# Patient Record
Sex: Female | Born: 1965
Health system: Southern US, Community
[De-identification: ages and names within clinical notes are randomized; demographics above are authoritative.]

## PROBLEM LIST (undated history)

## (undated) DIAGNOSIS — E785 Hyperlipidemia, unspecified: Secondary | ICD-10-CM

## (undated) DIAGNOSIS — M75100 Unspecified rotator cuff tear or rupture of unspecified shoulder, not specified as traumatic: Secondary | ICD-10-CM

## (undated) DIAGNOSIS — M4310 Spondylolisthesis, site unspecified: Secondary | ICD-10-CM

## (undated) DIAGNOSIS — J45909 Unspecified asthma, uncomplicated: Secondary | ICD-10-CM

## (undated) DIAGNOSIS — G43909 Migraine, unspecified, not intractable, without status migrainosus: Secondary | ICD-10-CM

## (undated) HISTORY — PX: OTHER SURGICAL HISTORY: SHX169

## (undated) HISTORY — DX: Unspecified asthma, uncomplicated: J45.909

## (undated) HISTORY — DX: Hyperlipidemia, unspecified: E78.5

## (undated) HISTORY — DX: Unspecified rotator cuff tear or rupture of unspecified shoulder, not specified as traumatic: M75.100

## (undated) HISTORY — DX: Spondylolisthesis, site unspecified: M43.10

## (undated) HISTORY — PX: CARPAL TUNNEL RELEASE: SHX101

---

## 2000-01-17 ENCOUNTER — Other Ambulatory Visit: Admission: RE | Admit: 2000-01-17 | Discharge: 2000-01-17 | Payer: Self-pay | Admitting: Internal Medicine

## 2000-07-05 ENCOUNTER — Encounter: Admission: RE | Admit: 2000-07-05 | Discharge: 2000-07-05 | Payer: Self-pay | Admitting: Occupational Medicine

## 2000-07-05 ENCOUNTER — Encounter: Payer: Self-pay | Admitting: Occupational Medicine

## 2000-11-08 ENCOUNTER — Encounter: Admission: RE | Admit: 2000-11-08 | Discharge: 2000-11-21 | Payer: Self-pay | Admitting: Family Medicine

## 2002-03-12 ENCOUNTER — Encounter: Payer: Self-pay | Admitting: Family Medicine

## 2002-03-12 ENCOUNTER — Ambulatory Visit (HOSPITAL_COMMUNITY): Admission: RE | Admit: 2002-03-12 | Discharge: 2002-03-12 | Payer: Self-pay | Admitting: Family Medicine

## 2002-11-07 ENCOUNTER — Encounter: Payer: Self-pay | Admitting: Internal Medicine

## 2002-11-07 ENCOUNTER — Ambulatory Visit (HOSPITAL_COMMUNITY): Admission: RE | Admit: 2002-11-07 | Discharge: 2002-11-07 | Payer: Self-pay | Admitting: Internal Medicine

## 2003-01-01 ENCOUNTER — Encounter: Payer: Self-pay | Admitting: Family Medicine

## 2003-01-01 ENCOUNTER — Encounter: Admission: RE | Admit: 2003-01-01 | Discharge: 2003-01-01 | Payer: Self-pay | Admitting: Family Medicine

## 2003-03-16 ENCOUNTER — Other Ambulatory Visit: Admission: RE | Admit: 2003-03-16 | Discharge: 2003-03-16 | Payer: Self-pay | Admitting: Family Medicine

## 2004-03-23 ENCOUNTER — Other Ambulatory Visit: Admission: RE | Admit: 2004-03-23 | Discharge: 2004-03-23 | Payer: Self-pay | Admitting: Family Medicine

## 2005-02-10 ENCOUNTER — Emergency Department (HOSPITAL_COMMUNITY): Admission: EM | Admit: 2005-02-10 | Discharge: 2005-02-10 | Payer: Self-pay | Admitting: Emergency Medicine

## 2005-03-31 ENCOUNTER — Other Ambulatory Visit: Admission: RE | Admit: 2005-03-31 | Discharge: 2005-03-31 | Payer: Self-pay | Admitting: Family Medicine

## 2005-08-15 ENCOUNTER — Encounter: Admission: RE | Admit: 2005-08-15 | Discharge: 2005-08-15 | Payer: Self-pay | Admitting: Family Medicine

## 2007-07-19 ENCOUNTER — Encounter: Admission: RE | Admit: 2007-07-19 | Discharge: 2007-07-19 | Payer: Self-pay | Admitting: Family Medicine

## 2009-11-05 ENCOUNTER — Encounter: Admission: RE | Admit: 2009-11-05 | Discharge: 2009-11-05 | Payer: Self-pay | Admitting: Family Medicine

## 2010-10-14 NOTE — Consult Note (Signed)
Tara [name], BOSCH               ACCOUNT NO.:  [id number]   MEDICAL RECORD NO.:  [id number]          PATIENT TYPE:  EMS   LOCATION:  ED                           FACILITY:  [facility]   PHYSICIAN:  Sandria Bales. Ezzard Standing, M.D.  DATE OF BIRTH:  1965/12/19   DATE OF CONSULTATION:  02/10/2005  DATE OF DISCHARGE:                                   CONSULTATION   HISTORY OF PRESENT ILLNESS:  Tara [name] is a 45 year old female who is a  patient of Dr. Leodis Sias but last Thursday approximately September 7  developed abdominal pain which localized in Tara [name] right lower quadrant. She  went to [facility], had lab done and ultrasound which were reported to Tara [name]  negative. On September 8, she had a CT scan in which they saw an appendolith  but no obvious inflammation of the appendix. She did have some pelvic fluid.   She has had kind of grumbly pain since then. It has never gone entirely  away. It has not gotten a lot worse. She denied any fever. She has been able  to eat. She actually ate a sandwich for lunch today. She came back to see  Dr. Modesto Charon today who is worried about Tara [name] persistent abdominal pain. He did do  a gynecological exam which is reported as negative by the patient. She has  no history of peptic ulcer disease, liver disease, pancreatic disease, colon  disease. She has had no prior abdominal surgery.   ALLERGIES:  She has no allergies.   MEDICATIONS:  She is on no medications.   REVIEW OF SYSTEMS:  NEUROLOGICAL:  No history of seizure or loss of  consciousness.  PULMONARY:  No history of pneumonia or tuberculosis.  CARDIAC:  No history of heart disease or chest pain.  GASTROINTESTINAL:  See history of present illness.  UROLOGIC:  No history of kidney stones or kidney infections.  GYNECOLOGIC:  Tara [name] last menstrual period was August 20, so she is about three  weeks out from Tara [name] period. Of note, Tara [name] pain began mid-cycle!  She has two  children, a 27 year old and a 61 year old.   She works as a  Engineer, civil (consulting) at [employer]. Tara [name] husband is an  Acupuncturist.   PHYSICAL EXAMINATION:  VITAL SIGNS:  Tara [name] temperature is 99.4, blood pressure  100/62, pulse 78, respirations 18.  HEENT:  Unremarkable.  NECK:  Supple without mass or thyromegaly.  LUNGS:  Clear to auscultation.  HEART:  Has a regular rate and rhythm without murmur or rub.  ABDOMEN:  Soft. She had some mild soreness in the right lower quadrant. She  really has no guarding or rebound. She has palpable mass, no hernia. Again,  does have active bowel sounds.  GENITOURINARY:  I did not repeat a gynecological exam on Tara [name].  EXTREMITIES:  She had grossly normal four extremities.  NEUROLOGICAL:  Grossly intact.   LABORATORY DATA:  I have labs that were done by Dr. Modesto Charon which reveals a  white blood count of 5,500, hemoglobin of 13, hematocrit 38.   DIAGNOSIS:  #1.  Recurrent persistent right lower  quadrant pain. I reviewed  Tara [name] CT scan with Dr. Signa Kell. She does have an appendolith and does  have some pelvic fluid, so it is unclear to me whether the appndix is the  source for Tara [name] pain or whether there is a gynecological source for Tara [name] pain.   I discussed with the patient the options for letting Tara [name] be discharged home  with followup either with Dr. Modesto Charon or a gynecologist or to proceed with a CT  scan. She wants  to proceed with a CT scan.   Depending on the findings of the CT scan, we will dictate our next plan. If  there is clearly appendicitis, she will need an appendectomy. If there is  clearly a gynecological source for Tara [name] pain, she will need a followup with  either Dr. Modesto Charon or a gynecologist. If it is unclear, then we will have to  discuss whether it is worth proceeding with an appendectomy at this time or  not.   She understands these options.   #2.  Hypercholesterolemia. Right now, she is treating this solely with diet.  #3.  Appendolith by CT scan done September 8 at [facility].    [Tara [name] abdominal/pelvis CT scan shows no appendiceal inflammation. (read by  Dr. Gennette Pac) She still has fluid in Tara [name] pelvis and a collapsed ight ovarian  cyst!  I think Tara [name] pain is from a ruptured corpus luteum cyst.  This fits  the timing of Tara [name] pain and the nature of Tara [name] symptoms.  If the pain  continues or recurs she will see Dr. Modesto Charon or a gynecologist of Tara [name] choice.  She has my phone numbr for further general surgical quesitons.]      Sandria Bales. Ezzard Standing, M.D.  Electronically Signed     DHN/MEDQ  D:  02/10/2005  T:  02/10/2005  Job:  [id number]   cc:   Thelma Barge P. Modesto Charon, M.D.  [address]  [place]  [place] [address]  Fax: [phone]

## 2010-10-14 NOTE — Consult Note (Signed)
Tara [name], Tara [name]               ACCOUNT NO.:  [id number]   MEDICAL RECORD NO.:  [id number]          PATIENT TYPE:  EMS   LOCATION:  ED                           FACILITY:  [facility]   PHYSICIAN:  Sandria Bales. Ezzard Standing, M.D.  DATE OF BIRTH:  01-22-1966   DATE OF CONSULTATION:  02/10/2005  DATE OF DISCHARGE:                                   CONSULTATION   ADDENDUM:  On [name], I ordered a CT scan of her abdomen and pelvis.  This  shows what appears to be a collapsed cyst of her right ovary and fluid in  her pelvis.  Her appendix is absolutely normal.  She has no other area of  intra-abdominal concern which could cause pain.   I think that [name] had a ruptured corpus luteum cyst, which was probably  mid cycle, which would  be consistent with the timing of when her pain began  and  this pain should resolve.  If this is continued or repetitive, she  ought to see Dr. Modesto Charon or see a gynecologist.  If there is any concern about  doing a laparoscope, I do not mind being available at the time of  laparoscopy for further evaluation.   She understands the findings.   FOLLOW UP:  I gave her a prescription pad with my name on it and phone #.  She is to call if there are any further problems, I really think it is all a  gynecologic problem.  I think she understands that.      Sandria Bales. Ezzard Standing, M.D.  Electronically Signed     DHN/MEDQ  D:  02/10/2005  T:  02/10/2005  Job:  [id number]   cc:   Thelma Barge P. Modesto Charon, M.D.  [address]  [place]  [place] [address]  Fax: [phone]

## 2011-05-08 ENCOUNTER — Ambulatory Visit: Payer: 59 | Attending: Family Medicine | Admitting: Rehabilitation

## 2011-05-08 DIAGNOSIS — IMO0001 Reserved for inherently not codable concepts without codable children: Secondary | ICD-10-CM | POA: Insufficient documentation

## 2011-05-08 DIAGNOSIS — M25519 Pain in unspecified shoulder: Secondary | ICD-10-CM | POA: Insufficient documentation

## 2011-05-08 DIAGNOSIS — M25619 Stiffness of unspecified shoulder, not elsewhere classified: Secondary | ICD-10-CM | POA: Insufficient documentation

## 2011-05-10 ENCOUNTER — Ambulatory Visit: Payer: 59 | Admitting: Physical Therapy

## 2011-05-16 ENCOUNTER — Ambulatory Visit: Payer: 59 | Admitting: Physical Therapy

## 2011-05-19 ENCOUNTER — Ambulatory Visit: Payer: 59 | Admitting: Physical Therapy

## 2011-05-24 ENCOUNTER — Ambulatory Visit: Payer: 59 | Admitting: Physical Therapy

## 2011-05-29 ENCOUNTER — Ambulatory Visit: Payer: 59 | Admitting: Physical Therapy

## 2011-05-31 ENCOUNTER — Ambulatory Visit: Payer: 59 | Attending: Family Medicine | Admitting: Physical Therapy

## 2011-05-31 DIAGNOSIS — M25619 Stiffness of unspecified shoulder, not elsewhere classified: Secondary | ICD-10-CM | POA: Insufficient documentation

## 2011-05-31 DIAGNOSIS — M25519 Pain in unspecified shoulder: Secondary | ICD-10-CM | POA: Insufficient documentation

## 2011-05-31 DIAGNOSIS — IMO0001 Reserved for inherently not codable concepts without codable children: Secondary | ICD-10-CM | POA: Insufficient documentation

## 2011-06-05 ENCOUNTER — Ambulatory Visit: Payer: 59 | Admitting: Physical Therapy

## 2011-06-08 ENCOUNTER — Ambulatory Visit: Payer: 59 | Admitting: Physical Therapy

## 2011-06-13 ENCOUNTER — Ambulatory Visit: Payer: 59 | Admitting: Physical Therapy

## 2011-06-15 ENCOUNTER — Ambulatory Visit: Payer: 59 | Admitting: Physical Therapy

## 2011-06-19 ENCOUNTER — Ambulatory Visit: Payer: 59 | Admitting: Physical Therapy

## 2011-08-14 ENCOUNTER — Other Ambulatory Visit (HOSPITAL_BASED_OUTPATIENT_CLINIC_OR_DEPARTMENT_OTHER): Payer: Self-pay | Admitting: Family Medicine

## 2011-08-14 DIAGNOSIS — R52 Pain, unspecified: Secondary | ICD-10-CM

## 2011-08-15 ENCOUNTER — Other Ambulatory Visit (HOSPITAL_BASED_OUTPATIENT_CLINIC_OR_DEPARTMENT_OTHER): Payer: 59

## 2011-08-19 ENCOUNTER — Ambulatory Visit (HOSPITAL_BASED_OUTPATIENT_CLINIC_OR_DEPARTMENT_OTHER)
Admission: RE | Admit: 2011-08-19 | Discharge: 2011-08-19 | Disposition: A | Discharge status: Home / Self Care | Payer: 59 | Source: Ambulatory Visit | Attending: Family Medicine | Admitting: Family Medicine

## 2011-08-19 DIAGNOSIS — M75100 Unspecified rotator cuff tear or rupture of unspecified shoulder, not specified as traumatic: Secondary | ICD-10-CM | POA: Insufficient documentation

## 2011-08-19 DIAGNOSIS — IMO0002 Reserved for concepts with insufficient information to code with codable children: Secondary | ICD-10-CM | POA: Insufficient documentation

## 2011-08-19 DIAGNOSIS — R52 Pain, unspecified: Secondary | ICD-10-CM

## 2011-08-19 DIAGNOSIS — M25519 Pain in unspecified shoulder: Secondary | ICD-10-CM | POA: Insufficient documentation

## 2011-08-19 DIAGNOSIS — M25619 Stiffness of unspecified shoulder, not elsewhere classified: Secondary | ICD-10-CM | POA: Insufficient documentation

## 2011-08-19 DIAGNOSIS — M249 Joint derangement, unspecified: Secondary | ICD-10-CM | POA: Insufficient documentation

## 2011-08-19 DIAGNOSIS — M67919 Unspecified disorder of synovium and tendon, unspecified shoulder: Secondary | ICD-10-CM

## 2011-12-20 ENCOUNTER — Encounter: Payer: 59 | Attending: "Endocrinology | Admitting: *Deleted

## 2011-12-20 DIAGNOSIS — Z713 Dietary counseling and surveillance: Secondary | ICD-10-CM

## 2011-12-26 NOTE — Progress Notes (Signed)
Patient attended the [facility]: Hypertension/High Cholesterol nutrition class on 12/20/11.  Topics covered include:   1. Complications of Hyperlipidemia and/or Hypertension. 2. Ways to reduce risk of heart disease.  3. Identifying fat and sodium content on food labels. 4. Ways to decrease sodium intake. 5. Optimal amount of daily saturated fat intake. 6. Optimal amount of daily sodium intake.  7. Foods to limit/avoid on a heart healthy diet.  Patient to follow-up with [facility] prn.

## 2012-09-06 ENCOUNTER — Ambulatory Visit: Payer: Self-pay

## 2012-09-06 ENCOUNTER — Other Ambulatory Visit: Payer: Self-pay | Admitting: Occupational Medicine

## 2012-09-06 DIAGNOSIS — R7612 Nonspecific reaction to cell mediated immunity measurement of gamma interferon antigen response without active tuberculosis: Secondary | ICD-10-CM

## 2012-11-25 ENCOUNTER — Emergency Department (HOSPITAL_BASED_OUTPATIENT_CLINIC_OR_DEPARTMENT_OTHER): Payer: PRIVATE HEALTH INSURANCE

## 2012-11-25 ENCOUNTER — Emergency Department (HOSPITAL_BASED_OUTPATIENT_CLINIC_OR_DEPARTMENT_OTHER)
Admission: EM | Admit: 2012-11-25 | Discharge: 2012-11-25 | Disposition: A | Discharge status: Home / Self Care | Payer: PRIVATE HEALTH INSURANCE | Attending: Emergency Medicine | Admitting: Emergency Medicine

## 2012-11-25 ENCOUNTER — Encounter (HOSPITAL_BASED_OUTPATIENT_CLINIC_OR_DEPARTMENT_OTHER): Payer: Self-pay

## 2012-11-25 DIAGNOSIS — Y990 Civilian activity done for income or pay: Secondary | ICD-10-CM | POA: Insufficient documentation

## 2012-11-25 DIAGNOSIS — M4310 Spondylolisthesis, site unspecified: Secondary | ICD-10-CM | POA: Insufficient documentation

## 2012-11-25 DIAGNOSIS — Y9289 Other specified places as the place of occurrence of the external cause: Secondary | ICD-10-CM | POA: Insufficient documentation

## 2012-11-25 DIAGNOSIS — Z8679 Personal history of other diseases of the circulatory system: Secondary | ICD-10-CM | POA: Insufficient documentation

## 2012-11-25 DIAGNOSIS — Y93F9 Activity, other caregiving: Secondary | ICD-10-CM | POA: Insufficient documentation

## 2012-11-25 DIAGNOSIS — M549 Dorsalgia, unspecified: Secondary | ICD-10-CM

## 2012-11-25 DIAGNOSIS — IMO0002 Reserved for concepts with insufficient information to code with codable children: Secondary | ICD-10-CM | POA: Insufficient documentation

## 2012-11-25 DIAGNOSIS — X500XXA Overexertion from strenuous movement or load, initial encounter: Secondary | ICD-10-CM | POA: Insufficient documentation

## 2012-11-25 DIAGNOSIS — M4317 Spondylolisthesis, lumbosacral region: Secondary | ICD-10-CM

## 2012-11-25 DIAGNOSIS — Z79899 Other long term (current) drug therapy: Secondary | ICD-10-CM | POA: Insufficient documentation

## 2012-11-25 HISTORY — DX: Migraine, unspecified, not intractable, without status migrainosus: G43.909

## 2012-11-25 MED ORDER — IBUPROFEN 800 MG PO TABS
800.0000 mg | ORAL_TABLET | Freq: Once | ORAL | Status: AC
  Administered 2012-11-25: 800 mg via ORAL
  Filled 2012-11-25: qty 1

## 2012-11-25 MED ORDER — CYCLOBENZAPRINE HCL 10 MG PO TABS: 10.0000 mg | ORAL_TABLET | Freq: Two times a day (BID) | ORAL | Status: DC | PRN

## 2012-11-25 MED ORDER — HYDROCODONE-ACETAMINOPHEN 5-325 MG PO TABS: 2.0000 | ORAL_TABLET | ORAL | Status: DC | PRN

## 2012-11-25 MED ORDER — PREDNISONE 20 MG PO TABS: 40.0000 mg | ORAL_TABLET | Freq: Every day | ORAL | Status: DC

## 2012-11-25 NOTE — ED Notes (Signed)
Injured back at work 6/27-cont'd to work that day-was off x 2 days-relief with ibuprofen-back to work today-c/o "popping" in lower back today-pt with steady gait to tx area

## 2012-11-25 NOTE — ED Provider Notes (Signed)
History    CSN: [id number] Arrival date & time 11/25/12  1059  First MD Initiated Contact with Patient 11/25/12 1106     Chief Complaint  Patient presents with  . 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 Pt states she felt a pop on the right side of her low back and has had associated low back muscle tightness with burning radiation to right mid thigh. Patient rates her pain 6/10. Ice, ibuprofen, and stretching have mildly alleviated pain. Flexion and extension of back aggravate pain. Denies any previous history of back pain, bladder or bowel incontinence, inability to ambulate, IVDA, history of cancer, fevers or chills.   Past Medical History  Diagnosis Date  . Migraine    History reviewed. No pertinent past surgical history. No family history on file. History  Substance Use Topics  . Smoking status: Never Smoker   . Smokeless tobacco: Not on file  . Alcohol Use: No   OB History   Grav Para Term Preterm Abortions TAB SAB Ect Mult Living                 Review of Systems  Constitutional: Negative for fever and chills.  Musculoskeletal: Positive for back pain.    Allergies  Review of patient's allergies indicates no known allergies.  Home Medications   Current Outpatient Rx  Name  Route  Sig  Dispense  Refill  . Ascorbic Acid (VITAMIN C PO)   Oral   Take by mouth.         . Rizatriptan Benzoate (MAXALT PO)   Oral   Take by mouth.         . cyclobenzaprine (FLEXERIL) 10 MG tablet   Oral   Take 1 tablet (10 mg total) by mouth 2 (two) times daily as needed for muscle spasms.   20 tablet   0   . HYDROcodone-acetaminophen (NORCO/VICODIN) 5-325 MG per tablet   Oral   Take 2 tablets by mouth every 4 (four) hours as needed for pain.   10  tablet   0   . predniSONE (DELTASONE) 20 MG tablet   Oral   Take 2 tablets (40 mg total) by mouth daily.   10 tablet   0    BP 114/73  Pulse 75  Temp(Src) 98.2 F (36.8 C) (Oral)  Resp 20  Ht 5\' 4"  (1.626 m)  Wt 152 lb (68.947 kg)  BMI 26.08 kg/m2  SpO2 96%  LMP 11/17/2012 Physical Exam  Constitutional: She is oriented to person, place, and time. She appears well-developed and well-nourished. No distress.  HENT:  Head: Normocephalic and atraumatic.  Eyes: Conjunctivae are normal.  Neck: Neck supple.  Musculoskeletal:       Lumbar back: She exhibits tenderness. She exhibits normal range of motion, no bony tenderness, no swelling, no edema, no deformity, no laceration, no spasm and normal pulse.       Back:  Able to ambulate. LE strength 5/5 bilaterally. No sensory deficit. Distal pulses intact.   Neurological: She is alert and oriented to person, place, and time.  Skin: Skin is warm and dry. She is not diaphoretic.  Psychiatric: She has a normal mood and affect.    ED Course  Procedures (including critical care time) Labs Reviewed - No data to display  Dg Lumbar Spine Complete  11/25/2012   *RADIOLOGY REPORT*  Clinical Data: 47 year old female with mid to lower back pain. Recent injury.  LUMBAR SPINE - COMPLETE 4+ VIEW  Comparison: CT abdomen and pelvis 02/10/2005.  Findings: Normal lumbar segmentation. Bone mineralization is within normal limits.  No pars fracture.  Sacral ala and SI joints within normal limits.  There is trace anterolisthesis of L4 on L5 associated with moderate L4-L5 and L5-S1 facet hypertrophy.  Disc spaces relatively preserved.  Visible lower thoracic levels intact.  IMPRESSION:    1. No acute fracture or listhesis identified in the lumbar spine.  2. Trace spondylolisthesis at L4-L5 with associated lower lumbar facet degeneration.   Original Report Authenticated By: Erskine Speed, M.D.   1. Back pain   2. Spondylolisthesis at L5-S1 level     MDM   Patient with back pain.  No neurological deficits and normal neuro exam.  Patient can walk but states is painful.  No loss of bowel or bladder control.  No concern for cauda equina.  No fever, night sweats, weight loss, h/o cancer, IVDU.  Orthopedic f/u, RICE protocol and pain medicine indicated and discussed with patient. Patient is agreeable to plan. Patient is stable at time of discharge     Jeannetta Ellis, PA-C 11/25/12 1520

## 2012-11-25 NOTE — ED Notes (Signed)
Returned from xray

## 2012-11-26 NOTE — ED Provider Notes (Signed)
Medical screening examination/treatment/procedure(s) were performed by non-physician practitioner and as supervising physician I was immediately available for consultation/collaboration.  Leilanie Rauda, MD 11/26/12 2122 

## 2012-12-24 ENCOUNTER — Ambulatory Visit: Payer: PRIVATE HEALTH INSURANCE | Attending: Family Medicine | Admitting: Physical Therapy

## 2012-12-24 DIAGNOSIS — M545 Low back pain, unspecified: Secondary | ICD-10-CM | POA: Insufficient documentation

## 2012-12-24 DIAGNOSIS — IMO0001 Reserved for inherently not codable concepts without codable children: Secondary | ICD-10-CM | POA: Insufficient documentation

## 2012-12-26 ENCOUNTER — Ambulatory Visit: Payer: PRIVATE HEALTH INSURANCE | Attending: Family Medicine

## 2012-12-26 DIAGNOSIS — M545 Low back pain, unspecified: Secondary | ICD-10-CM | POA: Diagnosis not present

## 2012-12-26 DIAGNOSIS — IMO0001 Reserved for inherently not codable concepts without codable children: Secondary | ICD-10-CM | POA: Diagnosis not present

## 2012-12-30 ENCOUNTER — Ambulatory Visit: Payer: PRIVATE HEALTH INSURANCE | Attending: Family Medicine | Admitting: Physical Therapy

## 2012-12-30 DIAGNOSIS — M545 Low back pain, unspecified: Secondary | ICD-10-CM | POA: Insufficient documentation

## 2012-12-30 DIAGNOSIS — IMO0001 Reserved for inherently not codable concepts without codable children: Secondary | ICD-10-CM | POA: Insufficient documentation

## 2013-01-07 ENCOUNTER — Ambulatory Visit: Payer: PRIVATE HEALTH INSURANCE | Attending: Family Medicine | Admitting: Physical Therapy

## 2013-01-07 DIAGNOSIS — M545 Low back pain, unspecified: Secondary | ICD-10-CM | POA: Insufficient documentation

## 2013-01-07 DIAGNOSIS — IMO0001 Reserved for inherently not codable concepts without codable children: Secondary | ICD-10-CM | POA: Insufficient documentation

## 2013-01-13 ENCOUNTER — Ambulatory Visit: Payer: PRIVATE HEALTH INSURANCE | Attending: Family Medicine | Admitting: Physical Therapy

## 2013-01-13 DIAGNOSIS — M545 Low back pain, unspecified: Secondary | ICD-10-CM | POA: Insufficient documentation

## 2013-01-13 DIAGNOSIS — M4310 Spondylolisthesis, site unspecified: Secondary | ICD-10-CM | POA: Insufficient documentation

## 2013-01-13 DIAGNOSIS — IMO0001 Reserved for inherently not codable concepts without codable children: Secondary | ICD-10-CM | POA: Insufficient documentation

## 2013-01-16 ENCOUNTER — Ambulatory Visit: Payer: PRIVATE HEALTH INSURANCE | Attending: Family Medicine | Admitting: Physical Therapy

## 2013-01-16 DIAGNOSIS — M545 Low back pain, unspecified: Secondary | ICD-10-CM | POA: Insufficient documentation

## 2013-01-16 DIAGNOSIS — IMO0001 Reserved for inherently not codable concepts without codable children: Secondary | ICD-10-CM | POA: Insufficient documentation

## 2013-01-16 DIAGNOSIS — Q762 Congenital spondylolisthesis: Secondary | ICD-10-CM | POA: Insufficient documentation

## 2013-01-21 ENCOUNTER — Ambulatory Visit: Payer: PRIVATE HEALTH INSURANCE | Attending: Family Medicine | Admitting: Physical Therapy

## 2013-01-21 DIAGNOSIS — M545 Low back pain, unspecified: Secondary | ICD-10-CM | POA: Insufficient documentation

## 2013-01-21 DIAGNOSIS — IMO0001 Reserved for inherently not codable concepts without codable children: Secondary | ICD-10-CM | POA: Insufficient documentation

## 2013-01-24 ENCOUNTER — Ambulatory Visit: Payer: PRIVATE HEALTH INSURANCE | Attending: Family Medicine | Admitting: Physical Therapy

## 2013-01-24 DIAGNOSIS — IMO0001 Reserved for inherently not codable concepts without codable children: Secondary | ICD-10-CM | POA: Insufficient documentation

## 2013-01-24 DIAGNOSIS — M545 Low back pain, unspecified: Secondary | ICD-10-CM | POA: Insufficient documentation

## 2013-01-29 ENCOUNTER — Ambulatory Visit: Payer: PRIVATE HEALTH INSURANCE | Attending: Family Medicine | Admitting: Physical Therapy

## 2013-01-29 DIAGNOSIS — M545 Low back pain, unspecified: Secondary | ICD-10-CM | POA: Insufficient documentation

## 2013-01-29 DIAGNOSIS — IMO0001 Reserved for inherently not codable concepts without codable children: Secondary | ICD-10-CM | POA: Insufficient documentation

## 2013-02-06 ENCOUNTER — Ambulatory Visit: Payer: PRIVATE HEALTH INSURANCE | Admitting: Physical Therapy

## 2013-02-10 ENCOUNTER — Ambulatory Visit: Payer: PRIVATE HEALTH INSURANCE | Attending: Family Medicine | Admitting: Physical Therapy

## 2013-02-10 DIAGNOSIS — M545 Low back pain, unspecified: Secondary | ICD-10-CM | POA: Insufficient documentation

## 2013-02-10 DIAGNOSIS — IMO0001 Reserved for inherently not codable concepts without codable children: Secondary | ICD-10-CM | POA: Insufficient documentation

## 2013-02-13 ENCOUNTER — Ambulatory Visit: Payer: PRIVATE HEALTH INSURANCE | Attending: Family Medicine | Admitting: Physical Therapy

## 2013-02-13 DIAGNOSIS — M545 Low back pain, unspecified: Secondary | ICD-10-CM | POA: Insufficient documentation

## 2013-02-18 ENCOUNTER — Ambulatory Visit: Payer: PRIVATE HEALTH INSURANCE | Attending: Family Medicine | Admitting: Physical Therapy

## 2013-02-18 DIAGNOSIS — IMO0001 Reserved for inherently not codable concepts without codable children: Secondary | ICD-10-CM | POA: Insufficient documentation

## 2013-02-18 DIAGNOSIS — M545 Low back pain, unspecified: Secondary | ICD-10-CM | POA: Insufficient documentation

## 2013-02-21 ENCOUNTER — Ambulatory Visit: Payer: PRIVATE HEALTH INSURANCE | Attending: Family Medicine | Admitting: Physical Therapy

## 2013-02-21 DIAGNOSIS — IMO0001 Reserved for inherently not codable concepts without codable children: Secondary | ICD-10-CM | POA: Insufficient documentation

## 2013-02-21 DIAGNOSIS — M545 Low back pain, unspecified: Secondary | ICD-10-CM | POA: Insufficient documentation

## 2013-02-26 ENCOUNTER — Ambulatory Visit: Payer: PRIVATE HEALTH INSURANCE | Attending: Family Medicine | Admitting: Physical Therapy

## 2013-02-26 DIAGNOSIS — M545 Low back pain, unspecified: Secondary | ICD-10-CM | POA: Insufficient documentation

## 2013-02-26 DIAGNOSIS — IMO0001 Reserved for inherently not codable concepts without codable children: Secondary | ICD-10-CM | POA: Insufficient documentation

## 2013-03-05 ENCOUNTER — Ambulatory Visit: Payer: PRIVATE HEALTH INSURANCE | Attending: Family Medicine | Admitting: Physical Therapy

## 2013-03-05 ENCOUNTER — Encounter: Payer: Self-pay | Admitting: Internal Medicine

## 2013-03-05 ENCOUNTER — Ambulatory Visit (INDEPENDENT_AMBULATORY_CARE_PROVIDER_SITE_OTHER): Payer: 59 | Admitting: Internal Medicine

## 2013-03-05 ENCOUNTER — Encounter: Payer: Self-pay | Admitting: *Deleted

## 2013-03-05 VITALS — BP 111/77 | HR 81 | Temp 97.5°F | Resp 18 | Ht 64.0 in | Wt 153.0 lb

## 2013-03-05 DIAGNOSIS — M75100 Unspecified rotator cuff tear or rupture of unspecified shoulder, not specified as traumatic: Secondary | ICD-10-CM

## 2013-03-05 DIAGNOSIS — M545 Low back pain, unspecified: Secondary | ICD-10-CM | POA: Insufficient documentation

## 2013-03-05 DIAGNOSIS — E785 Hyperlipidemia, unspecified: Secondary | ICD-10-CM | POA: Insufficient documentation

## 2013-03-05 DIAGNOSIS — J45909 Unspecified asthma, uncomplicated: Secondary | ICD-10-CM

## 2013-03-05 DIAGNOSIS — IMO0001 Reserved for inherently not codable concepts without codable children: Secondary | ICD-10-CM | POA: Insufficient documentation

## 2013-03-05 DIAGNOSIS — Z8669 Personal history of other diseases of the nervous system and sense organs: Secondary | ICD-10-CM

## 2013-03-05 DIAGNOSIS — G43109 Migraine with aura, not intractable, without status migrainosus: Secondary | ICD-10-CM | POA: Insufficient documentation

## 2013-03-05 DIAGNOSIS — M67919 Unspecified disorder of synovium and tendon, unspecified shoulder: Secondary | ICD-10-CM

## 2013-03-05 NOTE — Progress Notes (Signed)
  Subjective:    Patient ID: Tara [name], female    DOB: 1965-06-10, 47 y.o.   MRN: [id number]  HPI New pt here for first visit for primary care.    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  Migraine long history of migraines worsened by OC's but now only 3-4 episodes per year.  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  I not she had a normal head CT in 2004 and she has been evaluarted years ago by [facility] in the past.  Headaches usually controlled with Maxalt for abortive treatment  Allergies  Allergen Reactions  . Tape    Past Medical History  Diagnosis Date  . Migraine    History reviewed. No pertinent past surgical history. History   Social History  . Marital Status: Married    Spouse Name: N/A    Number of Children: N/A  . Years of Education: N/A   Occupational History  . Not on file.   Social History Main Topics  . Smoking status: Never Smoker   . Smokeless tobacco: Not on file  . Alcohol Use: No  . Drug Use: No  . Sexual Activity: Not on file   Other Topics Concern  . Not on file   Social History Narrative  . No narrative on file   No family history on file. There are no active problems to display for this patient.  Current Outpatient Prescriptions on File Prior to Visit  Medication Sig Dispense Refill  . Ascorbic Acid (VITAMIN C PO) Take by mouth.      . cyclobenzaprine (FLEXERIL) 10 MG tablet Take 1 tablet (10 mg total) by mouth 2 (two) times daily as needed for muscle spasms.  20 tablet  0  . Rizatriptan Benzoate (MAXALT PO) Take by mouth.       No current facility-administered medications on file prior to visit.      Review of Systems    see HPI Objective:   Physical Exam Physical Exam  Nursing note and vitals reviewed.  Constitutional: She is oriented to person, place, and time. She appears well-developed and well-nourished.   HENT:  Head: Normocephalic and atraumatic.  Cardiovascular: Normal rate and regular rhythm. Exam reveals no gallop and no friction rub.  No murmur heard.  Pulmonary/Chest: Breath sounds normal. She has no wheezes. She has no rales.  Neurological: She is alert and oriented to person, place, and time.  Skin: Skin is warm and dry.  Psychiatric: She has a normal mood and affect. Her behavior is normal.        Assessment & Plan:  Migraines:   She is to notify me if any worsening or more frequent episodes.  She has been evaluated in the past by [facility]   Hyperlipiedemia  Will need past results.  She has a CPE scheduled in January  History of asthmatic bronchitis  Had influenza

## 2013-03-05 NOTE — Patient Instructions (Signed)
Activate my chart  Schedule CPE

## 2013-03-06 ENCOUNTER — Other Ambulatory Visit: Payer: Self-pay | Admitting: *Deleted

## 2013-03-07 ENCOUNTER — Ambulatory Visit: Payer: PRIVATE HEALTH INSURANCE | Attending: Family Medicine | Admitting: Physical Therapy

## 2013-03-07 DIAGNOSIS — M545 Low back pain, unspecified: Secondary | ICD-10-CM | POA: Insufficient documentation

## 2013-03-07 DIAGNOSIS — IMO0001 Reserved for inherently not codable concepts without codable children: Secondary | ICD-10-CM | POA: Insufficient documentation

## 2013-03-10 ENCOUNTER — Ambulatory Visit: Payer: Worker's Compensation | Admitting: Physical Therapy

## 2013-03-13 ENCOUNTER — Ambulatory Visit: Payer: PRIVATE HEALTH INSURANCE | Attending: Family Medicine | Admitting: Physical Therapy

## 2013-03-13 DIAGNOSIS — IMO0001 Reserved for inherently not codable concepts without codable children: Secondary | ICD-10-CM | POA: Insufficient documentation

## 2013-03-13 DIAGNOSIS — M545 Low back pain, unspecified: Secondary | ICD-10-CM | POA: Insufficient documentation

## 2013-03-17 ENCOUNTER — Ambulatory Visit: Payer: PRIVATE HEALTH INSURANCE | Attending: Family Medicine | Admitting: Physical Therapy

## 2013-03-17 DIAGNOSIS — M545 Low back pain, unspecified: Secondary | ICD-10-CM | POA: Insufficient documentation

## 2013-03-17 DIAGNOSIS — IMO0001 Reserved for inherently not codable concepts without codable children: Secondary | ICD-10-CM | POA: Insufficient documentation

## 2013-03-19 ENCOUNTER — Ambulatory Visit: Payer: PRIVATE HEALTH INSURANCE | Attending: Family Medicine | Admitting: Physical Therapy

## 2013-03-19 DIAGNOSIS — M545 Low back pain, unspecified: Secondary | ICD-10-CM | POA: Insufficient documentation

## 2013-03-19 DIAGNOSIS — IMO0001 Reserved for inherently not codable concepts without codable children: Secondary | ICD-10-CM | POA: Insufficient documentation

## 2013-03-25 ENCOUNTER — Ambulatory Visit: Payer: PRIVATE HEALTH INSURANCE | Attending: Family Medicine | Admitting: Physical Therapy

## 2013-03-25 DIAGNOSIS — M545 Low back pain, unspecified: Secondary | ICD-10-CM | POA: Insufficient documentation

## 2013-03-25 DIAGNOSIS — IMO0001 Reserved for inherently not codable concepts without codable children: Secondary | ICD-10-CM | POA: Insufficient documentation

## 2013-03-26 ENCOUNTER — Ambulatory Visit: Payer: PRIVATE HEALTH INSURANCE | Attending: Family Medicine | Admitting: Physical Therapy

## 2013-03-26 DIAGNOSIS — IMO0001 Reserved for inherently not codable concepts without codable children: Secondary | ICD-10-CM | POA: Insufficient documentation

## 2013-03-26 DIAGNOSIS — M545 Low back pain, unspecified: Secondary | ICD-10-CM | POA: Insufficient documentation

## 2013-03-31 ENCOUNTER — Ambulatory Visit: Payer: PRIVATE HEALTH INSURANCE | Attending: Family Medicine | Admitting: Physical Therapy

## 2013-03-31 ENCOUNTER — Other Ambulatory Visit (HOSPITAL_COMMUNITY): Payer: Self-pay | Admitting: Orthopedic Surgery

## 2013-03-31 DIAGNOSIS — M545 Low back pain, unspecified: Secondary | ICD-10-CM | POA: Insufficient documentation

## 2013-03-31 DIAGNOSIS — IMO0001 Reserved for inherently not codable concepts without codable children: Secondary | ICD-10-CM | POA: Insufficient documentation

## 2013-03-31 DIAGNOSIS — M549 Dorsalgia, unspecified: Secondary | ICD-10-CM

## 2013-04-01 ENCOUNTER — Ambulatory Visit (HOSPITAL_COMMUNITY)
Admission: RE | Admit: 2013-04-01 | Discharge: 2013-04-01 | Disposition: A | Discharge status: Home / Self Care | Payer: PRIVATE HEALTH INSURANCE | Source: Ambulatory Visit | Attending: Orthopedic Surgery | Admitting: Orthopedic Surgery

## 2013-04-01 ENCOUNTER — Other Ambulatory Visit (HOSPITAL_COMMUNITY): Payer: Self-pay | Admitting: Surgery

## 2013-04-01 DIAGNOSIS — M6740 Ganglion, unspecified site: Secondary | ICD-10-CM | POA: Insufficient documentation

## 2013-04-01 DIAGNOSIS — M549 Dorsalgia, unspecified: Secondary | ICD-10-CM

## 2013-04-01 DIAGNOSIS — M5126 Other intervertebral disc displacement, lumbar region: Secondary | ICD-10-CM | POA: Insufficient documentation

## 2013-04-01 DIAGNOSIS — M545 Low back pain, unspecified: Secondary | ICD-10-CM | POA: Insufficient documentation

## 2013-04-01 DIAGNOSIS — X500XXA Overexertion from strenuous movement or load, initial encounter: Secondary | ICD-10-CM | POA: Insufficient documentation

## 2013-04-01 DIAGNOSIS — M47817 Spondylosis without myelopathy or radiculopathy, lumbosacral region: Secondary | ICD-10-CM | POA: Insufficient documentation

## 2013-04-01 DIAGNOSIS — M79609 Pain in unspecified limb: Secondary | ICD-10-CM | POA: Insufficient documentation

## 2013-04-03 ENCOUNTER — Inpatient Hospital Stay (HOSPITAL_COMMUNITY): Admission: RE | Admit: 2013-04-03 | Payer: Self-pay | Source: Ambulatory Visit

## 2013-04-03 ENCOUNTER — Ambulatory Visit: Payer: Worker's Compensation | Admitting: Physical Therapy

## 2013-04-07 ENCOUNTER — Telehealth: Payer: Self-pay | Admitting: *Deleted

## 2013-04-07 ENCOUNTER — Ambulatory Visit: Payer: PRIVATE HEALTH INSURANCE | Attending: Family Medicine | Admitting: Physical Therapy

## 2013-04-07 DIAGNOSIS — M545 Low back pain, unspecified: Secondary | ICD-10-CM | POA: Insufficient documentation

## 2013-04-07 DIAGNOSIS — IMO0001 Reserved for inherently not codable concepts without codable children: Secondary | ICD-10-CM | POA: Insufficient documentation

## 2013-04-07 MED ORDER — FISH OIL 1000 MG PO CPDR: 2.0000 | DELAYED_RELEASE_CAPSULE | Freq: Every day | ORAL | Status: DC

## 2013-04-07 NOTE — Telephone Encounter (Signed)
Bobbie  I need to know what does she is taking and is she taking once a day or BID

## 2013-04-07 NOTE — Telephone Encounter (Signed)
Any OTC and once a day no specific brand

## 2013-04-07 NOTE — Telephone Encounter (Signed)
Pt would like a rx for fish oil so that she can use her [employer]

## 2013-04-09 ENCOUNTER — Other Ambulatory Visit: Payer: Self-pay | Admitting: *Deleted

## 2013-04-09 ENCOUNTER — Telehealth: Payer: Self-pay | Admitting: *Deleted

## 2013-04-09 NOTE — Telephone Encounter (Signed)
Tara [name] called and left a message saying that the dosage of fish oil that was called in was not the dose she needed.  She needs the smaller dose.  Please call her; she wants to know if you can change the Rx.

## 2013-04-10 ENCOUNTER — Ambulatory Visit: Payer: PRIVATE HEALTH INSURANCE | Attending: Family Medicine | Admitting: Physical Therapy

## 2013-04-10 DIAGNOSIS — IMO0001 Reserved for inherently not codable concepts without codable children: Secondary | ICD-10-CM | POA: Insufficient documentation

## 2013-04-10 DIAGNOSIS — M25519 Pain in unspecified shoulder: Secondary | ICD-10-CM | POA: Insufficient documentation

## 2013-04-14 ENCOUNTER — Other Ambulatory Visit: Payer: Self-pay | Admitting: *Deleted

## 2013-04-14 NOTE — Telephone Encounter (Signed)
Pt requesting a lower dose of fish oil and smaller cap pharmacy states that 1000mg  is standard dosage and that they can special order a lower dose. LVM message for pt to return call

## 2013-04-14 NOTE — Telephone Encounter (Signed)
Per pharmacy and pt pt is requesting Natures Made Fish oil pearls 500 mg BID. Called in this request per Dr Constance Goltz VO

## 2013-04-16 ENCOUNTER — Ambulatory Visit: Payer: PRIVATE HEALTH INSURANCE | Attending: Family Medicine | Admitting: Physical Therapy

## 2013-04-16 ENCOUNTER — Telehealth: Payer: Self-pay | Admitting: *Deleted

## 2013-04-16 DIAGNOSIS — M545 Low back pain, unspecified: Secondary | ICD-10-CM | POA: Insufficient documentation

## 2013-04-16 DIAGNOSIS — IMO0001 Reserved for inherently not codable concepts without codable children: Secondary | ICD-10-CM | POA: Insufficient documentation

## 2013-04-17 NOTE — Telephone Encounter (Signed)
Pt fish oil called in to [facility]

## 2013-04-18 ENCOUNTER — Other Ambulatory Visit: Payer: Self-pay | Admitting: *Deleted

## 2013-04-18 DIAGNOSIS — Z139 Encounter for screening, unspecified: Secondary | ICD-10-CM

## 2013-04-18 LAB — CBC WITH DIFFERENTIAL/PLATELET
Basophils Absolute: 0 10*3/uL (ref 0.0–0.1)
Basophils Relative: 1 % (ref 0–1)
Eosinophils Absolute: 0.5 10*3/uL (ref 0.0–0.7)
Eosinophils Relative: 10 % — ABNORMAL HIGH (ref 0–5)
HCT: 37.7 % (ref 36.0–46.0)
Hemoglobin: 12.5 g/dL (ref 12.0–15.0)
Lymphocytes Relative: 35 % (ref 12–46)
Lymphs Abs: 1.7 10*3/uL (ref 0.7–4.0)
MCH: 30.3 pg (ref 26.0–34.0)
MCHC: 33.2 g/dL (ref 30.0–36.0)
MCV: 91.3 fL (ref 78.0–100.0)
Monocytes Absolute: 0.3 10*3/uL (ref 0.1–1.0)
Monocytes Relative: 6 % (ref 3–12)
Neutro Abs: 2.4 10*3/uL (ref 1.7–7.7)
Neutrophils Relative %: 48 % (ref 43–77)
Platelets: 422 10*3/uL — ABNORMAL HIGH (ref 150–400)
RBC: 4.13 MIL/uL (ref 3.87–5.11)
RDW: 13.1 % (ref 11.5–15.5)
WBC: 4.9 10*3/uL (ref 4.0–10.5)

## 2013-04-18 LAB — COMPREHENSIVE METABOLIC PANEL
ALT: 24 U/L (ref 0–35)
AST: 19 U/L (ref 0–37)
Albumin: 4.3 g/dL (ref 3.5–5.2)
Alkaline Phosphatase: 43 U/L (ref 39–117)
BUN: 13 mg/dL (ref 6–23)
CO2: 26 mEq/L (ref 19–32)
Calcium: 10 mg/dL (ref 8.4–10.5)
Chloride: 102 mEq/L (ref 96–112)
Creat: 0.78 mg/dL (ref 0.50–1.10)
Glucose, Bld: 88 mg/dL (ref 70–99)
Potassium: 4.5 mEq/L (ref 3.5–5.3)
Sodium: 138 mEq/L (ref 135–145)
Total Bilirubin: 0.6 mg/dL (ref 0.3–1.2)
Total Protein: 7.1 g/dL (ref 6.0–8.3)

## 2013-04-18 LAB — LIPID PANEL
Cholesterol: 211 mg/dL — ABNORMAL HIGH (ref 0–200)
HDL: 48 mg/dL (ref >39)
LDL Cholesterol: 146 mg/dL — ABNORMAL HIGH (ref 0–99)
Total CHOL/HDL Ratio: 4.4 Ratio
Triglycerides: 85 mg/dL (ref <150)
VLDL: 17 mg/dL (ref 0–40)

## 2013-04-18 LAB — TSH: TSH: 1.719 u[IU]/mL (ref 0.350–4.500)

## 2013-04-19 LAB — VITAMIN D 25 HYDROXY (VIT D DEFICIENCY, FRACTURES): Vit D, 25-Hydroxy: 26 ng/mL — ABNORMAL LOW (ref 30–89)

## 2013-04-21 ENCOUNTER — Ambulatory Visit: Payer: PRIVATE HEALTH INSURANCE | Admitting: Physical Therapy

## 2013-04-22 ENCOUNTER — Ambulatory Visit: Payer: PRIVATE HEALTH INSURANCE | Attending: Family Medicine | Admitting: Physical Therapy

## 2013-04-22 DIAGNOSIS — IMO0001 Reserved for inherently not codable concepts without codable children: Secondary | ICD-10-CM | POA: Insufficient documentation

## 2013-04-22 DIAGNOSIS — M25519 Pain in unspecified shoulder: Secondary | ICD-10-CM | POA: Insufficient documentation

## 2013-04-23 ENCOUNTER — Ambulatory Visit: Payer: PRIVATE HEALTH INSURANCE | Admitting: Physical Therapy

## 2013-04-28 ENCOUNTER — Encounter: Payer: Self-pay | Admitting: Physical Therapy

## 2013-04-30 ENCOUNTER — Ambulatory Visit: Payer: PRIVATE HEALTH INSURANCE | Attending: Family Medicine | Admitting: Physical Therapy

## 2013-04-30 DIAGNOSIS — IMO0001 Reserved for inherently not codable concepts without codable children: Secondary | ICD-10-CM | POA: Insufficient documentation

## 2013-04-30 DIAGNOSIS — M25519 Pain in unspecified shoulder: Secondary | ICD-10-CM | POA: Insufficient documentation

## 2013-05-01 ENCOUNTER — Ambulatory Visit: Payer: PRIVATE HEALTH INSURANCE | Attending: Family Medicine | Admitting: Physical Therapy

## 2013-05-01 DIAGNOSIS — M25519 Pain in unspecified shoulder: Secondary | ICD-10-CM | POA: Diagnosis not present

## 2013-05-01 DIAGNOSIS — IMO0001 Reserved for inherently not codable concepts without codable children: Secondary | ICD-10-CM | POA: Insufficient documentation

## 2013-05-02 ENCOUNTER — Telehealth: Payer: Self-pay | Admitting: *Deleted

## 2013-05-02 NOTE — Telephone Encounter (Signed)
Pt would like for [place] to fill a form for massage therapy pt will bring form to office

## 2013-05-05 ENCOUNTER — Ambulatory Visit: Payer: PRIVATE HEALTH INSURANCE | Attending: Family Medicine | Admitting: Physical Therapy

## 2013-05-05 DIAGNOSIS — IMO0001 Reserved for inherently not codable concepts without codable children: Secondary | ICD-10-CM | POA: Insufficient documentation

## 2013-05-05 DIAGNOSIS — M545 Low back pain, unspecified: Secondary | ICD-10-CM | POA: Insufficient documentation

## 2013-05-07 ENCOUNTER — Ambulatory Visit: Payer: PRIVATE HEALTH INSURANCE | Attending: Family Medicine | Admitting: Physical Therapy

## 2013-05-07 DIAGNOSIS — IMO0001 Reserved for inherently not codable concepts without codable children: Secondary | ICD-10-CM | POA: Insufficient documentation

## 2013-05-07 DIAGNOSIS — M545 Low back pain, unspecified: Secondary | ICD-10-CM | POA: Insufficient documentation

## 2013-05-08 ENCOUNTER — Telehealth: Payer: Self-pay | Admitting: *Deleted

## 2013-05-08 NOTE — Telephone Encounter (Signed)
Letter of medical necessity mailed to pt home address

## 2013-06-19 ENCOUNTER — Ambulatory Visit (INDEPENDENT_AMBULATORY_CARE_PROVIDER_SITE_OTHER): Payer: 59 | Admitting: Internal Medicine

## 2013-06-19 ENCOUNTER — Encounter: Payer: Self-pay | Admitting: Internal Medicine

## 2013-06-19 VITALS — BP 116/73 | HR 71 | Resp 16 | Ht 64.0 in | Wt 157.0 lb

## 2013-06-19 DIAGNOSIS — N912 Amenorrhea, unspecified: Secondary | ICD-10-CM

## 2013-06-19 DIAGNOSIS — E785 Hyperlipidemia, unspecified: Secondary | ICD-10-CM

## 2013-06-19 DIAGNOSIS — Q619 Cystic kidney disease, unspecified: Secondary | ICD-10-CM

## 2013-06-19 DIAGNOSIS — Z139 Encounter for screening, unspecified: Secondary | ICD-10-CM

## 2013-06-19 DIAGNOSIS — Z0000 Encounter for general adult medical examination without abnormal findings: Secondary | ICD-10-CM

## 2013-06-19 DIAGNOSIS — N281 Cyst of kidney, acquired: Secondary | ICD-10-CM | POA: Insufficient documentation

## 2013-06-19 DIAGNOSIS — G43909 Migraine, unspecified, not intractable, without status migrainosus: Secondary | ICD-10-CM

## 2013-06-19 LAB — POCT URINALYSIS DIPSTICK
Bilirubin, UA: NEGATIVE
Blood, UA: NEGATIVE
Glucose, UA: NEGATIVE
Ketones, UA: NEGATIVE
Leukocytes, UA: NEGATIVE
Nitrite, UA: NEGATIVE
Protein, UA: NEGATIVE
Spec Grav, UA: 1.02
Urobilinogen, UA: 0.2
pH, UA: 5

## 2013-06-19 LAB — HEPATIC FUNCTION PANEL
ALT: 27 U/L (ref 0–35)
AST: 22 U/L (ref 0–37)
Albumin: 4.5 g/dL (ref 3.5–5.2)
Alkaline Phosphatase: 47 U/L (ref 39–117)
Bilirubin, Direct: 0.1 mg/dL (ref 0.0–0.3)
Indirect Bilirubin: 0.3 mg/dL (ref 0.0–0.9)
Total Bilirubin: 0.4 mg/dL (ref 0.3–1.2)
Total Protein: 7.6 g/dL (ref 6.0–8.3)

## 2013-06-19 LAB — POCT URINE PREGNANCY: Preg Test, Ur: NEGATIVE

## 2013-06-19 NOTE — Progress Notes (Signed)
Subjective:    Patient ID: Tara [name], female    DOB: [birth year], 48 y.o.   MRN: [id number]  HPI  Tara [name] is here for CPE        She reports she is having missed menses.  No hot flushes. She would like a pregnancy test.   She also reports a work related injury and is [facility]  See MRI 11/14.  Small L4-L5 disc protrusion    See lipids.  Pt reports last lipid was 177  .  She is taking red yeast rice. For the last few months  She also has 8 mm renal  Cyst in Left kidney  She does not wish a pap today She reports all her pap smear tests have been normal she had one last year at [facility].    Allergies  Allergen Reactions  . Tape    Past Medical History  Diagnosis Date  . Migraine   . Rotator cuff tear    History reviewed. No pertinent past surgical history. History   Social History  . Marital Status: Married    Spouse Name: N/A    Number of Children: N/A  . Years of Education: N/A   Occupational History  . Not on file.   Social History Main Topics  . Smoking status: Never Smoker   . Smokeless tobacco: Never Used  . Alcohol Use: No  . Drug Use: No  . Sexual Activity: Yes    Birth Control/ Protection: None   Other Topics Concern  . Not on file   Social History Narrative  . No narrative on file   Family History  Problem Relation Age of Onset  . Heart disease Father [id number]    MI  . Hyperlipidemia Brother   . Hypertension Maternal Grandmother    Patient Active Problem List   Diagnosis Date Noted  . History of migraine headaches 03/05/2013  . 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 Hyperlipidemia 03/05/2013  . 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 Rotator cuff syndrome 03/05/2013  . Low back pain 03/05/2013   Current Outpatient Prescriptions on File Prior to Visit  Medication Sig Dispense Refill  . Ascorbic Acid (VITAMIN C PO) Take by mouth.      . cyclobenzaprine (FLEXERIL) 10 MG tablet Take 1 tablet (10 mg total)  by mouth 2 (two) times daily as needed for muscle spasms.  20 tablet  0  . Rizatriptan Benzoate (MAXALT PO) Take by mouth.      . fish oil-omega-3 fatty acids 1000 MG capsule Take 1 g by mouth daily.       No current facility-administered medications on file prior to visit.      Review of Systems  All other systems reviewed and are negative.       Objective:   Physical Exam  Physical Exam  Nursing note and vitals reviewed.  Constitutional: She is oriented to person, place, and time. She appears well-developed and well-nourished.  HENT:  Head: Normocephalic and atraumatic.  Right Ear: Tympanic membrane and ear canal normal. No drainage. Tympanic membrane is not injected and not erythematous.  Left Ear: Tympanic membrane and ear canal normal. No drainage. Tympanic membrane is not injected and not erythematous.  Nose: Nose normal. Right sinus exhibits no maxillary sinus tenderness and no frontal sinus tenderness. Left sinus exhibits no maxillary sinus tenderness and no frontal sinus tenderness.  Mouth/Throat: Oropharynx is clear and moist. No oral lesions. No oropharyngeal exudate.  Eyes:  Conjunctivae and EOM are normal. Pupils are equal, round, and reactive to light.  Neck: Normal range of motion. Neck supple. No JVD present. Carotid bruit is not present. No mass and no thyromegaly present.  Cardiovascular: Normal rate, regular rhythm, S1 normal, S2 normal and intact distal pulses. Exam reveals no gallop and no friction rub.  No murmur heard.  Pulses:  Carotid pulses are 2+ on the right side, and 2+ on the left side.  Dorsalis pedis pulses are 2+ on the right side, and 2+ on the left side.  No carotid bruit. No LE edema  Pulmonary/Chest: Breath sounds normal. She has no wheezes. She has no rales. She exhibits no tenderness.   Breast no discrete masses no nipple discharge no axillary adenopathy bilaterally Abdominal: Soft. Bowel sounds are normal. She exhibits no distension and no  mass. There is no hepatosplenomegaly. There is no tenderness. There is no CVA tenderness.  Musculoskeletal: Normal range of motion.  No active synovitis to joints.  Lymphadenopathy:  She has no cervical adenopathy.  She has no axillary adenopathy.  Right: No inguinal and no supraclavicular adenopathy present.  Left: No inguinal and no supraclavicular adenopathy present.  Neurological: She is alert and oriented to person, place, and time. She has normal strength and normal reflexes. She displays no tremor. No cranial nerve deficit or sensory deficit. Coordination and gait normal.  Skin: Skin is warm and dry. No rash noted. No cyanosis. Nails show no clubbing.  Psychiatric: She has a normal mood and affect. Her speech is normal and behavior is normal. Cognition and memory are normal.         Assessment & Plan:  Health Maintenance:  Her last mm was in 2011.  Will set up at the breast center.  Advised 3D  Advised pt to send her last pap report to me as she declined pap today.  States pap was done 05/2012  Hyperlipidemia  Improved  She in on red yeast rice.  Counseled pt that red yeast rice is similar in chemical compositition to pravachol.  Will check hepatic profile today  Menstrual irregularity  Likely peri-menopausal. preg test negative  MIgraine headache continue Maxalt prn  Left renal cyst  See me as needed

## 2013-06-19 NOTE — Addendum Note (Signed)
Addended by: Raechel ChuteSCHOENHOFF, Lorenza Winkleman D on: 06/19/2013 04:18 PM   Modules accepted: Orders

## 2013-06-23 ENCOUNTER — Encounter: Payer: Self-pay | Admitting: *Deleted

## 2013-06-25 ENCOUNTER — Other Ambulatory Visit: Payer: Self-pay | Admitting: Internal Medicine

## 2013-06-25 DIAGNOSIS — Z1231 Encounter for screening mammogram for malignant neoplasm of breast: Secondary | ICD-10-CM

## 2013-06-30 ENCOUNTER — Encounter: Payer: Self-pay | Admitting: *Deleted

## 2013-07-08 ENCOUNTER — Ambulatory Visit: Payer: 59

## 2013-07-11 ENCOUNTER — Other Ambulatory Visit: Payer: Self-pay | Admitting: Internal Medicine

## 2013-07-11 ENCOUNTER — Ambulatory Visit: Admission: RE | Admit: 2013-07-11 | Payer: 59 | Source: Ambulatory Visit

## 2013-07-11 ENCOUNTER — Ambulatory Visit
Admission: RE | Admit: 2013-07-11 | Discharge: 2013-07-11 | Disposition: A | Discharge status: Home / Self Care | Payer: 59 | Source: Ambulatory Visit | Attending: Internal Medicine | Admitting: Internal Medicine

## 2013-07-11 DIAGNOSIS — Z1231 Encounter for screening mammogram for malignant neoplasm of breast: Secondary | ICD-10-CM

## 2013-07-15 ENCOUNTER — Ambulatory Visit: Payer: PRIVATE HEALTH INSURANCE

## 2013-07-18 ENCOUNTER — Ambulatory Visit: Payer: PRIVATE HEALTH INSURANCE

## 2013-08-05 ENCOUNTER — Ambulatory Visit: Payer: PRIVATE HEALTH INSURANCE | Attending: Orthopedic Surgery

## 2013-08-05 DIAGNOSIS — M545 Low back pain, unspecified: Secondary | ICD-10-CM | POA: Diagnosis not present

## 2013-08-05 DIAGNOSIS — IMO0001 Reserved for inherently not codable concepts without codable children: Secondary | ICD-10-CM | POA: Diagnosis present

## 2013-10-14 ENCOUNTER — Other Ambulatory Visit: Payer: Self-pay | Admitting: *Deleted

## 2013-10-14 NOTE — Telephone Encounter (Signed)
Refill request

## 2013-10-15 MED ORDER — FISH OIL 500 MG PO CAPS: ORAL_CAPSULE | ORAL | Status: AC

## 2014-01-12 ENCOUNTER — Telehealth: Payer: Self-pay | Admitting: *Deleted

## 2014-01-12 NOTE — Telephone Encounter (Signed)
Going on cruise and would like the patch for nausea/ motion sickness called into pharmacy.

## 2014-01-15 ENCOUNTER — Telehealth: Payer: Self-pay | Admitting: *Deleted

## 2014-01-15 ENCOUNTER — Other Ambulatory Visit: Payer: Self-pay | Admitting: Internal Medicine

## 2014-01-15 MED ORDER — SCOPOLAMINE 1 MG/3DAYS TD PT72: 1.0000 | MEDICATED_PATCH | TRANSDERMAL | Status: DC

## 2014-01-15 NOTE — Telephone Encounter (Signed)
Pt had called on 8/17( see encounter) requesting a rx for motion sickness for her & her daughter.Pt states that there must have been a mix up at Pharm cause daughter received her's but there was not 1 for her .Could you please resend?

## 2014-03-30 ENCOUNTER — Encounter: Payer: Self-pay | Admitting: Internal Medicine

## 2014-06-21 NOTE — Progress Notes (Signed)
Subjective:    Patient ID: Tara [name], female    DOB: 02/04/1966, 49 y.o.   MRN: [id number]  HPI 05/2013 note Health Maintenance: Her last mm was in 2011. Will set up at the [facility] center. Advised 3D Advised pt to send her last pap report to me as she declined pap today. States pap was done 05/2012  Hyperlipidemia Improved She in on red yeast rice. Counseled pt that red yeast rice is similar in chemical compositition to pravachol. Will check hepatic profile today  Menstrual irregularity Likely peri-menopausal. preg test negative  MIgraine headache continue Maxalt prn  Left renal cyst  See me as needed    TODAY:  Tara [name] is here for CPE HM:  Needs pap today  Mm done 06/2013,  She is non-smoker  Last menses 10/2013   Migrianes less frequent since stopping OC's   Hyperlipidemia  She has been using red yeast rice  She is on worker's comp for lumbar pain  She has seen Dr. Shon BatonBrooks orthopedic for this   Allergies  Allergen Reactions  . Tape    Past Medical History  Diagnosis Date  . Migraine   . Rotator cuff tear    No past surgical history on file. History   Social History  . Marital Status: Married    Spouse Name: N/A    Number of Children: N/A  . Years of Education: N/A   Occupational History  . Not on file.   Social History Main Topics  . Smoking status: Never Smoker   . Smokeless tobacco: Never Used  . Alcohol Use: No  . Drug Use: No  . Sexual Activity: Yes    Birth Control/ Protection: None   Other Topics Concern  . Not on file   Social History Narrative   Family History  Problem Relation Age of Onset  . Heart disease Father 2634    MI  . Hyperlipidemia Brother   . Hypertension Maternal Grandmother    Patient Active Problem List   Diagnosis Date Noted  . Renal cyst, left 06/19/2013  . History of migraine headaches 03/05/2013  . 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 Hyperlipidemia 03/05/2013  . 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  Rotator cuff syndrome 03/05/2013  . Low back pain 03/05/2013   Current Outpatient Prescriptions on File Prior to Visit  Medication Sig Dispense Refill  . Ascorbic Acid (VITAMIN C PO) Take by mouth.    . cyclobenzaprine (FLEXERIL) 10 MG tablet Take 1 tablet (10 mg total) by mouth 2 (two) times daily as needed for muscle spasms. 20 tablet 0  . Omega-3 Fatty Acids (FISH OIL) 500 MG CAPS Natures made Fish oil pearls 500mg  BID 180 capsule 1  . Red Yeast Rice 600 MG CAPS Take 600 mg by mouth daily.    . Rizatriptan Benzoate (MAXALT PO) Take by mouth.    Marland Kitchen. scopolamine (TRANSDERM-SCOP) 1 MG/3DAYS Place 1 patch (1.5 mg total) onto the skin every 3 (three) days. 3 patch 0   No current facility-administered medications on file prior to visit.      Review of Systems  Respiratory: Negative for choking, chest tightness and shortness of breath.   Cardiovascular: Negative for chest pain, palpitations and leg swelling.  All other systems reviewed and are negative.      Objective:   Physical Exam  Physical Exam  Vital signs and nursing note reviewed  Constitutional: She is oriented to person, place, and time. She appears well-developed and well-nourished. She is cooperative.  HENT:  Head: Normocephalic and atraumatic.  Right Ear: Tympanic membrane normal.  Left Ear: Tympanic membrane normal.  Nose: Nose normal.  Mouth/Throat: Oropharynx is clear and moist and mucous membranes are normal. No oropharyngeal exudate or posterior oropharyngeal erythema.  Eyes: Conjunctivae and EOM are normal. Pupils are equal, round, and reactive to light.  Neck: Neck supple. No JVD present. Carotid bruit is not present. No mass and no thyromegaly present.  Cardiovascular: Regular rhythm, normal heart sounds, intact distal pulses and normal pulses.  Exam reveals no gallop and no friction rub.   No murmur heard. Pulses:      Dorsalis pedis pulses are 2+ on the right side, and 2+ on the left side.  Pulmonary/Chest:  Breath sounds normal. She has no wheezes. She has no rhonchi. She has no rales. Right breast exhibits no mass, no nipple discharge and no skin change. Left breast exhibits no mass, no nipple discharge and no skin change.  Abdominal: Soft. Bowel sounds are normal. She exhibits no distension and no mass. There is no hepatosplenomegaly. There is no tenderness. There is no CVA tenderness.  Genitourinary: Rectum normal, vagina normal and uterus normal. Rectal exam shows no mass. Guaiac negative stool. No labial fusion. There is no lesion on the right labia. There is no lesion on the left labia. Cervix exhibits no motion tenderness. Right adnexum displays no mass, no tenderness and no fullness. Left adnexum displays no mass, no tenderness and no fullness. No erythema around the vagina.  Musculoskeletal:       No active synovitis to any joint.    Lymphadenopathy:       Right cervical: No superficial cervical adenopathy present.      Left cervical: No superficial cervical adenopathy present.       Right axillary: No pectoral and no lateral adenopathy present.       Left axillary: No pectoral and no lateral adenopathy present.      Right: No inguinal adenopathy present.       Left: No inguinal adenopathy present.  Neurological: She is alert and oriented to person, place, and time. She has normal strength and normal reflexes. No cranial nerve deficit or sensory deficit. She displays a negative Romberg sign. Coordination and gait normal.  Skin: Skin is warm and dry. No abrasion, no bruising, no ecchymosis and no rash noted. No cyanosis. Nails show no clubbing.  Psychiatric: She has a normal mood and affect. Her speech is normal and behavior is normal.          Assessment & Plan:  HM:  UTD with vaccines she is a non-smoker  Hyperlipidemia  Check today   Migraine  Less in frequency  L/s spine discomfort   Managed orthopedics and worker's comp  Peri-menopause:  Very rare flush.  Last Menses 10/2013    To lab  See me as needed       Assessment & Plan:

## 2014-06-22 ENCOUNTER — Encounter: Payer: Self-pay | Admitting: Internal Medicine

## 2014-06-22 ENCOUNTER — Ambulatory Visit (INDEPENDENT_AMBULATORY_CARE_PROVIDER_SITE_OTHER): Payer: 59 | Admitting: Internal Medicine

## 2014-06-22 ENCOUNTER — Encounter: Payer: Self-pay | Admitting: *Deleted

## 2014-06-22 VITALS — BP 125/75 | HR 80 | Resp 16 | Ht 64.0 in | Wt 160.0 lb

## 2014-06-22 DIAGNOSIS — Z1151 Encounter for screening for human papillomavirus (HPV): Secondary | ICD-10-CM

## 2014-06-22 DIAGNOSIS — Z124 Encounter for screening for malignant neoplasm of cervix: Secondary | ICD-10-CM

## 2014-06-22 DIAGNOSIS — G43109 Migraine with aura, not intractable, without status migrainosus: Secondary | ICD-10-CM

## 2014-06-22 DIAGNOSIS — Z0000 Encounter for general adult medical examination without abnormal findings: Secondary | ICD-10-CM

## 2014-06-22 DIAGNOSIS — G43909 Migraine, unspecified, not intractable, without status migrainosus: Secondary | ICD-10-CM

## 2014-06-22 DIAGNOSIS — E785 Hyperlipidemia, unspecified: Secondary | ICD-10-CM

## 2014-06-22 DIAGNOSIS — M545 Low back pain: Secondary | ICD-10-CM

## 2014-06-22 LAB — LIPID PANEL
Cholesterol: 229 mg/dL — ABNORMAL HIGH (ref 0–200)
HDL: 49 mg/dL (ref >39)
LDL Cholesterol: 152 mg/dL — ABNORMAL HIGH (ref 0–99)
Total CHOL/HDL Ratio: 4.7 Ratio
Triglycerides: 141 mg/dL (ref <150)
VLDL: 28 mg/dL (ref 0–40)

## 2014-06-22 LAB — POCT URINALYSIS DIPSTICK
Bilirubin, UA: NEGATIVE
Blood, UA: NEGATIVE
Glucose, UA: NEGATIVE
Ketones, UA: NEGATIVE
Leukocytes, UA: NEGATIVE
Nitrite, UA: NEGATIVE
Protein, UA: NEGATIVE
Spec Grav, UA: 1.02
Urobilinogen, UA: NEGATIVE
pH, UA: 6.5

## 2014-06-22 LAB — CBC WITH DIFFERENTIAL/PLATELET
Basophils Absolute: 0.1 10*3/uL (ref 0.0–0.1)
Basophils Relative: 1 % (ref 0–1)
Eosinophils Absolute: 0.5 10*3/uL (ref 0.0–0.7)
Eosinophils Relative: 8 % — ABNORMAL HIGH (ref 0–5)
HCT: 38.7 % (ref 36.0–46.0)
Hemoglobin: 13.2 g/dL (ref 12.0–15.0)
Lymphocytes Relative: 33 % (ref 12–46)
Lymphs Abs: 2 10*3/uL (ref 0.7–4.0)
MCH: 31.3 pg (ref 26.0–34.0)
MCHC: 34.1 g/dL (ref 30.0–36.0)
MCV: 91.7 fL (ref 78.0–100.0)
MPV: 9.2 fL (ref 8.6–12.4)
Monocytes Absolute: 0.3 10*3/uL (ref 0.1–1.0)
Monocytes Relative: 5 % (ref 3–12)
Neutro Abs: 3.3 10*3/uL (ref 1.7–7.7)
Neutrophils Relative %: 53 % (ref 43–77)
Platelets: 374 10*3/uL (ref 150–400)
RBC: 4.22 MIL/uL (ref 3.87–5.11)
RDW: 12.8 % (ref 11.5–15.5)
WBC: 6.2 10*3/uL (ref 4.0–10.5)

## 2014-06-22 LAB — COMPLETE METABOLIC PANEL WITH GFR
ALT: 42 U/L — ABNORMAL HIGH (ref 0–35)
AST: 32 U/L (ref 0–37)
Albumin: 4.3 g/dL (ref 3.5–5.2)
Alkaline Phosphatase: 46 U/L (ref 39–117)
BUN: 11 mg/dL (ref 6–23)
CO2: 26 mEq/L (ref 19–32)
Calcium: 10 mg/dL (ref 8.4–10.5)
Chloride: 102 mEq/L (ref 96–112)
Creat: 0.7 mg/dL (ref 0.50–1.10)
GFR, Est African American: >89 mL/min
GFR, Est Non African American: >89 mL/min
Glucose, Bld: 87 mg/dL (ref 70–99)
Potassium: 4.1 mEq/L (ref 3.5–5.3)
Sodium: 138 mEq/L (ref 135–145)
Total Bilirubin: 0.6 mg/dL (ref 0.2–1.2)
Total Protein: 7.2 g/dL (ref 6.0–8.3)

## 2014-06-22 LAB — TSH: TSH: 1.508 u[IU]/mL (ref 0.350–4.500)

## 2014-06-23 ENCOUNTER — Telehealth: Payer: Self-pay | Admitting: *Deleted

## 2014-06-23 LAB — VITAMIN D 25 HYDROXY (VIT D DEFICIENCY, FRACTURES): Vit D, 25-Hydroxy: 30 ng/mL (ref 30–100)

## 2014-06-23 LAB — CYTOLOGY - PAP

## 2014-06-23 NOTE — Telephone Encounter (Signed)
I spoke with [name] and set her up for a F/U with us- eh

## 2014-06-23 NOTE — Telephone Encounter (Signed)
-----   Message from Kendrick Rancheborah D Schoenhoff, MD sent at 06/23/2014  2:28 PM EST ----- Call pt and let her know that her bad cholesterol is higher and one of her liver tests is slighlty high  Give her Ov to see me in next 4 weeks and I will recheck  Route back with OV date   Ok to mail labs to her  thanks

## 2014-07-27 ENCOUNTER — Encounter: Payer: Self-pay | Admitting: Internal Medicine

## 2014-07-27 ENCOUNTER — Ambulatory Visit (INDEPENDENT_AMBULATORY_CARE_PROVIDER_SITE_OTHER): Payer: 59 | Admitting: Internal Medicine

## 2014-07-27 VITALS — BP 114/74 | HR 67 | Resp 16 | Ht 64.0 in | Wt 157.0 lb

## 2014-07-27 DIAGNOSIS — R748 Abnormal levels of other serum enzymes: Secondary | ICD-10-CM

## 2014-07-27 DIAGNOSIS — E785 Hyperlipidemia, unspecified: Secondary | ICD-10-CM

## 2014-07-27 NOTE — Progress Notes (Signed)
Subjective:    Patient ID: Tara [name], female    DOB: September 09, 1965, 49 y.o.   MRN: [id number]  HPI 06/22/2014 note Assessment & Plan:  HM: UTD with vaccines she is a non-smoker  Hyperlipidemia Check today   Migraine Less in frequency  L/s spine discomfort Managed orthopedics and worker's comp  Peri-menopause: Very rare flush. Last Menses 10/2013   To lab See me as needed         Today  Tara [name] is here to follow up on lab abnormalities.  See lipids and lfts  Alt slightly elevated.  After last visit pt started taking red yeast rice.  I had counseled her that red yeast rice has properties similar to statin.  When she saw labs she stopped yeast rice and has been off it for about 4 weeks.  She is asymptomatic  Lipids   Framingham profile  3.9%  FH  Father died age 54 of MI  She does not smoke no htn,  No diabetes  Allergies  Allergen Reactions  . Tape    Past Medical History  Diagnosis Date  . Migraine   . Rotator cuff tear    No past surgical history on file. History   Social History  . Marital Status: Married    Spouse Name: N/A  . Number of Children: N/A  . Years of Education: N/A   Occupational History  . Not on file.   Social History Main Topics  . Smoking status: Never Smoker   . Smokeless tobacco: Never Used  . Alcohol Use: No  . Drug Use: No  . Sexual Activity: Yes    Birth Control/ Protection: None   Other Topics Concern  . Not on file   Social History Narrative   Family History  Problem Relation Age of Onset  . Heart disease Father 2    MI  . Hyperlipidemia Brother   . Hypertension Maternal Grandmother    Patient Active Problem List   Diagnosis Date Noted  . Renal cyst, left 06/19/2013  . History of migraine headaches 03/05/2013  . 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 Hyperlipidemia 03/05/2013  . 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 Rotator cuff syndrome 03/05/2013  . Low back pain 03/05/2013   Current Outpatient  Prescriptions on File Prior to Visit  Medication Sig Dispense Refill  . Ascorbic Acid (VITAMIN C PO) Take by mouth.    . cholecalciferol (VITAMIN D) 1000 UNITS tablet Take 1,000 Units by mouth daily.    . cyclobenzaprine (FLEXERIL) 10 MG tablet Take 1 tablet (10 mg total) by mouth 2 (two) times daily as needed for muscle spasms. 20 tablet 0  . Omega-3 Fatty Acids (FISH OIL) 500 MG CAPS Natures made Fish oil pearls  BID 180 capsule 1  . Red Yeast Rice 600 MG CAPS Take 600 mg by mouth daily.    . Rizatriptan Benzoate (MAXALT PO) Take by mouth.     No current facility-administered medications on file prior to visit.       Review of Systems See HPI    Objective:   Physical Exam  Physical Exam  Nursing note and vitals reviewed.  Constitutional: She is oriented to person, place, and time. She appears well-developed and well-nourished.  HENT:  Head: Normocephalic and atraumatic.  Cardiovascular: Normal rate and regular rhythm. Exam reveals no gallop and no friction rub.  No murmur heard.  Pulmonary/Chest: Breath sounds normal. She has no wheezes. She has no rales.  Neurological: She is alert and  oriented to person, place, and time.  Skin: Skin is warm and dry.  Psychiatric: She has a normal mood and affect. Her behavior is normal.        Assessment & Plan:  Hyperlipidemia  Pt does not want any RX meds.  Will try DASH Diet  She is off the red yeast rice  Elevated alt  She has been off red yeast rice for approx 4 weeks  Will recheck today   See me in May

## 2014-07-28 ENCOUNTER — Encounter: Payer: Self-pay | Admitting: Internal Medicine

## 2014-07-28 LAB — HEPATIC FUNCTION PANEL
ALT: 21 U/L (ref 0–35)
AST: 20 U/L (ref 0–37)
Albumin: 4.6 g/dL (ref 3.5–5.2)
Alkaline Phosphatase: 50 U/L (ref 39–117)
Bilirubin, Direct: 0.1 mg/dL (ref 0.0–0.3)
Indirect Bilirubin: 0.5 mg/dL (ref 0.2–1.2)
Total Bilirubin: 0.6 mg/dL (ref 0.2–1.2)
Total Protein: 7.7 g/dL (ref 6.0–8.3)

## 2014-07-28 NOTE — Progress Notes (Signed)
Mailed a copy to patient-eh 

## 2014-09-07 ENCOUNTER — Other Ambulatory Visit: Payer: Self-pay

## 2014-09-07 DIAGNOSIS — Z1231 Encounter for screening mammogram for malignant neoplasm of breast: Secondary | ICD-10-CM

## 2014-09-11 ENCOUNTER — Ambulatory Visit
Admission: RE | Admit: 2014-09-11 | Discharge: 2014-09-11 | Disposition: A | Discharge status: Home / Self Care | Payer: 59 | Source: Ambulatory Visit

## 2014-09-11 DIAGNOSIS — Z1231 Encounter for screening mammogram for malignant neoplasm of breast: Secondary | ICD-10-CM

## 2015-05-31 DIAGNOSIS — R079 Chest pain, unspecified: Secondary | ICD-10-CM | POA: Diagnosis not present

## 2015-05-31 DIAGNOSIS — R05 Cough: Secondary | ICD-10-CM | POA: Diagnosis not present

## 2015-06-24 DIAGNOSIS — E784 Other hyperlipidemia: Secondary | ICD-10-CM | POA: Diagnosis not present

## 2015-06-24 DIAGNOSIS — I517 Cardiomegaly: Secondary | ICD-10-CM | POA: Diagnosis not present

## 2015-06-24 DIAGNOSIS — Z0000 Encounter for general adult medical examination without abnormal findings: Secondary | ICD-10-CM | POA: Diagnosis not present

## 2015-06-29 ENCOUNTER — Other Ambulatory Visit (HOSPITAL_COMMUNITY): Payer: Self-pay | Admitting: Internal Medicine

## 2015-06-29 DIAGNOSIS — I517 Cardiomegaly: Secondary | ICD-10-CM

## 2015-07-06 ENCOUNTER — Ambulatory Visit (HOSPITAL_COMMUNITY): Payer: 59 | Attending: Cardiovascular Disease

## 2015-07-06 ENCOUNTER — Other Ambulatory Visit: Payer: Self-pay

## 2015-07-06 DIAGNOSIS — I071 Rheumatic tricuspid insufficiency: Secondary | ICD-10-CM | POA: Diagnosis not present

## 2015-07-06 DIAGNOSIS — I340 Nonrheumatic mitral (valve) insufficiency: Secondary | ICD-10-CM | POA: Insufficient documentation

## 2015-07-06 DIAGNOSIS — I517 Cardiomegaly: Secondary | ICD-10-CM | POA: Insufficient documentation

## 2015-07-27 DIAGNOSIS — J309 Allergic rhinitis, unspecified: Secondary | ICD-10-CM | POA: Diagnosis not present

## 2015-07-27 DIAGNOSIS — E7800 Pure hypercholesterolemia, unspecified: Secondary | ICD-10-CM | POA: Diagnosis not present

## 2015-07-27 DIAGNOSIS — I519 Heart disease, unspecified: Secondary | ICD-10-CM | POA: Diagnosis not present

## 2015-07-27 DIAGNOSIS — R634 Abnormal weight loss: Secondary | ICD-10-CM | POA: Diagnosis not present

## 2015-09-02 ENCOUNTER — Encounter: Payer: Self-pay | Admitting: Family Medicine

## 2015-09-07 ENCOUNTER — Encounter: Payer: Self-pay | Admitting: Family Medicine

## 2015-09-14 NOTE — Progress Notes (Signed)
   Referring-Dr Constance GoltzSchoenhoff  HPI: 50 year old female for evaluation of diastolic dysfunction. Echocardiogram February 2017 interpreted as normal LV systolic function, grade 2 diastolic dysfunction, mild mitral regurgitation and tricuspid regurgitation. Patient recently had a chest x-ray and was felt to have cardiac enlargement. The above echocardiogram was ordered to further evaluate. She does not have dyspnea on exertion, orthopnea, PND, pedal edema, syncope or chest pain. She occasionally feels palpitations with activities. Because of the above we were asked to evaluate.  Current Outpatient Prescriptions  Medication Sig Dispense Refill  . Ascorbic Acid (VITAMIN C PO) Take 500 mg by mouth daily.     . cholecalciferol (VITAMIN D) 1000 UNITS tablet Take 1,000 Units by mouth daily.    . cyclobenzaprine (FLEXERIL) 10 MG tablet Take 1 tablet (10 mg total) by mouth 2 (two) times daily as needed for muscle spasms. 20 tablet 0  . Omega-3 Fatty Acids (FISH OIL) 500 MG CAPS Natures made Fish oil pearls 500mg  BID 180 capsule 1  . Rizatriptan Benzoate (MAXALT PO) Take by mouth as needed (headache).      No current facility-administered medications for this visit.    Allergies  Allergen Reactions  . Tape Rash     Past Medical History  Diagnosis Date  . Migraine   . Rotator cuff tear   . Hyperlipidemia   . Asthma     Past Surgical History  Procedure Laterality Date  . No prior surgery      Social History   Social History  . Marital Status: Married    Spouse Name: N/A  . Number of Children: 2  . Years of Education: N/A   Occupational History  . Not on file.   Social History Main Topics  . Smoking status: Never Smoker   . Smokeless tobacco: Never Used  . Alcohol Use: 0.0 oz/week    0 Standard drinks or equivalent per week     Comment: Rare  . Drug Use: No  . Sexual Activity: Yes    Birth Control/ Protection: None   Other Topics Concern  . Not on file   Social History  Narrative    Family History  Problem Relation Age of Onset  . Heart disease Father 3134    MI  . Hyperlipidemia Brother   . Hypertension Maternal Grandmother     ROS: no fevers or chills, productive cough, hemoptysis, dysphasia, odynophagia, melena, hematochezia, dysuria, hematuria, rash, seizure activity, orthopnea, PND, pedal edema, claudication. Remaining systems are negative.  Physical Exam:   Blood pressure 115/78, pulse 60, height 5\' 4"  (1.626 m), weight 149 lb (67.586 kg).  General:  Well developed/well nourished in NAD Skin warm/dry Patient not depressed No peripheral clubbing Back-normal HEENT-normal/normal eyelids Neck supple/normal carotid upstroke bilaterally; no bruits; no JVD; no thyromegaly chest - CTA/ normal expansion CV - RRR/normal S1 and S2; no murmurs, rubs or gallops;  PMI nondisplaced Abdomen -NT/ND, no HSM, no mass, + bowel sounds, no bruit 2+ femoral pulses, no bruits Ext-no edema, chords, 2+ DP Neuro-grossly nonfocal  ECG Sinus rhythm at a rate of 59.No ST changes.

## 2015-09-15 ENCOUNTER — Encounter: Payer: Self-pay | Admitting: Cardiology

## 2015-09-15 ENCOUNTER — Ambulatory Visit (INDEPENDENT_AMBULATORY_CARE_PROVIDER_SITE_OTHER): Payer: 59 | Admitting: Cardiology

## 2015-09-15 VITALS — BP 115/78 | HR 60 | Ht 64.0 in | Wt 149.0 lb

## 2015-09-15 DIAGNOSIS — E785 Hyperlipidemia, unspecified: Secondary | ICD-10-CM | POA: Diagnosis not present

## 2015-09-15 DIAGNOSIS — I5189 Other ill-defined heart diseases: Secondary | ICD-10-CM

## 2015-09-15 DIAGNOSIS — I519 Heart disease, unspecified: Secondary | ICD-10-CM

## 2015-09-15 DIAGNOSIS — I517 Cardiomegaly: Secondary | ICD-10-CM | POA: Diagnosis not present

## 2015-09-15 DIAGNOSIS — R002 Palpitations: Secondary | ICD-10-CM

## 2015-09-15 NOTE — Patient Instructions (Signed)
Your physician recommends that you schedule a follow-up appointment in: AS NEEDED  

## 2015-09-15 NOTE — Assessment & Plan Note (Signed)
I have reviewed the patient's echocardiogram personally. She does not have left ventricular hypertrophy, left atrial enlargement or signs of elevated pulmonary pressure. I believe her diastolic dysfunction is grade 1. She is not having symptoms of congestive heart failure including no dyspnea on exertion, orthopnea, PND or pedal edema. I do not believe we need to pursue further cardiac evaluation at this point.

## 2015-09-15 NOTE — Assessment & Plan Note (Signed)
Management per primary care. 

## 2015-09-15 NOTE — Assessment & Plan Note (Signed)
Patient has some palpitations with vigorous activities. No chest pain or dizziness. He discussed an exercise treadmill to further evaluate but she would prefer to avoid this for now. We will consider if she has further symptoms in the future.

## 2015-11-01 DIAGNOSIS — L0390 Cellulitis, unspecified: Secondary | ICD-10-CM | POA: Diagnosis not present

## 2015-11-03 ENCOUNTER — Other Ambulatory Visit: Payer: Self-pay | Admitting: Internal Medicine

## 2015-11-03 DIAGNOSIS — Z1231 Encounter for screening mammogram for malignant neoplasm of breast: Secondary | ICD-10-CM

## 2015-11-23 ENCOUNTER — Ambulatory Visit
Admission: RE | Admit: 2015-11-23 | Discharge: 2015-11-23 | Disposition: A | Discharge status: Home / Self Care | Payer: 59 | Source: Ambulatory Visit | Attending: Internal Medicine | Admitting: Internal Medicine

## 2015-11-23 DIAGNOSIS — Z1231 Encounter for screening mammogram for malignant neoplasm of breast: Secondary | ICD-10-CM

## 2016-05-06 DIAGNOSIS — H524 Presbyopia: Secondary | ICD-10-CM | POA: Diagnosis not present

## 2016-07-03 DIAGNOSIS — R002 Palpitations: Secondary | ICD-10-CM | POA: Diagnosis not present

## 2016-07-03 DIAGNOSIS — E7800 Pure hypercholesterolemia, unspecified: Secondary | ICD-10-CM | POA: Diagnosis not present

## 2016-07-03 DIAGNOSIS — Z0000 Encounter for general adult medical examination without abnormal findings: Secondary | ICD-10-CM | POA: Diagnosis not present

## 2016-07-03 DIAGNOSIS — I517 Cardiomegaly: Secondary | ICD-10-CM | POA: Diagnosis not present

## 2016-08-08 DIAGNOSIS — I519 Heart disease, unspecified: Secondary | ICD-10-CM | POA: Diagnosis not present

## 2016-08-08 DIAGNOSIS — E7800 Pure hypercholesterolemia, unspecified: Secondary | ICD-10-CM | POA: Diagnosis not present

## 2016-10-10 DIAGNOSIS — J111 Influenza due to unidentified influenza virus with other respiratory manifestations: Secondary | ICD-10-CM | POA: Diagnosis not present

## 2016-10-10 DIAGNOSIS — R509 Fever, unspecified: Secondary | ICD-10-CM | POA: Diagnosis not present

## 2016-11-09 DIAGNOSIS — G43109 Migraine with aura, not intractable, without status migrainosus: Secondary | ICD-10-CM | POA: Diagnosis not present

## 2016-11-13 DIAGNOSIS — R42 Dizziness and giddiness: Secondary | ICD-10-CM | POA: Diagnosis not present

## 2016-11-13 DIAGNOSIS — G43109 Migraine with aura, not intractable, without status migrainosus: Secondary | ICD-10-CM | POA: Diagnosis not present

## 2016-11-20 ENCOUNTER — Encounter: Payer: Self-pay | Admitting: Neurology

## 2016-12-26 ENCOUNTER — Telehealth: Payer: Self-pay | Admitting: Neurology

## 2016-12-26 NOTE — Telephone Encounter (Signed)
Tara [name], can you call this patient and get her on my schedule for this week please? thanks

## 2016-12-27 NOTE — Telephone Encounter (Signed)
Called pt. Left mssg and offered new patient appt w/ Dr. Lucia GaskinsAhern. She may keep previously scheduled appt w/ Dr. Karel JarvisAquino or may call back to schedule sooner appt @ [facility] if desired.

## 2016-12-27 NOTE — Telephone Encounter (Signed)
Yes please call. But mention that to her, that now she has 2 neurology appointments and maybe she wants to see Dr. Karel JarvisAquino. Her choice happy to see her this week. She can come Friday at noon

## 2016-12-28 NOTE — Telephone Encounter (Signed)
Thanks, if you don't mind give her one more call thanks

## 2016-12-28 NOTE — Telephone Encounter (Signed)
Called pt, this time at home #, and left another mssg offering new pt appt w/ Dr. Lucia GaskinsAhern. She may call back to schedule.

## 2016-12-29 NOTE — Telephone Encounter (Signed)
Pt called back and spoke to our new pt coordinator. [facility] Work-in appt scheduled for Mon.

## 2016-12-31 ENCOUNTER — Encounter: Payer: Self-pay | Admitting: Neurology

## 2017-01-01 ENCOUNTER — Ambulatory Visit (INDEPENDENT_AMBULATORY_CARE_PROVIDER_SITE_OTHER): Payer: 59 | Admitting: Neurology

## 2017-01-01 ENCOUNTER — Encounter: Payer: Self-pay | Admitting: Neurology

## 2017-01-01 VITALS — BP 96/67 | HR 65 | Ht 64.5 in | Wt 151.8 lb

## 2017-01-01 DIAGNOSIS — H547 Unspecified visual loss: Secondary | ICD-10-CM | POA: Diagnosis not present

## 2017-01-01 DIAGNOSIS — G43109 Migraine with aura, not intractable, without status migrainosus: Secondary | ICD-10-CM | POA: Diagnosis not present

## 2017-01-01 DIAGNOSIS — R51 Headache with orthostatic component, not elsewhere classified: Secondary | ICD-10-CM

## 2017-01-01 DIAGNOSIS — R4701 Aphasia: Secondary | ICD-10-CM

## 2017-01-01 DIAGNOSIS — R29818 Other symptoms and signs involving the nervous system: Secondary | ICD-10-CM | POA: Diagnosis not present

## 2017-01-01 DIAGNOSIS — R519 Headache, unspecified: Secondary | ICD-10-CM

## 2017-01-01 MED ORDER — ONDANSETRON 4 MG PO TBDP: 4.0000 mg | ORAL_TABLET | Freq: Three times a day (TID) | ORAL | 6 refills | Status: DC | PRN

## 2017-01-01 NOTE — Progress Notes (Signed)
[facility]    Provider:  Dr Lucia Gaskins Referring Provider: Kendrick Ranch, * Primary Care Physician:  Kendrick Ranch, MD  CC:  Migraines  HPI:  Tara [name] is a 51 y.o. female here as a referral from Dr. Constance Goltz for migraines. Migraines were monthly until menopause and it changed. She does not always have auras, but occasionally it looks like heat wave and has visual disturbances. Migraines are unilateral but mostly on the left with vertigo. Her last headache was concerning, she woke up with the migraine in the morning. She couldn't figure words and couldn't focus, it was so blurry she couldn't see the letters on her computer, when she was on the phone trying to get an appointment she couldn't understand what they were telling her, the headache was painful. She takes Maxalt when she feels the migraine or if she has an aura aura. Relapax helps if she catches her migraine. Symptoms lasted 2 hours. She thought she was having a stroke. She went to [place] [facility] and patient was worried she was having a stroke. Took a Relpax and slept and she took a second Relpax and it helped. Her usual migraines start more on the left side, lots of nausea, vertigo, light and souns bother her, she wants to be in a dark room. Relpax helps but they can last over 24 hours if untreated. In the last 2-3 months the frequency is worsening with vision changes and worse positionally. Phenergan and toradol helps. No other focal neurologic deficits, associated symptoms, inciting events or modifiable factors.  Tried: Imitrex, Relpax, Migrainal, maxalt.   Reviewed notes, labs and imaging from outside physicians, which showed:  CMP unremarkable, vitamin E normal, TSH normal  Reviewed CT of the head report from 2004: CLINICAL DATA:    HEADACHE AND DIZZINESS. CT OF THE HEAD WITHOUT CONTRAST NO PREVIOUS FOR COMPARISON. ROUTINE NON-CONTRAST HEAD CT WAS PERFORMED. THERE IS NO EVIDENCE OF  INTRACRANIAL HEMORRHAGE, BRAIN EDEMA, OR MASS EFFECT.  THE VENTRICLES ARE NORMAL.  NO EXTRA-AXIAL ABNORMALITIES ARE IDENTIFIED.  BONE WINDOWS SHOW NO SIGNIFICANT ABNORMALITIES. IMPRESSION NEGATIVE NON-CONTRAST HEAD CT  Reviewed notes. In 2017 patient has a palpitation with vigorous exercises been no changes in her dizziness. She declined a exercise treadmill test. Patient had an echocardiogram completed without left ventricular hypertrophy, left atrial enlargement or signs of elevated pulmonary pressure, she has diastolic dysfunction grade 1, no symptoms or signs of congestive heart failure. Patient reported lumbosacral spine discomfort and was sent to orthopedics and Workmen's Comp. ALT in the past with slightly elevated. Hyperlipidemia in the past treated with statins. She was taking Maxalt for migraine headaches in the past. Migraines are less frequent since stopping oral birth control pills. Lumbar MRI showed a small L4-L5 disc protrusion.  Review of Systems: Patient complains of symptoms per HPI as well as the following symptoms: Chest pain, no shortness of breath, no fevers, no rash, no meningismus. Pertinent negatives and positives per HPI. All others negative.   Social History   Social History  . Marital status: Married    Spouse name: N/A  . Number of children: 2  . Years of education: BSN   Occupational History  . [employer]    Social History Main Topics  . Smoking status: Never Smoker  . Smokeless tobacco: Never Used  . Alcohol use No     Comment: Rare  . Drug use: No  . Sexual activity: Yes    Birth control/ protection: None  Other Topics Concern  . Not on file   Social History Narrative   Lives at home w/ her husband and son   Right-handed   Caffeine: seldom, soda on the weekend    Family History  Problem Relation Age of Onset  . Heart disease Father 35       MI  . Hypertension Maternal Grandmother   . Hyperlipidemia Brother     Past Medical History:    Diagnosis Date  . Asthma   . Hyperlipidemia   . Migraine   . Rotator cuff tear   . Spondylolisthesis    L4-5    Past Surgical History:  Procedure Laterality Date  . No prior surgery      Current Outpatient Prescriptions  Medication Sig Dispense Refill  . cholecalciferol (VITAMIN D) 1000 UNITS tablet Take 1,000 Units by mouth daily.    . cyclobenzaprine (FLEXERIL) 10 MG tablet Take 1 tablet (10 mg total) by mouth 2 (two) times daily as needed for muscle spasms. 20 tablet 0  . Omega-3 Fatty Acids (FISH OIL) 500 MG CAPS Natures made Fish oil pearls 500mg  BID 180 capsule 1  . Probiotic Product (PROBIOTIC PO) Take by mouth daily.    . Rizatriptan Benzoate (MAXALT PO) Take by mouth as needed (headache).     . ondansetron (ZOFRAN ODT) 4 MG disintegrating tablet Take 1 tablet (4 mg total) by mouth every 8 (eight) hours as needed for nausea or vomiting. 30 tablet 6   No current facility-administered medications for this visit.     Allergies as of 01/01/2017 - Review Complete 01/01/2017  Allergen Reaction Noted  . Tape Rash 03/05/2013    Vitals: BP 96/67   Pulse 65   Ht 5' 4.5" (1.638 m)   Wt 151 lb 12.8 oz (68.9 kg)   LMP 11/24/2013   BMI 25.65 kg/m  Last Weight:  Wt Readings from Last 1 Encounters:  01/01/17 151 lb 12.8 oz (68.9 kg)   Last Height:   Ht Readings from Last 1 Encounters:  01/01/17 5' 4.5" (1.638 m)   Physical exam: Exam: Gen: NAD, conversant, well nourised, well groomed                     CV: RRR, no MRG. No Carotid Bruits. No peripheral edema, warm, nontender Eyes: Conjunctivae clear without exudates or hemorrhage  Neuro: Detailed Neurologic Exam  Speech:    Speech is normal; fluent and spontaneous with normal comprehension.  Cognition:    The patient is oriented to person, place, and time;     recent and remote memory intact;     language fluent;     normal attention, concentration,     fund of knowledge Cranial Nerves:    The pupils are  equal, round, and reactive to light. The fundi are normal and spontaneous venous pulsations are present. Visual fields are full to finger confrontation. Extraocular movements are intact. Trigeminal sensation is intact and the muscles of mastication are normal. The face is symmetric. The palate elevates in the midline. Hearing intact. Voice is normal. Shoulder shrug is normal. The tongue has normal motion without fasciculations.   Coordination:    Normal finger to nose and heel to shin. Normal rapid alternating movements.   Gait:    Heel-toe and tandem gait are normal.   Motor Observation:    No asymmetry, no atrophy, and no involuntary movements noted. Tone:    Normal muscle tone.    Posture:  Posture is normal. normal erect    Strength:    Strength is V/V in the upper and lower limbs.      Sensation: intact to LT     Reflex Exam:  DTR's:    Deep tendon reflexes in the upper and lower extremities are normal bilaterally.   Toes:    The toes are downgoing bilaterally.   Clonus:    Clonus is absent.       Assessment/Plan:  This is a 51 year old patient with episodic migraines. Patient had a severe headache with aphasia, confusion, vision loss. Headache frequency worsening with a positional quality. Needed MRI of the brain with and without contrast to evaluate for strokes, other lesions or space-occupying masses given the symptoms above.  For acute onset of migraine educated patient on acute management, take medications as soon as possible, may combine medications for better efficacy including Relpax along with Cambia and zofran  Orders Placed This Encounter  Procedures  . MR BRAIN W WO CONTRAST   Discussed: To prevent or relieve headaches, try the following: Cool Compress. Lie down and place a cool compress on your head.  Avoid headache triggers. If certain foods or odors seem to have triggered your migraines in the past, avoid them. A headache diary might help you identify  triggers.  Include physical activity in your daily routine. Try a daily walk or other moderate aerobic exercise.  Manage stress. Find healthy ways to cope with the stressors, such as delegating tasks on your to-do list.  Practice relaxation techniques. Try deep breathing, yoga, massage and visualization.  Eat regularly. Eating regularly scheduled meals and maintaining a healthy diet might help prevent headaches. Also, drink plenty of fluids.  Follow a regular sleep schedule. Sleep deprivation might contribute to headaches Consider biofeedback. With this mind-body technique, you learn to control certain bodily functions - such as muscle tension, heart rate and blood pressure - to prevent headaches or reduce headache pain.    Proceed to emergency room if you experience new or worsening symptoms or symptoms do not resolve, if you have new neurologic symptoms or if headache is severe, or for any concerning symptom.   Provided education and documentation from American headache Society toolbox including articles on: chronic migraine medication overuse headache, chronic migraines, prevention of migraines, behavioral and other nonpharmacologic treatments for headache.   Naomie DeanAntonia Shakayla Hickox, MD  [facility] [address] [place], [place] [id number]  Phone [phone] Fax [phone]

## 2017-01-01 NOTE — Patient Instructions (Signed)
At onset of migraine or for severe migraine take: Relpax + Zofran + Cambia. At work can try Cambia alone with zofran.     Diclofenac powder for oral solution What is this medicine? DICLOFENAC (dye KLOE fen ak) is a non-steroidal anti-inflammatory drug (NSAID). It is used to treat migraine pain. This medicine may be used for other purposes; ask your health care provider or pharmacist if you have questions. COMMON BRAND NAME(S): Cambia What should I tell my health care provider before I take this medicine? They need to know if you have any of these conditions: -asthma, especially aspirin sensitive asthma -coronary artery bypass graft (CABG) surgery within the past 2 weeks -drink more than 3 alcohol-containing drinks a day -heart disease or circulation problems like heart failure or leg edema (fluid retention) -high blood pressure -kidney disease -liver disease -phenylketonuria -stomach problems -an unusual or allergic reaction to diclofenac, aspirin, other NSAIDs, other medicines, foods, dyes, or preservatives -pregnant or trying to get pregnant -breast-feeding How should I use this medicine? Mix this medicine with 1 to 2 ounces of water. Drink the medicine and water together. Follow the directions on the prescription label. Do not take your medicine more often than directed. Long-term, continuous use may increase the risk of heart attack or stroke. A special MedGuide will be given to you by the pharmacist with each prescription and refill. Be sure to read this information carefully each time. Talk to your pediatrician regarding the use of this medicine in children. Special care may be needed. Elderly patients over 19 years old may have a stronger reaction and need a smaller dose. Overdosage: If you think you have taken too much of this medicine contact a poison control center or emergency room at once. NOTE: This medicine is only for you. Do not share this medicine with others. What if  I miss a dose? This does not apply. What may interact with this medicine? Do not take this medicine with any of the following medications: -cidofovir -ketorolac -methotrexate This medicine may also interact with the following medications: -alcohol -aspirin and aspirin-like medicines -cyclosporine -diuretics -lithium -medicines for blood pressure -medicines for osteoporosis -medicines that affect platelets -medicines that treat or prevent blood clots like warfarin -NSAIDs, medicines for pain and inflammation, like ibuprofen or naproxen -pemetrexed -steroid medicines like prednisone or cortisone This list may not describe all possible interactions. Give your health care provider a list of all the medicines, herbs, non-prescription drugs, or dietary supplements you use. Also tell them if you smoke, drink alcohol, or use illegal drugs. Some items may interact with your medicine. What should I watch for while using this medicine? Tell your doctor or health care professional if your pain does not get better. Talk to your doctor before taking another medicine for pain. Do not treat yourself. This medicine does not prevent heart attack or stroke. In fact, this medicine may increase the chance of a heart attack or stroke. The chance may increase with longer use of this medicine and in people who have heart disease. If you take aspirin to prevent heart attack or stroke, talk with your doctor or health care professional. Do not take medicines such as ibuprofen and naproxen with this medicine. Side effects such as stomach upset, nausea, or ulcers may be more likely to occur. Many medicines available without a prescription should not be taken with this medicine. This medicine can cause ulcers and bleeding in the stomach and intestines at any time during treatment. Do not smoke  cigarettes or drink alcohol. These increase irritation to your stomach and can make it more susceptible to damage from this  medicine. Ulcers and bleeding can happen without warning symptoms and can cause death. You may get drowsy or dizzy. Do not drive, use machinery, or do anything that needs mental alertness until you know how this medicine affects you. Do not stand or sit up quickly, especially if you are an older patient. This reduces the risk of dizzy or fainting spells. This medicine can cause you to bleed more easily. Try to avoid damage to your teeth and gums when you brush or floss your teeth. If you take migraine medicines for 10 or more days a month, your migraines may get worse. Keep a diary of headache days and medicine use. Contact your healthcare professional if your migraine attacks occur more frequently. What side effects may I notice from receiving this medicine? Side effects that you should report to your doctor or health care professional as soon as possible: -allergic reactions like skin rash, itching or hives, swelling of the face, lips, or tongue -black or bloody stools, blood in the urine or vomit -blurred vision -chest pain -difficulty breathing or wheezing -nausea or vomiting -fever -redness, blistering, peeling or loosening of the skin, including inside the mouth -slurred speech or weakness on one side of the body -trouble passing urine or change in the amount of urine -unexplained weight gain or swelling -unusually weak or tired -yellowing of eyes or skin Side effects that usually do not require medical attention (report to your doctor or health care professional if they continue or are bothersome): -constipation -diarrhea -dizziness -headache -heartburn This list may not describe all possible side effects. Call your doctor for medical advice about side effects. You may report side effects to FDA at 1-800-FDA-1088. Where should I keep my medicine? Keep out of the reach of children. Store at room temperature between 15 and 30 degrees C (59 and 86 degrees F). Throw away any unused  medicine after the expiration date. NOTE: This sheet is a summary. It may not cover all possible information. If you have questions about this medicine, talk to your doctor, pharmacist, or health care provider.  2018 Elsevier/Gold Standard (2015-06-17 09:56:49)    Ondansetron oral dissolving tablet What is this medicine? ONDANSETRON (on DAN se tron) is used to treat nausea and vomiting caused by chemotherapy. It is also used to prevent or treat nausea and vomiting after surgery. This medicine may be used for other purposes; ask your health care provider or pharmacist if you have questions. COMMON BRAND NAME(S): Zofran ODT What should I tell my health care provider before I take this medicine? They need to know if you have any of these conditions: -heart disease -history of irregular heartbeat -liver disease -low levels of magnesium or potassium in the blood -an unusual or allergic reaction to ondansetron, granisetron, other medicines, foods, dyes, or preservatives -pregnant or trying to get pregnant -breast-feeding How should I use this medicine? These tablets are made to dissolve in the mouth. Do not try to push the tablet through the foil backing. With dry hands, peel away the foil backing and gently remove the tablet. Place the tablet in the mouth and allow it to dissolve, then swallow. While you may take these tablets with water, it is not necessary to do so. Talk to your pediatrician regarding the use of this medicine in children. Special care may be needed. Overdosage: If you think you have taken too  much of this medicine contact a poison control center or emergency room at once. NOTE: This medicine is only for you. Do not share this medicine with others. What if I miss a dose? If you miss a dose, take it as soon as you can. If it is almost time for your next dose, take only that dose. Do not take double or extra doses. What may interact with this medicine? Do not take this  medicine with any of the following medications: -apomorphine -certain medicines for fungal infections like fluconazole, itraconazole, ketoconazole, posaconazole, voriconazole -cisapride -dofetilide -dronedarone -pimozide -thioridazine -ziprasidone This medicine may also interact with the following medications: -carbamazepine -certain medicines for depression, anxiety, or psychotic disturbances -fentanyl -linezolid -MAOIs like Carbex, Eldepryl, Marplan, Nardil, and Parnate -methylene blue (injected into a vein) -other medicines that prolong the QT interval (cause an abnormal heart rhythm) -phenytoin -rifampicin -tramadol This list may not describe all possible interactions. Give your health care provider a list of all the medicines, herbs, non-prescription drugs, or dietary supplements you use. Also tell them if you smoke, drink alcohol, or use illegal drugs. Some items may interact with your medicine. What should I watch for while using this medicine? Check with your doctor or health care professional as soon as you can if you have any sign of an allergic reaction. What side effects may I notice from receiving this medicine? Side effects that you should report to your doctor or health care professional as soon as possible: -allergic reactions like skin rash, itching or hives, swelling of the face, lips, or tongue -breathing problems -confusion -dizziness -fast or irregular heartbeat -feeling faint or lightheaded, falls -fever and chills -loss of balance or coordination -seizures -sweating -swelling of the hands and feet -tightness in the chest -tremors -unusually weak or tired Side effects that usually do not require medical attention (report to your doctor or health care professional if they continue or are bothersome): -constipation or diarrhea -headache This list may not describe all possible side effects. Call your doctor for medical advice about side effects. You may  report side effects to FDA at 1-800-FDA-1088. Where should I keep my medicine? Keep out of the reach of children. Store between 2 and 30 degrees C (36 and 86 degrees F). Throw away any unused medicine after the expiration date. NOTE: This sheet is a summary. It may not cover all possible information. If you have questions about this medicine, talk to your doctor, pharmacist, or health care provider.  2018 Elsevier/Gold Standard (2013-02-19 16:21:52)

## 2017-01-09 ENCOUNTER — Ambulatory Visit: Payer: Self-pay | Admitting: Neurology

## 2017-02-13 DIAGNOSIS — R42 Dizziness and giddiness: Secondary | ICD-10-CM | POA: Diagnosis not present

## 2017-02-13 DIAGNOSIS — E7800 Pure hypercholesterolemia, unspecified: Secondary | ICD-10-CM | POA: Diagnosis not present

## 2017-02-13 DIAGNOSIS — G43109 Migraine with aura, not intractable, without status migrainosus: Secondary | ICD-10-CM | POA: Diagnosis not present

## 2017-03-20 MED FILL — ROSUVASTATIN CALCIUM 10 MG: 10 | 28 days supply | Qty: 12 | Fill #0

## 2017-04-02 ENCOUNTER — Ambulatory Visit: Payer: Self-pay

## 2017-04-02 ENCOUNTER — Other Ambulatory Visit: Payer: Self-pay | Admitting: Occupational Medicine

## 2017-04-02 DIAGNOSIS — M25531 Pain in right wrist: Secondary | ICD-10-CM

## 2017-04-02 DIAGNOSIS — M25561 Pain in right knee: Secondary | ICD-10-CM

## 2017-04-02 DIAGNOSIS — M25571 Pain in right ankle and joints of right foot: Secondary | ICD-10-CM

## 2017-04-09 DIAGNOSIS — E7800 Pure hypercholesterolemia, unspecified: Secondary | ICD-10-CM | POA: Diagnosis not present

## 2017-04-09 DIAGNOSIS — G43109 Migraine with aura, not intractable, without status migrainosus: Secondary | ICD-10-CM | POA: Diagnosis not present

## 2017-04-12 MED FILL — ROSUVASTATIN CALCIUM 10 MG: 10 | 28 days supply | Qty: 12 | Fill #0

## 2017-05-07 MED FILL — ROSUVASTATIN CALCIUM 10 MG: 10 | 84 days supply | Qty: 36 | Fill #1

## 2017-07-03 ENCOUNTER — Other Ambulatory Visit (HOSPITAL_COMMUNITY)
Admission: RE | Admit: 2017-07-03 | Discharge: 2017-07-03 | Disposition: A | Discharge status: Home / Self Care | Payer: 59 | Source: Ambulatory Visit | Attending: Internal Medicine | Admitting: Internal Medicine

## 2017-07-03 ENCOUNTER — Other Ambulatory Visit: Payer: Self-pay | Admitting: Internal Medicine

## 2017-07-03 DIAGNOSIS — E7800 Pure hypercholesterolemia, unspecified: Secondary | ICD-10-CM | POA: Diagnosis not present

## 2017-07-03 DIAGNOSIS — Z124 Encounter for screening for malignant neoplasm of cervix: Secondary | ICD-10-CM | POA: Diagnosis not present

## 2017-07-03 DIAGNOSIS — G43909 Migraine, unspecified, not intractable, without status migrainosus: Secondary | ICD-10-CM | POA: Diagnosis not present

## 2017-07-03 DIAGNOSIS — Z01419 Encounter for gynecological examination (general) (routine) without abnormal findings: Secondary | ICD-10-CM | POA: Diagnosis not present

## 2017-07-03 DIAGNOSIS — Z0000 Encounter for general adult medical examination without abnormal findings: Secondary | ICD-10-CM | POA: Diagnosis not present

## 2017-07-06 LAB — CYTOLOGY - PAP
Diagnosis: NEGATIVE
HPV: NOT DETECTED

## 2017-07-11 ENCOUNTER — Other Ambulatory Visit: Payer: Self-pay | Admitting: Internal Medicine

## 2017-07-11 DIAGNOSIS — Z1231 Encounter for screening mammogram for malignant neoplasm of breast: Secondary | ICD-10-CM

## 2017-07-23 ENCOUNTER — Telehealth: Payer: Self-pay | Admitting: Internal Medicine

## 2017-07-23 NOTE — Telephone Encounter (Signed)
Received incoming records from [facility] for upcoming appointment on 09/19/17 @ 11:45am with Dr. Rennis GoldenHilty. Records located in Medical Records. 07/23/17 ab

## 2017-07-30 ENCOUNTER — Ambulatory Visit: Payer: 59 | Admitting: Internal Medicine

## 2017-08-07 ENCOUNTER — Ambulatory Visit
Admission: RE | Admit: 2017-08-07 | Discharge: 2017-08-07 | Disposition: A | Discharge status: Home / Self Care | Payer: 59 | Source: Ambulatory Visit | Attending: Internal Medicine | Admitting: Internal Medicine

## 2017-08-07 DIAGNOSIS — Z1231 Encounter for screening mammogram for malignant neoplasm of breast: Secondary | ICD-10-CM

## 2017-08-08 MED FILL — ROSUVASTATIN CALCIUM 10 MG: 10 | 84 days supply | Qty: 36 | Fill #2

## 2017-08-23 DIAGNOSIS — M5416 Radiculopathy, lumbar region: Secondary | ICD-10-CM | POA: Diagnosis not present

## 2017-08-23 DIAGNOSIS — M545 Low back pain: Secondary | ICD-10-CM | POA: Diagnosis not present

## 2017-09-02 DIAGNOSIS — H524 Presbyopia: Secondary | ICD-10-CM | POA: Diagnosis not present

## 2017-09-19 ENCOUNTER — Ambulatory Visit: Payer: 59 | Admitting: Internal Medicine

## 2017-10-19 MED FILL — ROSUVASTATIN CALCIUM 10 MG: 10 | 84 days supply | Qty: 36 | Fill #3

## 2018-01-25 MED FILL — ROSUVASTATIN CALCIUM 10 MG: 10 | 84 days supply | Qty: 36 | Fill #4

## 2018-03-05 DIAGNOSIS — Z1211 Encounter for screening for malignant neoplasm of colon: Secondary | ICD-10-CM | POA: Diagnosis not present

## 2018-04-09 MED FILL — ROSUVASTATIN CALCIUM 10 MG: 10 | 9 days supply | Qty: 4 | Fill #5

## 2018-04-17 MED FILL — ROSUVASTATIN CALCIUM 10 MG: 10 | 28 days supply | Qty: 12 | Fill #0

## 2018-04-17 MED FILL — GAVILYTE-G SOLUTION: 236 | 1 days supply | Qty: 4000 | Fill #0

## 2018-04-19 DIAGNOSIS — Z1211 Encounter for screening for malignant neoplasm of colon: Secondary | ICD-10-CM | POA: Diagnosis not present

## 2018-05-15 MED FILL — SCOPOLAMINE 1 MG/3DAYS PT72: 1 | 7 days supply | Qty: 2 | Fill #0

## 2018-05-21 MED FILL — ROSUVASTATIN CALCIUM 10 MG: 10 | 28 days supply | Qty: 12 | Fill #1

## 2018-06-14 MED FILL — ROSUVASTATIN CALCIUM 10 MG: 10 | 84 days supply | Qty: 36 | Fill #2

## 2018-07-03 DIAGNOSIS — E7800 Pure hypercholesterolemia, unspecified: Secondary | ICD-10-CM | POA: Diagnosis not present

## 2018-07-03 DIAGNOSIS — G43109 Migraine with aura, not intractable, without status migrainosus: Secondary | ICD-10-CM | POA: Diagnosis not present

## 2018-07-03 DIAGNOSIS — Z0000 Encounter for general adult medical examination without abnormal findings: Secondary | ICD-10-CM | POA: Diagnosis not present

## 2018-09-05 MED FILL — ROSUVASTATIN CALCIUM 10 MG: 10 | 84 days supply | Qty: 36 | Fill #0

## 2018-12-06 MED FILL — ROSUVASTATIN CALCIUM 10 MG: 10 | 84 days supply | Qty: 36 | Fill #1

## 2019-01-14 DIAGNOSIS — L304 Erythema intertrigo: Secondary | ICD-10-CM | POA: Diagnosis not present

## 2019-01-14 DIAGNOSIS — E7800 Pure hypercholesterolemia, unspecified: Secondary | ICD-10-CM | POA: Diagnosis not present

## 2019-01-14 DIAGNOSIS — E559 Vitamin D deficiency, unspecified: Secondary | ICD-10-CM | POA: Diagnosis not present

## 2019-01-14 DIAGNOSIS — G43109 Migraine with aura, not intractable, without status migrainosus: Secondary | ICD-10-CM | POA: Diagnosis not present

## 2019-01-14 DIAGNOSIS — R000 Tachycardia, unspecified: Secondary | ICD-10-CM | POA: Diagnosis not present

## 2019-02-24 DIAGNOSIS — R21 Rash and other nonspecific skin eruption: Secondary | ICD-10-CM | POA: Diagnosis not present

## 2019-02-24 DIAGNOSIS — R509 Fever, unspecified: Secondary | ICD-10-CM | POA: Diagnosis not present

## 2019-02-24 MED FILL — predniSONE 10 MG (21) TBPK: 10 | 6 days supply | Qty: 21 | Fill #0

## 2019-02-25 ENCOUNTER — Other Ambulatory Visit: Payer: Self-pay

## 2019-02-25 DIAGNOSIS — Z20822 Contact with and (suspected) exposure to covid-19: Secondary | ICD-10-CM

## 2019-02-26 LAB — NOVEL CORONAVIRUS, NAA: SARS-CoV-2, NAA: NOT DETECTED

## 2019-03-07 DIAGNOSIS — B372 Candidiasis of skin and nail: Secondary | ICD-10-CM | POA: Diagnosis not present

## 2019-03-08 MED FILL — FLUCONAZOLE 150 MG TABS: 150 | 2 days supply | Qty: 2 | Fill #0

## 2019-03-08 MED FILL — FLUARIX QUADRIVALENT 0.5 ML: 0.5 | 1 days supply | Qty: 1 | Fill #0

## 2019-03-10 MED FILL — ROSUVASTATIN CALCIUM 10 MG: 10 | 84 days supply | Qty: 36 | Fill #0

## 2019-05-04 DIAGNOSIS — H524 Presbyopia: Secondary | ICD-10-CM | POA: Diagnosis not present

## 2019-06-03 MED FILL — ROSUVASTATIN CALCIUM 10 MG: 10 | 84 days supply | Qty: 36 | Fill #1

## 2019-07-29 ENCOUNTER — Encounter (HOSPITAL_COMMUNITY): Payer: Self-pay | Admitting: Emergency Medicine

## 2019-07-29 ENCOUNTER — Observation Stay (HOSPITAL_COMMUNITY): Payer: 59

## 2019-07-29 ENCOUNTER — Inpatient Hospital Stay (HOSPITAL_COMMUNITY)
Admission: EM | Admit: 2019-07-29 | Discharge: 2019-07-30 | DRG: 041 | Disposition: A | Discharge status: Home / Self Care | Payer: 59 | Attending: Internal Medicine | Admitting: Internal Medicine

## 2019-07-29 ENCOUNTER — Emergency Department (HOSPITAL_COMMUNITY): Payer: 59

## 2019-07-29 ENCOUNTER — Observation Stay (HOSPITAL_COMMUNITY)
Admit: 2019-07-29 | Discharge: 2019-07-29 | Disposition: A | Discharge status: Home / Self Care | Payer: 59 | Attending: Nurse Practitioner | Admitting: Nurse Practitioner

## 2019-07-29 ENCOUNTER — Other Ambulatory Visit: Payer: Self-pay

## 2019-07-29 DIAGNOSIS — R1310 Dysphagia, unspecified: Secondary | ICD-10-CM

## 2019-07-29 DIAGNOSIS — Q211 Atrial septal defect: Secondary | ICD-10-CM | POA: Diagnosis not present

## 2019-07-29 DIAGNOSIS — I639 Cerebral infarction, unspecified: Secondary | ICD-10-CM

## 2019-07-29 DIAGNOSIS — I6340 Cerebral infarction due to embolism of unspecified cerebral artery: Secondary | ICD-10-CM | POA: Diagnosis present

## 2019-07-29 DIAGNOSIS — I739 Peripheral vascular disease, unspecified: Secondary | ICD-10-CM | POA: Diagnosis present

## 2019-07-29 DIAGNOSIS — R29702 NIHSS score 2: Secondary | ICD-10-CM | POA: Diagnosis present

## 2019-07-29 DIAGNOSIS — Z20822 Contact with and (suspected) exposure to covid-19: Secondary | ICD-10-CM | POA: Diagnosis present

## 2019-07-29 DIAGNOSIS — Z8669 Personal history of other diseases of the nervous system and sense organs: Secondary | ICD-10-CM | POA: Diagnosis not present

## 2019-07-29 DIAGNOSIS — R2990 Unspecified symptoms and signs involving the nervous system: Secondary | ICD-10-CM | POA: Diagnosis not present

## 2019-07-29 DIAGNOSIS — R739 Hyperglycemia, unspecified: Secondary | ICD-10-CM | POA: Diagnosis present

## 2019-07-29 DIAGNOSIS — J45909 Unspecified asthma, uncomplicated: Secondary | ICD-10-CM | POA: Diagnosis not present

## 2019-07-29 DIAGNOSIS — Z823 Family history of stroke: Secondary | ICD-10-CM

## 2019-07-29 DIAGNOSIS — I6389 Other cerebral infarction: Secondary | ICD-10-CM

## 2019-07-29 DIAGNOSIS — G8194 Hemiplegia, unspecified affecting left nondominant side: Secondary | ICD-10-CM | POA: Diagnosis present

## 2019-07-29 DIAGNOSIS — R29818 Other symptoms and signs involving the nervous system: Secondary | ICD-10-CM | POA: Diagnosis not present

## 2019-07-29 DIAGNOSIS — R29898 Other symptoms and signs involving the musculoskeletal system: Secondary | ICD-10-CM | POA: Diagnosis present

## 2019-07-29 DIAGNOSIS — Z6828 Body mass index (BMI) 28.0-28.9, adult: Secondary | ICD-10-CM

## 2019-07-29 DIAGNOSIS — E7800 Pure hypercholesterolemia, unspecified: Secondary | ICD-10-CM | POA: Diagnosis present

## 2019-07-29 DIAGNOSIS — E663 Overweight: Secondary | ICD-10-CM | POA: Diagnosis present

## 2019-07-29 DIAGNOSIS — E785 Hyperlipidemia, unspecified: Secondary | ICD-10-CM | POA: Diagnosis not present

## 2019-07-29 DIAGNOSIS — Z8249 Family history of ischemic heart disease and other diseases of the circulatory system: Secondary | ICD-10-CM | POA: Diagnosis not present

## 2019-07-29 DIAGNOSIS — R002 Palpitations: Secondary | ICD-10-CM | POA: Diagnosis present

## 2019-07-29 DIAGNOSIS — I340 Nonrheumatic mitral (valve) insufficiency: Secondary | ICD-10-CM | POA: Diagnosis not present

## 2019-07-29 DIAGNOSIS — R4701 Aphasia: Secondary | ICD-10-CM | POA: Diagnosis not present

## 2019-07-29 DIAGNOSIS — Z03818 Encounter for observation for suspected exposure to other biological agents ruled out: Secondary | ICD-10-CM | POA: Diagnosis not present

## 2019-07-29 DIAGNOSIS — I5189 Other ill-defined heart diseases: Secondary | ICD-10-CM

## 2019-07-29 DIAGNOSIS — G459 Transient cerebral ischemic attack, unspecified: Secondary | ICD-10-CM | POA: Diagnosis not present

## 2019-07-29 DIAGNOSIS — R531 Weakness: Secondary | ICD-10-CM | POA: Diagnosis not present

## 2019-07-29 DIAGNOSIS — Z79899 Other long term (current) drug therapy: Secondary | ICD-10-CM | POA: Diagnosis not present

## 2019-07-29 LAB — DIFFERENTIAL
Abs Immature Granulocytes: 0.02 10*3/uL (ref 0.00–0.07)
Basophils Absolute: 0.1 10*3/uL (ref 0.0–0.1)
Basophils Relative: 1 %
Eosinophils Absolute: 0.5 10*3/uL (ref 0.0–0.5)
Eosinophils Relative: 7 %
Immature Granulocytes: 0 %
Lymphocytes Relative: 50 %
Lymphs Abs: 4.2 10*3/uL — ABNORMAL HIGH (ref 0.7–4.0)
Monocytes Absolute: 0.6 10*3/uL (ref 0.1–1.0)
Monocytes Relative: 7 %
Neutro Abs: 3 10*3/uL (ref 1.7–7.7)
Neutrophils Relative %: 35 %

## 2019-07-29 LAB — COMPREHENSIVE METABOLIC PANEL
ALT: 27 U/L (ref 0–44)
AST: 21 U/L (ref 15–41)
Albumin: 4 g/dL (ref 3.5–5.0)
Alkaline Phosphatase: 52 U/L (ref 38–126)
Anion gap: 9 (ref 5–15)
BUN: 15 mg/dL (ref 6–20)
CO2: 26 mmol/L (ref 22–32)
Calcium: 9.7 mg/dL (ref 8.9–10.3)
Chloride: 103 mmol/L (ref 98–111)
Creatinine, Ser: 0.88 mg/dL (ref 0.44–1.00)
GFR calc Af Amer: >60 mL/min (ref >60)
GFR calc non Af Amer: >60 mL/min (ref >60)
Glucose, Bld: 134 mg/dL — ABNORMAL HIGH (ref 70–99)
Potassium: 3.7 mmol/L (ref 3.5–5.1)
Sodium: 138 mmol/L (ref 135–145)
Total Bilirubin: 0.4 mg/dL (ref 0.3–1.2)
Total Protein: 6.9 g/dL (ref 6.5–8.1)

## 2019-07-29 LAB — HIV ANTIBODY (ROUTINE TESTING W REFLEX): HIV Screen 4th Generation wRfx: NONREACTIVE

## 2019-07-29 LAB — CBC
HCT: 41.5 % (ref 36.0–46.0)
Hemoglobin: 13.3 g/dL (ref 12.0–15.0)
MCH: 30.7 pg (ref 26.0–34.0)
MCHC: 32 g/dL (ref 30.0–36.0)
MCV: 95.8 fL (ref 80.0–100.0)
Platelets: 369 10*3/uL (ref 150–400)
RBC: 4.33 MIL/uL (ref 3.87–5.11)
RDW: 11.8 % (ref 11.5–15.5)
WBC: 8.4 10*3/uL (ref 4.0–10.5)
nRBC: 0 % (ref 0.0–0.2)

## 2019-07-29 LAB — LIPID PANEL
Cholesterol: 241 mg/dL — ABNORMAL HIGH (ref 0–200)
HDL: 51 mg/dL (ref >40)
LDL Cholesterol: 151 mg/dL — ABNORMAL HIGH (ref 0–99)
Total CHOL/HDL Ratio: 4.7 RATIO
Triglycerides: 193 mg/dL — ABNORMAL HIGH (ref <150)
VLDL: 39 mg/dL (ref 0–40)

## 2019-07-29 LAB — I-STAT CHEM 8, ED
BUN: 16 mg/dL (ref 6–20)
Calcium, Ion: 1.25 mmol/L (ref 1.15–1.40)
Chloride: 103 mmol/L (ref 98–111)
Creatinine, Ser: 0.9 mg/dL (ref 0.44–1.00)
Glucose, Bld: 126 mg/dL — ABNORMAL HIGH (ref 70–99)
HCT: 41 % (ref 36.0–46.0)
Hemoglobin: 13.9 g/dL (ref 12.0–15.0)
Potassium: 3.7 mmol/L (ref 3.5–5.1)
Sodium: 141 mmol/L (ref 135–145)
TCO2: 28 mmol/L (ref 22–32)

## 2019-07-29 LAB — HEMOGLOBIN A1C
Hgb A1c MFr Bld: 5.3 % (ref 4.8–5.6)
Mean Plasma Glucose: 105.41 mg/dL

## 2019-07-29 LAB — CBG MONITORING, ED: Glucose-Capillary: 130 mg/dL — ABNORMAL HIGH (ref 70–99)

## 2019-07-29 LAB — I-STAT BETA HCG BLOOD, ED (MC, WL, AP ONLY): I-stat hCG, quantitative: <5 m[IU]/mL (ref <5)

## 2019-07-29 LAB — PROTIME-INR
INR: 0.8 (ref 0.8–1.2)
Prothrombin Time: 11.2 seconds — ABNORMAL LOW (ref 11.4–15.2)

## 2019-07-29 LAB — ECHOCARDIOGRAM COMPLETE
Height: 64 in
Weight: 2698.43 oz

## 2019-07-29 LAB — SARS CORONAVIRUS 2 (TAT 6-24 HRS): SARS Coronavirus 2: NEGATIVE

## 2019-07-29 LAB — APTT: aPTT: 27 seconds (ref 24–36)

## 2019-07-29 MED ORDER — ASPIRIN EC 325 MG PO TBEC
325.0000 mg | DELAYED_RELEASE_TABLET | Freq: Every day | ORAL | Status: DC
  Administered 2019-07-29: 325 mg via ORAL
  Filled 2019-07-29: qty 1

## 2019-07-29 MED ORDER — SUMATRIPTAN SUCCINATE 100 MG PO TABS
100.0000 mg | ORAL_TABLET | ORAL | Status: DC | PRN
  Filled 2019-07-29: qty 1

## 2019-07-29 MED ORDER — CLOPIDOGREL BISULFATE 75 MG PO TABS
75.0000 mg | ORAL_TABLET | Freq: Every day | ORAL | Status: DC
  Administered 2019-07-29 – 2019-07-30 (×2): 75 mg via ORAL
  Filled 2019-07-29 (×2): qty 1

## 2019-07-29 MED ORDER — STROKE: EARLY STAGES OF RECOVERY BOOK
Freq: Once | Status: AC
  Filled 2019-07-29: qty 1

## 2019-07-29 MED ORDER — ENOXAPARIN SODIUM 40 MG/0.4ML ~~LOC~~ SOLN
40.0000 mg | SUBCUTANEOUS | Status: DC
  Administered 2019-07-29 – 2019-07-30 (×2): 40 mg via SUBCUTANEOUS
  Filled 2019-07-29 (×2): qty 0.4

## 2019-07-29 MED ORDER — IOHEXOL 350 MG/ML SOLN
100.0000 mL | Freq: Once | INTRAVENOUS | Status: AC | PRN
  Administered 2019-07-29: 100 mL via INTRAVENOUS

## 2019-07-29 MED ORDER — ASPIRIN EC 81 MG PO TBEC
81.0000 mg | DELAYED_RELEASE_TABLET | Freq: Every day | ORAL | Status: DC
  Administered 2019-07-30: 81 mg via ORAL
  Filled 2019-07-29: qty 1

## 2019-07-29 MED ORDER — ACETAMINOPHEN 160 MG/5ML PO SOLN: 650.0000 mg | ORAL | Status: DC | PRN

## 2019-07-29 MED ORDER — ROSUVASTATIN CALCIUM 20 MG PO TABS
40.0000 mg | ORAL_TABLET | Freq: Every day | ORAL | Status: DC
  Administered 2019-07-29 – 2019-07-30 (×2): 40 mg via ORAL
  Filled 2019-07-29 (×2): qty 2

## 2019-07-29 MED ORDER — SODIUM CHLORIDE 0.9 % IV SOLN: Freq: Once | INTRAVENOUS | Status: AC

## 2019-07-29 MED ORDER — ROSUVASTATIN CALCIUM 5 MG PO TABS: 10.0000 mg | ORAL_TABLET | ORAL | Status: DC

## 2019-07-29 MED ORDER — SENNOSIDES-DOCUSATE SODIUM 8.6-50 MG PO TABS: 1.0000 | ORAL_TABLET | Freq: Every evening | ORAL | Status: DC | PRN

## 2019-07-29 MED ORDER — ACETAMINOPHEN 650 MG RE SUPP: 650.0000 mg | RECTAL | Status: DC | PRN

## 2019-07-29 MED ORDER — SODIUM CHLORIDE 0.9% FLUSH: 3.0000 mL | Freq: Once | INTRAVENOUS | Status: DC

## 2019-07-29 MED ORDER — ACETAMINOPHEN 325 MG PO TABS: 650.0000 mg | ORAL_TABLET | ORAL | Status: DC | PRN

## 2019-07-29 NOTE — ED Triage Notes (Addendum)
Pt in POV w/L hand weakness, trouble swallowing, and aphasia that she noticed when waking at 0315. LSN 2250 when she woke to use restroom. States she feels her speech is better, but her L thumb and grip strength is still weak. Code stroke activated

## 2019-07-29 NOTE — ED Notes (Signed)
[facility] called to activate code stroke per RN Tobi Bastos and Dr. Wilkie Aye

## 2019-07-29 NOTE — Evaluation (Signed)
Physical Therapy Evaluation Patient Details Name: Tara [name] MRN: [id number] DOB: [birth year] Today's Date: 07/29/2019   History of Present Illness  54 year old female with a history of asthma, high cholesterol, migraine who presents with difficulty with word finding, difficulty swallowing and weakness in her left fingers. 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  Clinical Impression  Pt admitted with above diagnosis. Pt presents with sensory and strength changes LUE with minimal symptoms LLE but did have light headed feeling during ambulation and frequently reached for stable surfaces. In addition, after completing ambulation and 3 stairs she felt tingling L sided face, HR 115 bpm, SPO2 upper 90s on RA.  Pt currently with functional limitations due to the deficits listed below (see PT Problem List). Pt will benefit from skilled PT to increase their independence and safety with mobility to allow discharge to the venue listed below.       Follow Up Recommendations Outpatient PT    Equipment Recommendations  None recommended by PT    Recommendations for Other Services       Precautions / Restrictions Precautions Precautions: None Restrictions Weight Bearing Restrictions: No      Mobility  Bed Mobility Overal bed mobility: Independent                Transfers Overall transfer level: Needs assistance Equipment used: None Transfers: Sit to/from Stand Sit to Stand: Supervision         General transfer comment: pt stood with increased time but no physical assist needed. No LOB but pt reaching for stable surfaces  Ambulation/Gait Ambulation/Gait assistance: Min guard;Min assist Gait Distance (Feet): 250 Feet Assistive device: None Gait Pattern/deviations: Step-through pattern(guarded) Gait velocity: decreased Gait velocity interpretation: >2.62 ft/sec, indicative of  community ambulatory General Gait Details: pt felt light headed with gait, HR 115 bpm, SpO2 upper 90's. Min A progressing to min-guard. Pt frequently reaching for hallway railing and slowed down considerably in open spaces when no rail was available.   Stairs Stairs: Yes Stairs assistance: Min assist Stair Management: One rail Right;Forwards;Alternating pattern Number of Stairs: 3 General stair comments: no difficulty on stairs but as she completed she felt like her heart was racing and felt some tingling L face  Wheelchair Mobility    Modified Rankin (Stroke Patients Only) Modified Rankin (Stroke Patients Only) Pre-Morbid Rankin Score: No symptoms Modified Rankin: Moderate disability     Balance Overall balance assessment: Needs assistance Sitting-balance support: No upper extremity supported Sitting balance-Leahy Scale: Good     Standing balance support: No upper extremity supported Standing balance-Leahy Scale: Good Standing balance comment: limitations evident with challenge                             Pertinent Vitals/Pain Pain Assessment: No/denies pain    Home Living Family/patient expects to be discharged to:: Private residence Living Arrangements: Spouse/significant other Available Help at Discharge: Family;Available PRN/intermittently Type of Home: House Home Access: Stairs to enter     Home Layout: Multi-level;1/2 bath on main level Home Equipment: None Additional Comments: pt's husband works    Prior Function Level of Independence: Independent         Comments: pt works here at [employer] as does her sister in Sports coach and 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 [employer]   Dominant Hand: Right    Extremity/Trunk Assessment   Upper Extremity  Assessment Upper Extremity Assessment: Defer to OT evaluation;LUE deficits/detail LUE Deficits / Details: decreased coordination L hand LUE Coordination: decreased fine motor    Lower Extremity Assessment Lower  Extremity Assessment: LLE deficits/detail LLE Deficits / Details: pt tests WFL on MMT, mild coordination issues noted with ambulation and pt had sensation that LLE did not feel "quite right" LLE Sensation: WNL LLE Coordination: decreased gross motor    Cervical / Trunk Assessment Cervical / Trunk Assessment: Normal  Communication   Communication: Other (comment);Expressive difficulties(mildly slowed speech and some word finding)  Cognition Arousal/Alertness: Awake/alert Behavior During Therapy: WFL for tasks assessed/performed Overall Cognitive Status: Within Functional Limits for tasks assessed                                        General Comments General comments (skin integrity, edema, etc.): discussed stroke sxs, BEFAST, pt able to verbalize    Exercises     Assessment/Plan    PT Assessment Patient needs continued PT services  PT Problem List Decreased balance;Decreased mobility;Decreased coordination;Decreased knowledge of precautions       PT Treatment Interventions Gait training;Stair training;Functional mobility training;Therapeutic activities;Therapeutic exercise;Balance training;Patient/family education    PT Goals (Current goals can be found in the Care Plan section)  Acute Rehab PT Goals Patient Stated Goal: get better PT Goal Formulation: With patient Time For Goal Achievement: 08/12/19 Potential to Achieve Goals: Good    Frequency Min 4X/week   Barriers to discharge Inaccessible home environment stairs    [name]-evaluation               AM-PAC PT "6 Clicks" Mobility  Outcome Measure Help needed turning from your back to your side while in a flat bed without using bedrails?: None Help needed moving from lying on your back to sitting on the side of a flat bed without using bedrails?: None Help needed moving to and from a bed to a chair (including a wheelchair)?: None Help needed standing up from a chair using your arms (e.g.,  wheelchair or bedside chair)?: None Help needed to walk in hospital room?: A Little Help needed climbing 3-5 steps with a railing? : A Little 6 Click Score: 22    End of Session Equipment Utilized During Treatment: Gait belt Activity Tolerance: Patient tolerated treatment well Patient left: in bed;with call bell/phone within reach;with bed alarm set;with family/visitor present Nurse Communication: Mobility status PT Visit Diagnosis: Unsteadiness on feet (R26.81);Dizziness and giddiness (R42);Difficulty in walking, not elsewhere classified (R26.2)    Time: 1324-4010 PT Time Calculation (min) (ACUTE ONLY): 31 min   Charges:   PT Evaluation $PT Eval Moderate Complexity: 1 Mod PT Treatments $Gait Training: 8-22 mins        Tara [name], PT  Acute Rehab Services  Pager [phone] Office [phone]   Lawana Chambers Dyana Magner 07/29/2019, 4:48 PM

## 2019-07-29 NOTE — Progress Notes (Signed)
STROKE TEAM PROGRESS NOTE   INTERVAL HISTORY I have personally reviewed history of presenting illness with the patient and husband as well as electronic medical records and imaging films in PACS.  She had mild headache last night but was fine.  She woke up at around 3 in the morning with speech and word finding difficulties as well as left hand weakness with some dystonic posture.  The symptoms got worse this morning prompting her to come to the ER.  She has noticed improvement since she is arrived here.  She has no prior history of complicated migraine, seizures, TIA or strokes.  There is family history of heart attacks and strokes in the 8550s.  She denies being on birth control pills or taking hormone replacements or smoking.  There is no history of DVT, pulmonary embolism or recurrent miscarriages  Vitals:   07/29/19 0515 07/29/19 0530 07/29/19 0730 07/29/19 0815  BP: 121/90 125/89 (!) 128/93 (!) 138/99  Pulse: 84 89 77 88  Resp: 20 20 20 14   Temp:      TempSrc:      SpO2: 97% 98% 97% 97%  Weight:      Height:        CBC:  Recent Labs  Lab 07/29/19 0402 07/29/19 0406  WBC  --  8.4  NEUTROABS  --  3.0  HGB 13.9 13.3  HCT 41.0 41.5  MCV  --  95.8  PLT  --  369    Basic Metabolic Panel:  Recent Labs  Lab 07/29/19 0402 07/29/19 0406  NA 141 138  K 3.7 3.7  CL 103 103  CO2  --  26  GLUCOSE 126* 134*  BUN 16 15  CREATININE 0.90 0.88  CALCIUM  --  9.7   Lipid Panel:     Component Value Date/Time   CHOL 229 (H) 06/22/2014 1202   TRIG 141 06/22/2014 1202   HDL 49 06/22/2014 1202   CHOLHDL 4.7 06/22/2014 1202   VLDL 28 06/22/2014 1202   LDLCALC 152 (H) 06/22/2014 1202   HgbA1c: No results found for: HGBA1C Urine Drug Screen: No results found for: LABOPIA, COCAINSCRNUR, LABBENZ, AMPHETMU, THCU, LABBARB  Alcohol Level [facility] results found for: [facility]  IMAGING past 24 hours CT Code Stroke CTA Head W/WO contrast  Result Date: 07/29/2019 CLINICAL DATA:  Left-sided weakness.  Aphasia. Symptoms are improving. Abnormal CT of the head. EXAM: CT ANGIOGRAPHY HEAD AND NECK CT PERFUSION BRAIN TECHNIQUE: Multidetector CT imaging of the head and neck was performed using the standard protocol during bolus administration of intravenous contrast. Multiplanar CT image reconstructions and MIPs were obtained to evaluate the vascular anatomy. Carotid stenosis measurements (when applicable) are obtained utilizing NASCET criteria, using the distal internal carotid diameter as the denominator. Multiphase CT imaging of the brain was performed following IV bolus contrast injection. Subsequent parametric perfusion maps were calculated using RAPID software. CONTRAST:  100mL OMNIPAQUE IOHEXOL 350 MG/ML SOLN COMPARISON:  None. FINDINGS: CTA NECK FINDINGS Aortic arch: A 3 vessel arch configuration is present. No significant atherosclerotic changes present. No aneurysm or stenosis is present. Right carotid system: The right common carotid artery is within normal limits. The bifurcation is unremarkable. Cervical right ICA is normal. Left carotid system: Mild tortuosity is present in the proximal left common carotid artery without significant stenosis. The bifurcation is within normal limits. Cervical left ICA is normal. Vertebral arteries: Both vertebral arteries originate from the subclavian arteries. The left vertebral artery is slightly larger. No significant stenosis is  present in either vertebral artery in the neck. Skeleton: Some straightening of the normal cervical lordosis is noted. No significant listhesis is present. Vertebral body heights and alignment are normal. Other neck: The soft tissues of the neck are otherwise normal. Upper chest: Mild ground-glass attenuation is present at the lung apices bilaterally, likely reflecting atelectasis or edema. No significant airspace consolidation is present. Thoracic inlet is normal. Review of the MIP images confirms the above findings CTA HEAD FINDINGS Anterior  circulation: Minimal atherosclerotic changes are present within the cavernous internal carotid arteries bilaterally. No significant stenosis is present through the ICA termini. Mild irregularity is present in the left A1 segment without significant stenosis. MCA bifurcations are intact. There is some irregularity of distal ACA and MCA branch vessels without a significant proximal stenosis or occlusion. Posterior circulation: The left vertebral artery is dominant. PICA origins are visualized and normal. Vertebrobasilar junction is normal. The basilar artery is normal. Both posterior cerebral arteries originate from the basilar tip. Irregularity of distal PCA branch vessels is present without a significant proximal stenosis or occlusion. Venous sinuses: The dural sinuses are patent. The straight sinus deep cerebral veins are intact. Cortical veins are unremarkable. No vascular malformation present. Anatomic variants: None Review of the MIP images confirms the above findings CT Brain Perfusion Findings: ASPECTS: 10/10 CBF (<30%) Volume: 12mL Perfusion (Tmax>6.0s) volume: 62mL Mismatch Volume: 84mL Infarction Location:n/a IMPRESSION: 1. No emergent large vessel occlusion. 2. Normal CT perfusion of the head. 3. Mild distal small vessel irregularity in both the anterior and posterior circulation suggesting intracranial atherosclerotic change without a proximal stenosis, aneurysm or branch vessel occlusion. 4. No significant stenosis in the neck. The above was relayed via text pager to Dr. Ritta Slot on 07/29/2019 at 04:53 . Electronically Signed   By: Marin Roberts M.D.   On: 07/29/2019 05:00   DG Chest 2 View  Result Date: 07/29/2019 CLINICAL DATA:  TIA at 3 a.m. this morning EXAM: CHEST - 2 VIEW COMPARISON:  05/31/2015 FINDINGS: Normal heart size, mediastinal contours, and pulmonary vascularity. Lungs clear. No pleural effusion or pneumothorax. Bones unremarkable. IMPRESSION: No acute abnormalities.  Electronically Signed   By: Ulyses Southward M.D.   On: 07/29/2019 09:00   CT Code Stroke CTA Neck W/WO contrast  Result Date: 07/29/2019 CLINICAL DATA:  Left-sided weakness. Aphasia. Symptoms are improving. Abnormal CT of the head. EXAM: CT ANGIOGRAPHY HEAD AND NECK CT PERFUSION BRAIN TECHNIQUE: Multidetector CT imaging of the head and neck was performed using the standard protocol during bolus administration of intravenous contrast. Multiplanar CT image reconstructions and MIPs were obtained to evaluate the vascular anatomy. Carotid stenosis measurements (when applicable) are obtained utilizing NASCET criteria, using the distal internal carotid diameter as the denominator. Multiphase CT imaging of the brain was performed following IV bolus contrast injection. Subsequent parametric perfusion maps were calculated using RAPID software. CONTRAST:  OMNIPAQUE IOHEXOL 350 MG/ML SOLN COMPARISON:  None. FINDINGS: CTA NECK FINDINGS Aortic arch: A 3 vessel arch configuration is present. No significant atherosclerotic changes present. No aneurysm or stenosis is present. Right carotid system: The right common carotid artery is within normal limits. The bifurcation is unremarkable. Cervical right ICA is normal. Left carotid system: Mild tortuosity is present in the proximal left common carotid artery without significant stenosis. The bifurcation is within normal limits. Cervical left ICA is normal. Vertebral arteries: Both vertebral arteries originate from the subclavian arteries. The left vertebral artery is slightly larger. No significant stenosis is present in either vertebral  artery in the neck. Skeleton: Some straightening of the normal cervical lordosis is noted. No significant listhesis is present. Vertebral body heights and alignment are normal. Other neck: The soft tissues of the neck are otherwise normal. Upper chest: Mild ground-glass attenuation is present at the lung apices bilaterally, likely reflecting  atelectasis or edema. No significant airspace consolidation is present. Thoracic inlet is normal. Review of the MIP images confirms the above findings CTA HEAD FINDINGS Anterior circulation: Minimal atherosclerotic changes are present within the cavernous internal carotid arteries bilaterally. No significant stenosis is present through the ICA termini. Mild irregularity is present in the left A1 segment without significant stenosis. MCA bifurcations are intact. There is some irregularity of distal ACA and MCA branch vessels without a significant proximal stenosis or occlusion. Posterior circulation: The left vertebral artery is dominant. PICA origins are visualized and normal. Vertebrobasilar junction is normal. The basilar artery is normal. Both posterior cerebral arteries originate from the basilar tip. Irregularity of distal PCA branch vessels is present without a significant proximal stenosis or occlusion. Venous sinuses: The dural sinuses are patent. The straight sinus deep cerebral veins are intact. Cortical veins are unremarkable. No vascular malformation present. Anatomic variants: None Review of the MIP images confirms the above findings CT Brain Perfusion Findings: ASPECTS: 10/10 CBF (<30%) Volume: 64mL Perfusion (Tmax>6.0s) volume: 25mL Mismatch Volume: 75mL Infarction Location:n/a IMPRESSION: 1. No emergent large vessel occlusion. 2. Normal CT perfusion of the head. 3. Mild distal small vessel irregularity in both the anterior and posterior circulation suggesting intracranial atherosclerotic change without a proximal stenosis, aneurysm or branch vessel occlusion. 4. No significant stenosis in the neck. The above was relayed via text pager to Dr. Roland Rack on 07/29/2019 at 04:53 . Electronically Signed   By: San Morelle M.D.   On: 07/29/2019 05:00   MR BRAIN WO CONTRAST  Result Date: 07/29/2019 CLINICAL DATA:  54 year old female code stroke presentation this morning left side weakness,  aphasia. EXAM: MRI HEAD WITHOUT CONTRAST TECHNIQUE: Multiplanar, multiecho pulse sequences of the brain and surrounding structures were obtained without intravenous contrast. COMPARISON:  Plain CT head, CTA and CTP earlier today. FINDINGS: Brain: There are 2 tiny foci of diffusion restriction in the right hemisphere. A small cortical focus at the right middle frontal gyrus is noted on series 5, image 84. There is a small right parietal cortical or subcortical focus on image 80. No other restricted diffusion. There are several small chronic infarcts in the cerebellum, more so the left. No midline shift, mass effect, evidence of mass lesion, ventriculomegaly, extra-axial collection or acute intracranial hemorrhage. Cervicomedullary junction and pituitary are within normal limits. No chronic cortical encephalomalacia identified. There is a small focus of chronic microhemorrhage, or perhaps a very distal vascular thrombus in the right parietal lobe near the DWI abnormality on series 14, image 36. Deep gray nuclei and brainstem are negative. Vascular: Major intracranial vascular flow voids are preserved. Skull and upper cervical spine: Negative. Sinuses/Orbits: Negative orbits. Small fluid level in the right maxillary sinus with mild mucosal thickening elsewhere. Other: Mastoids are clear. Grossly negative internal auditory structures. Scalp and face soft tissues appear negative. IMPRESSION: 1. Two tiny acute cortical infarcts in the right hemisphere: Posterior right middle frontal gyrus and posterior right parietal lobe. No hemorrhagic transformation or associated mass effect. 2. Small chronic cerebellar infarcts, most on the left. 3. Mild paranasal sinus sinus inflammation. Electronically Signed   By: Genevie Ann M.D.   On: 07/29/2019 09:25   CT  Code Stroke Cerebral Perfusion with contrast  Result Date: 07/29/2019 CLINICAL DATA:  Left-sided weakness. Aphasia. Symptoms are improving. Abnormal CT of the head. EXAM: CT  ANGIOGRAPHY HEAD AND NECK CT PERFUSION BRAIN TECHNIQUE: Multidetector CT imaging of the head and neck was performed using the standard protocol during bolus administration of intravenous contrast. Multiplanar CT image reconstructions and MIPs were obtained to evaluate the vascular anatomy. Carotid stenosis measurements (when applicable) are obtained utilizing NASCET criteria, using the distal internal carotid diameter as the denominator. Multiphase CT imaging of the brain was performed following IV bolus contrast injection. Subsequent parametric perfusion maps were calculated using RAPID software. CONTRAST:  OMNIPAQUE IOHEXOL 350 MG/ML SOLN COMPARISON:  None. FINDINGS: CTA NECK FINDINGS Aortic arch: A 3 vessel arch configuration is present. No significant atherosclerotic changes present. No aneurysm or stenosis is present. Right carotid system: The right common carotid artery is within normal limits. The bifurcation is unremarkable. Cervical right ICA is normal. Left carotid system: Mild tortuosity is present in the proximal left common carotid artery without significant stenosis. The bifurcation is within normal limits. Cervical left ICA is normal. Vertebral arteries: Both vertebral arteries originate from the subclavian arteries. The left vertebral artery is slightly larger. No significant stenosis is present in either vertebral artery in the neck. Skeleton: Some straightening of the normal cervical lordosis is noted. No significant listhesis is present. Vertebral body heights and alignment are normal. Other neck: The soft tissues of the neck are otherwise normal. Upper chest: Mild ground-glass attenuation is present at the lung apices bilaterally, likely reflecting atelectasis or edema. No significant airspace consolidation is present. Thoracic inlet is normal. Review of the MIP images confirms the above findings CTA HEAD FINDINGS Anterior circulation: Minimal atherosclerotic changes are present within the  cavernous internal carotid arteries bilaterally. No significant stenosis is present through the ICA termini. Mild irregularity is present in the left A1 segment without significant stenosis. MCA bifurcations are intact. There is some irregularity of distal ACA and MCA branch vessels without a significant proximal stenosis or occlusion. Posterior circulation: The left vertebral artery is dominant. PICA origins are visualized and normal. Vertebrobasilar junction is normal. The basilar artery is normal. Both posterior cerebral arteries originate from the basilar tip. Irregularity of distal PCA branch vessels is present without a significant proximal stenosis or occlusion. Venous sinuses: The dural sinuses are patent. The straight sinus deep cerebral veins are intact. Cortical veins are unremarkable. No vascular malformation present. Anatomic variants: None Review of the MIP images confirms the above findings CT Brain Perfusion Findings: ASPECTS: 10/10 CBF (<30%) Volume: 30mL Perfusion (Tmax>6.0s) volume: 42mL Mismatch Volume: 31mL Infarction Location:n/a IMPRESSION: 1. No emergent large vessel occlusion. 2. Normal CT perfusion of the head. 3. Mild distal small vessel irregularity in both the anterior and posterior circulation suggesting intracranial atherosclerotic change without a proximal stenosis, aneurysm or branch vessel occlusion. 4. No significant stenosis in the neck. The above was relayed via text pager to Dr. Ritta Slot on 07/29/2019 at 04:53 . Electronically Signed   By: Marin Roberts M.D.   On: 07/29/2019 05:00   CT HEAD CODE STROKE WO CONTRAST  Result Date: 07/29/2019 CLINICAL DATA:  Code stroke. Aphasia. Left-sided weakness. Difficulty swallowing. Last seen normal at 22:50 yesterday. EXAM: CT HEAD WITHOUT CONTRAST TECHNIQUE: Contiguous axial images were obtained from the base of the skull through the vertex without intravenous contrast. COMPARISON:  None. FINDINGS: Brain: No acute infarct,  hemorrhage, or mass lesion is present. Basal ganglia are intact.  Insular ribbon is normal. No acute or focal cortical abnormality is present. White matter is within normal limits. The ventricles are of normal size. No significant extraaxial fluid collection is present. Vascular: Focal hyperdensity is present at the right MCA bifurcation. No significant vascular calcifications are present. No other focal vascular hyperdensity is present. Skull: Calvarium is intact. No focal lytic or blastic lesions are present. No significant extracranial soft tissue lesion is present. Sinuses/Orbits: Mild mucosal thickening is present in the right greater than left ethmoid air cells. A fluid level is present in the right maxillary sinus. The paranasal sinuses and mastoid air cells are otherwise clear. The globes and orbits are within normal limits. ASPECTS [facility] Stroke Program Early CT Score) - Ganglionic level infarction (caudate, lentiform nuclei, internal capsule, insula, M1-M3 cortex): 7/7 - Supraganglionic infarction (M4-M6 cortex): 3/3 Total score (0-10 with 10 being normal): 10/10 IMPRESSION: 1. Focal hyperdensity at the right MCA bifurcation concerning for acute thrombus. Recommend CTA of the head and neck to exclude large vessel occlusion. 2. Normal CT appearance of the brain. 3. Right maxillary sinus disease. *ASPECTS is 10/10 Electronically Signed   By: Marin Roberts M.D.   On: 07/29/2019 04:17    PHYSICAL EXAM Pleasant middle-aged lady not in distress. . Afebrile. Head is nontraumatic. Neck is supple without bruit.    Cardiac exam no murmur or gallop. Lungs are clear to auscultation. Distal pulses are well felt. Neurological Exam ;  Awake  Alert oriented x 3. Normal speech and language.eye movements full without nystagmus.fundi were not visualized. Vision acuity and fields appear normal. Hearing is normal. Palatal movements are normal. Face symmetric. Tongue midline. Normal strength, tone, reflexes and  coordination. Normal sensation. Gait deferred.  NIHSS 0. Premorbid MRs 0 ASSESSMENT/PLAN Tara [name] is a 54 y.o. female with history of HLD, migraine and asthma presenting with HA since 2/28 who woke w/ LUE numbness and weakness, severe dysarthria and dysphagia.   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 No acute abnormality. R maxillary sinus dz. ASPECTS 10.     CTA head & neck no LVO. Mild distal SMALL VESSEL DISEASE anterior and posterior circulation. Neck ok   CT perfusion Unremarkable   MRI  2 tiny acute cortical R brain (posterior middle frontal gyrus and posterior R parietal lobe). Read as old cerebellar infarcts but  Dr. Pearlean Brownie feels they are flow voids and not old stroke. Mild paranasal sinus dz.   2D Echo pending   TEE to look for embolic source. Arranged with [facility] for tomorrow.  (I have made patient NPO after midnight tonight).  If TEE negative, a [facility] electrophysiologist will consult and consider placement of an implantable loop recorder to evaluate for atrial fibrillation as etiology of stroke. This has been explained to patient/family by Dr. Pearlean Brownie and they are agreeable.   EEG normal   LE dopplers no DVT   LDL pending   HgbA1c pending   UDS pending   TSH pending   Lovenox 40 mg sq daily for VTE prophylaxis  No antithrombotic prior to admission, now on aspirin 325 mg daily. Change aspirin to 81 and add plavix 75 mg daily. Continue DAPT x 3 weeks then aspirin alone  Therapy recommendations:  pending   Disposition:  pending   Hyperlipidemia  Home meds:  crestor 10 and fish oil, crestor resumed in hospital  LDL pending,  goal < 70  Continue statin at discharge  Hyperglycemia  No hx diabetes  HgbA1c pending, goal < 7.0  CBGs Recent Labs    07/29/19 0357  GLUCAP 130*      SSI  Other Stroke  Risk Factors  Overweight, Body mass index is 28.95 kg/m., recommend weight loss, diet and exercise as appropriate   Migraines, hormonal during menses, now in  menopausal state they are less than previously. 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  Recommend further work-up with telemetry monitoring lab work for vasculitis and hypercoagulability, TEE TCD bubble study for PFO.  Aspirin and Plavix for 3 weeks followed by aspirin alone.  Strict control of lipids.  Patient may also consider possible participation in the [employer] stroke prevention trial if interested.  She was given written information to review and decide. Greater than 50% time during the 35-minute visit was spent on counseling and coordination of care about her embolic stroke and discussion about evaluation, prevention and treatment and answering questions.  Discussed with patient and husband and Dr. Katrinka Blazing. Delia Heady, MD   To contact Stroke Continuity provider, please refer to [email or web address]. After hours, contact General Neurology

## 2019-07-29 NOTE — Progress Notes (Signed)
Portable EEG completed, results pending. 

## 2019-07-29 NOTE — ED Notes (Signed)
Pt in MRI.

## 2019-07-29 NOTE — Consult Note (Signed)
Neurology Consultation Reason for Consult: Left-sided weakness Referring Physician: Horton, C  CC: Left-sided weakness  History is obtained from: Patient  HPI: Tara [name] is a 54 y.o. female who went to bed in her normal state of health around 10:15 PM.  On awakening at 315, she noticed that she had numbness and weakness of the left arm as well as severe dysarthria and she tried to drink something and choked.  Dysarthria and choking have improved, but she still has persistent weakness of the left hand as well as numbness.  She denies any tingling, visual change, nausea and vomiting, or other acute symptoms.   LKW: 10:15 PM tpa given?: no, out of window    ROS: A 14 point ROS was performed and is negative except as noted in the HPI.   Past Medical History:  Diagnosis Date  . Asthma   . Hyperlipidemia   . Migraine   . Rotator cuff tear   . Spondylolisthesis    L4-5     Family History  Problem Relation Age of Onset  . Heart disease Father 8       MI  . Hypertension Maternal Grandmother   . Hyperlipidemia Brother      Social History:  reports that she has never smoked. She has never used smokeless tobacco. She reports that she does not drink alcohol or use drugs.   Exam: Current vital signs: BP 128/89 (BP Location: Right Arm)   Pulse 95   Temp (!) 97.4 F (36.3 C) (Oral)   Resp 16   Ht 5\' 4"  (1.626 m)   Wt 76.5 kg   LMP 11/24/2013   SpO2 97%   BMI 28.95 kg/m  Vital signs in last 24 hours: Temp:  [97.4 F (36.3 C)] 97.4 F (36.3 C) (03/02 0447) Pulse Rate:  [95-101] 95 (03/02 0415) Resp:  [16-22] 16 (03/02 0447) BP: (115-134)/(86-103) 128/89 (03/02 0447) SpO2:  [97 %-99 %] 97 % (03/02 0447) Weight:  [74.8 kg-76.5 kg] 76.5 kg (03/02 0447)   Physical Exam  Constitutional: Appears well-developed and well-nourished.  Psych: Affect appropriate to situation Eyes: No scleral injection HENT: No OP obstrucion MSK: no joint deformities.  Cardiovascular:  Normal rate and regular rhythm.  Respiratory: Effort normal, non-labored breathing GI: Soft.  No distension. There is no tenderness.  Skin: WDI  Neuro: Mental Status: Patient is awake, alert, oriented to person, place, month, year, and situation. Patient is able to give a clear and coherent history. No signs of aphasia or neglect Cranial Nerves: II: Visual Fields are full. Pupils are equal, round, and reactive to light.   III,IV, VI: EOMI without ptosis or diploplia.  V: Facial sensation is symmetric to temperature VII: Facial movement with very mild left facial weakness VIII: hearing is intact to voice X: Uvula elevates symmetrically XI: Shoulder shrug is symmetric. XII: tongue is midline without atrophy or fasciculations.  Motor: Tone is normal. Bulk is normal. 5/5 strength was present on the right, on the left she does not have any drift proximally, but does have some weakness of her hand. 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  She will need to be admitted for secondary risk factor  modification and therapy.  Recommendations: - HgbA1c, fasting lipid panel - MRI  of the brain without contrast - Frequent neuro checks - Echocardiogram - Prophylactic therapy-Antiplatelet med: Aspirin - dose 325mg  PO or 300mg  PR - Risk factor modification - Telemetry monitoring - PT consult, OT consult, Speech consult - Stroke team to follow    , MD [facility] [phone]  If 7pm- 7am, please page neurology on call as listed in AMION.

## 2019-07-29 NOTE — Progress Notes (Signed)
OT Evaluation  PTA, pt independent with ADL and mobility and works as Sports coach from [employer]. Pt presents with mild weakness and coordination deficits LUE and speech/swallowing deficits. Pt states her arm is "feeling much better" than this am. Will follow acutely and determine further needs for follow up OT, although at this time recommend neuro outpt OT to facilitate return to PLOF.  Assessment limited due to ECHO.    07/29/19 1700  OT Visit Information  Last OT Received On 07/29/19  Assistance Needed +1  History of Present Illness 54 year old female with a history of asthma, high cholesterol, migraine who presents with difficulty with word finding, difficulty swallowing and weakness in her left fingers. 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  Precautions  Precautions None  Restrictions  Weight Bearing Restrictions No  Home Living  Family/patient expects to be discharged to: Private residence  Living Arrangements Spouse/significant other  Available Help at Discharge Family;Available PRN/intermittently  Type of Home House  Home Access Stairs to enter  Home Layout Multi-level;1/2 bath on main level  Alternate Level Stairs-Number of Steps flight  Alternate Level Stairs-Rails Right  Bathroom Shower/Tub Tub/shower unit;Walk-in Corporate treasurer None  Additional Comments pt's husband works  Prior Function  Level of Independence Independent  Comments pt works here at [employer] as does her sister in Social worker and brother in Therapist, sports Other (comment);Expressive difficulties (mildly slowed speech and some word finding)  Pain Assessment  Pain Assessment No/denies pain  Cognition  Arousal/Alertness Awake/alert  Behavior During Therapy WFL for tasks assessed/performed  Overall Cognitive Status Within Functional Limits for tasks  assessed  Upper Extremity Assessment  Upper Extremity Assessment LUE deficits/detail  LUE Deficits / Details decreased coordination L hand; generalized weakness LUE; weaker grip; pt states her LUE has gotten better throughout the day  LUE Coordination decreased fine motor  Lower Extremity Assessment  Lower Extremity Assessment Defer to PT evaluation  Cervical / Trunk Assessment  Cervical / Trunk Assessment Normal  ADL  Overall ADL's  Needs assistance/impaired  Functional mobility during ADLs  (limited assessment during ECHO)  General ADL Comments Pt overall set up for ADL tasks; feels "slightly unsteady" when up and walking  Vision- History  Baseline Vision/History Wears glasses  Wears Glasses Reading only  Patient Visual Report No change from baseline  Vision- Assessment  Vision Assessment? No apparent visual deficits  Perception  Comments no apparent deficits  Bed Mobility  Overal bed mobility Independent  Transfers  General transfer comment Per PT - pt stood with increased time but no physical assist needed. No LOB but pt reaching for stable surfaces  Balance  Overall balance assessment Needs assistance  Sitting-balance support No upper extremity supported  Sitting balance-Leahy Scale Good  Standing balance support No upper extremity supported  Standing balance-Leahy Scale Good  General Comments  General comments (skin integrity, edema, etc.) Pt expressing her conern and fear of having another CVA  OT - End of Session  Activity Tolerance Patient tolerated treatment well  Patient left in bed;with call bell/phone within reach;Other (comment) (having ECHO)  Nurse Communication Mobility status  OT Assessment  OT Recommendation/Assessment Patient needs continued OT Services  OT Visit Diagnosis Unsteadiness on feet (R26.81);Muscle weakness (generalized) (M62.81)  OT Problem List Decreased strength;Decreased coordination;Impaired UE functional use  OT Plan  OT Frequency (ACUTE  ONLY) Min 2X/week  OT Treatment/Interventions (  ACUTE ONLY) Self-care/ADL training;Therapeutic exercise;Neuromuscular education;Therapeutic activities;Patient/family education  AM-PAC OT "6 Clicks" Daily Activity Outcome Measure (Version 2)  Help from another person eating meals? 4  Help from another person taking care of personal grooming? 3  Help from another person toileting, which includes using toliet, bedpan, or urinal? 3  Help from another person bathing (including washing, rinsing, drying)? 3  Help from another person to put on and taking off regular upper body clothing? 3  Help from another person to put on and taking off regular lower body clothing? 3  6 Click Score 19  OT Recommendation  Follow Up Recommendations Supervision - Intermittent;Outpatient OT (pending progress)  OT Equipment None recommended by OT  Individuals Consulted  Consulted and Agree with Results and Recommendations Patient  Acute Rehab OT Goals  Patient Stated Goal get better  OT Goal Formulation With patient  Time For Goal Achievement 08/12/19  Potential to Achieve Goals Good  OT Time Calculation  OT Start Time (ACUTE ONLY) 1507  OT Stop Time (ACUTE ONLY) 1525  OT Time Calculation (min) 18 min  OT General Charges  $OT Visit 1 Visit  OT Evaluation  $OT Eval Moderate Complexity 1 Mod  Written Expression  Dominant Hand Right  Maurie Boettcher, OT/L   Acute OT Clinical Specialist Acute Rehabilitation Services Pager [phone] Office [phone]

## 2019-07-29 NOTE — Progress Notes (Signed)
  Echocardiogram 2D Echocardiogram has been performed.  Tara [name] 07/29/2019, 4:51 PM

## 2019-07-29 NOTE — ED Provider Notes (Signed)
[facility] EMERGENCY DEPARTMENT Provider Note   CSN: [id number] Arrival date & time: 07/29/19  3762     History Chief Complaint  Patient presents with  . Code Stroke    Tara [name] is a 54 y.o. female.  HPI     This is a 54 year old female with a history of asthma, high cholesterol, migraine who presents from triage with concerns for strokelike symptoms.  Patient reports that she woke up at 10:15 PM and ambulated normally to the bathroom.  She states that she subsequently woke up around 3 AM and noted difficulty with word finding.  She also tried to drink some water and found that she had difficulty swallowing.  Additionally she noted some weakness in her left fingers.  She reports symptoms have markedly improved but she continues to have difficulty with extension of her fourth and fifth digits.  She denies any headache.  Denies neck pain.  Denies any recent fevers, cough.  Patient initially screened as a code stroke from triage.  She was cleared for CT scan.  Neurology consulted.  Past Medical History:  Diagnosis Date  . Asthma   . Hyperlipidemia   . Migraine   . Rotator cuff tear   . Spondylolisthesis    L4-5    Patient Active Problem List   Diagnosis Date Noted  . Palpitations 09/15/2015  . Diastolic dysfunction 09/15/2015  . Renal cyst, left 06/19/2013  . History of migraine headaches 03/05/2013  . 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 Hyperlipidemia 03/05/2013  . 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 Rotator cuff syndrome 03/05/2013  . Low back pain 03/05/2013    Past Surgical History:  Procedure Laterality Date  . No prior surgery       OB History    Gravida  3   Para  1   Term      Preterm  1   AB  1   Living  2     SAB  1   TAB      Ectopic      Multiple      Live Births              Family History  Problem Relation Age of Onset  . Heart disease Father 81       MI  . Hypertension Maternal Grandmother   .  Hyperlipidemia Brother     Social History   Tobacco Use  . Smoking status: Never Smoker  . Smokeless tobacco: Never Used  Substance Use Topics  . Alcohol use: No    Alcohol/week: 0.0 standard drinks    Comment: Rare  . Drug use: No    Home Medications Prior to Admission medications   Medication Sig Start Date End Date Taking? Authorizing Provider  cholecalciferol (VITAMIN D) 1000 UNITS tablet Take 1,000 Units by mouth daily.    [provider]  cyclobenzaprine (FLEXERIL) 10 MG tablet Take 1 tablet (10 mg total) by mouth 2 (two) times daily as needed for muscle spasms. 11/25/12   Piepenbrink, Victorino Dike, PA-C  Omega-3 Fatty Acids (FISH OIL) 500 MG CAPS Natures made Fish oil pearls 500mg  BID    Schoenhoff, , MD  ondansetron (ZOFRAN ODT) 4 MG disintegrating tablet Take 1 tablet (4 mg total) by mouth every 8 (eight) hours as needed for nausea or vomiting. 01/01/17   03/03/17, MD  Probiotic Product (PROBIOTIC PO) Take by mouth daily.    [provider]  Rizatriptan Benzoate (MAXALT PO)  Take by mouth as needed (headache).     [provider]    Allergies    Tape  Review of Systems   Review of Systems  Constitutional: Negative for fever.  HENT: Positive for trouble swallowing.   Respiratory: Negative for shortness of breath.   Cardiovascular: Negative for chest pain.  Gastrointestinal: Negative for abdominal pain, nausea and vomiting.  Genitourinary: Negative for dysuria.  Musculoskeletal: Negative for neck pain.  Neurological: Positive for speech difficulty and weakness. Negative for facial asymmetry and numbness.  All other systems reviewed and are negative.   Physical Exam Updated Vital Signs BP 128/89 (BP Location: Right Arm)   Pulse 95   Temp (!) 97.4 F (36.3 C) (Oral)   Resp 16   Ht 1.626 m (5\' 4" )   Wt 76.5 kg   LMP 11/24/2013   SpO2 97%   BMI 28.95 kg/m   Physical Exam Vitals and nursing note reviewed.  Constitutional:       Appearance: She is well-developed. She is not ill-appearing.     Comments: ABCs intact  HENT:     Head: Normocephalic and atraumatic.  Eyes:     Pupils: Pupils are equal, round, and reactive to light.  Cardiovascular:     Rate and Rhythm: Normal rate and regular rhythm.  Pulmonary:     Effort: Pulmonary effort is normal. No respiratory distress.  Abdominal:     Palpations: Abdomen is soft.     Tenderness: There is no abdominal tenderness.  Musculoskeletal:     Cervical back: Neck supple.     Right lower leg: No edema.     Left lower leg: No edema.  Skin:    General: Skin is warm and dry.  Neurological:     Mental Status: She is alert and oriented to person, place, and time.     Comments: Speech appears fluent, cranial nerves II through XII intact, give weakness on the left with both grip, biceps, triceps, deltoids appear preserved Neurologic exam limited secondary to acuity of condition to get patient to CT.  Psychiatric:        Mood and Affect: Mood normal.     ED Results / Procedures / Treatments   Labs (all labs ordered are listed, but only abnormal results are displayed) Labs Reviewed  PROTIME-INR - Abnormal; Notable for the following components:      Result Value   Prothrombin Time 11.2 (*)    All other components within normal limits  DIFFERENTIAL - Abnormal; Notable for the following components:   Lymphs Abs 4.2 (*)    All other components within normal limits  COMPREHENSIVE METABOLIC PANEL - Abnormal; Notable for the following components:   Glucose, Bld 134 (*)    All other components within normal limits  CBG MONITORING, ED - Abnormal; Notable for the following components:   Glucose-Capillary 130 (*)    All other components within normal limits  I-STAT CHEM 8, ED - Abnormal; Notable for the following components:   Glucose, Bld 126 (*)    All other components within normal limits  APTT  CBC  CBG MONITORING, ED  I-STAT BETA HCG BLOOD, ED ([facility], [facility], [facility]  ONLY)    EKG EKG Interpretation  Date/Time:  Tuesday July 29 2019 04:45:14 EST Ventricular Rate:  92 PR Interval:    QRS Duration: 97 QT Interval:  369 QTC Calculation: 457 R Axis:   63 Text Interpretation: Sinus rhythm Confirmed by Thayer Jew [phone]) on 07/29/2019 4:46:56 AM  Radiology CT HEAD CODE STROKE WO CONTRAST  Result Date: 07/29/2019 CLINICAL DATA:  Code stroke. Aphasia. Left-sided weakness. Difficulty swallowing. Last seen normal at 22:50 yesterday. EXAM: CT HEAD WITHOUT CONTRAST TECHNIQUE: Contiguous axial images were obtained from the base of the skull through the vertex without intravenous contrast. COMPARISON:  None. FINDINGS: Brain: No acute infarct, hemorrhage, or mass lesion is present. Basal ganglia are intact. Insular ribbon is normal. No acute or focal cortical abnormality is present. White matter is within normal limits. The ventricles are of normal size. No significant extraaxial fluid collection is present. Vascular: Focal hyperdensity is present at the right MCA bifurcation. No significant vascular calcifications are present. No other focal vascular hyperdensity is present. Skull: Calvarium is intact. No focal lytic or blastic lesions are present. No significant extracranial soft tissue lesion is present. Sinuses/Orbits: Mild mucosal thickening is present in the right greater than left ethmoid air cells. A fluid level is present in the right maxillary sinus. The paranasal sinuses and mastoid air cells are otherwise clear. The globes and orbits are within normal limits. ASPECTS [facility] Stroke Program Early CT Score) - Ganglionic level infarction (caudate, lentiform nuclei, internal capsule, insula, M1-M3 cortex): 7/7 - Supraganglionic infarction (M4-M6 cortex): 3/3 Total score (0-10 with 10 being normal): 10/10 IMPRESSION: 1. Focal hyperdensity at the right MCA bifurcation concerning for acute thrombus. Recommend CTA of the head and neck to exclude large vessel  occlusion. 2. Normal CT appearance of the brain. 3. Right maxillary sinus disease. *ASPECTS is 10/10 Electronically Signed   By: Marin Roberts M.D.   On: 07/29/2019 04:17    Procedures Procedures (including critical care time)  Medications Ordered in ED Medications  sodium chloride flush (NS) 0.9 % injection 3 mL (has no administration in time range)  aspirin EC tablet 325 mg (has no administration in time range)  iohexol (OMNIPAQUE) 350 MG/ML injection 100 mL (100 mLs Intravenous Contrast Given 07/29/19 0442)    ED Course  I have reviewed the triage vital signs and the nursing notes.  Pertinent labs & imaging results that were available during my care of the patient were reviewed by me and considered in my medical decision making (see chart for details).    MDM Rules/Calculators/A&P                       Patient presents with strokelike symptoms.  She is within the TPA window.  She was evaluated by me.  She has some give weakness in the left upper extremity but speech deficits seem to have resolved.  Neurology at the bedside.  Initial CT was concerning for dense MCA.  CTA and perfusion reviewed by neurology and no evidence of large vessel occlusion.  Recommend aspirin and MRI.  Admit for full stroke work-up.  EKG does not show any evidence of arrhythmia or atrial fibrillation.  Lab work reviewed and largely otherwise reassuring.  Final Clinical Impression(s) / ED Diagnoses Final diagnoses:  Stroke-like symptom    Rx / DC Orders ED Discharge Orders    None       Lucus Lambertson, Mayer Masker, MD 07/29/19 [phone]

## 2019-07-29 NOTE — Procedures (Signed)
Patient Name: Tara [name]  MRN: [id number]  Epilepsy Attending: Charlsie Quest  Referring Physician/Provider: Annie Main, NP Date: 07/29/2019 Duration: 23.46 minutes  Patient history: 68 old female with left-sided weakness and dysphagia.  EEG evaluate for seizures.  Level of alertness: Awake  AEDs during EEG study: None  Technical aspects: This EEG study was done with scalp electrodes positioned according to the 10-20 International system of electrode placement. Electrical activity was acquired at a sampling rate of 500Hz  and reviewed with a high frequency filter of 70Hz  and a low frequency filter of 1Hz . EEG data were recorded continuously and digitally stored.   Description: The posterior dominant rhythm consists of 10-11 Hz activity of moderate voltage (25-35 uV) seen predominantly in posterior head regions, symmetric and reactive to eye opening and eye closing.  Physiologic photic driving was seen during photic stimulation.  Hyperventilation was not performed.         IMPRESSION: This study is within normal limits. No seizures or epileptiform discharges were seen throughout the recording.  Nicholi Ghuman 

## 2019-07-29 NOTE — Evaluation (Signed)
Clinical/Bedside Swallow Evaluation Patient Details  Name: Tara [name] MRN: [id number] Date of Birth: 09-22-1965  Today's Date: 07/29/2019 Time: SLP Start Time (ACUTE ONLY): 1106 SLP Stop Time (ACUTE ONLY): 1128 SLP Time Calculation (min) (ACUTE ONLY): 22 min  Past Medical History:  Past Medical History:  Diagnosis Date  . Asthma   . Hyperlipidemia   . Migraine   . Rotator cuff tear   . Spondylolisthesis    L4-5   Past Surgical History:  Past Surgical History:  Procedure Laterality Date  . No prior surgery     HPI:  This is a 54 year old female with a history of asthma, high cholesterol, migraine who presents with difficulty with word finding, difficulty swallowing and weakness in her left fingers. 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 CXR no acute abnormalities.   Assessment / Plan / Recommendation Clinical Impression  Pt is able to retract, lateralize and protrude her tongue functionally. She does report sensory differences, tightness along her left jaw and describes imprecise speech and lingual proprioception. Volitional cough is strong. During 3 oz water challenge pt hesitated between intervals. Frequent throat clears during assessment with puree, puree with pill (with RN), cracker and thin (cup and straw). Chin tuck appeared to significantly decrease throat clearing and pt felt it was easier to swallow. Pt denies history of GER. SLP suspects she may have experienced brief incident of airway protrusion and has hypersensitve sensation resulting in the throat clearing. MBS recommended (3/3) and initiate regular texture, thin with cup and chin tuck and pills with pudding.   SLP Visit Diagnosis: Dysphagia, unspecified (R13.10)    Aspiration Risk  Mild aspiration risk    Diet  Recommendation Regular;Thin liquid   Liquid Administration via: Cup;No straw Medication Administration: Whole meds with puree Supervision: Patient able to self feed;Intermittent supervision to cue for compensatory strategies Compensations: Chin tuck;Other (Comment);Slow rate;Small sips/bites(tuck with liquids) Postural Changes: Seated upright at 90 degrees    Other  Recommendations Oral Care Recommendations: Oral care BID   Follow up Recommendations None      Frequency and Duration min 2x/week  1 week       Prognosis Prognosis for Safe Diet Advancement: Good      Swallow Study   General HPI: This is a 54 year old female with a history of asthma, high cholesterol, migraine who presents with difficulty with word finding, difficulty swallowing and weakness in her left fingers. 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 CXR no acute abnormalities. Type of Study: Bedside Swallow Evaluation Previous Swallow Assessment: (none) Diet Prior to this Study: NPO Temperature Spikes Noted: No Respiratory Status: Room air History of Recent Intubation: No Behavior/Cognition: Alert;Cooperative;Pleasant mood Oral Cavity Assessment: Within Functional Limits Oral Care Completed by SLP: No Oral Cavity - Dentition: Adequate natural dentition Vision: Functional for self-feeding Self-Feeding Abilities: Able to feed self Patient Positioning: Upright in bed Baseline Vocal Quality: Normal Volitional Cough: Strong Volitional Swallow: Able to elicit    Oral/Motor/Sensory Function Overall Oral Motor/Sensory Function: (ROM normal, sensation differences on left -tightness)   Ice Chips Ice chips: Not tested   Thin Liquid Thin Liquid: Impaired Presentation: Cup;Straw Oral Phase Impairments: (WFL) Oral Phase  Functional Implications: [facility]) Pharyngeal  Phase Impairments: Throat Clearing - Delayed;Throat Clearing - Immediate    Nectar Thick Nectar Thick Liquid: Not tested   Honey Thick Honey Thick Liquid: Not tested   Puree Puree: Impaired Pharyngeal Phase Impairments: Throat Clearing - Immediate;Throat Clearing - Delayed   Solid     Solid: Impaired Presentation: Self Fed Pharyngeal Phase Impairments: Throat Clearing - Delayed      [name] 07/29/2019,12:25 PM  [name].Ed Risk analyst [phone] Office [phone]

## 2019-07-29 NOTE — ED Notes (Signed)
Husband ( Jojo Dobkins ) would like to be updated on patient's condition . Tel. [phone].

## 2019-07-29 NOTE — ED Notes (Signed)
purewick is in place and hooked to suction canister.  

## 2019-07-29 NOTE — Progress Notes (Signed)
Bilateral lower extremity venous duplex has been completed. Preliminary results can be found in CV Proc through chart review.   07/29/19 11:02 AM Olen Cordial RVT

## 2019-07-29 NOTE — H&P (Addendum)
History and Physical    Tara [name] XLK:[id number] DOB: 09/07/1965 DOA: 07/29/2019  Referring MD/NP/PA: Midge MiniumArshad Kakrakandy PCP: Kendrick RanchSchoenhoff, Deborah D, MD  Patient coming from: home  Chief Complaint: Left hand numbness and weakness  I have personally briefly reviewed patient's old medical records in [facility]   HPI: Tara [name] is a 54 y.o. female with medical history significant of hyperlipidemia, asthma, and migraine headaches.  She presents with complaints of left hand numbness and weakness.  Patient reports being in her normal state of health before going to bed around 10:50 PM.  When she woke up at around 3:15 in the morning noted numbness and weakness in her left hand when trying to use the commode.  Her husband noted that she had been calling out to him, but he was unable to understand what she was saying.  She then tried to drink something, but was unable to she got choked and water was running on the side of her mouth.  Denies having any headache, changes in vision, nausea, vomiting, shortness of breath, recent sick contacts, or diarrhea symptoms.  She has noted over the last 3 months or so that her heart rate ranged from 80-114 and she is experience some intermittent palpitations.  Since being here in the hospital get up and walk around she has felt woozy, but questions if this is due to the lack of sleep.  ED Course: Upon admission into the emergency department patient was seen as a code stroke.  Patient was afebrile with mild tachypnea and tachycardia.  CT scan of the brain was concerning for possible thrombus of the right MCA bifurcation.  Subsequent CTA of the head neck however did not reveal any evidence of large vessel occlusion.  tPA was not recommended.  Labs were otherwise relatively unremarkable.  Patient was able to pass swallow study, but continued to clear her throat.  It was recommended to keep patient n.p.o.  TRH called to admit for completion of stroke work-up  with PT/OT/speech evaluations.    Review of Systems  Constitutional: Negative for fever and malaise/fatigue.  HENT: Negative for congestion and nosebleeds.   Eyes: Negative for double vision and photophobia.  Respiratory: Negative for cough and shortness of breath.   Cardiovascular: Positive for palpitations. Negative for chest pain and PND.  Gastrointestinal: Negative for abdominal pain, diarrhea, nausea and vomiting.  Genitourinary: Negative for dysuria and hematuria.  Musculoskeletal: Negative for joint pain and myalgias.  Skin: Negative for itching and rash.  Neurological: Positive for dizziness, sensory change, speech change and focal weakness.  Psychiatric/Behavioral: Negative for substance abuse and suicidal ideas.    Past Medical History:  Diagnosis Date  . Asthma   . Hyperlipidemia   . Migraine   . Rotator cuff tear   . Spondylolisthesis    L4-5    Past Surgical History:  Procedure Laterality Date  . No prior surgery       reports that she has never smoked. She has never used smokeless tobacco. She reports that she does not drink alcohol or use drugs.  Allergies  Allergen Reactions  . Tape Rash    Family History  Problem Relation Age of Onset  . Heart disease Father [id number]       MI  . Hypertension Maternal Grandmother   . Hyperlipidemia Brother     Prior to Admission medications   Medication Sig Start Date End Date Taking? Authorizing Provider  cholecalciferol (VITAMIN D) 1000 UNITS tablet Take 1,000 Units  by mouth daily.   Yes [provider]  Omega-3 Fatty Acids (FISH OIL) 500 MG CAPS Natures made Fish oil pearls 500mg  BID Patient taking differently: Take 1 capsule by mouth daily. Natures made Fish oil pearls 500mg  BID   Yes Schoenhoff, , MD  Probiotic Product (PROBIOTIC PO) Take 1 capsule by mouth daily.    Yes [provider]  rizatriptan (MAXALT) 10 MG tablet Take 10 mg by mouth as needed for migraine (headache).    Yes  [provider]  rosuvastatin (CRESTOR) 10 MG tablet Take 10 mg by mouth every Monday, Wednesday, and Friday.  06/03/19  Yes [provider]    Physical Exam:  Constitutional: Middle-aged female in NAD, calm, comfortable Vitals:   07/29/19 0447 07/29/19 0515 07/29/19 0530 07/29/19 0730  BP: 128/89 121/90 125/89 (!) 128/93  Pulse:  84 89 77  Resp: 16 20 20 20   Temp: (!) 97.4 F (36.3 C)     TempSrc: Oral     SpO2: 97% 97% 98% 97%  Weight: 76.5 kg     Height:       Eyes: PERRL, lids and conjunctivae normal ENMT: Mucous membranes are moist. Posterior pharynx clear of any exudate or lesions.Normal dentition.  Neck: normal, supple, no masses, no thyromegaly Respiratory: clear to auscultation bilaterally, no wheezing, no crackles. Normal respiratory effort. No accessory muscle use.  Cardiovascular: Regular rate and rhythm, no murmurs / rubs / gallops. No extremity edema. 2+ pedal pulses. No carotid bruits.  Abdomen: no tenderness, no masses palpated. No hepatosplenomegaly. Bowel sounds positive.  Musculoskeletal: no clubbing / cyanosis. No joint deformity upper and lower extremities. Good ROM, no contractures. Normal muscle tone.  Skin: no rashes, lesions, ulcers. No induration Neurologic: CN 2-12 grossly intact. Sensation intact, DTR normal. Strength 5/5 on the right upper and lower extremity on the left and right, but the left upper extremity strength 4/5. Psychiatric: Normal judgment and insight. Alert and oriented x 3. Normal mood.     Labs on Admission: I have personally reviewed following labs and imaging studies  CBC: Recent Labs  Lab 07/29/19 0402 07/29/19 0406  WBC  --  8.4  NEUTROABS  --  3.0  HGB 13.9 13.3  HCT 41.0 41.5  MCV  --  95.8  PLT  --  369   Basic Metabolic Panel: Recent Labs  Lab 07/29/19 0402 07/29/19 0406  NA 141 138  K 3.7 3.7  CL 103 103  CO2  --  26  GLUCOSE 126* 134*  BUN 16 15  CREATININE 0.90 0.88  CALCIUM  --  9.7    GFR: Estimated Creatinine Clearance: 74.8 mL/min (by C-G formula based on SCr of 0.88 mg/dL). Liver Function Tests: Recent Labs  Lab 07/29/19 0406  AST 21  ALT 27  ALKPHOS 52  BILITOT 0.4  PROT 6.9  ALBUMIN 4.0   No results for input(s): LIPASE, AMYLASE in the last 168 hours. No results for input(s): AMMONIA in the last 168 hours. Coagulation Profile: Recent Labs  Lab 07/29/19 0406  INR 0.8   Cardiac Enzymes: No results for input(s): CKTOTAL, CKMB, CKMBINDEX, TROPONINI in the last 168 hours. BNP (last 3 results) No results for input(s): PROBNP in the last 8760 hours. HbA1C: No results for input(s): HGBA1C in the last 72 hours. CBG: Recent Labs  Lab 07/29/19 0357  GLUCAP 130*   Lipid Profile: No results for input(s): CHOL, HDL, LDLCALC, TRIG, CHOLHDL, LDLDIRECT in the last 72 hours. Thyroid Function Tests:  No results for input(s): TSH, T4TOTAL, FREET4, T3FREE, THYROIDAB in the last 72 hours. Anemia Panel: No results for input(s): VITAMINB12, FOLATE, FERRITIN, TIBC, IRON, RETICCTPCT in the last 72 hours. Urine analysis:    Component Value Date/Time   BILIRUBINUR neg 06/22/2014 1118   PROTEINUR neg 06/22/2014 1118   UROBILINOGEN negative 06/22/2014 1118   NITRITE neg 06/22/2014 1118   LEUKOCYTESUR Negative 06/22/2014 1118   Sepsis Labs: No results found for this or any previous visit (from the past 240 hour(s)).   Radiological Exams on Admission: CT Code Stroke CTA Head W/WO contrast  Result Date: 07/29/2019 CLINICAL DATA:  Left-sided weakness. Aphasia. Symptoms are improving. Abnormal CT of the head. EXAM: CT ANGIOGRAPHY HEAD AND NECK CT PERFUSION BRAIN TECHNIQUE: Multidetector CT imaging of the head and neck was performed using the standard protocol during bolus administration of intravenous contrast. Multiplanar CT image reconstructions and MIPs were obtained to evaluate the vascular anatomy. Carotid stenosis measurements (when applicable) are obtained  utilizing NASCET criteria, using the distal internal carotid diameter as the denominator. Multiphase CT imaging of the brain was performed following IV bolus contrast injection. Subsequent parametric perfusion maps were calculated using RAPID software. CONTRAST:  OMNIPAQUE IOHEXOL 350 MG/ML SOLN COMPARISON:  None. FINDINGS: CTA NECK FINDINGS Aortic arch: A 3 vessel arch configuration is present. No significant atherosclerotic changes present. No aneurysm or stenosis is present. Right carotid system: The right common carotid artery is within normal limits. The bifurcation is unremarkable. Cervical right ICA is normal. Left carotid system: Mild tortuosity is present in the proximal left common carotid artery without significant stenosis. The bifurcation is within normal limits. Cervical left ICA is normal. Vertebral arteries: Both vertebral arteries originate from the subclavian arteries. The left vertebral artery is slightly larger. No significant stenosis is present in either vertebral artery in the neck. Skeleton: Some straightening of the normal cervical lordosis is noted. No significant listhesis is present. Vertebral body heights and alignment are normal. Other neck: The soft tissues of the neck are otherwise normal. Upper chest: Mild ground-glass attenuation is present at the lung apices bilaterally, likely reflecting atelectasis or edema. No significant airspace consolidation is present. Thoracic inlet is normal. Review of the MIP images confirms the above findings CTA HEAD FINDINGS Anterior circulation: Minimal atherosclerotic changes are present within the cavernous internal carotid arteries bilaterally. No significant stenosis is present through the ICA termini. Mild irregularity is present in the left A1 segment without significant stenosis. MCA bifurcations are intact. There is some irregularity of distal ACA and MCA branch vessels without a significant proximal stenosis or occlusion. Posterior  circulation: The left vertebral artery is dominant. PICA origins are visualized and normal. Vertebrobasilar junction is normal. The basilar artery is normal. Both posterior cerebral arteries originate from the basilar tip. Irregularity of distal PCA branch vessels is present without a significant proximal stenosis or occlusion. Venous sinuses: The dural sinuses are patent. The straight sinus deep cerebral veins are intact. Cortical veins are unremarkable. No vascular malformation present. Anatomic variants: None Review of the MIP images confirms the above findings CT Brain Perfusion Findings: ASPECTS: 10/10 CBF (<30%) Volume: 32mL Perfusion (Tmax>6.0s) volume: 25mL Mismatch Volume: 80mL Infarction Location:n/a IMPRESSION: 1. No emergent large vessel occlusion. 2. Normal CT perfusion of the head. 3. Mild distal small vessel irregularity in both the anterior and posterior circulation suggesting intracranial atherosclerotic change without a proximal stenosis, aneurysm or branch vessel occlusion. 4. No significant stenosis in the neck. The above was relayed via text  pager to Dr. Ritta Slot on 07/29/2019 at 04:53 . Electronically Signed   By: Marin Roberts M.D.   On: 07/29/2019 05:00   CT Code Stroke CTA Neck W/WO contrast  Result Date: 07/29/2019 CLINICAL DATA:  Left-sided weakness. Aphasia. Symptoms are improving. Abnormal CT of the head. EXAM: CT ANGIOGRAPHY HEAD AND NECK CT PERFUSION BRAIN TECHNIQUE: Multidetector CT imaging of the head and neck was performed using the standard protocol during bolus administration of intravenous contrast. Multiplanar CT image reconstructions and MIPs were obtained to evaluate the vascular anatomy. Carotid stenosis measurements (when applicable) are obtained utilizing NASCET criteria, using the distal internal carotid diameter as the denominator. Multiphase CT imaging of the brain was performed following IV bolus contrast injection. Subsequent parametric perfusion maps  were calculated using RAPID software. CONTRAST:  OMNIPAQUE IOHEXOL 350 MG/ML SOLN COMPARISON:  None. FINDINGS: CTA NECK FINDINGS Aortic arch: A 3 vessel arch configuration is present. No significant atherosclerotic changes present. No aneurysm or stenosis is present. Right carotid system: The right common carotid artery is within normal limits. The bifurcation is unremarkable. Cervical right ICA is normal. Left carotid system: Mild tortuosity is present in the proximal left common carotid artery without significant stenosis. The bifurcation is within normal limits. Cervical left ICA is normal. Vertebral arteries: Both vertebral arteries originate from the subclavian arteries. The left vertebral artery is slightly larger. No significant stenosis is present in either vertebral artery in the neck. Skeleton: Some straightening of the normal cervical lordosis is noted. No significant listhesis is present. Vertebral body heights and alignment are normal. Other neck: The soft tissues of the neck are otherwise normal. Upper chest: Mild ground-glass attenuation is present at the lung apices bilaterally, likely reflecting atelectasis or edema. No significant airspace consolidation is present. Thoracic inlet is normal. Review of the MIP images confirms the above findings CTA HEAD FINDINGS Anterior circulation: Minimal atherosclerotic changes are present within the cavernous internal carotid arteries bilaterally. No significant stenosis is present through the ICA termini. Mild irregularity is present in the left A1 segment without significant stenosis. MCA bifurcations are intact. There is some irregularity of distal ACA and MCA branch vessels without a significant proximal stenosis or occlusion. Posterior circulation: The left vertebral artery is dominant. PICA origins are visualized and normal. Vertebrobasilar junction is normal. The basilar artery is normal. Both posterior cerebral arteries originate from the basilar  tip. Irregularity of distal PCA branch vessels is present without a significant proximal stenosis or occlusion. Venous sinuses: The dural sinuses are patent. The straight sinus deep cerebral veins are intact. Cortical veins are unremarkable. No vascular malformation present. Anatomic variants: None Review of the MIP images confirms the above findings CT Brain Perfusion Findings: ASPECTS: 10/10 CBF (<30%) Volume: 31mL Perfusion (Tmax>6.0s) volume: 57mL Mismatch Volume: 37mL Infarction Location:n/a IMPRESSION: 1. No emergent large vessel occlusion. 2. Normal CT perfusion of the head. 3. Mild distal small vessel irregularity in both the anterior and posterior circulation suggesting intracranial atherosclerotic change without a proximal stenosis, aneurysm or branch vessel occlusion. 4. No significant stenosis in the neck. The above was relayed via text pager to Dr. Ritta Slot on 07/29/2019 at 04:53 . Electronically Signed   By: Marin Roberts M.D.   On: 07/29/2019 05:00   CT Code Stroke Cerebral Perfusion with contrast  Result Date: 07/29/2019 CLINICAL DATA:  Left-sided weakness. Aphasia. Symptoms are improving. Abnormal CT of the head. EXAM: CT ANGIOGRAPHY HEAD AND NECK CT PERFUSION BRAIN TECHNIQUE: Multidetector CT imaging of the head  and neck was performed using the standard protocol during bolus administration of intravenous contrast. Multiplanar CT image reconstructions and MIPs were obtained to evaluate the vascular anatomy. Carotid stenosis measurements (when applicable) are obtained utilizing NASCET criteria, using the distal internal carotid diameter as the denominator. Multiphase CT imaging of the brain was performed following IV bolus contrast injection. Subsequent parametric perfusion maps were calculated using RAPID software. CONTRAST:  OMNIPAQUE IOHEXOL 350 MG/ML SOLN COMPARISON:  None. FINDINGS: CTA NECK FINDINGS Aortic arch: A 3 vessel arch configuration is present. No significant  atherosclerotic changes present. No aneurysm or stenosis is present. Right carotid system: The right common carotid artery is within normal limits. The bifurcation is unremarkable. Cervical right ICA is normal. Left carotid system: Mild tortuosity is present in the proximal left common carotid artery without significant stenosis. The bifurcation is within normal limits. Cervical left ICA is normal. Vertebral arteries: Both vertebral arteries originate from the subclavian arteries. The left vertebral artery is slightly larger. No significant stenosis is present in either vertebral artery in the neck. Skeleton: Some straightening of the normal cervical lordosis is noted. No significant listhesis is present. Vertebral body heights and alignment are normal. Other neck: The soft tissues of the neck are otherwise normal. Upper chest: Mild ground-glass attenuation is present at the lung apices bilaterally, likely reflecting atelectasis or edema. No significant airspace consolidation is present. Thoracic inlet is normal. Review of the MIP images confirms the above findings CTA HEAD FINDINGS Anterior circulation: Minimal atherosclerotic changes are present within the cavernous internal carotid arteries bilaterally. No significant stenosis is present through the ICA termini. Mild irregularity is present in the left A1 segment without significant stenosis. MCA bifurcations are intact. There is some irregularity of distal ACA and MCA branch vessels without a significant proximal stenosis or occlusion. Posterior circulation: The left vertebral artery is dominant. PICA origins are visualized and normal. Vertebrobasilar junction is normal. The basilar artery is normal. Both posterior cerebral arteries originate from the basilar tip. Irregularity of distal PCA branch vessels is present without a significant proximal stenosis or occlusion. Venous sinuses: The dural sinuses are patent. The straight sinus deep cerebral veins are  intact. Cortical veins are unremarkable. No vascular malformation present. Anatomic variants: None Review of the MIP images confirms the above findings CT Brain Perfusion Findings: ASPECTS: 10/10 CBF (<30%) Volume: 34mL Perfusion (Tmax>6.0s) volume: 84mL Mismatch Volume: 55mL Infarction Location:n/a IMPRESSION: 1. No emergent large vessel occlusion. 2. Normal CT perfusion of the head. 3. Mild distal small vessel irregularity in both the anterior and posterior circulation suggesting intracranial atherosclerotic change without a proximal stenosis, aneurysm or branch vessel occlusion. 4. No significant stenosis in the neck. The above was relayed via text pager to Dr. Ritta Slot on 07/29/2019 at 04:53 . Electronically Signed   By: Marin Roberts M.D.   On: 07/29/2019 05:00   CT HEAD CODE STROKE WO CONTRAST  Result Date: 07/29/2019 CLINICAL DATA:  Code stroke. Aphasia. Left-sided weakness. Difficulty swallowing. Last seen normal at 22:50 yesterday. EXAM: CT HEAD WITHOUT CONTRAST TECHNIQUE: Contiguous axial images were obtained from the base of the skull through the vertex without intravenous contrast. COMPARISON:  None. FINDINGS: Brain: No acute infarct, hemorrhage, or mass lesion is present. Basal ganglia are intact. Insular ribbon is normal. No acute or focal cortical abnormality is present. White matter is within normal limits. The ventricles are of normal size. No significant extraaxial fluid collection is present. Vascular: Focal hyperdensity is present at the right MCA bifurcation.  No significant vascular calcifications are present. No other focal vascular hyperdensity is present. Skull: Calvarium is intact. No focal lytic or blastic lesions are present. No significant extracranial soft tissue lesion is present. Sinuses/Orbits: Mild mucosal thickening is present in the right greater than left ethmoid air cells. A fluid level is present in the right maxillary sinus. The paranasal sinuses and mastoid air  cells are otherwise clear. The globes and orbits are within normal limits. ASPECTS [facility] Stroke Program Early CT Score) - Ganglionic level infarction (caudate, lentiform nuclei, internal capsule, insula, M1-M3 cortex): 7/7 - Supraganglionic infarction (M4-M6 cortex): 3/3 Total score (0-10 with 10 being normal): 10/10 IMPRESSION: 1. Focal hyperdensity at the right MCA bifurcation concerning for acute thrombus. Recommend CTA of the head and neck to exclude large vessel occlusion. 2. Normal CT appearance of the brain. 3. Right maxillary sinus disease. *ASPECTS is 10/10 Electronically Signed   By: San Morelle M.D.   On: 07/29/2019 04:17    EKG: Independently reviewed.  Sinus rhythm at 92 bpm.  Assessment/Plan Left hand numbness/weakness secondary to CVA: Acute.  Patient presented with complaints of left upper extremity numbness and weakness with dysphagia.  Patient's initial CT scan was concerning for possible right MCA bifurcation thrombus, but CTA of the neck did not note any large vessel occlusion.  MRI did note 2 small infarcts of the right cortical hemisphere.  LDL was elevated at 151 and hemoglobin A1c 5.3. -Admit to telemetry bed -Stroke order set initiated -Neuro checks -Check echocardiogram -Follow-up EEG -PT/OT/Speech to eval and treat -Dual antiplatelet therapy and statin -Appreciate neurology consultative services, will follow-up    Dysphagia: Patient noted to repeatedly keep trying to clear her throat in the ED for which there was concern.  Neurology recommended formal speech therapy evaluation.  Likely secondary to patient's stroke as noted above. -Appreciate speech therapy, will further recommendation  Hyperlipidemia: Home medications include Rouvastatin 10 mg Monday, Wednesday, and Friday. -Goal LDL less than 70  Palpitations: Patient reports intermittent feeling her heart beating fast. -Check TSH in a.m. -Follow-up telemetry overnight -Patient may benefit from being  placed on a Holter monitor discharge  Migraine headaches: Home medications includerizatriptan 10 mg as needed migraine headaches. -Continue pharmacy substitution of sumatriptan as needed  Diastolic dysfunction: Last echocardiogram from 2017 noted EF of 60-65% with grade 2 diastolic dysfunction.  Patient appears to be euvolemic at this time. -Follow-up repeat echocardiogram  DVT prophylaxis: lovenox Code Status: Full Family Communication: Discussed plan of care with the patient husband present at bedside Disposition Plan: Possible discharge home in  2 days Consults called: Neurology Admission status: Inpatient based off findings of patient having acute stroke.  Norval Morton MD [facility] Pager [phone]   If 7PM-7AM, please contact night-coverage [email or web address] Password [email or web address]  07/29/2019, 8:10 AM

## 2019-07-29 NOTE — Progress Notes (Signed)
    [facility] has been requested to perform a transesophageal echocardiogram on Tara [name] for stroke.  After careful review of history and examination, the risks and benefits of transesophageal echocardiogram have been explained including risks of esophageal damage, perforation (1:10,000 risk), bleeding, pharyngeal hematoma as well as other potential complications associated with conscious sedation including aspiration, arrhythmia, respiratory failure and death. Alternatives to treatment were discussed, questions were answered. Patient is willing to proceed.  TEE - Dr. Bjorn Pippin 07/30/19 @ 1200 . NPO after midnight. Meds with sips.   Manson Passey, PA-C 07/29/2019 3:13 PM

## 2019-07-29 NOTE — ED Notes (Signed)
While pt did pass the swallow screen without stopping or coughing she appears to be having difficulties swallowing.  She continues to try to clear her throat.  She states she is not having trouble swallowing as she did when she first woke up, this RN is concerned.  RN informed Neuro provider who agreed that pt should be NPO and have a swallow evaluation completed.

## 2019-07-30 ENCOUNTER — Inpatient Hospital Stay (HOSPITAL_COMMUNITY): Payer: 59 | Admitting: Certified Registered Nurse Anesthetist

## 2019-07-30 ENCOUNTER — Inpatient Hospital Stay (HOSPITAL_COMMUNITY): Payer: 59

## 2019-07-30 ENCOUNTER — Encounter (HOSPITAL_COMMUNITY): Payer: Self-pay | Admitting: Internal Medicine

## 2019-07-30 ENCOUNTER — Encounter (HOSPITAL_COMMUNITY)
Admission: EM | Disposition: A | Discharge status: Home / Self Care | Payer: Self-pay | Source: Home / Self Care | Attending: Internal Medicine

## 2019-07-30 DIAGNOSIS — I6389 Other cerebral infarction: Secondary | ICD-10-CM

## 2019-07-30 DIAGNOSIS — R002 Palpitations: Secondary | ICD-10-CM

## 2019-07-30 DIAGNOSIS — I639 Cerebral infarction, unspecified: Secondary | ICD-10-CM

## 2019-07-30 DIAGNOSIS — I5189 Other ill-defined heart diseases: Secondary | ICD-10-CM

## 2019-07-30 DIAGNOSIS — Z8669 Personal history of other diseases of the nervous system and sense organs: Secondary | ICD-10-CM

## 2019-07-30 HISTORY — PX: TEE WITHOUT CARDIOVERSION: SHX5443

## 2019-07-30 HISTORY — PX: BUBBLE STUDY: SHX6837

## 2019-07-30 HISTORY — PX: LOOP RECORDER INSERTION: EP1214

## 2019-07-30 LAB — CBC WITH DIFFERENTIAL/PLATELET
Abs Immature Granulocytes: 0.01 10*3/uL (ref 0.00–0.07)
Basophils Absolute: 0 10*3/uL (ref 0.0–0.1)
Basophils Relative: 1 %
Eosinophils Absolute: 0.4 10*3/uL (ref 0.0–0.5)
Eosinophils Relative: 8 %
HCT: 37.4 % (ref 36.0–46.0)
Hemoglobin: 12.5 g/dL (ref 12.0–15.0)
Immature Granulocytes: 0 %
Lymphocytes Relative: 38 %
Lymphs Abs: 1.9 10*3/uL (ref 0.7–4.0)
MCH: 31.3 pg (ref 26.0–34.0)
MCHC: 33.4 g/dL (ref 30.0–36.0)
MCV: 93.7 fL (ref 80.0–100.0)
Monocytes Absolute: 0.3 10*3/uL (ref 0.1–1.0)
Monocytes Relative: 7 %
Neutro Abs: 2.2 10*3/uL (ref 1.7–7.7)
Neutrophils Relative %: 46 %
Platelets: 353 10*3/uL (ref 150–400)
RBC: 3.99 MIL/uL (ref 3.87–5.11)
RDW: 11.8 % (ref 11.5–15.5)
WBC: 4.9 10*3/uL (ref 4.0–10.5)
nRBC: 0 % (ref 0.0–0.2)

## 2019-07-30 LAB — SEDIMENTATION RATE: Sed Rate: 11 mm/hr (ref 0–22)

## 2019-07-30 LAB — BASIC METABOLIC PANEL
Anion gap: 10 (ref 5–15)
BUN: 14 mg/dL (ref 6–20)
CO2: 25 mmol/L (ref 22–32)
Calcium: 9.6 mg/dL (ref 8.9–10.3)
Chloride: 105 mmol/L (ref 98–111)
Creatinine, Ser: 0.84 mg/dL (ref 0.44–1.00)
GFR calc Af Amer: >60 mL/min (ref >60)
GFR calc non Af Amer: >60 mL/min (ref >60)
Glucose, Bld: 114 mg/dL — ABNORMAL HIGH (ref 70–99)
Potassium: 4 mmol/L (ref 3.5–5.1)
Sodium: 140 mmol/L (ref 135–145)

## 2019-07-30 LAB — TSH: TSH: 1.831 u[IU]/mL (ref 0.350–4.500)

## 2019-07-30 SURGERY — ECHOCARDIOGRAM, TRANSESOPHAGEAL
Anesthesia: General

## 2019-07-30 SURGERY — LOOP RECORDER INSERTION

## 2019-07-30 MED ORDER — LIDOCAINE-EPINEPHRINE 1 %-1:100000 IJ SOLN
INTRAMUSCULAR | Status: DC | PRN
  Administered 2019-07-30: 30 mL

## 2019-07-30 MED ORDER — ONDANSETRON HCL 4 MG/2ML IJ SOLN: 4.0000 mg | Freq: Four times a day (QID) | INTRAMUSCULAR | Status: DC | PRN

## 2019-07-30 MED ORDER — PROPOFOL 10 MG/ML IV BOLUS
INTRAVENOUS | Status: DC | PRN
  Administered 2019-07-30: 30 mg via INTRAVENOUS
  Administered 2019-07-30 (×2): 25 mg via INTRAVENOUS
  Administered 2019-07-30: 30 mg via INTRAVENOUS

## 2019-07-30 MED ORDER — LACTATED RINGERS IV SOLN: INTRAVENOUS | Status: DC | PRN

## 2019-07-30 MED ORDER — ASPIRIN 81 MG PO TBEC: 81.0000 mg | DELAYED_RELEASE_TABLET | Freq: Every day | ORAL | Status: AC

## 2019-07-30 MED ORDER — ACETAMINOPHEN 325 MG PO TABS: 325.0000 mg | ORAL_TABLET | ORAL | Status: DC | PRN

## 2019-07-30 MED ORDER — SODIUM CHLORIDE 0.9 % IV SOLN: INTRAVENOUS | Status: DC

## 2019-07-30 MED ORDER — PROPOFOL 500 MG/50ML IV EMUL
INTRAVENOUS | Status: DC | PRN
  Administered 2019-07-30: 100 ug/kg/min via INTRAVENOUS

## 2019-07-30 MED ORDER — ROSUVASTATIN CALCIUM 40 MG PO TABS: 40.0000 mg | ORAL_TABLET | Freq: Every day | ORAL | 0 refills | Status: DC

## 2019-07-30 MED ORDER — LIDOCAINE-EPINEPHRINE 1 %-1:100000 IJ SOLN
INTRAMUSCULAR | Status: AC
  Filled 2019-07-30: qty 1

## 2019-07-30 MED ORDER — CLOPIDOGREL BISULFATE 75 MG PO TABS: 75.0000 mg | ORAL_TABLET | Freq: Every day | ORAL | 0 refills | Status: DC

## 2019-07-30 MED ORDER — LIDOCAINE 2% (20 MG/ML) 5 ML SYRINGE
INTRAMUSCULAR | Status: DC | PRN
  Administered 2019-07-30: 50 mg via INTRAVENOUS

## 2019-07-30 MED FILL — ROSUVASTATIN CALCIUM 40 MG: 40 | 30 days supply | Qty: 30 | Fill #0

## 2019-07-30 MED FILL — CLOPIDOGREL 75 MG TABLET: 75 | 19 days supply | Qty: 19 | Fill #0

## 2019-07-30 SURGICAL SUPPLY — 2 items
MONITOR REVEAL LINQ II (Prosthesis & Implant Heart) ×1 IMPLANT
PACK LOOP INSERTION (CUSTOM PROCEDURE TRAY) ×2 IMPLANT

## 2019-07-30 NOTE — Progress Notes (Signed)
STROKE TEAM PROGRESS NOTE   INTERVAL HISTORY Examined in endoscopy recovery room.  TEE showed a PFO but characteristics not mentioned in the report.  Patient states she has no complaints she is doing well.  She will have a loop recorder placed later today.  ESR is 11 mm.  TSH is normal.  Anticardiolipin antibodies and lupus anticoagulants are pending.  LDL is elevated at 151 mg percent and hemoglobin A1c is normal at 5.3 HIV is nonreactive Vitals:   07/30/19 1235 07/30/19 1245 07/30/19 1255 07/30/19 1305  BP: 104/62 100/72 105/78 113/68  Pulse: 98 77 75 78  Resp: 17 18 20  (!) 21  Temp: 97.7 F (36.5 C)     TempSrc: Temporal     SpO2: 96% 98% 99% 98%  Weight:      Height:        CBC:  Recent Labs  Lab 07/29/19 0406 07/30/19 0639  WBC 8.4 4.9  NEUTROABS 3.0 2.2  HGB 13.3 12.5  HCT 41.5 37.4  MCV 95.8 93.7  PLT 369 062    Basic Metabolic Panel:  Recent Labs  Lab 07/29/19 0406 07/30/19 0639  NA 138 140  K 3.7 4.0  CL 103 105  CO2 26 25  GLUCOSE 134* 114*  BUN 15 14  CREATININE 0.88 0.84  CALCIUM 9.7 9.6   Lipid Panel:     Component Value Date/Time   CHOL 241 (H) 07/29/2019 1132   TRIG 193 (H) 07/29/2019 1132   HDL 51 07/29/2019 1132   CHOLHDL 4.7 07/29/2019 1132   VLDL 39 07/29/2019 1132   LDLCALC 151 (H) 07/29/2019 1132   HgbA1c:  Lab Results  Component Value Date   HGBA1C 5.3 07/29/2019   Urine Drug Screen: No results found for: LABOPIA, COCAINSCRNUR, LABBENZ, AMPHETMU, THCU, LABBARB  Alcohol Level No results found for: ETH  IMAGING past 24 hours EP PPM/ICD IMPLANT  Result Date: 07/30/2019 CONCLUSIONS:  1. Successful implantation of a [employer] Reveal LINQ implantable loop recorder for cryptogenic stroke  2. No early apparent complications. Cristopher Peru, MD 07/30/2019 1:11 PM   EEG adult  Result Date: 07/29/2019 Lora Havens, MD     07/29/2019  2:18 PM Patient Name: Tara [name] MRN: [id number] Epilepsy Attending: Lora Havens Referring  Physician/Provider: Burnetta Sabin, NP Date: 07/29/2019 Duration: 23.46 minutes Patient history: 54 old female with left-sided weakness and dysphagia.  EEG evaluate for seizures. Level of alertness: Awake AEDs during EEG study: None Technical aspects: This EEG study was done with scalp electrodes positioned according to the 10-20 International system of electrode placement. Electrical activity was acquired at a sampling rate of 500Hz  and reviewed with a high frequency filter of 70Hz  and a low frequency filter of 1Hz . EEG data were recorded continuously and digitally stored. Description: The posterior dominant rhythm consists of 10-11 Hz activity of moderate voltage (25-35 uV) seen predominantly in posterior head regions, symmetric and reactive to eye opening and eye closing.  Physiologic photic driving was seen during photic stimulation.  Hyperventilation was not performed.       IMPRESSION: This study is within normal limits. No seizures or epileptiform discharges were seen throughout the recording. Lora Havens   ECHOCARDIOGRAM COMPLETE  Result Date: 07/29/2019    ECHOCARDIOGRAM REPORT   Patient Name:   Tara [name] Date of Exam: 07/29/2019 Medical Rec #:  [id number]       Height:       64.0 in Accession #:    [id number]  Weight:       168.7 lb Date of Birth:  07-29-1965      BSA:          1.820 m Patient Age:    54 years        BP:           118/89 mmHg Patient Gender: F               HR:           88 bpm. Exam Location:  Inpatient Procedure: 2D Echo, Cardiac Doppler and Color Doppler Indications:    TIA 435.9 / G45.9  History:        Patient has prior history of Echocardiogram examinations, most                 recent 07/06/2015. Risk Factors:Dyslipidemia. H/o CVA. Diastolic                 dysfunction.  Sonographer:    Clayton Lefort RDCS (AE) Referring Phys: [id number] RONDELL A SMITH IMPRESSIONS  1. Left ventricular ejection fraction, by estimation, is 60 to 65%. The left ventricle has normal function. The left  ventricle has no regional wall motion abnormalities. There is mild concentric left ventricular hypertrophy. Left ventricular diastolic parameters were normal.  2. Right ventricular systolic function is normal. The right ventricular size is normal.  3. The mitral valve is normal in structure and function. No evidence of mitral valve regurgitation. No evidence of mitral stenosis.  4. The aortic valve is normal in structure and function. Aortic valve regurgitation is not visualized. No aortic stenosis is present.  5. The inferior vena cava is normal in size with greater than 50% respiratory variability, suggesting right atrial pressure of 3 mmHg. Comparison(s): Prior images unable to be directly viewed, comparison made by report only. FINDINGS  Left Ventricle: Left ventricular ejection fraction, by estimation, is 60 to 65%. The left ventricle has normal function. The left ventricle has no regional wall motion abnormalities. The left ventricular internal cavity size was normal in size. There is  mild concentric left ventricular hypertrophy. Left ventricular diastolic parameters were normal. Normal left ventricular filling pressure. Right Ventricle: The right ventricular size is normal. No increase in right ventricular wall thickness. Right ventricular systolic function is normal. Left Atrium: Left atrial size was normal in size. Right Atrium: Right atrial size was normal in size. Pericardium: There is no evidence of pericardial effusion. Mitral Valve: The mitral valve is normal in structure and function. Normal mobility of the mitral valve leaflets. No evidence of mitral valve regurgitation. No evidence of mitral valve stenosis. Tricuspid Valve: The tricuspid valve is normal in structure. Tricuspid valve regurgitation is not demonstrated. No evidence of tricuspid stenosis. Aortic Valve: The aortic valve is normal in structure and function. Aortic valve regurgitation is not visualized. No aortic stenosis is present.  Aortic valve mean gradient measures 2.0 mmHg. Aortic valve peak gradient measures 4.4 mmHg. Aortic valve area, by VTI measures 2.67 cm. Pulmonic Valve: The pulmonic valve was normal in structure. Pulmonic valve regurgitation is not visualized. No evidence of pulmonic stenosis. Aorta: The aortic root is normal in size and structure. Venous: The inferior vena cava is normal in size with greater than 50% respiratory variability, suggesting right atrial pressure of 3 mmHg. IAS/Shunts: No atrial level shunt detected by color flow Doppler.  LEFT VENTRICLE PLAX 2D LVIDd:         4.00 cm  Diastology LVIDs:  2.60 cm  LV e' lateral:   9.57 cm/s LV PW:         1.40 cm  LV E/e' lateral: 5.7 LV IVS:        1.30 cm  LV e' medial:    7.07 cm/s LVOT diam:     2.00 cm  LV E/e' medial:  7.7 LV SV:         48 LV SV Index:   27 LVOT Area:     3.14 cm  RIGHT VENTRICLE             IVC RV Basal diam:  2.90 cm     IVC diam: 1.20 cm RV S prime:     12.60 cm/s TAPSE (M-mode): 1.7 cm LEFT ATRIUM           Index       RIGHT ATRIUM           Index LA diam:      3.50 cm 1.92 cm/m  RA Area:     10.70 cm LA Vol (A2C): 29.3 ml 16.10 ml/m RA Volume:   24.00 ml  13.19 ml/m LA Vol (A4C): 29.0 ml 15.94 ml/m  AORTIC VALVE AV Area (Vmax):    2.78 cm AV Area (Vmean):   2.64 cm AV Area (VTI):     2.67 cm AV Vmax:           105.00 cm/s AV Vmean:          74.900 cm/s AV VTI:            0.181 m AV Peak Grad:      4.4 mmHg AV Mean Grad:      2.0 mmHg LVOT Vmax:         92.90 cm/s LVOT Vmean:        62.900 cm/s LVOT VTI:          0.154 m LVOT/AV VTI ratio: 0.85  AORTA Ao Root diam: 2.80 cm MITRAL VALVE MV Area (PHT): 3.46 cm    SHUNTS MV Decel Time: 219 msec    Systemic VTI:  0.15 m MV E velocity: 54.40 cm/s  Systemic Diam: 2.00 cm MV A velocity: 53.30 cm/s MV E/A ratio:  1.02 Mihai Croitoru MD Electronically signed by Sanda Klein MD Signature Date/Time: 07/29/2019/5:09:55 PM    Final     PHYSICAL EXAM Pleasant middle-aged lady not in  distress. . Afebrile. Head is nontraumatic. Neck is supple without bruit.    Cardiac exam no murmur or gallop. Lungs are clear to auscultation. Distal pulses are well felt. Neurological Exam ;  Awake  Alert oriented x 3. Normal speech and language.eye movements full without nystagmus.fundi were not visualized. Vision acuity and fields appear normal. Hearing is normal. Palatal movements are normal. Face symmetric. Tongue midline. Normal strength, tone, reflexes and coordination. Normal sensation. Gait deferred.  NIHSS 0. Premorbid MRs 0 ASSESSMENT/PLAN Ms. TREVIA NOP is a 54 y.o. female with history of HLD, migraine and asthma presenting with HA since 2/28 who woke w/ LUE numbness and weakness, severe dysarthria and dysphagia.   Stroke:   2 tiny R parietal  infarcts embolic secondary to unknown source.Marland Kitchen PFO unclear if related to her stroke ROPE score 6 ) 62% chance pfo is related to stroke )  Code Stroke CT head hyperdense R MCA bifurcation. No acute abnormality. R maxillary sinus dz. ASPECTS 10.     CTA head & neck no LVO. Mild distal SMALL VESSEL DISEASE anterior and  posterior circulation. Neck ok   CT perfusion Unremarkable   MRI  2 tiny acute cortical R brain (posterior middle frontal gyrus and posterior R parietal lobe). Read as old cerebellar infarcts but  Dr. Leonie Man feels they are flow voids and not old stroke. Mild paranasal sinus dz.   2D Echo normal   TEE  PFO present  EEG normal   LE dopplers no DVT   LDL 151 mg%  HgbA1c 5.3  UDS not done   TSH normal  Lovenox 40 mg sq daily for VTE prophylaxis  No antithrombotic prior to admission, now on aspirin 325 mg daily. Change aspirin to 81 and add plavix 75 mg daily. Continue DAPT x 3 weeks then aspirin alone  Therapy recommendations:  pending   Disposition:  pending   Hyperlipidemia  Home meds:  crestor 10 and fish oil, crestor resumed in hospital  LDL 18m%  goal < 70  Continue statin at  discharge  Hyperglycemia  No hx diabetes  HgbA1c pending, goal < 7.0  CBGs Recent Labs    07/29/19 0357  GLUCAP 130*      SSI  Other Stroke Risk Factors  Overweight, Body mass index is 28.95 kg/m., recommend weight loss, diet and exercise as appropriate   Migraines, hormonal during menses, now in  menopausal state they are less than previously. 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  Recommend  TCD bubble study for  categorizing her PFO.  Aspirin and Plavix for 3 weeks followed by aspirin alone.  Strict control of lipids.   D/w patient and Dr TLovena LeEP.Greater than 50% time during the 25-minute visit was spent on counseling and coordination of care about her embolic stroke and discussion about evaluation, prevention and treatment and answering questions.  Discussed with patient and husband and Dr. ARowland Lathe MD   To contact Stroke Continuity provider, please refer to [email or web address] After hours, contact General Neurology

## 2019-07-30 NOTE — Anesthesia Preprocedure Evaluation (Addendum)
Anesthesia Evaluation  Patient identified by MRN, date of birth, ID band Patient awake    Reviewed: Allergy & Precautions, NPO status , Patient's Chart, lab work & pertinent test results  History of Anesthesia Complications (+) DIFFICULT AIRWAY  Airway Mallampati: II  TM Distance: >3 FB     Dental   Pulmonary asthma ,    breath sounds clear to auscultation       Cardiovascular  Rhythm:Regular Rate:Normal  History noted CG   Neuro/Psych  Headaches, CVA    GI/Hepatic negative GI ROS, Neg liver ROS,   Endo/Other    Renal/GU Renal disease     Musculoskeletal   Abdominal   Peds  Hematology   Anesthesia Other Findings   Reproductive/Obstetrics                             Anesthesia Physical Anesthesia Plan  ASA: III  Anesthesia Plan: General   Post-op Pain Management:    Induction:   PONV Risk Score and Plan: 3 and Propofol infusion  Airway Management Planned: Simple Face Mask and Nasal Cannula  Additional Equipment:   Intra-op Plan:   Post-operative Plan:   Informed Consent: I have reviewed the patients History and Physical, chart, labs and discussed the procedure including the risks, benefits and alternatives for the proposed anesthesia with the patient or authorized representative who has indicated his/her understanding and acceptance.     Dental advisory given  Plan Discussed with: CRNA and Anesthesiologist  Anesthesia Plan Comments:         Anesthesia Quick Evaluation

## 2019-07-30 NOTE — Progress Notes (Signed)
Physical Therapy Treatment Patient Details Name: Tara [name] MRN: [id number] DOB: 1965-08-10 Today's Date: 07/30/2019    History of Present Illness 54 year old female with a history of asthma, high cholesterol, migraine who presents with difficulty with word finding, difficulty swallowing and weakness in her left fingers. 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    PT Comments    Pt seated edge of bed brushing her teeth today.  She was able to dress her self both upper and lower body without assistance.  She was very motivated to ambulate and retrial stair training.  She tolerated increased activity and negotiated 18 stairs.  Plan to d/c home this pm.      Follow Up Recommendations  Outpatient PT(neuro)     Equipment Recommendations  None recommended by PT    Recommendations for Other Services       Precautions / Restrictions Precautions Precautions: None Restrictions Weight Bearing Restrictions: No    Mobility  Bed Mobility Overal bed mobility: Independent                Transfers Overall transfer level: Independent                  Ambulation/Gait Ambulation/Gait assistance: Supervision Gait Distance (Feet): 200 Feet Assistive device: None   Gait velocity: decreased   General Gait Details: No complaints of dizziness, decreased arm swing on L.  Gt speed remains slow and cautious.   Stairs Stairs: Yes Stairs assistance: Supervision Stair Management: Two rails;Alternating pattern;Forwards Number of Stairs: 18 General stair comments: No complaints after stair negotiation.  She is more confident and tolerated increased activity.   Wheelchair Mobility    Modified Rankin (Stroke Patients Only) Modified Rankin (Stroke Patients Only) Pre-Morbid Rankin Score: No symptoms Modified Rankin: Moderate disability     Balance Overall balance assessment:  Needs assistance Sitting-balance support: No upper extremity supported Sitting balance-Leahy Scale: Good       Standing balance-Leahy Scale: Good                              Cognition Arousal/Alertness: Awake/alert Behavior During Therapy: WFL for tasks assessed/performed Overall Cognitive Status: Within Functional Limits for tasks assessed                                        Exercises      General Comments        Pertinent Vitals/Pain Pain Assessment: No/denies pain    Home Living                      Prior Function            PT Goals (current goals can now be found in the care plan section) Acute Rehab PT Goals Patient Stated Goal: get better Potential to Achieve Goals: Good Progress towards PT goals: Progressing toward goals    Frequency    Min 4X/week      PT Plan Current plan remains appropriate    Co-evaluation              AM-PAC PT "6 Clicks" Mobility   Outcome Measure  Help needed turning from your back to your side while in a flat bed without using bedrails?: None Help needed moving from  lying on your back to sitting on the side of a flat bed without using bedrails?: None Help needed moving to and from a bed to a chair (including a wheelchair)?: None Help needed standing up from a chair using your arms (e.g., wheelchair or bedside chair)?: None Help needed to walk in hospital room?: A Little Help needed climbing 3-5 steps with a railing? : A Little 6 Click Score: 22    End of Session Equipment Utilized During Treatment: Gait belt Activity Tolerance: Patient tolerated treatment well Patient left: in bed;with call bell/phone within reach;with bed alarm set;with family/visitor present Nurse Communication: Mobility status PT Visit Diagnosis: Unsteadiness on feet (R26.81);Dizziness and giddiness (R42);Difficulty in walking, not elsewhere classified (R26.2)     Time: 0321-2248 PT Time  Calculation (min) (ACUTE ONLY): 22 min  Charges:  $Gait Training: 8-22 mins                     Erasmo Leventhal , PTA Acute Rehabilitation Services Pager [phone] Office [phone]     Tara [name] Tara [name] 07/30/2019, 5:40 PM

## 2019-07-30 NOTE — TOC Initial Note (Signed)
Transition of Care [facility]) - Initial/Assessment Note    Patient Details  Name: Tara [name] MRN: [id number] Date of Birth: 07-Aug-1965  [facility]) CM/SW Contact:    Pollie Friar, RN Phone Number: 07/30/2019, 1:32 PM  Clinical Narrative:                 Pt from home with spouse. Recommendations are for Outpatient therapy. CM met with the patient and she prefers [facility] but the rehab doesn't have both PT/OT. Pt agreeable to the Neurorehab. Orders in Epic and information on the AVS. Pt has transportation home when medically ready.   Expected Discharge Plan: OP Rehab Barriers to Discharge: Continued Medical Work up   Patient Goals and CMS Choice     Choice offered to / list presented to : Patient  Expected Discharge Plan and Services Expected Discharge Plan: OP Rehab   Discharge Planning Services: CM Consult   Living arrangements for the past 2 months: Single Family Home                                      Prior Living Arrangements/Services Living arrangements for the past 2 months: Single Family Home Lives with:: Spouse Patient language and need for interpreter reviewed:: Yes Do you feel safe going back to the place where you live?: Yes      Need for Family Participation in Patient Care: No (Comment) Care giver support system in place?: Yes (comment)   Criminal Activity/Legal Involvement Pertinent to Current Situation/Hospitalization: No - Comment as needed  Activities of Daily Living Home Assistive Devices/Equipment: None ADL Screening (condition at time of admission) Patient's cognitive ability adequate to safely complete daily activities?: Yes Is the patient deaf or have difficulty hearing?: No Does the patient have difficulty seeing, even when wearing glasses/contacts?: No Does the patient have difficulty concentrating, remembering, or making decisions?: No Patient able to express need for assistance with ADLs?: Yes Does the patient have  difficulty dressing or bathing?: No Independently performs ADLs?: Yes (appropriate for developmental age) Does the patient have difficulty walking or climbing stairs?: No Weakness of Legs: Both Weakness of Arms/Hands: Left  Permission Sought/Granted                  Emotional Assessment Appearance:: Appears stated age Attitude/Demeanor/Rapport: Engaged Affect (typically observed): Accepting Orientation: : Oriented to Self, Oriented to Place, Oriented to  Time, Oriented to Situation   Psych Involvement: No (comment)  Admission diagnosis:  TIA (transient ischemic attack) [G45.9] Stroke-like symptom [R29.90] CVA (cerebral vascular accident) [facility]) [I63.9] Patient Active Problem List   Diagnosis Date Noted  . CVA (cerebral vascular accident) ([place]) 07/29/2019  . Dysphagia 07/29/2019  . Left hand weakness 07/29/2019  . Palpitations 09/15/2015  . Diastolic dysfunction 49/67/5916  . Renal cyst, left 06/19/2013  . History of migraine headaches 03/05/2013  . Complicated migraine 38/46/6599  . HLD (hyperlipidemia) 03/05/2013  . 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 Rotator cuff syndrome 03/05/2013  . Low back pain 03/05/2013   PCP:  Vernie Shanks, MD Pharmacy:   [facility] [phone] [address], [address] AT [facility] [address] [place] [address] [place] [address] Phone: [phone] Fax: [place], [place] - [place] [address] Phone: [phone] Fax: [phone]  Social Determinants of Health (SDOH) Interventions    Readmission Risk Interventions No flowsheet data found.

## 2019-07-30 NOTE — Anesthesia Procedure Notes (Signed)
Procedure Name: MAC Date/Time: 07/30/2019 12:09 PM Performed by: Colin Benton, CRNA Pre-anesthesia Checklist: Patient identified, Emergency Drugs available, Patient being monitored and Suction available Patient Re-evaluated:Patient Re-evaluated prior to induction Oxygen Delivery Method: Nasal cannula Induction Type: IV induction Placement Confirmation: positive ETCO2 Dental Injury: Teeth and Oropharynx as per pre-operative assessment

## 2019-07-30 NOTE — Consult Note (Addendum)
ELECTROPHYSIOLOGY CONSULT NOTE  Patient ID: Tara [name] MRN: [id number], DOB/AGE: 1966/03/22   Admit date: 07/29/2019 Date of Consult: 07/30/2019  Primary Physician: [name], MD Primary Cardiologist: Dr. Jens Som (2017) Reason for Consultation: Cryptogenic stroke ; recommendations regarding Implantable Loop Recorder, requested by Dr. Pearlean Brownie  History of Present Illness Tara [name] was admitted on 07/29/2019 with .  They first developed symptoms woke up at around 3 in the morning with speech and word finding difficulties as well as left hand weakness with some dystonic posture.  Imaging/neurology noted 2 tiny R parietal  infarcts embolic secondary to unknown source.  PMHx includes: diastolic dysfunction, HLD, migraine HAs, and asthma   she has undergone workup for stroke including echocardiogram and carotid angio.  The patient has been monitored on telemetry which has demonstrated sinus rhythm with no arrhythmias.  Inpatient stroke work-up is to be completed with a TEE.     Echocardiogram this admission demonstrated   IMPRESSIONS  1. Left ventricular ejection fraction, by estimation, is 60 to 65%. The  left ventricle has normal function. The left ventricle has no regional  wall motion abnormalities. There is mild concentric left ventricular  hypertrophy. Left ventricular diastolic  parameters were normal.  2. Right ventricular systolic function is normal. The right ventricular  size is normal.  3. The mitral valve is normal in structure and function. No evidence of  mitral valve regurgitation. No evidence of mitral stenosis.  4. The aortic valve is normal in structure and function. Aortic valve  regurgitation is not visualized. No aortic stenosis is present.  5. The inferior vena cava is normal in size with greater than 50%  respiratory variability, suggesting right atrial pressure of 3 mmHg.    Lab work is reviewed.  Prior to admission, the patient denies chest  pain, shortness of breath, dizziness, palpitations, or syncope.  She is recovering from her stroke with plans to home at discharge.   Past Medical History:  Diagnosis Date  . Asthma   . Hyperlipidemia   . Migraine   . Rotator cuff tear   . Spondylolisthesis    L4-5     Surgical History:  Past Surgical History:  Procedure Laterality Date  . No prior surgery       Medications Prior to Admission  Medication Sig Dispense Refill Last Dose  . cholecalciferol (VITAMIN D) 1000 UNITS tablet Take 1,000 Units by mouth daily.   07/28/2019 at Unknown time  . Omega-3 Fatty Acids (FISH OIL) 500 MG CAPS Natures made Fish oil pearls 500mg  BID (Patient taking differently: Take 1 capsule by mouth daily. Natures made Fish oil pearls 500mg  BID) 180 capsule 1 07/28/2019 at Unknown time  . Probiotic Product (PROBIOTIC PO) Take 1 capsule by mouth daily.    07/28/2019 at Unknown time  . rizatriptan (MAXALT) 10 MG tablet Take 10 mg by mouth as needed for migraine (headache).      . rosuvastatin (CRESTOR) 10 MG tablet Take 10 mg by mouth every Monday, Wednesday, and Friday.    07/28/2019 at Unknown time    Inpatient Medications:  . aspirin EC  81 mg Oral Daily  . clopidogrel  75 mg Oral Daily  . enoxaparin (LOVENOX) injection  40 mg Subcutaneous Q24H  . rosuvastatin  40 mg Oral Daily    Allergies:  Allergies  Allergen Reactions  . Tape Rash    Social History   Socioeconomic History  . Marital status: Married    Spouse name:  Not on file  . Number of children: 2  . Years of education: BSN  . Highest education level: Not on file  Occupational History  . Occupation: [employer]  Tobacco Use  . Smoking status: Never Smoker  . Smokeless tobacco: Never Used  Substance and Sexual Activity  . Alcohol use: No    Alcohol/week: 0.0 standard drinks    Comment: Rare  . Drug use: No  . Sexual activity: Yes    Birth control/protection: None  Other Topics Concern  . Not on file  Social History Narrative     Lives at home w/ her husband and son   Right-handed   Caffeine: seldom, soda on the weekend   Social Determinants of Health   Financial Resource Strain:   . Difficulty of Paying Living Expenses: Not on file  Food Insecurity:   . Worried About Programme researcher, broadcasting/film/video in the Last Year: Not on file  . Ran Out of Food in the Last Year: Not on file  Transportation Needs:   . Lack of Transportation (Medical): Not on file  . Lack of Transportation (Non-Medical): Not on file  Physical Activity:   . Days of Exercise per Week: Not on file  . Minutes of Exercise per Session: Not on file  Stress:   . Feeling of Stress : Not on file  Social Connections:   . Frequency of Communication with Friends and Family: Not on file  . Frequency of Social Gatherings with Friends and Family: Not on file  . Attends Religious Services: Not on file  . Active Member of Clubs or Organizations: Not on file  . Attends Banker Meetings: Not on file  . Marital Status: Not on file  Intimate Partner Violence:   . Fear of Current or Ex-Partner: Not on file  . Emotionally Abused: Not on file  . Physically Abused: Not on file  . Sexually Abused: Not on file     Family History  Problem Relation Age of Onset  . Heart disease Father 81       MI  . Hypertension Maternal Grandmother   . Hyperlipidemia Brother       Review of Systems: All other systems reviewed and are otherwise negative except as noted above.  Physical Exam: Vitals:   07/29/19 2340 07/30/19 0347 07/30/19 0759 07/30/19 0851  BP: 103/77 115/66 126/80   Pulse: 82 77 67   Resp:  16 16 18   Temp: 98.5 F (36.9 C) 97.9 F (36.6 C)    TempSrc: Oral Oral    SpO2: 97% 96% 100%   Weight:      Height:        GEN- The patient is well appearing, alert and oriented x 3 today.   Head- normocephalic, atraumatic Eyes-  Sclera clear, conjunctiva pink Ears- hearing intact Oropharynx- clear Neck- supple Lungs- CTA b/l, normal work of  breathing Heart- RRR, no murmurs, rubs or gallops  GI- soft, NT, ND Extremities- no clubbing, cyanosis, or edema MS- no significant deformity or atrophy Skin- no rash or lesion Psych- euthymic mood, full affect   Labs:   Lab Results  Component Value Date   WBC 4.9 07/30/2019   HGB 12.5 07/30/2019   HCT 37.4 07/30/2019   MCV 93.7 07/30/2019   PLT 353 07/30/2019    Recent Labs  Lab 07/29/19 0406 07/29/19 0406 07/30/19 0639  NA 138   < > 140  K 3.7   < > 4.0  CL 103   < >  105  CO2 26   < > 25  BUN 15   < > 14  CREATININE 0.88   < > 0.84  CALCIUM 9.7   < > 9.6  PROT 6.9  --   --   BILITOT 0.4  --   --   ALKPHOS 52  --   --   ALT 27  --   --   AST 21  --   --   GLUCOSE 134*   < > 114*   < > = values in this interval not displayed.   No results found for: CKTOTAL, CKMB, CKMBINDEX, TROPONINI Lab Results  Component Value Date   CHOL 241 (H) 07/29/2019   CHOL 229 (H) 06/22/2014   CHOL 211 (H) 04/18/2013   Lab Results  Component Value Date   HDL 51 07/29/2019   HDL 49 06/22/2014   HDL 48 04/18/2013   Lab Results  Component Value Date   LDLCALC 151 (H) 07/29/2019   LDLCALC 152 (H) 06/22/2014   LDLCALC 146 (H) 04/18/2013   Lab Results  Component Value Date   TRIG 193 (H) 07/29/2019   TRIG 141 06/22/2014   TRIG 85 04/18/2013   Lab Results  Component Value Date   CHOLHDL 4.7 07/29/2019   CHOLHDL 4.7 06/22/2014   CHOLHDL 4.4 04/18/2013   No results found for: LDLDIRECT  No results found for: DDIMER   Radiology/Studies:   CT Code Stroke CTA Head W/WO contrast Result Date: 07/29/2019 CLINICAL DATA:  Left-sided weakness. Aphasia. Symptoms are improving. Abnormal CT of the head. EXAM: CT ANGIOGRAPHY HEAD AND NECK CT PERFUSION BRAIN TECHNIQUE: Multidetector CT imaging of the head and neck was performed using the standard protocol during bolus administration of intravenous contrast. Multiplanar CT image reconstructions and MIPs were obtained to evaluate the  vascular anatomy. Carotid stenosis measurements (when applicable) are obtained utilizing NASCET criteria, using the distal internal carotid diameter as the denominator. Multiphase CT imaging of the brain was performed following IV bolus contrast injection. Subsequent parametric perfusion maps were calculated using RAPID software. CONTRAST:  OMNIPAQUE IOHEXOL 350 MG/ML SOLN COMPARISON:  None. FINDINGS: CTA NECK FINDINGS Aortic arch: A 3 vessel arch configuration is present. No significant atherosclerotic changes present. No aneurysm or stenosis is present. Right carotid system: The right common carotid artery is within normal limits. The bifurcation is unremarkable. Cervical right ICA is normal. Left carotid system: Mild tortuosity is present in the proximal left common carotid artery without significant stenosis. The bifurcation is within normal limits. Cervical left ICA is normal. Vertebral arteries: Both vertebral arteries originate from the subclavian arteries. The left vertebral artery is slightly larger. No significant stenosis is present in either vertebral artery in the neck. Skeleton: Some straightening of the normal cervical lordosis is noted. No significant listhesis is present. Vertebral body heights and alignment are normal. Other neck: The soft tissues of the neck are otherwise normal. Upper chest: Mild ground-glass attenuation is present at the lung apices bilaterally, likely reflecting atelectasis or edema. No significant airspace consolidation is present. Thoracic inlet is normal. Review of the MIP images confirms the above findings CTA HEAD FINDINGS Anterior circulation: Minimal atherosclerotic changes are present within the cavernous internal carotid arteries bilaterally. No significant stenosis is present through the ICA termini. Mild irregularity is present in the left A1 segment without significant stenosis. MCA bifurcations are intact. There is some irregularity of distal ACA and MCA  branch vessels without a significant proximal stenosis or occlusion. Posterior circulation: The  left vertebral artery is dominant. PICA origins are visualized and normal. Vertebrobasilar junction is normal. The basilar artery is normal. Both posterior cerebral arteries originate from the basilar tip. Irregularity of distal PCA branch vessels is present without a significant proximal stenosis or occlusion. Venous sinuses: The dural sinuses are patent. The straight sinus deep cerebral veins are intact. Cortical veins are unremarkable. No vascular malformation present. Anatomic variants: None Review of the MIP images confirms the above findings CT Brain Perfusion Findings: ASPECTS: 10/10 CBF (<30%) Volume: 40mL Perfusion (Tmax>6.0s) volume: 30mL Mismatch Volume: 59mL Infarction Location:n/a IMPRESSION: 1. No emergent large vessel occlusion. 2. Normal CT perfusion of the head. 3. Mild distal small vessel irregularity in both the anterior and posterior circulation suggesting intracranial atherosclerotic change without a proximal stenosis, aneurysm or branch vessel occlusion. 4. No significant stenosis in the neck. The above was relayed via text pager to Dr. Roland Rack on 07/29/2019 at 04:53 . Electronically Signed   By: San Morelle M.D.   On: 07/29/2019 05:00     DG Chest 2 View Result Date: 07/29/2019 CLINICAL DATA:  TIA at 3 a.m. this morning EXAM: CHEST - 2 VIEW COMPARISON:  05/31/2015 FINDINGS: Normal heart size, mediastinal contours, and pulmonary vascularity. Lungs clear. No pleural effusion or pneumothorax. Bones unremarkable. IMPRESSION: No acute abnormalities. Electronically Signed   By: Lavonia Dana M.D.   On: 07/29/2019 09:00     MR BRAIN WO CONTRAST Result Date: 07/29/2019 CLINICAL DATA:  54 year old female code stroke presentation this morning left side weakness, aphasia. EXAM: MRI HEAD WITHOUT CONTRAST TECHNIQUE: Multiplanar, multiecho pulse sequences of the brain and surrounding  structures were obtained without intravenous contrast. COMPARISON:  Plain CT head, CTA and CTP earlier today. FINDINGS: Brain: There are 2 tiny foci of diffusion restriction in the right hemisphere. A small cortical focus at the right middle frontal gyrus is noted on series 5, image 84. There is a small right parietal cortical or subcortical focus on image 80. No other restricted diffusion. There are several small chronic infarcts in the cerebellum, more so the left. No midline shift, mass effect, evidence of mass lesion, ventriculomegaly, extra-axial collection or acute intracranial hemorrhage. Cervicomedullary junction and pituitary are within normal limits. No chronic cortical encephalomalacia identified. There is a small focus of chronic microhemorrhage, or perhaps a very distal vascular thrombus in the right parietal lobe near the DWI abnormality on series 14, image 36. Deep gray nuclei and brainstem are negative. Vascular: Major intracranial vascular flow voids are preserved. Skull and upper cervical spine: Negative. Sinuses/Orbits: Negative orbits. Small fluid level in the right maxillary sinus with mild mucosal thickening elsewhere. Other: Mastoids are clear. Grossly negative internal auditory structures. Scalp and face soft tissues appear negative. IMPRESSION: 1. Two tiny acute cortical infarcts in the right hemisphere: Posterior right middle frontal gyrus and posterior right parietal lobe. No hemorrhagic transformation or associated mass effect. 2. Small chronic cerebellar infarcts, most on the left. 3. Mild paranasal sinus sinus inflammation. Electronically Signed   By: Genevie Ann M.D.   On: 07/29/2019 09:25     EEG adult Result Date: 07/29/2019 Lora Havens, MD     07/29/2019  2:18 PM Patient Name: Tara [name] MRN: [id number] Epilepsy Attending: Lora Havens Referring Physician/Provider: Burnetta Sabin, NP Date: 07/29/2019 Duration: 23.46 minutes Patient history: 60 old female with left-sided  weakness and dysphagia.  EEG evaluate for seizures. Level of alertness: Awake AEDs during EEG study: None Technical aspects: This EEG study was done  with scalp electrodes positioned according to the 10-20 International system of electrode placement. Electrical activity was acquired at a sampling rate of 500Hz  and reviewed with a high frequency filter of 70Hz  and a low frequency filter of 1Hz . EEG data were recorded continuously and digitally stored. Description: The posterior dominant rhythm consists of 10-11 Hz activity of moderate voltage (25-35 uV) seen predominantly in posterior head regions, symmetric and reactive to eye opening and eye closing.  Physiologic photic driving was seen during photic stimulation.  Hyperventilation was not performed.       IMPRESSION: This study is within normal limits. No seizures or epileptiform discharges were seen throughout the recording. Priyanka O Yadav     VAS LOWER EXTREMITY VENOUS (DVT) Result Date: 07/29/2019  Lower Venous DVTStudy Indications: Stroke.  Risk Factors: None identified. Comparison Study: No prior studies. Performing Technologist: RVT  Examination Guidelines: A complete evaluation includes B-mode imaging, spectral Doppler, color Doppler, and power Doppler as needed of all accessible portions of each vessel. Bilateral testing is considered an integral part of a complete examination. Limited examinations for reoccurring indications may be performed as noted. The reflux portion of the exam is performed with the patient in reverse Trendelenburg.   Summary: RIGHT: - There is no evidence of deep vein thrombosis in the lower extremity.  - No cystic structure found in the popliteal fossa.  LEFT: - There is no evidence of deep vein thrombosis in the lower extremity.  - No cystic structure found in the popliteal fossa.  *See table(s) above for measurements and observations. Electronically signed by [place] MD on 07/29/2019 at 4:43:28  PM.    Final     12-lead ECG SR All prior EKG's in EPIC reviewed with no documented atrial fibrillation  Telemetry SR  Assessment and Plan:  1. Cryptogenic stroke The patient presents with cryptogenic stroke.  The patient has a TEE planned for this PM.  She is a working Chanda Busing, we spoke at length about rational for monitoring for afib with either a 30 day event monitor or an implantable loop recorder.  Risks, benefits, and alteratives to implantable loop recorder were discussed with the patient today.   At this time, the patient is very clear in her decision to proceed with implantable loop recorder.   Wound care was reviewed with the patient (keep incision clean and dry for 3 days).  Wound check will be scheduled for the patient   Please call with questions.   Renee Waverly Ferrari, PA-C 07/30/2019  EP Attending  Patient seen and examined. Agree with the findings as noted above. The patient presents with a cryptogenic stroke. I have discussed the treatment options including the risks/benefits/goals/expectations of ILR insertion and she wishes to proceed if her TEE is negative.  Engineer, civil (consulting).D.

## 2019-07-30 NOTE — Final Progress Note (Signed)
PIV removed. AVS discussed with patient and husband, all questions and concerns answered. Discharge home-self care. At time of discharge, patient acknowledged having all belongings including loop recorder box, purse, two cell phones, and charger. Return to work form given to patient. Pt wheeled out to car and transported home by husband.

## 2019-07-30 NOTE — Progress Notes (Addendum)
SLP Cancellation Note  Patient Details Name: Tara [name] MRN: [id number] DOB: 08-25-1965   Cancelled treatment:        MBS recommended. She is NPO and is currently having TEE. MBS will likely not be able to be completed today unless she is out of procedure soon, is adequately awake (suspect she will be) and radiology can accommodate. Likely will be tomorrow until [facility] completed. She can resume reg/thin once able to do so after procedure.   Update: getting loop recorder this afternoon. Will see tomorrow for MBS.    Royce Macadamia 07/30/2019, 11:47 AM   Breck Coons Lonell Face.Ed Nurse, children's [phone] Office [phone]

## 2019-07-30 NOTE — Progress Notes (Signed)
  Echocardiogram Echocardiogram Transesophageal has been performed.  Rayburn Mundis A Ayoub Arey 07/30/2019, 12:42 PM

## 2019-07-30 NOTE — Anesthesia Postprocedure Evaluation (Signed)
Anesthesia Post Note  Patient: Tara [name]  Procedure(s) Performed: TRANSESOPHAGEAL ECHOCARDIOGRAM (TEE) (N/A ) BUBBLE STUDY     Patient location during evaluation: Endoscopy Anesthesia Type: General Level of consciousness: awake Pain management: pain level controlled Vital Signs Assessment: post-procedure vital signs reviewed and stable Respiratory status: spontaneous breathing Cardiovascular status: stable Postop Assessment: no apparent nausea or vomiting Anesthetic complications: no    Last Vitals:  Vitals:   07/30/19 1255 07/30/19 1305  BP: 105/78 113/68  Pulse: 75 78  Resp: 20 (!) 21  Temp:    SpO2: 99% 98%    Last Pain:  Vitals:   07/30/19 1235  TempSrc: Temporal  PainSc: 0-No pain                 [name]

## 2019-07-30 NOTE — Progress Notes (Signed)
OT Cancellation Note  Patient Details Name: Tara [name] MRN: [id number] DOB: Sep 02, 1965   Cancelled Treatment:    Reason Eval/Treat Not Completed: (P) Patient at procedure or test/ unavailable(cath lab)  [facility] 07/30/2019, 12:51 PM  Luisa Dago, OT/L   Acute OT Clinical Specialist Acute Rehabilitation Services Pager [phone] Office [phone]

## 2019-07-30 NOTE — Discharge Instructions (Signed)
Implant site/wound care instructions °Keep incision clean and dry for 3 days. °You can remove outer dressing tomorrow. °Leave steri-strips (little pieces of tape) on until seen in the office for wound check appointment. °Call the office ([phone]) for redness, drainage, swelling, or fever. ° °

## 2019-07-30 NOTE — CV Procedure (Signed)
    TRANSESOPHAGEAL ECHOCARDIOGRAM   NAME:  Tara [name]   MRN: [id number] DOB:  April 27, 1966   ADMIT DATE: 07/29/2019  INDICATIONS: CVA  PROCEDURE:   Informed consent was obtained prior to the procedure. The risks, benefits and alternatives for the procedure were discussed and the patient comprehended these risks.  Risks include, but are not limited to, cough, sore throat, vomiting, nausea, somnolence, esophageal and stomach trauma or perforation, bleeding, low blood pressure, aspiration, pneumonia, infection, trauma to the teeth and death.    After a procedural time-out, the oropharynx was anesthetized and the patient was sedated by the anesthesia service. The transesophageal probe was inserted in the esophagus and stomach without difficulty and multiple views were obtained. Anesthesia was monitored by Dr. Chilton Si and Annabelle Harman, CRNA.  The transesophageal probe was inserted in the esophagus and stomach without difficulty and multiple views were obtained.    COMPLICATIONS:    There were no immediate complications.  FINDINGS:  LEFT VENTRICLE: EF = 60-65%.  No regional wall motion abnormalities.  RIGHT VENTRICLE: Normal size and function.   LEFT ATRIUM: No thrombus/mass.  LEFT ATRIAL APPENDAGE: No thrombus/mass.   RIGHT ATRIUM: No thrombus/mass.  AORTIC VALVE:  Trileaflet. No regurgitation. No vegetation.  MITRAL VALVE:    Normal structure. No regurgitation. No vegetation.  TRICUSPID VALVE: Normal structure. No regurgitation. No vegetation.  PULMONIC VALVE: Grossly normal structure. No regurgitation. No apparent vegetation.  INTERATRIAL SEPTUM: Positive bubble study, consistent with PFO  PERICARDIUM: No effusion noted.  DESCENDING AORTA: Normal size   CONCLUSION: Positive bubble study, consistent with PFO  Epifanio Lesches MD [facility]  [address], [address] [place], [place] [address] [phone]   12:33 PM

## 2019-07-30 NOTE — H&P (View-Only) (Signed)
ELECTROPHYSIOLOGY CONSULT NOTE  Patient ID: [name]: [id number], DOB/AGE: 1966/03/22   Admit date: 07/29/2019 Date of Consult: 07/30/2019  Primary Physician: [name], MD Primary Cardiologist: Dr. Jens Som (2017) Reason for Consultation: Cryptogenic stroke ; recommendations regarding Implantable Loop Recorder, requested by Dr. Pearlean Brownie  History of Present Illness Tara [name] was admitted on 07/29/2019 with .  They first developed symptoms woke up at around 3 in the morning with speech and word finding difficulties as well as left hand weakness with some dystonic posture.  Imaging/neurology noted 2 tiny R parietal  infarcts embolic secondary to unknown source.  PMHx includes: diastolic dysfunction, HLD, migraine HAs, and asthma   she has undergone workup for stroke including echocardiogram and carotid angio.  The patient has been monitored on telemetry which has demonstrated sinus rhythm with no arrhythmias.  Inpatient stroke work-up is to be completed with a TEE.     Echocardiogram this admission demonstrated   IMPRESSIONS  1. Left ventricular ejection fraction, by estimation, is 60 to 65%. The  left ventricle has normal function. The left ventricle has no regional  wall motion abnormalities. There is mild concentric left ventricular  hypertrophy. Left ventricular diastolic  parameters were normal.  2. Right ventricular systolic function is normal. The right ventricular  size is normal.  3. The mitral valve is normal in structure and function. No evidence of  mitral valve regurgitation. No evidence of mitral stenosis.  4. The aortic valve is normal in structure and function. Aortic valve  regurgitation is not visualized. No aortic stenosis is present.  5. The inferior vena cava is normal in size with greater than 50%  respiratory variability, suggesting right atrial pressure of 3 mmHg.    Lab work is reviewed.  Prior to admission, the patient denies chest  pain, shortness of breath, dizziness, palpitations, or syncope.  She is recovering from her stroke with plans to home at discharge.   Past Medical History:  Diagnosis Date  . Asthma   . Hyperlipidemia   . Migraine   . Rotator cuff tear   . Spondylolisthesis    L4-5     Surgical History:  Past Surgical History:  Procedure Laterality Date  . No prior surgery       Medications Prior to Admission  Medication Sig Dispense Refill Last Dose  . cholecalciferol (VITAMIN D) 1000 UNITS tablet Take 1,000 Units by mouth daily.   07/28/2019 at Unknown time  . Omega-3 Fatty Acids (FISH OIL) 500 MG CAPS Natures made Fish oil pearls 500mg  BID (Patient taking differently: Take 1 capsule by mouth daily. Natures made Fish oil pearls 500mg  BID) 180 capsule 1 07/28/2019 at Unknown time  . Probiotic Product (PROBIOTIC PO) Take 1 capsule by mouth daily.    07/28/2019 at Unknown time  . rizatriptan (MAXALT) 10 MG tablet Take 10 mg by mouth as needed for migraine (headache).      . rosuvastatin (CRESTOR) 10 MG tablet Take 10 mg by mouth every Monday, Wednesday, and Friday.    07/28/2019 at Unknown time    Inpatient Medications:  . aspirin EC  81 mg Oral Daily  . clopidogrel  75 mg Oral Daily  . enoxaparin (LOVENOX) injection  40 mg Subcutaneous Q24H  . rosuvastatin  40 mg Oral Daily    Allergies:  Allergies  Allergen Reactions  . Tape Rash    Social History   Socioeconomic History  . Marital status: Married    Spouse name:  Not on file  . Number of children: 2  . Years of education: BSN  . Highest education level: Not on file  Occupational History  . Occupation: [employer]  Tobacco Use  . Smoking status: Never Smoker  . Smokeless tobacco: Never Used  Substance and Sexual Activity  . Alcohol use: No    Alcohol/week: 0.0 standard drinks    Comment: Rare  . Drug use: No  . Sexual activity: Yes    Birth control/protection: None  Other Topics Concern  . Not on file  Social History Narrative     Lives at home w/ her husband and son   Right-handed   Caffeine: seldom, soda on the weekend   Social Determinants of Health   Financial Resource Strain:   . Difficulty of Paying Living Expenses: Not on file  Food Insecurity:   . Worried About Programme researcher, broadcasting/film/video in the Last Year: Not on file  . Ran Out of Food in the Last Year: Not on file  Transportation Needs:   . Lack of Transportation (Medical): Not on file  . Lack of Transportation (Non-Medical): Not on file  Physical Activity:   . Days of Exercise per Week: Not on file  . Minutes of Exercise per Session: Not on file  Stress:   . Feeling of Stress : Not on file  Social Connections:   . Frequency of Communication with Friends and Family: Not on file  . Frequency of Social Gatherings with Friends and Family: Not on file  . Attends Religious Services: Not on file  . Active Member of Clubs or Organizations: Not on file  . Attends Banker Meetings: Not on file  . Marital Status: Not on file  Intimate Partner Violence:   . Fear of Current or Ex-Partner: Not on file  . Emotionally Abused: Not on file  . Physically Abused: Not on file  . Sexually Abused: Not on file     Family History  Problem Relation Age of Onset  . Heart disease Father 81       MI  . Hypertension Maternal Grandmother   . Hyperlipidemia Brother       Review of Systems: All other systems reviewed and are otherwise negative except as noted above.  Physical Exam: Vitals:   07/29/19 2340 07/30/19 0347 07/30/19 0759 07/30/19 0851  BP: 103/77 115/66 126/80   Pulse: 82 77 67   Resp:  16 16 18   Temp: 98.5 F (36.9 C) 97.9 F (36.6 C)    TempSrc: Oral Oral    SpO2: 97% 96% 100%   Weight:      Height:        GEN- The patient is well appearing, alert and oriented x 3 today.   Head- normocephalic, atraumatic Eyes-  Sclera clear, conjunctiva pink Ears- hearing intact Oropharynx- clear Neck- supple Lungs- CTA b/l, normal work of  breathing Heart- RRR, no murmurs, rubs or gallops  GI- soft, NT, ND Extremities- no clubbing, cyanosis, or edema MS- no significant deformity or atrophy Skin- no rash or lesion Psych- euthymic mood, full affect   Labs:   Lab Results  Component Value Date   WBC 4.9 07/30/2019   HGB 12.5 07/30/2019   HCT 37.4 07/30/2019   MCV 93.7 07/30/2019   PLT 353 07/30/2019    Recent Labs  Lab 07/29/19 0406 07/29/19 0406 07/30/19 0639  NA 138   < > 140  K 3.7   < > 4.0  CL 103   < >  105  CO2 26   < > 25  BUN 15   < > 14  CREATININE 0.88   < > 0.84  CALCIUM 9.7   < > 9.6  PROT 6.9  --   --   BILITOT 0.4  --   --   ALKPHOS 52  --   --   ALT 27  --   --   AST 21  --   --   GLUCOSE 134*   < > 114*   < > = values in this interval not displayed.   No results found for: CKTOTAL, CKMB, CKMBINDEX, TROPONINI Lab Results  Component Value Date   CHOL 241 (H) 07/29/2019   CHOL 229 (H) 06/22/2014   CHOL 211 (H) 04/18/2013   Lab Results  Component Value Date   HDL 51 07/29/2019   HDL 49 06/22/2014   HDL 48 04/18/2013   Lab Results  Component Value Date   LDLCALC 151 (H) 07/29/2019   LDLCALC 152 (H) 06/22/2014   LDLCALC 146 (H) 04/18/2013   Lab Results  Component Value Date   TRIG 193 (H) 07/29/2019   TRIG 141 06/22/2014   TRIG 85 04/18/2013   Lab Results  Component Value Date   CHOLHDL 4.7 07/29/2019   CHOLHDL 4.7 06/22/2014   CHOLHDL 4.4 04/18/2013   No results found for: LDLDIRECT  No results found for: DDIMER   Radiology/Studies:   CT Code Stroke CTA Head W/WO contrast Result Date: 07/29/2019 CLINICAL DATA:  Left-sided weakness. Aphasia. Symptoms are improving. Abnormal CT of the head. EXAM: CT ANGIOGRAPHY HEAD AND NECK CT PERFUSION BRAIN TECHNIQUE: Multidetector CT imaging of the head and neck was performed using the standard protocol during bolus administration of intravenous contrast. Multiplanar CT image reconstructions and MIPs were obtained to evaluate the  vascular anatomy. Carotid stenosis measurements (when applicable) are obtained utilizing NASCET criteria, using the distal internal carotid diameter as the denominator. Multiphase CT imaging of the brain was performed following IV bolus contrast injection. Subsequent parametric perfusion maps were calculated using RAPID software. CONTRAST:  100mL OMNIPAQUE IOHEXOL 350 MG/ML SOLN COMPARISON:  None. FINDINGS: CTA NECK FINDINGS Aortic arch: A 3 vessel arch configuration is present. No significant atherosclerotic changes present. No aneurysm or stenosis is present. Right carotid system: The right common carotid artery is within normal limits. The bifurcation is unremarkable. Cervical right ICA is normal. Left carotid system: Mild tortuosity is present in the proximal left common carotid artery without significant stenosis. The bifurcation is within normal limits. Cervical left ICA is normal. Vertebral arteries: Both vertebral arteries originate from the subclavian arteries. The left vertebral artery is slightly larger. No significant stenosis is present in either vertebral artery in the neck. Skeleton: Some straightening of the normal cervical lordosis is noted. No significant listhesis is present. Vertebral body heights and alignment are normal. Other neck: The soft tissues of the neck are otherwise normal. Upper chest: Mild ground-glass attenuation is present at the lung apices bilaterally, likely reflecting atelectasis or edema. No significant airspace consolidation is present. Thoracic inlet is normal. Review of the MIP images confirms the above findings CTA HEAD FINDINGS Anterior circulation: Minimal atherosclerotic changes are present within the cavernous internal carotid arteries bilaterally. No significant stenosis is present through the ICA termini. Mild irregularity is present in the left A1 segment without significant stenosis. MCA bifurcations are intact. There is some irregularity of distal ACA and MCA  branch vessels without a significant proximal stenosis or occlusion. Posterior circulation: The  left vertebral artery is dominant. PICA origins are visualized and normal. Vertebrobasilar junction is normal. The basilar artery is normal. Both posterior cerebral arteries originate from the basilar tip. Irregularity of distal PCA branch vessels is present without a significant proximal stenosis or occlusion. Venous sinuses: The dural sinuses are patent. The straight sinus deep cerebral veins are intact. Cortical veins are unremarkable. No vascular malformation present. Anatomic variants: None Review of the MIP images confirms the above findings CT Brain Perfusion Findings: ASPECTS: 10/10 CBF (<30%) Volume: 40mL Perfusion (Tmax>6.0s) volume: 30mL Mismatch Volume: 59mL Infarction Location:n/a IMPRESSION: 1. No emergent large vessel occlusion. 2. Normal CT perfusion of the head. 3. Mild distal small vessel irregularity in both the anterior and posterior circulation suggesting intracranial atherosclerotic change without a proximal stenosis, aneurysm or branch vessel occlusion. 4. No significant stenosis in the neck. The above was relayed via text pager to Dr. Roland Rack on 07/29/2019 at 04:53 . Electronically Signed   By: San Morelle M.D.   On: 07/29/2019 05:00     DG Chest 2 View Result Date: 07/29/2019 CLINICAL DATA:  TIA at 3 a.m. this morning EXAM: CHEST - 2 VIEW COMPARISON:  05/31/2015 FINDINGS: Normal heart size, mediastinal contours, and pulmonary vascularity. Lungs clear. No pleural effusion or pneumothorax. Bones unremarkable. IMPRESSION: No acute abnormalities. Electronically Signed   By: Lavonia Dana M.D.   On: 07/29/2019 09:00     MR BRAIN WO CONTRAST Result Date: 07/29/2019 CLINICAL DATA:  54 year old female code stroke presentation this morning left side weakness, aphasia. EXAM: MRI HEAD WITHOUT CONTRAST TECHNIQUE: Multiplanar, multiecho pulse sequences of the brain and surrounding  structures were obtained without intravenous contrast. COMPARISON:  Plain CT head, CTA and CTP earlier today. FINDINGS: Brain: There are 2 tiny foci of diffusion restriction in the right hemisphere. A small cortical focus at the right middle frontal gyrus is noted on series 5, image 84. There is a small right parietal cortical or subcortical focus on image 80. No other restricted diffusion. There are several small chronic infarcts in the cerebellum, more so the left. No midline shift, mass effect, evidence of mass lesion, ventriculomegaly, extra-axial collection or acute intracranial hemorrhage. Cervicomedullary junction and pituitary are within normal limits. No chronic cortical encephalomalacia identified. There is a small focus of chronic microhemorrhage, or perhaps a very distal vascular thrombus in the right parietal lobe near the DWI abnormality on series 14, image 36. Deep gray nuclei and brainstem are negative. Vascular: Major intracranial vascular flow voids are preserved. Skull and upper cervical spine: Negative. Sinuses/Orbits: Negative orbits. Small fluid level in the right maxillary sinus with mild mucosal thickening elsewhere. Other: Mastoids are clear. Grossly negative internal auditory structures. Scalp and face soft tissues appear negative. IMPRESSION: 1. Two tiny acute cortical infarcts in the right hemisphere: Posterior right middle frontal gyrus and posterior right parietal lobe. No hemorrhagic transformation or associated mass effect. 2. Small chronic cerebellar infarcts, most on the left. 3. Mild paranasal sinus sinus inflammation. Electronically Signed   By: Genevie Ann M.D.   On: 07/29/2019 09:25     EEG adult Result Date: 07/29/2019 Lora Havens, MD     07/29/2019  2:18 PM Patient Name: Tara [name] MRN: [id number] Epilepsy Attending: Lora Havens Referring Physician/Provider: Burnetta Sabin, NP Date: 07/29/2019 Duration: 23.46 minutes Patient history: 60 old female with left-sided  weakness and dysphagia.  EEG evaluate for seizures. Level of alertness: Awake AEDs during EEG study: None Technical aspects: This EEG study was done  with scalp electrodes positioned according to the 10-20 International system of electrode placement. Electrical activity was acquired at a sampling rate of 500Hz  and reviewed with a high frequency filter of 70Hz  and a low frequency filter of 1Hz . EEG data were recorded continuously and digitally stored. Description: The posterior dominant rhythm consists of 10-11 Hz activity of moderate voltage (25-35 uV) seen predominantly in posterior head regions, symmetric and reactive to eye opening and eye closing.  Physiologic photic driving was seen during photic stimulation.  Hyperventilation was not performed.       IMPRESSION: This study is within normal limits. No seizures or epileptiform discharges were seen throughout the recording. Priyanka O Yadav     VAS LOWER EXTREMITY VENOUS (DVT) Result Date: 07/29/2019  Lower Venous DVTStudy Indications: Stroke.  Risk Factors: None identified. Comparison Study: No prior studies. Performing Technologist: RVT  Examination Guidelines: A complete evaluation includes B-mode imaging, spectral Doppler, color Doppler, and power Doppler as needed of all accessible portions of each vessel. Bilateral testing is considered an integral part of a complete examination. Limited examinations for reoccurring indications may be performed as noted. The reflux portion of the exam is performed with the patient in reverse Trendelenburg.   Summary: RIGHT: - There is no evidence of deep vein thrombosis in the lower extremity.  - No cystic structure found in the popliteal fossa.  LEFT: - There is no evidence of deep vein thrombosis in the lower extremity.  - No cystic structure found in the popliteal fossa.  *See table(s) above for measurements and observations. Electronically signed by [place] MD on 07/29/2019 at 4:43:28  PM.    Final     12-lead ECG SR All prior EKG's in EPIC reviewed with no documented atrial fibrillation  Telemetry SR  Assessment and Plan:  1. Cryptogenic stroke The patient presents with cryptogenic stroke.  The patient has a TEE planned for this PM.  She is a working Chanda Busing, we spoke at length about rational for monitoring for afib with either a 30 day event monitor or an implantable loop recorder.  Risks, benefits, and alteratives to implantable loop recorder were discussed with the patient today.   At this time, the patient is very clear in her decision to proceed with implantable loop recorder.   Wound care was reviewed with the patient (keep incision clean and dry for 3 days).  Wound check will be scheduled for the patient   Please call with questions.   Renee Waverly Ferrari, PA-C 07/30/2019  EP Attending  Patient seen and examined. Agree with the findings as noted above. The patient presents with a cryptogenic stroke. I have discussed the treatment options including the risks/benefits/goals/expectations of ILR insertion and she wishes to proceed if her TEE is negative.  Engineer, civil (consulting).D.

## 2019-07-30 NOTE — Plan of Care (Signed)
  Problem: Education: Goal: Knowledge of disease or condition will improve Outcome: Adequate for Discharge Goal: Knowledge of secondary prevention will improve Outcome: Adequate for Discharge Goal: Knowledge of patient specific risk factors addressed and post discharge goals established will improve Outcome: Adequate for Discharge Goal: Individualized Educational Video(s) Outcome: Adequate for Discharge   Problem: Coping: Goal: Will verbalize positive feelings about self Outcome: Adequate for Discharge   Problem: Health Behavior/Discharge Planning: Goal: Ability to manage health-related needs will improve Outcome: Adequate for Discharge   Problem: Self-Care: Goal: Ability to participate in self-care as condition permits will improve Outcome: Adequate for Discharge Goal: Verbalization of feelings and concerns over difficulty with self-care will improve Outcome: Adequate for Discharge Goal: Ability to communicate needs accurately will improve Outcome: Adequate for Discharge   Problem: Nutrition: Goal: Risk of aspiration will decrease Outcome: Adequate for Discharge Goal: Dietary intake will improve Outcome: Adequate for Discharge   Problem: Intracerebral Hemorrhage Tissue Perfusion: Goal: Complications of Intracerebral Hemorrhage will be minimized Outcome: Adequate for Discharge   Problem: Ischemic Stroke/TIA Tissue Perfusion: Goal: Complications of ischemic stroke/TIA will be minimized Outcome: Adequate for Discharge   Problem: Spontaneous Subarachnoid Hemorrhage Tissue Perfusion: Goal: Complications of Spontaneous Subarachnoid Hemorrhage will be minimized Outcome: Adequate for Discharge

## 2019-07-30 NOTE — Progress Notes (Signed)
PT Cancellation Note  Patient Details Name: Tara [name] MRN: [id number] DOB: 09-Feb-1966   Cancelled Treatment:    Reason Eval/Treat Not Completed: (P) Patient at procedure or test/unavailable(Pt off unit for TEE and loop recorder placement.  Will f/u per POC.)   Arlenne Kimbley Artis Delay 07/30/2019, 11:59 AM  Bonney Leitz , PTA Acute Rehabilitation Services Pager [phone] Office [phone]

## 2019-07-30 NOTE — Progress Notes (Signed)
TCD with [facility] has been completed. Preliminary results can be found in CV Proc through chart review.   07/30/19 2:51 PM Olen Cordial RVT

## 2019-07-30 NOTE — Interval H&P Note (Signed)
History and Physical Interval Note:  07/30/2019 11:56 AM  Tara [name]  has presented today for surgery, with the diagnosis of STROKE.  The various methods of treatment have been discussed with the patient and family. After consideration of risks, benefits and other options for treatment, the patient has consented to  Procedure(s): TRANSESOPHAGEAL ECHOCARDIOGRAM (TEE) (N/A) as a surgical intervention.  The patient's history has been reviewed, patient examined, no change in status, stable for surgery.  I have reviewed the patient's chart and labs.  Questions were answered to the patient's satisfaction.     [name]

## 2019-07-30 NOTE — Interval H&P Note (Signed)
History and Physical Interval Note:  07/30/2019 12:36 PM  Tara [name]  has presented today for surgery, with the diagnosis of stroke.  The various methods of treatment have been discussed with the patient and family. After consideration of risks, benefits and other options for treatment, the patient has consented to  Procedure(s): LOOP RECORDER INSERTION (N/A) as a surgical intervention.  The patient's history has been reviewed, patient examined, no change in status, stable for surgery.  I have reviewed the patient's chart and labs.  Questions were answered to the patient's satisfaction.     Lewayne Bunting

## 2019-07-30 NOTE — Transfer of Care (Signed)
Immediate Anesthesia Transfer of Care Note  Patient: Tara [name]  Procedure(s) Performed: TRANSESOPHAGEAL ECHOCARDIOGRAM (TEE) (N/A ) BUBBLE STUDY  Patient Location: endo suite  Anesthesia Type:MAC  Level of Consciousness: drowsy and patient cooperative  Airway & Oxygen Therapy: Patient Spontanous Breathing and Patient connected to nasal cannula oxygen  Post-op Assessment: Report given to RN and Post -op Vital signs reviewed and stable  Post vital signs: Reviewed and stable  Last Vitals:  Vitals Value Taken Time  BP 104/62 07/30/19 1235  Temp    Pulse 94 07/30/19 1236  Resp 20 07/30/19 1236  SpO2 96 % 07/30/19 1236  Vitals shown include unvalidated device data.  Last Pain:  Vitals:   07/30/19 1146  TempSrc: Oral  PainSc:       Patients Stated Pain Goal: 0 (34/19/37 9024)  Complications: No apparent anesthesia complications

## 2019-07-30 NOTE — Discharge Summary (Addendum)
Physician Discharge Summary  Tara [name]:[id number] DOB: 24-Jun-1965 DOA: 07/29/2019  PCP: [name], MD  Admit date: 07/29/2019 Discharge date: 07/30/2019  Discharge disposition: Home   Recommendations for Outpatient Follow-Up:   Follow-up with neurologist, Dr. Delia Heady, in 6 weeks Follow-up with cardiologist for interpretation of loop recorder as scheduled Outpatient PT and OT   Discharge Diagnosis:   Principal Problem:   CVA (cerebral vascular accident) [facility]) Active Problems:   History of migraine headaches   HLD (hyperlipidemia)   Palpitations   Diastolic dysfunction   Dysphagia   Left hand weakness    Discharge Condition: Stable.  Diet recommendation: Low-salt diet  Code status: Full code.    Hospital Course:   [name] is a 54 y.o. female with medical history significant of hyperlipidemia, asthma, and migraine headaches.  She presented with complaints of left hand numbness and weakness.  Patient reported being in her normal state of health before going to bed around 10:50 PM on 07/28/2019.  When she woke up at around 3:15 in the morning (on 07/29/2019), she noted numbness and weakness in her left hand when trying to use the commode.  Her husband noted that she had been calling out to him, but he was unable to understand what she was saying.  She then tried to drink something, but was unable to.  She choked and water started running on the side of her mouth. She has noted over the last 3 months or so that her heart rate ranged from 80-114 and she is experience some intermittent palpitations.   While in the hospital, she complained of feeling "woozy".    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  2D echo showed EF estimated at 60 to 65% and grade 2 diastolic dysfunction.  TEE was performed by cardiologist and she was found to have a positive bubble study consistent with PFO.   Subsequently, loop recorder was implanted on 07/30/2019 by the cardiologist.  She was seen by PT and OT who recommended outpatient PT and OT.  Neurologist recommended dual antiplatelet therapy with aspirin and Plavix for total of 3 weeks followed by aspirin monotherapy.  She was previously on rosuvastatin prior to admission but this has been increased from 10 mg 3 times a week to 40 mg daily.  Discharge plan was discussed with neurologist, Dr. Pearlean Brownie.  From his standpoint, patient can be discharged home and he will see the patient in 6 weeks to discuss PFO closure.  Discharge plan was discussed with the patient and she verbalized understanding.    Discharge Exam:   Vitals:   07/30/19 1255 07/30/19 1305  BP: 105/78 113/68  Pulse: 75 78  Resp: 20 (!) 21  Temp:    SpO2: 99% 98%   Vitals:   07/30/19 1235 07/30/19 1245 07/30/19 1255 07/30/19 1305  BP: 104/62 100/72 105/78 113/68  Pulse: 98 77 75 78  Resp: 17 18 20  (!) 21  Temp: 97.7 F (36.5 C)     TempSrc: Temporal     SpO2: 96% 98% 99% 98%  Weight:      Height:         GEN: NAD SKIN: No rash EYES: EOMI ENT: MMM CV: RRR PULM: CTA B ABD: soft, ND, NT, +BS CNS: AAO x 3, non focal EXT: No edema or tenderness   The results of significant diagnostics from this hospitalization (including imaging, microbiology, ancillary  and laboratory) are listed below for reference.     Procedures and Diagnostic Studies:   CT Code Stroke CTA Head W/WO contrast  Result Date: 07/29/2019 CLINICAL DATA:  Left-sided weakness. Aphasia. Symptoms are improving. Abnormal CT of the head. EXAM: CT ANGIOGRAPHY HEAD AND NECK CT PERFUSION BRAIN TECHNIQUE: Multidetector CT imaging of the head and neck was performed using the standard protocol during bolus administration of intravenous contrast. Multiplanar CT image reconstructions and MIPs were obtained to evaluate the vascular anatomy. Carotid stenosis measurements (when applicable) are obtained utilizing NASCET  criteria, using the distal internal carotid diameter as the denominator. Multiphase CT imaging of the brain was performed following IV bolus contrast injection. Subsequent parametric perfusion maps were calculated using RAPID software. CONTRAST:  OMNIPAQUE IOHEXOL 350 MG/ML SOLN COMPARISON:  None. FINDINGS: CTA NECK FINDINGS Aortic arch: A 3 vessel arch configuration is present. No significant atherosclerotic changes present. No aneurysm or stenosis is present. Right carotid system: The right common carotid artery is within normal limits. The bifurcation is unremarkable. Cervical right ICA is normal. Left carotid system: Mild tortuosity is present in the proximal left common carotid artery without significant stenosis. The bifurcation is within normal limits. Cervical left ICA is normal. Vertebral arteries: Both vertebral arteries originate from the subclavian arteries. The left vertebral artery is slightly larger. No significant stenosis is present in either vertebral artery in the neck. Skeleton: Some straightening of the normal cervical lordosis is noted. No significant listhesis is present. Vertebral body heights and alignment are normal. Other neck: The soft tissues of the neck are otherwise normal. Upper chest: Mild ground-glass attenuation is present at the lung apices bilaterally, likely reflecting atelectasis or edema. No significant airspace consolidation is present. Thoracic inlet is normal. Review of the MIP images confirms the above findings CTA HEAD FINDINGS Anterior circulation: Minimal atherosclerotic changes are present within the cavernous internal carotid arteries bilaterally. No significant stenosis is present through the ICA termini. Mild irregularity is present in the left A1 segment without significant stenosis. MCA bifurcations are intact. There is some irregularity of distal ACA and MCA branch vessels without a significant proximal stenosis or occlusion. Posterior circulation: The left  vertebral artery is dominant. PICA origins are visualized and normal. Vertebrobasilar junction is normal. The basilar artery is normal. Both posterior cerebral arteries originate from the basilar tip. Irregularity of distal PCA branch vessels is present without a significant proximal stenosis or occlusion. Venous sinuses: The dural sinuses are patent. The straight sinus deep cerebral veins are intact. Cortical veins are unremarkable. No vascular malformation present. Anatomic variants: None Review of the MIP images confirms the above findings CT Brain Perfusion Findings: ASPECTS: 10/10 CBF (<30%) Volume: 79mL Perfusion (Tmax>6.0s) volume: 58mL Mismatch Volume: 49mL Infarction Location:n/a IMPRESSION: 1. No emergent large vessel occlusion. 2. Normal CT perfusion of the head. 3. Mild distal small vessel irregularity in both the anterior and posterior circulation suggesting intracranial atherosclerotic change without a proximal stenosis, aneurysm or branch vessel occlusion. 4. No significant stenosis in the neck. The above was relayed via text pager to Dr. Ritta Slot on 07/29/2019 at 04:53 . Electronically Signed   By: Marin Roberts M.D.   On: 07/29/2019 05:00   DG Chest 2 View  Result Date: 07/29/2019 CLINICAL DATA:  TIA at 3 a.m. this morning EXAM: CHEST - 2 VIEW COMPARISON:  05/31/2015 FINDINGS: Normal heart size, mediastinal contours, and pulmonary vascularity. Lungs clear. No pleural effusion or pneumothorax. Bones unremarkable. IMPRESSION: No acute abnormalities. Electronically Signed  By: Ulyses SouthwardMark  Boles M.D.   On: 07/29/2019 09:00   CT Code Stroke CTA Neck W/WO contrast  Result Date: 07/29/2019 CLINICAL DATA:  Left-sided weakness. Aphasia. Symptoms are improving. Abnormal CT of the head. EXAM: CT ANGIOGRAPHY HEAD AND NECK CT PERFUSION BRAIN TECHNIQUE: Multidetector CT imaging of the head and neck was performed using the standard protocol during bolus administration of intravenous contrast.  Multiplanar CT image reconstructions and MIPs were obtained to evaluate the vascular anatomy. Carotid stenosis measurements (when applicable) are obtained utilizing NASCET criteria, using the distal internal carotid diameter as the denominator. Multiphase CT imaging of the brain was performed following IV bolus contrast injection. Subsequent parametric perfusion maps were calculated using RAPID software. CONTRAST:  100mL OMNIPAQUE IOHEXOL 350 MG/ML SOLN COMPARISON:  None. FINDINGS: CTA NECK FINDINGS Aortic arch: A 3 vessel arch configuration is present. No significant atherosclerotic changes present. No aneurysm or stenosis is present. Right carotid system: The right common carotid artery is within normal limits. The bifurcation is unremarkable. Cervical right ICA is normal. Left carotid system: Mild tortuosity is present in the proximal left common carotid artery without significant stenosis. The bifurcation is within normal limits. Cervical left ICA is normal. Vertebral arteries: Both vertebral arteries originate from the subclavian arteries. The left vertebral artery is slightly larger. No significant stenosis is present in either vertebral artery in the neck. Skeleton: Some straightening of the normal cervical lordosis is noted. No significant listhesis is present. Vertebral body heights and alignment are normal. Other neck: The soft tissues of the neck are otherwise normal. Upper chest: Mild ground-glass attenuation is present at the lung apices bilaterally, likely reflecting atelectasis or edema. No significant airspace consolidation is present. Thoracic inlet is normal. Review of the MIP images confirms the above findings CTA HEAD FINDINGS Anterior circulation: Minimal atherosclerotic changes are present within the cavernous internal carotid arteries bilaterally. No significant stenosis is present through the ICA termini. Mild irregularity is present in the left A1 segment without significant stenosis. MCA  bifurcations are intact. There is some irregularity of distal ACA and MCA branch vessels without a significant proximal stenosis or occlusion. Posterior circulation: The left vertebral artery is dominant. PICA origins are visualized and normal. Vertebrobasilar junction is normal. The basilar artery is normal. Both posterior cerebral arteries originate from the basilar tip. Irregularity of distal PCA branch vessels is present without a significant proximal stenosis or occlusion. Venous sinuses: The dural sinuses are patent. The straight sinus deep cerebral veins are intact. Cortical veins are unremarkable. No vascular malformation present. Anatomic variants: None Review of the MIP images confirms the above findings CT Brain Perfusion Findings: ASPECTS: 10/10 CBF (<30%) Volume: 0mL Perfusion (Tmax>6.0s) volume: 0mL Mismatch Volume: 0mL Infarction Location:n/a IMPRESSION: 1. No emergent large vessel occlusion. 2. Normal CT perfusion of the head. 3. Mild distal small vessel irregularity in both the anterior and posterior circulation suggesting intracranial atherosclerotic change without a proximal stenosis, aneurysm or branch vessel occlusion. 4. No significant stenosis in the neck. The above was relayed via text pager to Dr. Ritta SlotMCNEILL KIRKPATRICK on 07/29/2019 at 04:53 . Electronically Signed   By: Marin Robertshristopher  Mattern M.D.   On: 07/29/2019 05:00   MR BRAIN WO CONTRAST  Result Date: 07/29/2019 CLINICAL DATA:  54 year old female code stroke presentation this morning left side weakness, aphasia. EXAM: MRI HEAD WITHOUT CONTRAST TECHNIQUE: Multiplanar, multiecho pulse sequences of the brain and surrounding structures were obtained without intravenous contrast. COMPARISON:  Plain CT head, CTA and CTP earlier today. FINDINGS: Brain:  There are 2 tiny foci of diffusion restriction in the right hemisphere. A small cortical focus at the right middle frontal gyrus is noted on series 5, image 84. There is a small right parietal  cortical or subcortical focus on image 80. No other restricted diffusion. There are several small chronic infarcts in the cerebellum, more so the left. No midline shift, mass effect, evidence of mass lesion, ventriculomegaly, extra-axial collection or acute intracranial hemorrhage. Cervicomedullary junction and pituitary are within normal limits. No chronic cortical encephalomalacia identified. There is a small focus of chronic microhemorrhage, or perhaps a very distal vascular thrombus in the right parietal lobe near the DWI abnormality on series 14, image 36. Deep gray nuclei and brainstem are negative. Vascular: Major intracranial vascular flow voids are preserved. Skull and upper cervical spine: Negative. Sinuses/Orbits: Negative orbits. Small fluid level in the right maxillary sinus with mild mucosal thickening elsewhere. Other: Mastoids are clear. Grossly negative internal auditory structures. Scalp and face soft tissues appear negative. IMPRESSION: 1. Two tiny acute cortical infarcts in the right hemisphere: Posterior right middle frontal gyrus and posterior right parietal lobe. No hemorrhagic transformation or associated mass effect. 2. Small chronic cerebellar infarcts, most on the left. 3. Mild paranasal sinus sinus inflammation. Electronically Signed   By: Odessa Fleming M.D.   On: 07/29/2019 09:25   EP PPM/ICD IMPLANT  Result Date: 07/30/2019 CONCLUSIONS:  1. Successful implantation of a [employer] Reveal LINQ implantable loop recorder for cryptogenic stroke  2. No early apparent complications. Lewayne Bunting, MD 07/30/2019 1:11 PM   CT Code Stroke Cerebral Perfusion with contrast  Result Date: 07/29/2019 CLINICAL DATA:  Left-sided weakness. Aphasia. Symptoms are improving. Abnormal CT of the head. EXAM: CT ANGIOGRAPHY HEAD AND NECK CT PERFUSION BRAIN TECHNIQUE: Multidetector CT imaging of the head and neck was performed using the standard protocol during bolus administration of intravenous contrast.  Multiplanar CT image reconstructions and MIPs were obtained to evaluate the vascular anatomy. Carotid stenosis measurements (when applicable) are obtained utilizing NASCET criteria, using the distal internal carotid diameter as the denominator. Multiphase CT imaging of the brain was performed following IV bolus contrast injection. Subsequent parametric perfusion maps were calculated using RAPID software. CONTRAST:  OMNIPAQUE IOHEXOL 350 MG/ML SOLN COMPARISON:  None. FINDINGS: CTA NECK FINDINGS Aortic arch: A 3 vessel arch configuration is present. No significant atherosclerotic changes present. No aneurysm or stenosis is present. Right carotid system: The right common carotid artery is within normal limits. The bifurcation is unremarkable. Cervical right ICA is normal. Left carotid system: Mild tortuosity is present in the proximal left common carotid artery without significant stenosis. The bifurcation is within normal limits. Cervical left ICA is normal. Vertebral arteries: Both vertebral arteries originate from the subclavian arteries. The left vertebral artery is slightly larger. No significant stenosis is present in either vertebral artery in the neck. Skeleton: Some straightening of the normal cervical lordosis is noted. No significant listhesis is present. Vertebral body heights and alignment are normal. Other neck: The soft tissues of the neck are otherwise normal. Upper chest: Mild ground-glass attenuation is present at the lung apices bilaterally, likely reflecting atelectasis or edema. No significant airspace consolidation is present. Thoracic inlet is normal. Review of the MIP images confirms the above findings CTA HEAD FINDINGS Anterior circulation: Minimal atherosclerotic changes are present within the cavernous internal carotid arteries bilaterally. No significant stenosis is present through the ICA termini. Mild irregularity is present in the left A1 segment without significant stenosis. MCA  bifurcations  are intact. There is some irregularity of distal ACA and MCA branch vessels without a significant proximal stenosis or occlusion. Posterior circulation: The left vertebral artery is dominant. PICA origins are visualized and normal. Vertebrobasilar junction is normal. The basilar artery is normal. Both posterior cerebral arteries originate from the basilar tip. Irregularity of distal PCA branch vessels is present without a significant proximal stenosis or occlusion. Venous sinuses: The dural sinuses are patent. The straight sinus deep cerebral veins are intact. Cortical veins are unremarkable. No vascular malformation present. Anatomic variants: None Review of the MIP images confirms the above findings CT Brain Perfusion Findings: ASPECTS: 10/10 CBF (<30%) Volume: 0mL Perfusion (Tmax>6.0s) volume: 0mL Mismatch Volume: 0mL Infarction Location:n/a IMPRESSION: 1. No emergent large vessel occlusion. 2. Normal CT perfusion of the head. 3. Mild distal small vessel irregularity in both the anterior and posterior circulation suggesting intracranial atherosclerotic change without a proximal stenosis, aneurysm or branch vessel occlusion. 4. No significant stenosis in the neck. The above was relayed via text pager to Dr. Ritta Slot on 07/29/2019 at 04:53 . Electronically Signed   By: Marin Roberts M.D.   On: 07/29/2019 05:00   EEG adult  Result Date: 07/29/2019 Charlsie Quest, MD     07/29/2019  2:18 PM Patient Name: ZANI KYLLONEN MRN: [id number] Epilepsy Attending: Charlsie Quest Referring Physician/Provider: Annie Main, NP Date: 07/29/2019 Duration: 23.46 minutes Patient history: 87 old female with left-sided weakness and dysphagia.  EEG evaluate for seizures. Level of alertness: Awake AEDs during EEG study: None Technical aspects: This EEG study was done with scalp electrodes positioned according to the 10-20 International system of electrode placement. Electrical activity was acquired at a  sampling rate of 500Hz  and reviewed with a high frequency filter of 70Hz  and a low frequency filter of 1Hz . EEG data were recorded continuously and digitally stored. Description: The posterior dominant rhythm consists of 10-11 Hz activity of moderate voltage (25-35 uV) seen predominantly in posterior head regions, symmetric and reactive to eye opening and eye closing.  Physiologic photic driving was seen during photic stimulation.  Hyperventilation was not performed.       IMPRESSION: This study is within normal limits. No seizures or epileptiform discharges were seen throughout the recording. Priyanka O Yadav   VAS [place] TRANSCRANIAL DOPPLER W BUBBLES  Result Date: 07/30/2019  Transcranial Doppler with Bubble Indications: Stroke. Performing Technologist: Chanda Busing RVT  Examination Guidelines: A complete evaluation includes B-mode imaging, spectral Doppler, color Doppler, and power Doppler as needed of all accessible portions of each vessel. Bilateral testing is considered an integral part of a complete examination. Limited examinations for reoccurring indications may be performed as noted.  Summary:  A vascular evaluation was performed. The right middle cerebral artery was studied. An IV was inserted into the patient's right Cephalic vein. Verbal informed consent was obtained.  Some HITS (high intensity transient signals) were heard at rest suggestive of a small PFO. With the Valsalva maneuver, a frank curtain sign was noted, which suggests a medium to large PFO. Positive TCD Bubble study indicative of a large right to left shunt. *See table(s) above for TCD measurements and observations.    Preliminary    ECHOCARDIOGRAM COMPLETE  Result Date: 07/29/2019    ECHOCARDIOGRAM REPORT   Patient Name:   LUNABELLE OATLEY Date of Exam: 07/29/2019 Medical Rec #:  [id number]       Height:       64.0 in Accession #:    [id number]  Weight:       168.7 lb Date of Birth:  May 22, 1966      BSA:          1.820 m Patient  Age:    53 years        BP:           118/89 mmHg Patient Gender: F               HR:           88 bpm. Exam Location:  Inpatient Procedure: 2D Echo, Cardiac Doppler and Color Doppler Indications:    TIA 435.9 / G45.9  History:        Patient has prior history of Echocardiogram examinations, most                 recent 07/06/2015. Risk Factors:Dyslipidemia. H/o CVA. Diastolic                 dysfunction.  Sonographer:    Ross Ludwig RDCS (AE) Referring Phys: [id number] RONDELL A SMITH IMPRESSIONS  1. Left ventricular ejection fraction, by estimation, is 60 to 65%. The left ventricle has normal function. The left ventricle has no regional wall motion abnormalities. There is mild concentric left ventricular hypertrophy. Left ventricular diastolic parameters were normal.  2. Right ventricular systolic function is normal. The right ventricular size is normal.  3. The mitral valve is normal in structure and function. No evidence of mitral valve regurgitation. No evidence of mitral stenosis.  4. The aortic valve is normal in structure and function. Aortic valve regurgitation is not visualized. No aortic stenosis is present.  5. The inferior vena cava is normal in size with greater than 50% respiratory variability, suggesting right atrial pressure of 3 mmHg. Comparison(s): Prior images unable to be directly viewed, comparison made by report only. FINDINGS  Left Ventricle: Left ventricular ejection fraction, by estimation, is 60 to 65%. The left ventricle has normal function. The left ventricle has no regional wall motion abnormalities. The left ventricular internal cavity size was normal in size. There is  mild concentric left ventricular hypertrophy. Left ventricular diastolic parameters were normal. Normal left ventricular filling pressure. Right Ventricle: The right ventricular size is normal. No increase in right ventricular wall thickness. Right ventricular systolic function is normal. Left Atrium: Left atrial size was  normal in size. Right Atrium: Right atrial size was normal in size. Pericardium: There is no evidence of pericardial effusion. Mitral Valve: The mitral valve is normal in structure and function. Normal mobility of the mitral valve leaflets. No evidence of mitral valve regurgitation. No evidence of mitral valve stenosis. Tricuspid Valve: The tricuspid valve is normal in structure. Tricuspid valve regurgitation is not demonstrated. No evidence of tricuspid stenosis. Aortic Valve: The aortic valve is normal in structure and function. Aortic valve regurgitation is not visualized. No aortic stenosis is present. Aortic valve mean gradient measures 2.0 mmHg. Aortic valve peak gradient measures 4.4 mmHg. Aortic valve area, by VTI measures 2.67 cm. Pulmonic Valve: The pulmonic valve was normal in structure. Pulmonic valve regurgitation is not visualized. No evidence of pulmonic stenosis. Aorta: The aortic root is normal in size and structure. Venous: The inferior vena cava is normal in size with greater than 50% respiratory variability, suggesting right atrial pressure of 3 mmHg. IAS/Shunts: No atrial level shunt detected by color flow Doppler.  LEFT VENTRICLE PLAX 2D LVIDd:         4.00 cm  Diastology LVIDs:  2.60 cm  LV e' lateral:   9.57 cm/s LV PW:         1.40 cm  LV E/e' lateral: 5.7 LV IVS:        1.30 cm  LV e' medial:    7.07 cm/s LVOT diam:     2.00 cm  LV E/e' medial:  7.7 LV SV:         48 LV SV Index:   27 LVOT Area:     3.14 cm  RIGHT VENTRICLE             IVC RV Basal diam:  2.90 cm     IVC diam: 1.20 cm RV S prime:     12.60 cm/s TAPSE (M-mode): 1.7 cm LEFT ATRIUM           Index       RIGHT ATRIUM           Index LA diam:      3.50 cm 1.92 cm/m  RA Area:     10.70 cm LA Vol (A2C): 29.3 ml 16.10 ml/m RA Volume:   24.00 ml  13.19 ml/m LA Vol (A4C): 29.0 ml 15.94 ml/m  AORTIC VALVE AV Area (Vmax):    2.78 cm AV Area (Vmean):   2.64 cm AV Area (VTI):     2.67 cm AV Vmax:           105.00 cm/s AV  Vmean:          74.900 cm/s AV VTI:            0.181 m AV Peak Grad:      4.4 mmHg AV Mean Grad:      2.0 mmHg LVOT Vmax:         92.90 cm/s LVOT Vmean:        62.900 cm/s LVOT VTI:          0.154 m LVOT/AV VTI ratio: 0.85  AORTA Ao Root diam: 2.80 cm MITRAL VALVE MV Area (PHT): 3.46 cm    SHUNTS MV Decel Time: 219 msec    Systemic VTI:  0.15 m MV E velocity: 54.40 cm/s  Systemic Diam: 2.00 cm MV A velocity: 53.30 cm/s MV E/A ratio:  1.02 Mihai Croitoru MD Electronically signed by Thurmon FairMihai Croitoru MD Signature Date/Time: 07/29/2019/5:09:55 PM    Final    ECHO TEE  Result Date: 07/30/2019    TRANSESOPHOGEAL ECHO REPORT   Patient Name:   Leonie DouglasLORRAINE A Nehring Date of Exam: 07/30/2019 Medical Rec #:  [id number]       Height:       64.0 in Accession #:    [id number]      Weight:       168.7 lb Date of Birth:  1966/04/16      BSA:          1.820 m Patient Age:    53 years        BP:           104/62 mmHg Patient Gender: F               HR:           91 bpm. Exam Location:  Inpatient Procedure: 2D Echo Indications:     Stroke  History:         Patient has prior history of Echocardiogram examinations, most                  recent 07/29/2019. Risk  Factors:Dyslipidemia.  Sonographer:     Leeroy Bock Turrentine Referring Phys:  [id number] Manson Passey Diagnosing Phys: Epifanio Lesches MD PROCEDURE: The transesophogeal probe was passed without difficulty through the esophogus of the patient. Sedation performed by different physician. The patient developed no complications during the procedure. IMPRESSIONS  1. Left ventricular ejection fraction, by estimation, is 60 to 65%. The left ventricle has normal function. The left ventricle has no regional wall motion abnormalities.  2. Right ventricular systolic function is normal. The right ventricular size is normal.  3. No left atrial/left atrial appendage thrombus was detected.  4. The mitral valve is normal in structure and function. Trivial mitral valve regurgitation.  5. The aortic  valve is tricuspid. Aortic valve regurgitation is not visualized.  6. Positive bubble study. Agitated saline contrast bubble study was positive with shunting observed within 3-6 cardiac cycles consistent with PFO FINDINGS  Left Ventricle: Left ventricular ejection fraction, by estimation, is 60 to 65%. The left ventricle has normal function. The left ventricle has no regional wall motion abnormalities. The left ventricular internal cavity size was normal in size. There is  no left ventricular hypertrophy. Right Ventricle: The right ventricular size is normal. Right vetricular wall thickness was not assessed. Right ventricular systolic function is normal. Left Atrium: Left atrial size was normal in size. No left atrial/left atrial appendage thrombus was detected. Right Atrium: Right atrial size was normal in size. Pericardium: There is no evidence of pericardial effusion. Mitral Valve: The mitral valve is normal in structure and function. Trivial mitral valve regurgitation. Tricuspid Valve: The tricuspid valve is normal in structure. Tricuspid valve regurgitation is not demonstrated. Aortic Valve: The aortic valve is tricuspid. Aortic valve regurgitation is not visualized. Pulmonic Valve: The pulmonic valve was not well visualized. Pulmonic valve regurgitation is not visualized. Aorta: The aortic root is normal in size and structure. IAS/Shunts: Positive bubble study. Agitated saline contrast bubble study was positive with shunting observed within 3-6 cardiac cycles suggestive of interatrial shunt. Epifanio Lesches MD Electronically signed by Epifanio Lesches MD Signature Date/Time: 07/30/2019/3:13:06 PM    Final    CT HEAD CODE STROKE WO CONTRAST  Result Date: 07/29/2019 CLINICAL DATA:  Code stroke. Aphasia. Left-sided weakness. Difficulty swallowing. Last seen normal at 22:50 yesterday. EXAM: CT HEAD WITHOUT CONTRAST TECHNIQUE: Contiguous axial images were obtained from the base of the skull through the  vertex without intravenous contrast. COMPARISON:  None. FINDINGS: Brain: No acute infarct, hemorrhage, or mass lesion is present. Basal ganglia are intact. Insular ribbon is normal. No acute or focal cortical abnormality is present. White matter is within normal limits. The ventricles are of normal size. No significant extraaxial fluid collection is present. Vascular: Focal hyperdensity is present at the right MCA bifurcation. No significant vascular calcifications are present. No other focal vascular hyperdensity is present. Skull: Calvarium is intact. No focal lytic or blastic lesions are present. No significant extracranial soft tissue lesion is present. Sinuses/Orbits: Mild mucosal thickening is present in the right greater than left ethmoid air cells. A fluid level is present in the right maxillary sinus. The paranasal sinuses and mastoid air cells are otherwise clear. The globes and orbits are within normal limits. ASPECTS [facility] Stroke Program Early CT Score) - Ganglionic level infarction (caudate, lentiform nuclei, internal capsule, insula, M1-M3 cortex): 7/7 - Supraganglionic infarction (M4-M6 cortex): 3/3 Total score (0-10 with 10 being normal): 10/10 IMPRESSION: 1. Focal hyperdensity at the right MCA bifurcation concerning for acute thrombus. Recommend CTA of the head and neck  to exclude large vessel occlusion. 2. Normal CT appearance of the brain. 3. Right maxillary sinus disease. *ASPECTS is 10/10 Electronically Signed   By: San Morelle M.D.   On: 07/29/2019 04:17   VAS [place] LOWER EXTREMITY VENOUS (DVT)  Result Date: 07/29/2019  Lower Venous DVTStudy Indications: Stroke.  Risk Factors: None identified. Comparison Study: No prior studies. Performing Technologist: Oliver Hum RVT  Examination Guidelines: A complete evaluation includes B-mode imaging, spectral Doppler, color Doppler, and power Doppler as needed of all accessible portions of each vessel. Bilateral testing is considered an  integral part of a complete examination. Limited examinations for reoccurring indications may be performed as noted. The reflux portion of the exam is performed with the patient in reverse Trendelenburg.  +---------+---------------+---------+-----------+----------+--------------+ RIGHT    CompressibilityPhasicitySpontaneityPropertiesThrombus Aging +---------+---------------+---------+-----------+----------+--------------+ CFV      Full           Yes      Yes                                 +---------+---------------+---------+-----------+----------+--------------+ SFJ      Full                                                        +---------+---------------+---------+-----------+----------+--------------+ FV Prox  Full                                                        +---------+---------------+---------+-----------+----------+--------------+ FV Mid   Full                                                        +---------+---------------+---------+-----------+----------+--------------+ FV DistalFull                                                        +---------+---------------+---------+-----------+----------+--------------+ PFV      Full                                                        +---------+---------------+---------+-----------+----------+--------------+ POP      Full           Yes      Yes                                 +---------+---------------+---------+-----------+----------+--------------+ PTV      Full                                                        +---------+---------------+---------+-----------+----------+--------------+  PERO     Full                                                        +---------+---------------+---------+-----------+----------+--------------+   +---------+---------------+---------+-----------+----------+--------------+ LEFT     CompressibilityPhasicitySpontaneityPropertiesThrombus  Aging +---------+---------------+---------+-----------+----------+--------------+ CFV      Full           Yes      Yes                                 +---------+---------------+---------+-----------+----------+--------------+ SFJ      Full                                                        +---------+---------------+---------+-----------+----------+--------------+ FV Prox  Full                                                        +---------+---------------+---------+-----------+----------+--------------+ FV Mid   Full                                                        +---------+---------------+---------+-----------+----------+--------------+ FV DistalFull                                                        +---------+---------------+---------+-----------+----------+--------------+ PFV      Full                                                        +---------+---------------+---------+-----------+----------+--------------+ POP      Full           Yes      Yes                                 +---------+---------------+---------+-----------+----------+--------------+ PTV      Full                                                        +---------+---------------+---------+-----------+----------+--------------+ PERO     Full                                                        +---------+---------------+---------+-----------+----------+--------------+  Summary: RIGHT: - There is no evidence of deep vein thrombosis in the lower extremity.  - No cystic structure found in the popliteal fossa.  LEFT: - There is no evidence of deep vein thrombosis in the lower extremity.  - No cystic structure found in the popliteal fossa.  *See table(s) above for measurements and observations. Electronically signed by Waverly Ferrari MD on 07/29/2019 at 4:43:28 PM.    Final      Labs:   Basic Metabolic Panel: Recent Labs  Lab 07/29/19 0402  07/29/19 0402 07/29/19 0406 07/30/19 0639  NA 141  --  138 140  K 3.7   < > 3.7 4.0  CL 103  --  103 105  CO2  --   --  26 25  GLUCOSE 126*  --  134* 114*  BUN 16  --  15 14  CREATININE 0.90  --  0.88 0.84  CALCIUM  --   --  9.7 9.6   < > = values in this interval not displayed.   GFR Estimated Creatinine Clearance: 78.4 mL/min (by C-G formula based on SCr of 0.84 mg/dL). Liver Function Tests: Recent Labs  Lab 07/29/19 0406  AST 21  ALT 27  ALKPHOS 52  BILITOT 0.4  PROT 6.9  ALBUMIN 4.0   No results for input(s): LIPASE, AMYLASE in the last 168 hours. No results for input(s): AMMONIA in the last 168 hours. Coagulation profile Recent Labs  Lab 07/29/19 0406  INR 0.8    CBC: Recent Labs  Lab 07/29/19 0402 07/29/19 0406 07/30/19 0639  WBC  --  8.4 4.9  NEUTROABS  --  3.0 2.2  HGB 13.9 13.3 12.5  HCT 41.0 41.5 37.4  MCV  --  95.8 93.7  PLT  --  369 353   Cardiac Enzymes: No results for input(s): CKTOTAL, CKMB, CKMBINDEX, TROPONINI in the last 168 hours. BNP: Invalid input(s): POCBNP CBG: Recent Labs  Lab 07/29/19 0357  GLUCAP 130*   D-Dimer No results for input(s): DDIMER in the last 72 hours. Hgb A1c Recent Labs    07/29/19 1132  HGBA1C 5.3   Lipid Profile Recent Labs    07/29/19 1132  CHOL 241*  HDL 51  LDLCALC 151*  TRIG 193*  CHOLHDL 4.7   Thyroid function studies Recent Labs    07/30/19 0639  TSH 1.831   Anemia work up No results for input(s): VITAMINB12, FOLATE, FERRITIN, TIBC, IRON, RETICCTPCT in the last 72 hours. Microbiology Recent Results (from the past 240 hour(s))  SARS CORONAVIRUS 2 (TAT 6-24 HRS) Nasopharyngeal Nasopharyngeal Swab     Status: None   Collection Time: 07/29/19  5:14 AM   Specimen: Nasopharyngeal Swab  Result Value Ref Range Status   SARS Coronavirus 2 NEGATIVE NEGATIVE Final    Comment: (NOTE) SARS-CoV-2 target nucleic acids are NOT DETECTED. The SARS-CoV-2 RNA is generally detectable in upper and  lower respiratory specimens during the acute phase of infection. Negative results do not preclude SARS-CoV-2 infection, do not rule out co-infections with other pathogens, and should not be used as the sole basis for treatment or other patient management decisions. Negative results must be combined with clinical observations, patient history, and epidemiological information. The expected result is Negative. Fact Sheet for Patients: HairSlick.no Fact Sheet for Healthcare Providers: quierodirigir.com This test is not yet approved or cleared by the [place] FDA and  has been authorized for detection and/or diagnosis of SARS-CoV-2 by FDA under an Emergency Use Authorization (EUA). This EUA  will remain  in effect (meaning this test can be used) for the duration of the COVID-19 declaration under Section 56 4(b)(1) of the Act, 21 U.S.C. section 360bbb-3(b)(1), unless the authorization is terminated or revoked sooner. Performed at [facility], [address] [place], [place] [address]      Discharge Instructions:   Discharge Instructions    Ambulatory referral to Occupational Therapy   Complete by: As directed    Ambulatory referral to Physical Therapy   Complete by: As directed    Diet - low sodium heart healthy   Complete by: As directed    Increase activity slowly   Complete by: As directed      Allergies as of 07/30/2019      Reactions   Tape Rash      Medication List    TAKE these medications   aspirin 81 MG EC tablet Take 1 tablet (81 mg total) by mouth daily. Start taking on: July 31, 2019   cholecalciferol 1000 units tablet Commonly known as: VITAMIN D Take 1,000 Units by mouth daily.   clopidogrel 75 MG tablet Commonly known as: PLAVIX Take 1 tablet (75 mg total) by mouth daily. Start taking on: July 31, 2019   Fish Oil 500 MG Caps Natures made Fish oil pearls 500mg  BID What changed:   how  much to take  how to take this  when to take this   Maxalt 10 MG tablet Generic drug: rizatriptan Take 10 mg by mouth as needed for migraine (headache).   PROBIOTIC PO Take 1 capsule by mouth daily.   rosuvastatin 40 MG tablet Commonly known as: CRESTOR Take 1 tablet (40 mg total) by mouth daily. What changed:   medication strength  how much to take  when to take this      Follow-up Information    [facility] Office Follow up.   Specialty: Cardiology Why: 08/12/2019 @ 8:30AM, wound check visit Contact information: [address] [place] [address] [phone]       Out[facility] Follow up.   Specialty: Rehabilitation Why: The Outpatient rehab will contact you for the first appointment Contact information: [id number] [place] [address] [phone]           Time coordinating discharge: 32 minutes  Signed:  Lurene Shadow  [facility] 07/30/2019, 4:37 PM

## 2019-07-31 LAB — RPR: RPR Ser Ql: NONREACTIVE

## 2019-07-31 LAB — LUPUS ANTICOAGULANT PANEL
DRVVT: 30.3 s (ref 0.0–47.0)
PTT Lupus Anticoagulant: 31.8 s (ref 0.0–51.9)

## 2019-07-31 LAB — ANTINUCLEAR ANTIBODIES, IFA: ANA Ab, IFA: NEGATIVE

## 2019-08-01 ENCOUNTER — Other Ambulatory Visit: Payer: Self-pay | Admitting: *Deleted

## 2019-08-01 NOTE — Patient Outreach (Signed)
[facility]) Care Management  08/01/2019  Tara [name] July 28, 1965 [id number]   Transition of care telephone call  Referral received:07/30/19 Initial outreach:07/31/19 Insurance: [facility]   Initial unsuccessful telephone call to patient's preferred number in order to complete transition of care assessment; no answer, left HIPAA compliant voicemail message requesting return call.   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  She was discharged to home on 07/02/19 without the need for home health services or DME.     Plan This RNCM will route unsuccessful outreach letter with [facility] Care Management pamphlet and 24 hour Nurse Advice Line Magnet to [facility] Care Management clinical pool to be mailed to patient's home address. This RNCM will attempt another outreach within 4 business days.  Egbert Garibaldi, RN, BSN  [facility] Care Management,Care Management Coordinator  [phone]- Mobile [phone]- Toll Free Main Office

## 2019-08-02 LAB — CARDIOLIPIN ANTIBODIES, IGG, IGM, IGA
Anticardiolipin IgA: <9 APL U/mL (ref 0–11)
Anticardiolipin IgG: <9 GPL U/mL (ref 0–14)
Anticardiolipin IgM: <9 MPL U/mL (ref 0–12)

## 2019-08-04 ENCOUNTER — Other Ambulatory Visit: Payer: Self-pay | Admitting: *Deleted

## 2019-08-04 NOTE — Patient Outreach (Signed)
[facility]) Care Management  08/04/2019  Tara [name] [birth year] [id number]   Transition of care call  Referral received:07/30/19 Initial outreach:08/01/19 Insurance: UMR    Subjective: Received voicemail message from patient on 3/6, Saturday returning initial call placed to her on 3/5. She requested return call on Monday 08/04/19  Unsuccessful 2nd  telephone call to patient's preferred number in order to complete transition of care assessment; No answer able to leave HIPAA compliant message for return call.   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  She was discharged to home on 07/02/19 without the need for home health servicesor DME. Marland Kitchen  Plan Will plan 3rd call attempt to patient in the next 4 business days.    Tara Garibaldi, RN, BSN  [facility] Care Management,Care Management Coordinator  [phone]- Mobile [phone]- Toll Free Main Office

## 2019-08-05 ENCOUNTER — Other Ambulatory Visit: Payer: Self-pay | Admitting: *Deleted

## 2019-08-05 ENCOUNTER — Telehealth: Payer: Self-pay | Admitting: Neurology

## 2019-08-05 ENCOUNTER — Encounter: Payer: Self-pay | Admitting: *Deleted

## 2019-08-05 NOTE — Telephone Encounter (Signed)
Thank you. I called the patient back and let her know. She understood.

## 2019-08-05 NOTE — Telephone Encounter (Signed)
The patient is a nurse in the hospital and I know her she wants to see me but explained to her that Dr. Lowella Dandy can see her sooner as well as help her with her migraines.  She could see me later when I come back from vacation

## 2019-08-05 NOTE — Telephone Encounter (Signed)
Hey there. Pt is scheduled to follow up w/ Dr. Lucia Gaskins on the recent TIA. She saw Dr. Pearlean Brownie in the hospital, and has seen Dr. Lucia Gaskins in the past. We scheduled it with Dr. Lucia Gaskins as she had sooner availability, but she requested I check and make sure this would be alright with both of you?

## 2019-08-05 NOTE — Patient Outreach (Signed)
[facility]) Care Management  08/05/2019  Tara [name] [birth year] [id number]   Transition of care call   Referral received:07/30/19 Initial outreach:08/01/19 Insurance: [facility]    Subjective: Return call from patient after return call to her on yesterday.  2 HIPAA identifiers verified. Explained purpose of call and completed transition of care assessment.  Tara [name] reports feeling weak , wiped out and tiring easily. She discussed continued weakness of left fingers, left cheek no increase not as bad as during hospitalization.  She denies having weakness in lower extremities, reviewed stroke symptoms and action plan.  She reports tolerating diet, tucking her chin when swallowing as recommended by speech therapy at evaluation, she denies having difficulty. She states loop recorder site is unremarkable with steristrips in place. Patient spouse is assisting with her  recovery. Patient has post discharge referrals for out patient PT and OT she has not received call regarding arranging visits and is eager to begin services for recovery.  she questions if outpatient services available in [facility] closer to her home so she will not have travel to [employer]. She ask for assistance with follow up on outpatient therapy and arranging referral visit with neurology.  She discussed  ongoing health issues with hyperlipidemia and is enrolled in chronic condition management program.   She does have the hospital indemnity plan and has filed claim, she reports having contact information for filing FMLA with  [employer] . She  uses a [facility] outpatient pharmacy at [place].      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  She was discharged to home on 07/02/19 without the need for home health  servicesor DME.  Assessment:  Patient voices good understanding of all discharge instructions.  See transition of care flowsheet for assessment details.   Plan:  Reviewed hospital discharge diagnosis of stroke  and discharge treatment plan using hospital discharge instructions, assessing medication adherence, reviewing problems requiring provider notification, and discussing the importance of follow up with surgeon, primary care provider and/or specialists as directed. Reviewed [place] healthy lifestyle program information to receive discounted premium for  2022  Step 1: Get annual physical between May 29, 2018 and November 27, 2019; Step 2: Complete your health assessment between May 30, 2019 and January 28, 2020 at [email or web address] Step 3:Identify your current health status and complete the corresponding action step between January 1, and January 28, 2020.   Using Active Health Management ActiveAdvice View website, verified that patient is an active participate in [place]'s Active Health Management chronic disease management program.   .Placed call to Neuro rehab regarding patient referral for services of PT and OT outpatient , spoke with representative she explained  occupational therapy is usually offered in outpatient therapy centers except for neuro rehab. Representative agreeable to contacting patient to explain and schedule initial visit after end of this call.   Placed call to [facility] Neurology regarding patient follow up with Dr. Pearlean Brownie , Spoke with Clair Gulling she is unable to see referral in epic, reviewed with her MD discharge note recommends follow up and per patient conversation  with Dr. Pearlean Brownie for  6 week follow up,  she is able to view note.  She is agreeable to contacting patient to arrange appointment with neurology after this outreach.   Will follow up with patient on the next  business  day regarding coordination of after discharge follow up appointments  addressed on today. Patient has been routed [facility] care management letter at initial call , she has declined need for additional letter.    Joylene Draft, RN, BSN  [place] Management Coordinator  [phone]- Mobile [phone]- Toll Free Main Office

## 2019-08-05 NOTE — Telephone Encounter (Signed)
Left message on pt's VM requesting a call bk to schedule a follow up from the hospital

## 2019-08-06 ENCOUNTER — Other Ambulatory Visit: Payer: Self-pay | Admitting: *Deleted

## 2019-08-06 NOTE — Patient Outreach (Signed)
[facility]) Care Management  08/06/2019  Tara [name] November 27, 1965 [id number]  Transition of care follow up call and case closure   Referral received:07/30/19 Initial outreach:08/01/19 Insurance: [employer]    Subjective : Successful outreach call to patient to follow up on care coordination call from yesterday to Neuro rehab and post discharge neurology visits, Patient explains received returns calls from neurology to schedule, neuro PT and OT and follow up appointment with Tara [name] as Tara [name] not available for next few weeks Patient has initial therapy appointment on 08/07/19.her spouse will be able to provide transportation. . Patient discussed she  continues to tire easily , discussed pacing self , resting between activity as tolerated. She denies any new symptom concerns. Patient has been able to make contact [employer] for FMLA. Patient appreciative of follow assistance.    [name] in right parietal area , loop recorder placedComorbidities include: hyperlipidemia, migraines ,asthma, spondylolisthesis.  Shewas discharged to home on 2/3/21without the need for home health servicesor DME.   Assessment  Post discharge follow up appointments arranged.    Plan Will close case to [facility] care management services no care management needs assessed or identified by patient  at this time.    Tara Garibaldi, RN, BSN  [facility] Care Management,Care Management Coordinator  [phone]- Mobile [phone]- Toll Free Main Office

## 2019-08-07 ENCOUNTER — Other Ambulatory Visit: Payer: Self-pay

## 2019-08-07 ENCOUNTER — Ambulatory Visit: Payer: 59 | Admitting: Physical Therapy

## 2019-08-07 ENCOUNTER — Ambulatory Visit: Payer: 59 | Attending: Family Medicine | Admitting: Occupational Therapy

## 2019-08-07 DIAGNOSIS — R2681 Unsteadiness on feet: Secondary | ICD-10-CM | POA: Diagnosis present

## 2019-08-07 DIAGNOSIS — R2689 Other abnormalities of gait and mobility: Secondary | ICD-10-CM | POA: Diagnosis present

## 2019-08-07 DIAGNOSIS — M6281 Muscle weakness (generalized): Secondary | ICD-10-CM

## 2019-08-07 DIAGNOSIS — I69318 Other symptoms and signs involving cognitive functions following cerebral infarction: Secondary | ICD-10-CM | POA: Insufficient documentation

## 2019-08-07 DIAGNOSIS — R278 Other lack of coordination: Secondary | ICD-10-CM | POA: Insufficient documentation

## 2019-08-07 NOTE — Therapy (Signed)
[facility] [address] [place], [place], [address] Phone: [phone]   Fax:  [phone]  Occupational Therapy Evaluation  Patient Details  Name: Tara [name] MRN: [id number] Date of Birth: 04-23-1966 No data recorded  Encounter Date: 08/07/2019  OT End of Session - 08/07/19 1831    Visit Number  1    Number of Visits  13    Date for OT Re-Evaluation  09/26/19    Authorization Type  [facility]    OT Start Time  1530    OT Stop Time  1615    OT Time Calculation (min)  45 min    Activity Tolerance  Patient tolerated treatment well    Behavior During Therapy  [facility] for tasks assessed/performed       Past Medical History:  Diagnosis Date  . Asthma   . Hyperlipidemia   . Migraine   . Rotator cuff tear   . Spondylolisthesis    L4-5    Past Surgical History:  Procedure Laterality Date  . BUBBLE STUDY  07/30/2019   Procedure: BUBBLE STUDY;  Surgeon: [name], MD;  Location: [facility] ENDOSCOPY;  Service: Cardiovascular;;  . LOOP RECORDER INSERTION N/A 07/30/2019   Procedure: LOOP RECORDER INSERTION;  Surgeon: [name], MD;  Location: [facility] INVASIVE CV LAB;  Service: Cardiovascular;  Laterality: N/A;  . No prior surgery    . TEE WITHOUT CARDIOVERSION N/A 07/30/2019   Procedure: TRANSESOPHAGEAL ECHOCARDIOGRAM (TEE);  Surgeon: [name], MD;  Location: [facility] ENDOSCOPY;  Service: Cardiovascular;  Laterality: N/A;    There were no vitals filed for this visit.  Subjective Assessment - 08/07/19 1537    Subjective   Whenever I move my head, it makes me feel swimmy headed    Pertinent History  CVA 07/29/19. PMH: HLD, migraines, asthma, small chronic cerebellar infarcts    Limitations  fall risk, loop recorder, ? swallowing precautions (chin tuck)    Patient Stated Goals  typing, cooking, cleaning, driving, working    Currently in Pain?  Yes    Pain Score  3     Pain Location  Arm   elbows/forearms    Pain Orientation  Right;Left    Pain Descriptors / Indicators  Aching    Pain Type  Acute pain    Pain Onset  --   Rt side prior to stroke   Pain Frequency  Occasional    Aggravating Factors   unsure    Pain Relieving Factors  unsure        [facility] [place] [facility] OT Assessment - 08/07/19 1541      Assessment   Medical Diagnosis  CVA    Onset Date/Surgical Date  07/29/19    Hand Dominance  Right      Precautions   Precautions  Fall    Precaution Comments  LOOP recorder, Per patient repot:  take pills with pudding, chin tucks to swallow water      Balance Screen   Has the patient fallen in the past 6 months  --   see PT eval     Home  Environment   Bathroom Risk manager;Door    Additional Comments  Pt lives in 3 story home w/ bedroom on 2nd floor. 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   for [employer]   Leisure  Enjoyed walking for exercise, gardening      ADL   Eating/Feeding  Independent    Grooming  Independent    Upper Body Bathing  Modified independent    Lower Body Bathing  Modified independent    Upper Body Dressing  Increased time    Lower Body Dressing  Increased time    Lexicographer -  Hygiene  Independent    Tub/Shower Transfer  Modified independent    ADL comments  Pt has hand held shower and holds wall      IADL   Shopping  --   Has not yet attempted   Light Housekeeping  Does personal laundry completely    Meal Prep  Able to complete simple warm meal prep;Able to complete simple cold meal and snack prep   family bringing meals   Community Mobility  Relies on family or friends for transportation    Medication Management  Is responsible for taking medication in correct dosages at correct time      Mobility   Mobility Status   Independent   but has some dizziness     Written Expression   Dominant Hand  Right    Handwriting  100% legible      Vision - History   Baseline Vision  Wears glasses only for reading    Additional Comments  Lasix surgery      Vision Assessment   Ocular Range of Motion  Within Functional Limits    Tracking/Visual Pursuits  Able to track stimulus in all quads without difficulty    Convergence  Within functional limits    Comment  Pt reports swimmy feeling w/ head turns but vision appears intact      Cognition   Cognition Comments  slower processing noted, word finding difficulties      Observation/Other Assessments   Observations  ? Lt inattention - pt reports dropping items from Lt hand, mild motor planning deficits, very cautious and avoids head turns d/t dizzy feeling      Sensation   Light Touch  Appears Intact    Proprioception  Appears Intact      Coordination   9 Hole Peg Test  Right;Left    Right 9 Hole Peg Test  22.04 sec    Left 9 Hole Peg Test  24.19 sec      Edema   Edema  none      ROM / Strength   AROM / PROM / Strength  AROM;Strength      AROM   Overall AROM Comments  BUE AROM WFL's      Strength   Overall Strength Comments  LUE MMT grossly 3+ to 4/5. RUE MMT grossly 4+/5 due to old rotator cuff tear      Hand Function   Right Hand Grip (lbs)  56 lbs    Left Hand Grip (lbs)  35 lbs                        OT Short Term Goals - 08/07/19 1832      OT SHORT TERM GOAL #1   Title  Independent with HEP for LUE strengthening (shoulder and hand)    Time  3    Period  Weeks    Status  New      OT SHORT  TERM GOAL #2   Title  Grip strength Lt hand to be 45 lbs or greater    Baseline  35 lbs (Rt = 56)    Time  3    Period  Weeks    Status  New      OT SHORT TERM GOAL #3   Title  Typing goal TBD    Time  3    Period  Weeks    Status  New      OT SHORT TERM GOAL #4   Title  Pt to perform environmental scanning w/ head turns and  no LOB, min dizziness w/ 90% accuracy    Time  3    Period  Weeks    Status  New        OT Long Term Goals - 08/07/19 1834      OT LONG TERM GOAL #1   Title  Pt to report less drops Lt hand t/o day    Time  6    Period  Weeks    Status  New      OT LONG TERM GOAL #2   Title  Pt to perform cleaning tasks for 20 min w/o rest mod I level    Time  6    Period  Weeks    Status  New      OT LONG TERM GOAL #3   Title  Pt to perform full cooking tasks mod I level safely    Time  6    Period  Weeks    Status  New      OT LONG TERM GOAL #4   Title  Pt to simulate work tasks w/ extra time as required    Time  6    Period  Weeks    Status  New            Plan - 08/07/19 1836    Clinical Impression Statement  Pt is a 54 y.o. female who presents to OPOT s/p Rt CVA on 07/29/19 w/ mild residual Lt sided weakness. Pt also reports dizziness/motion feeling w/ head turns and unsteady on feet. Pt reports difficulty w/ typing as well. Pt does have some word finding difficulties and appears slower to process information    OT Occupational Profile and History  Problem Focused Assessment - Including review of records relating to presenting problem    Occupational performance deficits (Please refer to evaluation for details):  IADL's;Work;Leisure    Rehab Potential  Good    Clinical Decision Making  Limited treatment options, no task modification necessary    Comorbidities Affecting Occupational Performance:  May have comorbidities impacting occupational performance    Modification or Assistance to Complete Evaluation   No modification of tasks or assist necessary to complete eval    OT Frequency  2x / week    OT Duration  6 weeks   plus eval   OT Treatment/Interventions  Self-care/ADL training;Therapeutic exercise;Functional Mobility Training;Neuromuscular education;Manual Therapy;Therapeutic activities;Coping strategies training;DME and/or AE instruction;Cognitive  remediation/compensation;Visual/perceptual remediation/compensation;Moist Heat;Passive range of motion;Patient/family education    Plan  assess typing speed and set goal (STG #3), Issue putty HEP and if time Lt shoulder strengthening HEP    Recommended Other Services  will monitor for need for speech therapy services    Consulted and Agree with Plan of Care  Patient       Patient will benefit from skilled therapeutic intervention in order to improve the following deficits and impairments:  Visit Diagnosis: Muscle weakness (generalized)  Unsteadiness on feet  Other lack of coordination    Problem List Patient Active Problem List   Diagnosis Date Noted  . CVA (cerebral vascular accident) ([place]) 07/29/2019  . Dysphagia 07/29/2019  . Left hand weakness 07/29/2019  . Palpitations 09/15/2015  . Diastolic dysfunction 05/17/7587  . Renal cyst, left 06/19/2013  . History of migraine headaches 03/05/2013  . Complicated migraine 32/54/9826  . HLD (hyperlipidemia) 03/05/2013  . 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 Rotator cuff syndrome 03/05/2013  . Low back pain 03/05/2013    Carey Bullocks, OTR/L 08/07/2019, 6:39 PM  [facility] [address] [place], [place], [address] Phone: [phone]   Fax:  [phone]  Name: Tara [name] MRN: [id number] Date of Birth: 05/06/1966

## 2019-08-08 NOTE — Therapy (Signed)
[facility] [address] [place], [place], [address] Phone: [phone]   Fax:  [phone]  Physical Therapy Evaluation  Patient Details  Name: Tara [name] MRN: [id number] Date of Birth: [birth year] No data recorded  Encounter Date: 08/07/2019  PT End of Session - 08/08/19 1609    Visit Number  1    Number of Visits  13    Authorization Type  [facility]    PT Start Time  1448    PT Stop Time  1533    PT Time Calculation (min)  45 min    Activity Tolerance  Patient tolerated treatment well    Behavior During Therapy  [facility] for tasks assessed/performed   Guarded with gait      Past Medical History:  Diagnosis Date  . Asthma   . Hyperlipidemia   . Migraine   . Rotator cuff tear   . Spondylolisthesis    L4-5    Past Surgical History:  Procedure Laterality Date  . BUBBLE STUDY  07/30/2019   Procedure: BUBBLE STUDY;  Surgeon: [name], MD;  Location: [facility] ENDOSCOPY;  Service: Cardiovascular;;  . LOOP RECORDER INSERTION N/A 07/30/2019   Procedure: LOOP RECORDER INSERTION;  Surgeon: [name], MD;  Location: [facility] INVASIVE CV LAB;  Service: Cardiovascular;  Laterality: N/A;  . No prior surgery    . TEE WITHOUT CARDIOVERSION N/A 07/30/2019   Procedure: TRANSESOPHAGEAL ECHOCARDIOGRAM (TEE);  Surgeon: [name], MD;  Location: [facility] ENDOSCOPY;  Service: Cardiovascular;  Laterality: N/A;    There were no vitals filed for this visit.   Subjective Assessment - 08/07/19 1451    Subjective  Woke up early in the morning (07/29/2019) and I spoke to my husband (he couldn't understand me); went to use the bathroom and had difficulty swallowing water, wiping up with my L hand.  Had acute embolic CVA; is currently wearing loop recorder.  Still having trouble with fatigue in L hand with computer work.  Sometimes lightheadedness and unsteadiness upon standing.  No falls.    Patient Stated Goals  Pt's goals  for therapy are to strengthen to help with the tired feeling.    Currently in Pain?  Yes    Pain Score  3    RUE, LUE pain 2/10   Pain Location  Arm   forearm pain   Pain Orientation  Right;Left    Pain Descriptors / Indicators  Aching    Pain Type  Acute pain    Pain Frequency  Occasional    Aggravating Factors   unsure    Pain Relieving Factors  unsure         [facility] PT Assessment - 08/07/19 1503      Assessment   Medical Diagnosis  CVA    Referring Provider (PT)  --   Leodis Sias, MD is PCP   Onset Date/Surgical Date  07/30/19    Hand Dominance  Right      Precautions   Precautions  Fall    Precaution Comments  Loop Recorder; Per patient report:  take pills with pudding, chin tucks to swallow water      Balance Screen   Has the patient fallen in the past 6 months  No    Has the patient had a decrease in activity level because of a fear of falling?   No    Is the patient reluctant to leave their home because of a fear of falling?  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    Leisure  Enjoyed walking for exercise, gardening      Observation/Other Assessments   Focus on Therapeutic Outcomes (FOTO)   Staff did not capture (not set up)      Sensation   Light Touch  Appears Intact      ROM / Strength   AROM / PROM / Strength  Strength      Strength   Overall Strength  Deficits    Overall  Strength Comments  LLE 4/5 throughout; RLE 5/5      Transfers   Transfers  Sit to Stand;Stand to Sit    Sit to Stand  5: Supervision;Without upper extremity assist;From chair/3-in-1    Five time sit to stand comments   20.43    Stand to Sit  5: Supervision;Without upper extremity assist;To chair/3-in-1      Ambulation/Gait   Ambulation/Gait  Yes    Ambulation/Gait Assistance  5: Supervision    Ambulation Distance (Feet)  200 Feet    Assistive device  None    Gait Pattern  Step-through pattern;Decreased arm swing - right;Decreased arm swing - left;Decreased step length - left;Decreased step length - right;Decreased stride length   overall guarded gait pattern   Gait velocity  11.65 sec = 2.82 f/tsec      Standardized Balance Assessment   Standardized Balance Assessment  Dynamic Gait Index;Timed Up and Go Test      Dynamic Gait Index   Level Surface  Moderate Impairment    Change in Gait Speed  Mild Impairment    Gait with Horizontal Head Turns  Moderate Impairment    Gait with Vertical Head Turns  Moderate Impairment    Gait and Pivot Turn  Mild Impairment    Step Over Obstacle  Moderate Impairment    Step Around Obstacles  Mild Impairment    Steps  Mild Impairment    Total Score  12    DGI comment:  DGI score < 19/24 indicates increased fall risk      Timed Up and Go Test   TUG  Normal TUG    Normal TUG (seconds)  13.88    TUG Comments  Scores >13.5 sec indicate increased fall risk.                Objective measurements completed on examination: See above findings.              PT Education - 08/08/19 1602    Education Details  Discussed results of eval, POC; discussed pt's fatigue in relation to CVA; discussed avoiding overdoing activities, resting throughout the day.    Person(s) Educated  Patient    Methods  Explanation    Comprehension  Verbalized understanding  PT Short Term Goals - 08/08/19 1616      PT SHORT TERM GOAL #1   Title  Pt  will be independent with HEP for improved strength, balance, and gait.  TARGET for all STGs:  4 weeks:  09/05/2019    Time  4    Period  Weeks    Status  New      PT SHORT TERM GOAL #2   Title  Pt will improve 5x sit<>stand to less than or equal to 15 seconds for improved functional lower extremity strength.    Baseline  20.43 sec    Time  4    Period  Weeks      PT SHORT TERM GOAL #3   Title  Pt will improve DGI score to at least 16/24 for decresaed fall risk.    Time  4    Period  Weeks    Status  New      PT SHORT TERM GOAL #4   Title  Pt will verbalize understanding of:  fall prevention in home environment and CVA warning signs/symptoms    Time  4    Period  Weeks        PT Long Term Goals - 08/08/19 1618      PT LONG TERM GOAL #1   Title  Pt will be independent with progression of HEP to address strength, balance, gait.  TARGET 09/19/2019 (may be delayed due to scheduling)    Time  6    Period  Weeks    Status  New      PT LONG TERM GOAL #2   Title  Pt will improve 5x sit<>stand to less than or equal to 12 seconds for improved functional strength.    Time  6    Period  Weeks    Status  New      PT LONG TERM GOAL #3   Title  Pt will improve DGI score to at least 19/24 for decreased fall risk.    Time  6    Period  Weeks    Status  New      PT LONG TERM GOAL #4   Title  Pt will ambulate at least 1000 ft, indoor and outdoor surfaces, modified independently no LOB for return to independent community gait.    Time  6    Period  Weeks    Status  New             Plan - 08/08/19 1610    Clinical Impression Statement  Pt is a 54 year old female who presents to OPPT with history of Rt CVA on 07/29/19 w/ mild residual Lt sided weakness. Pt also reports dizziness/motion feeling w/ head turns and unsteady on feet.  She presents with decreased LLE strength, decreased functional lower extremity strength with transfers, decreased balance, decreased gait independence.  She  is at fall risk per DGI and TUG scores.  She demonstrates overall guarded gait pattern with decreased head/body movement, with c/o dizziness upon turns during DGI.  Will need to further assess vestibular component of balance.  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  She will benefit from skilled PT to address the above stated deficits to decrease fall risk and improve overall funcitonal mobility and independence.    Personal Factors and Comorbidities  Comorbidity 3+    Comorbidities  PMH:  asthma, hyperlipidemia, spondyloslisthesis, R RTC tear    Examination-Activity  Limitations  Locomotion Level;Transfers;Stand    Examination-Participation Restrictions  Community Activity;Yard Work;Other   walking for exercise in neighborhood, return to work   Stability/Clinical Decision Making  Evolving/Moderate complexity    Clinical Decision Making  Moderate    Rehab Potential  Good    PT Frequency  2x / week    PT Duration  6 weeks   plus eval week; total POC = 7 weeks   PT Treatment/Interventions  ADLs/Self Care Home Management;Gait training;Stair training;Functional mobility training;Therapeutic activities;Therapeutic exercise;Balance training;Neuromuscular re-education;DME Instruction;Patient/family education;Electrical Stimulation;Orthotic Fit/Training    PT Next Visit Plan  Initiate HEP for LLE strengthening (weightbearing/functional strengthening), balance; may need to assess vision/vestibular components of balance since pt so guarded with gait at eval    Recommended Other Services  Need to f/u with patient if she wants to pursue speech therapy-per OT eval-mild word finding difficulty    Consulted and Agree with Plan of Care  Patient       Patient will benefit from skilled therapeutic intervention in order to improve the following deficits and impairments:  Abnormal gait, Difficulty walking, Decreased endurance, Decreased balance, Decreased activity  tolerance, Decreased strength, Decreased mobility  Visit Diagnosis: Muscle weakness (generalized)  Unsteadiness on feet  Other abnormalities of gait and mobility     Problem List Patient Active Problem List   Diagnosis Date Noted  . CVA (cerebral vascular accident) ([facility]) 07/29/2019  . Dysphagia 07/29/2019  . Left hand weakness 07/29/2019  . Palpitations 09/15/2015  . Diastolic dysfunction 09/15/2015  . Renal cyst, left 06/19/2013  . History of migraine headaches 03/05/2013  . 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 HLD (hyperlipidemia) 03/05/2013  . 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 Rotator cuff syndrome 03/05/2013  . Low back pain 03/05/2013    Carols Clemence W. 08/08/2019, 4:21 PM Gean Maidens., PT  [facility] [address] [place], [place], [address] Phone: [phone]   Fax:  [phone]  Name: STUART GUILLEN MRN: [id number] Date of Birth: 09-28-1965

## 2019-08-11 DIAGNOSIS — Z0289 Encounter for other administrative examinations: Secondary | ICD-10-CM

## 2019-08-12 ENCOUNTER — Telehealth: Payer: Self-pay | Admitting: Neurology

## 2019-08-12 ENCOUNTER — Ambulatory Visit (INDEPENDENT_AMBULATORY_CARE_PROVIDER_SITE_OTHER): Payer: 59 | Admitting: *Deleted

## 2019-08-12 ENCOUNTER — Other Ambulatory Visit: Payer: Self-pay

## 2019-08-12 DIAGNOSIS — I639 Cerebral infarction, unspecified: Secondary | ICD-10-CM

## 2019-08-12 LAB — CUP PACEART INCLINIC DEVICE CHECK
Date Time Interrogation Session: 20210316093842
Implantable Pulse Generator Implant Date: 20210303

## 2019-08-12 NOTE — Telephone Encounter (Signed)
Spoke with pt. She accepted a sooner appt with [name] NP tomorrow 3/17 @ 1:15 pm. I offered for pt to either have the earlier appt or she can have PCP fill out her form and we can refund her money. Pt's questions were answered. She accepted the sooner appt. I canceled the April appt with Dr. Lucia Gaskins.

## 2019-08-12 NOTE — Telephone Encounter (Signed)
Pt called to check on the status of her FMLA paperwork informed of 7-10 business day turnaround for paperwork. Pt states she is needing the dates for her leave to march 21st

## 2019-08-12 NOTE — Progress Notes (Signed)
ILR wound check in clinic. Steri strips removed. Wound well healed. R-waves 0.64mV. Home monitor transmitting nightly. No episodes. Questions answered.

## 2019-08-12 NOTE — Patient Instructions (Signed)
Call office if you have any swelling, redness or drainage at the wound site. [phone]

## 2019-08-12 NOTE — Telephone Encounter (Signed)
Yeah, it looks like there's a note from Dr. Pearlean Brownie on 3/3 while she was in the hospital

## 2019-08-13 ENCOUNTER — Encounter: Payer: Self-pay | Admitting: Adult Health

## 2019-08-13 ENCOUNTER — Ambulatory Visit: Payer: 59 | Admitting: Adult Health

## 2019-08-13 VITALS — BP 118/88 | HR 75 | Temp 97.5°F | Ht 64.0 in | Wt 164.2 lb

## 2019-08-13 DIAGNOSIS — E785 Hyperlipidemia, unspecified: Secondary | ICD-10-CM | POA: Diagnosis not present

## 2019-08-13 DIAGNOSIS — I639 Cerebral infarction, unspecified: Secondary | ICD-10-CM | POA: Diagnosis not present

## 2019-08-13 DIAGNOSIS — Z95818 Presence of other cardiac implants and grafts: Secondary | ICD-10-CM | POA: Diagnosis not present

## 2019-08-13 DIAGNOSIS — Q211 Atrial septal defect: Secondary | ICD-10-CM

## 2019-08-13 DIAGNOSIS — Q2112 Patent foramen ovale: Secondary | ICD-10-CM

## 2019-08-13 NOTE — Progress Notes (Addendum)
[facility] [address] [place]. [place] [address] [phone]       HOSPITAL FOLLOW UP NOTE  [name] Date of Birth:  12-05-1965 Medical Record Number:  [id number]   Reason for Referral:  hospital stroke follow up    CHIEF COMPLAINT:  Chief Complaint  Patient presents with  . Hospitalization Follow-up    Rm 9, alone.  Seeing earlier for FMLA paperwork (at my desk now).  PT-OT neurorehab. Fatigue, and R hand shaking.   Works as Engineer, drilling for [employer].   . 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  History obtained from patient and chart review. Reviewed all radiology images and labs personally.  [name] is a 54 y.o. female with history of HLD, migraine and asthma who presented with HA since 2/28 who woke w/ LUE numbness and weakness, severe dysarthria and dysphagia on 07/29/2019.  Evaluated by stroke team and Dr. Pearlean Brownie with stroke work-up revealing 2 tiny right parietal infarcts embolic secondary to unknown source.  In addition to acute infarcts, MRI also showed several small chronic infarcts in the cerebellum but per review, [name] these were likely flow-voids and not old strokes.  TEE did not show evidence of PFO.  Recommended further evaluation with TCD which was suggestive of medium to large PFO.  Recommended further discussion outpatient regarding possible PFO closure.  Loop recorder placed to rule out atrial fibrillation as possible cause of stroke.  Hypercoagulable work-up negative.  Recommended DAPT for 3 weeks and aspirin alone.  LDL 151 and recommended increasing Crestor frequency to 40 mg daily.  No history of HTN or DM.  Other stroke risk factors include migraines but no prior history of stroke.  Discharged home in stable condition without therapy needs.  [name] is a 54 year old female who is being seen today for  hospital follow-up.  Residual stroke deficits of mild left-sided weakness, dizziness with quick head movements and unsteadiness on feet. She does endorse some improvement but recently just started therapies. She does endorse increased fatigue since discharge and feels like her mind is slower/delayed.  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  She initially had FMLA which expired on 3/15 and she is requesting for extension.  She is eager to continue with therapies for ongoing improvement.  She continues on DAPT but will be completing 3-week Plavix course in the near future.  Denies bleeding or bruising.  She continues on Crestor 40 mg daily but does endorse myalgias bilateral upper extremities and torso.  Blood pressure today 118/88.  Loop recorder has not shown atrial fibrillation thus far.  No further concerns at this time.     ROS:   14 system review of systems performed and negative with exception of fatigue, weakness, dizziness and gait impairment  PMH:  Past Medical History:  Diagnosis Date  . Asthma   . Hyperlipidemia   . Migraine   . Rotator cuff tear   . Spondylolisthesis    L4-5    PSH:  Past Surgical History:  Procedure Laterality Date  . BUBBLE STUDY  07/30/2019   Procedure: BUBBLE STUDY;  Surgeon: [name], MD;  Location: [facility] ENDOSCOPY;  Service: Cardiovascular;;  . LOOP RECORDER INSERTION N/A 07/30/2019   Procedure: LOOP RECORDER INSERTION;  Surgeon: [name], MD;  Location: [facility] INVASIVE CV LAB;  Service: Cardiovascular;  Laterality: N/A;  . No prior surgery    . TEE WITHOUT CARDIOVERSION N/A 07/30/2019   Procedure: TRANSESOPHAGEAL ECHOCARDIOGRAM (TEE);  Surgeon: [name], MD;  Location: [facility] ENDOSCOPY;  Service: Cardiovascular;  Laterality: N/A;    Social History:  Social History   Socioeconomic History  . Marital status: Married    Spouse name: Not on file  . Number of children: 2  . Years of  education: BSN  . Highest education level: Not on file  Occupational History  . Occupation: [employer]  Tobacco Use  . Smoking status: Never Smoker  . Smokeless tobacco: Never Used  Substance and Sexual Activity  . Alcohol use: No    Alcohol/week: 0.0 standard drinks    Comment: Rare  . Drug use: No  . Sexual activity: Yes    Birth control/protection: None  Other Topics Concern  . Not on file  Social History Narrative   Lives at home w/ her husband and son   Right-handed   Caffeine: seldom, soda on the weekend   Social Determinants of Health   Financial Resource Strain:   . Difficulty of Paying Living Expenses:   Food Insecurity:   . Worried About Programme researcher, broadcasting/film/video in the Last Year:   . Barista in the Last Year:   Transportation Needs:   . Freight forwarder (Medical):   Marland Kitchen Lack of Transportation (Non-Medical):   Physical Activity:   . Days of Exercise per Week:   . Minutes of Exercise per Session:   Stress:   . Feeling of Stress :   Social Connections:   . Frequency of Communication with Friends and Family:   . Frequency of Social Gatherings with Friends and Family:   . Attends Religious Services:   . Active Member of Clubs or Organizations:   . Attends Banker Meetings:   Marland Kitchen Marital Status:   Intimate Partner Violence:   . Fear of Current or Ex-Partner:   . Emotionally Abused:   Marland Kitchen Physically Abused:   . Sexually Abused:     Family History:  Family History  Problem Relation Age of Onset  . Heart disease Father 58       MI  . Hypertension Maternal Grandmother   . Hyperlipidemia Brother     Medications:   Current Outpatient Medications on File Prior to Visit  Medication Sig Dispense Refill  . aspirin EC 81 MG EC tablet Take 1 tablet (81 mg total) by mouth daily.    . cholecalciferol (VITAMIN D) 1000 UNITS tablet Take 1,000 Units by mouth daily.    . clopidogrel (PLAVIX) 75 MG tablet Take 1 tablet (75 mg total) by mouth daily. 19  tablet 0  . Omega-3 Fatty Acids (FISH OIL) 500 MG CAPS Natures made Fish oil pearls 500mg  BID (Patient taking differently: Take 1 capsule by mouth daily. Natures made Fish oil pearls 500mg  BID) 180 capsule 1  . Probiotic Product (PROBIOTIC PO) Take 1 capsule by mouth daily.     . rizatriptan (MAXALT) 10 MG tablet Take 10 mg by mouth as needed for migraine (headache).     . rosuvastatin (CRESTOR) 40 MG tablet Take 1 tablet (40 mg total) by mouth daily. 30 tablet 0   No current facility-administered medications on file prior to visit.    Allergies:   Allergies  Allergen Reactions  . Tape Rash     Physical Exam  Vitals:   08/13/19 1308  BP:  118/88  Pulse: 75  Temp: (!) 97.5 F (36.4 C)  SpO2: 95%  Weight: 164 lb 3.2 oz (74.5 kg)  Height: 5\' 4"  (1.626 m)   Body mass index is 28.18 kg/m. No exam data present  General: well developed, well nourished, pleasant middle-age female, seated, in no evident distress Head: head normocephalic and atraumatic.   Neck: supple with no carotid or supraclavicular bruits Cardiovascular: regular rate and rhythm, no murmurs Musculoskeletal: no deformity Skin:  no rash/petichiae Vascular:  Normal pulses all extremities   Neurologic Exam Mental Status: Awake and fully alert.   Normal speech and language.  Oriented to place and time. Recent and remote memory intact. Attention span, concentration and fund of knowledge appropriate. Mood and affect appropriate.  Cranial Nerves: Fundoscopic exam reveals sharp disc margins. Pupils equal, briskly reactive to light. Extraocular movements full without nystagmus. Visual fields full to confrontation. Hearing intact. Facial sensation intact. Face, tongue, palate moves normally and symmetrically.  Motor: Normal bulk and tone. Normal strength in all tested extremity muscles except LUE positive pronator drift and decreased hand dexterity. Sensory.: intact to touch , pinprick , position and vibratory sensation.    Coordination: Rapid alternating movements normal in all extremities except decreased left hand. Finger-to-nose and heel-to-shin performed accurately bilaterally. Gait and Station: Arises from chair without difficulty. Stance is normal. Gait demonstrates normal stride length with moderate imbalance Reflexes: 1+ and symmetric. Toes downgoing.     NIHSS  1 Modified Rankin  2    Diagnostic Data (Labs, Imaging, Testing)   Code Stroke CT head hyperdense R MCA bifurcation. No acute abnormality. R maxillary sinus dz. ASPECTS 10.     CTA head & neck no LVO. Mild distal SMALL VESSEL DISEASE anterior and posterior circulation. Neck ok   CT perfusion Unremarkable   MRI  2 tiny acute cortical R brain (posterior middle frontal gyrus and posterior R parietal lobe). Read as old cerebellar infarcts but  Dr. Leonie Man feels they are flow voids and not old stroke. Mild paranasal sinus dz.   2D Echo normal   TEE  PFO present  EEG normal   LE dopplers no DVT   LDL 151 mg%  HgbA1c 5.3  UDS not done   TSH normal    ASSESSMENT: Tara [name] is a 54 y.o. year old female presented with LUE numbness and weakness along with severe dysarthria and dysphagia on 07/29/2019 with stroke work-up revealing 2 tiny right parietal infarcts embolic secondary to unknown source.  Loop recorder placed which has not shown atrial fibrillation thus far.  Also evidence of PFO with unclear relation to stroke.  Vascular risk factors include HLD and migraines.  Residual deficits of mild LUE weakness and imbalance    PLAN:  1. Cryptogenic right parietal stroke: Continue aspirin 81 mg daily  and Crestor 40 mg daily for secondary stroke prevention.  Will complete 3 weeks DAPT and then continue aspirin alone.  Maintain strict control of hypertension with blood pressure goal below 130/90, diabetes with hemoglobin A1c goal below 6.5% and cholesterol with LDL cholesterol (bad cholesterol) goal below 70 mg/dL.  I also advised  the patient to eat a healthy diet with plenty of whole grains, cereals, fruits and vegetables, exercise regularly with at least 30 minutes of continuous activity daily and maintain ideal body weight.  Continue to monitor loop recorder for atrial fibrillation. 2. HLD: Complaints of possible statin myalgias and recommended 2-day statin vacation to see if myalgias resolve.  If resolved, recommend trialing  dosage decrease to see if better tolerated.  If myalgias do not resolve, advised to restart dosage at 40 mg daily and to follow-up with PCP for further evaluation.  She will continue to follow with PCP ongoing for monitoring and management 3. PFO: Per patient, she was told they will hold off on potential PFO closure until further monitoring of loop recorder and if no atrial fibrillation was found, they would then pursue possible closure.  This will be further discussed with Dr. Pearlean Brownie to ensure no further evaluation is seen at this time. 4. Residual deficits, poststroke: Recommend continued outpatient therapy participation with likely ongoing improvement.  Will extend FMLA until 3/29 due to decreased hand dexterity affecting typing,  imbalance and delayed processing.  Advised patient to call office if she feels as though she can return sooner   Follow up in 3 months or call earlier if needed   I spent 42 minutes of face-to-face and non-face-to-face time with patient.  This included previsit chart review, lab review, study review, order entry, electronic health record documentation, patient education     Ihor Austin, [facility]  [facility] [address] [place], [place] [address]  Phone [phone] Fax [phone] Note: This document was prepared with digital dictation and possible smart phrase technology. Any transcriptional errors that result from this process are unintentional.

## 2019-08-13 NOTE — Patient Instructions (Addendum)
Continue aspirin 81 mg daily  and Crestor  for secondary stroke prevention  Stop plavix after taking for a total of 21 days  Trial stopping crestor for 2 days to see if symptoms improve. If symptoms do not improve, resume at prior dosage and follow up with PCP regarding pains. If symptoms improve, break dosage in half (20mg ) to see if you are able to tolerate better. You may also try CoQ10 enzyme 300mg  daily which you can buy over the counter to help with pains  Continue to participate with outpatient therapy for ongoing improvement - we will also assist with FLMA paperwork - date to return as of now 3/29 but if you feel as though you can return sooner, please call office  We will continue to monitor loop recorder for possible atrial fibrillation   Continue to follow up with PCP regarding cholesterol and blood pressure management   Continue to monitor blood pressure at home  Maintain strict control of hypertension with blood pressure goal below 130/90, diabetes with hemoglobin A1c goal below 6.5% and cholesterol with LDL cholesterol (bad cholesterol) goal below 70 mg/dL. I also advised the patient to eat a healthy diet with plenty of whole grains, cereals, fruits and vegetables, exercise regularly and maintain ideal body weight.  Followup in the future with me in 3 months or call earlier if needed       Thank you for coming to see at [facility]. I hope we have been able to provide you high quality care today.  You may receive a patient satisfaction survey over the next few weeks. We would appreciate your feedback and comments so that we may continue to improve ourselves and the health of our patients.

## 2019-08-14 ENCOUNTER — Ambulatory Visit: Payer: 59 | Admitting: Physical Therapy

## 2019-08-14 ENCOUNTER — Other Ambulatory Visit: Payer: Self-pay

## 2019-08-14 DIAGNOSIS — R2689 Other abnormalities of gait and mobility: Secondary | ICD-10-CM | POA: Diagnosis not present

## 2019-08-14 DIAGNOSIS — M6281 Muscle weakness (generalized): Secondary | ICD-10-CM

## 2019-08-14 DIAGNOSIS — I69318 Other symptoms and signs involving cognitive functions following cerebral infarction: Secondary | ICD-10-CM | POA: Diagnosis not present

## 2019-08-14 DIAGNOSIS — R2681 Unsteadiness on feet: Secondary | ICD-10-CM | POA: Diagnosis not present

## 2019-08-14 DIAGNOSIS — R278 Other lack of coordination: Secondary | ICD-10-CM | POA: Diagnosis not present

## 2019-08-14 NOTE — Patient Instructions (Signed)
Access Code: [email or web address] URL: [email or web address] Date: 08/14/2019 Prepared by: Sherlie Ban  Exercises Seated Gaze Stabilization with Head Rotation/Vertical Nods - 1-2 x daily - 5 x weekly - 3 sets - 5 reps Sit to Stand - 1-2 x daily - 5 x weekly - 3 sets - 5 reps Standing Marching - 1-2 x daily - 5 x weekly - 3 sets

## 2019-08-14 NOTE — Addendum Note (Signed)
Addended by: Gean Maidens on: 08/14/2019 01:22 PM   Modules accepted: Orders

## 2019-08-14 NOTE — Therapy (Signed)
[facility] [address] [place], [place], [address] Phone: [phone]   Fax:  [phone]  Physical Therapy Treatment  Patient Details  Name: Tara [name] MRN: [id number] Date of Birth: [birth year] No data recorded  Encounter Date: 08/14/2019  PT End of Session - 08/14/19 0855    Visit Number  2    Number of Visits  13    Authorization Type  [facility]    PT Start Time  0801    PT Stop Time  0846    PT Time Calculation (min)  45 min    Activity Tolerance  Patient tolerated treatment well    Behavior During Therapy  [facility] for tasks assessed/performed   Guarded with gait      Past Medical History:  Diagnosis Date  . Asthma   . Hyperlipidemia   . Migraine   . Rotator cuff tear   . Spondylolisthesis    L4-5    Past Surgical History:  Procedure Laterality Date  . BUBBLE STUDY  07/30/2019   Procedure: BUBBLE STUDY;  Surgeon: [name], MD;  Location: [facility] ENDOSCOPY;  Service: Cardiovascular;;  . LOOP RECORDER INSERTION N/A 07/30/2019   Procedure: LOOP RECORDER INSERTION;  Surgeon: [name], MD;  Location: [facility] INVASIVE CV LAB;  Service: Cardiovascular;  Laterality: N/A;  . No prior surgery    . TEE WITHOUT CARDIOVERSION N/A 07/30/2019   Procedure: TRANSESOPHAGEAL ECHOCARDIOGRAM (TEE);  Surgeon: [name], MD;  Location: [facility] ENDOSCOPY;  Service: Cardiovascular;  Laterality: N/A;    There were no vitals filed for this visit.  Subjective Assessment - 08/14/19 0803    Subjective  reports that she has still been feeling a little swimmy headed - there is an improvement from last week. when turning head to the side, gets a little lightheaded. Has not been driving because is scared of turning her head. No falls. has constant aches down both arms.    Patient Stated Goals  Pt's goals for therapy are to strengthen to help with the tired feeling.    Currently in Pain?  Yes    Pain Score  3     Pain Location  Arm   B arms down through elbows   Pain Orientation  Right;Left    Pain Descriptors / Indicators  Aching    Pain Type  Acute pain   coming out of the hospital   Aggravating Factors   haven't noticed anything    Pain Relieving Factors  don't know             Vestibular Assessment - 08/14/19 0001      Vestibular Assessment   General Observation  ambulates with no AD into clinic, guarded with head motions      Symptom Behavior   Subjective history of current problem  Swimmyheadedness started after the stroke. "feels off". has to stabilize her vision and herself. sometimes will feel nausea    Type of Dizziness   Lightheadedness;"Funny feeling in head"    Frequency of Dizziness  everytime pt has a sudden movement    Duration of Dizziness  1 minute or 2    Symptom Nature  Motion provoked    Aggravating Factors  Turning head quickly;Comment   or looking down or looking up   Relieving Factors  Comments   staying still   Progression of Symptoms  Better      Oculomotor Exam   Ocular ROM  [facility]  Spontaneous  Absent    Smooth Pursuits  Intact    Saccades  Hypometric   no dizziness, "has a weird feeling"      Oculomotor Exam-Fixation Suppressed    Left Head Impulse  --   difficult to assess due to guarding, dizziness 1/10   Right Head Impulse  --   difficult to assess due to guarding, dizziness 1/10     Vestibulo-Ocular Reflex   VOR to Slow Head Movement  Comment   2/10 dizziness   VOR Cancellation  Comment   felt woozy, 3/10 dizziness, "weird feeling"              [facility] Adult PT Treatment/Exercise - 08/14/19 0001      Transfers   Transfers  Sit to Stand;Stand to Sit    Sit to Stand  5: Supervision;Without upper extremity assist;From chair/3-in-1    Stand to Sit  5: Supervision;Without upper extremity assist;To chair/3-in-1    Comments  3 x 5 reps - added to HEP      Neuro Re-ed    Neuro Re-ed Details   standing marching with single UE support,  down and back at countertop 3 reps, cues for technique and spotting an object in order to decr dizziness.       Vestibular Treatment/Exercise - 08/14/19 0001      Vestibular Treatment/Exercise   Gaze Exercises  X1 Viewing Horizontal;X1 Viewing Vertical      X1 Viewing Horizontal   Foot Position  seated in chair with back support    Reps  5    Comments  x3 reps, mild symptoms       X1 Viewing Vertical   Foot Position  seated in chair with back support    Reps  5    Comments  x3 reps, mild symptoms        Initial HEP:  Access Code: [email or web address] URL: [email or web address] Date: 08/14/2019 Prepared by: Janann August  Exercises Seated Gaze Stabilization with Head Rotation/Vertical Nods - 1-2 x daily - 5 x weekly - 3 sets - 5 reps Sit to Stand - 1-2 x daily - 5 x weekly - 3 sets - 5 reps Standing Marching - 1-2 x daily - 5 x weekly - 3 sets     PT Education - 08/14/19 0855    Education Details  findings from vestibular assessment, initial HEP, focusing on a target when pt is feeling incr dizziness/swimmyheaded    Person(s) Educated  Patient    Methods  Explanation;Demonstration;Handout    Comprehension  Returned demonstration;Verbalized understanding       PT Short Term Goals - 08/08/19 1616      PT SHORT TERM GOAL #1   Title  Pt will be independent with HEP for improved strength, balance, and gait.  TARGET for all STGs:  4 weeks:  09/05/2019    Time  4    Period  Weeks    Status  New      PT SHORT TERM GOAL #2   Title  Pt will improve 5x sit<>stand to less than or equal to 15 seconds for improved functional lower extremity strength.    Baseline  20.43 sec    Time  4    Period  Weeks      PT SHORT TERM GOAL #3   Title  Pt will improve DGI score to at least 16/24 for decresaed fall risk.    Time  4    Period  Weeks    Status  New      PT SHORT TERM GOAL #4   Title  Pt will verbalize understanding of:  fall prevention in home environment and CVA  warning signs/symptoms    Time  4    Period  Weeks        PT Long Term Goals - 08/08/19 1618      PT LONG TERM GOAL #1   Title  Pt will be independent with progression of HEP to address strength, balance, gait.  TARGET 09/19/2019 (may be delayed due to scheduling)    Time  6    Period  Weeks    Status  New      PT LONG TERM GOAL #2   Title  Pt will improve 5x sit<>stand to less than or equal to 12 seconds for improved functional strength.    Time  6    Period  Weeks    Status  New      PT LONG TERM GOAL #3   Title  Pt will improve DGI score to at least 19/24 for decreased fall risk.    Time  6    Period  Weeks    Status  New      PT LONG TERM GOAL #4   Title  Pt will ambulate at least 1000 ft, indoor and outdoor surfaces, modified independently no LOB for return to independent community gait.    Time  6    Period  Weeks    Status  New            Plan - 08/14/19 1610    Clinical Impression Statement  Performed vestibular assessment due to pt reports of feeling swimmyheaded - esp with head turns and head nods during gait. Pt with incr dizziness with VOR to slow head movement and VOR cancellation, indicating visual motion sensitivity. Unable to fully assess head impulse test due to incr neck guarding, however pt with incr dizziness. Initiated seated VOR x1 with back support in horizontal/vertical directions with pt reporting mild symptoms - added to HEP. Discussed with pt when feeling dizzy to spot a target, pt performing a couple times throughout session after reporting mild dizziness, with dizziness subsiding. Remainder of session focused on initiating HEP for LE strength and balance. Will continue to progress towards LTGs.    Personal Factors and Comorbidities  Comorbidity 3+    Comorbidities  PMH:  asthma, hyperlipidemia, spondyloslisthesis, R RTC tear    Examination-Activity Limitations  Locomotion Level;Transfers;Stand    Examination-Participation Restrictions   Community Activity;Yard Work;Other   walking for exercise in neighborhood, return to work   Stability/Clinical Decision Making  Evolving/Moderate complexity    Rehab Potential  Good    PT Frequency  2x / week    PT Duration  6 weeks   plus eval week; total POC = 7 weeks   PT Treatment/Interventions  ADLs/Self Care Home Management;Gait training;Stair training;Functional mobility training;Therapeutic activities;Therapeutic exercise;Balance training;Neuromuscular re-education;DME Instruction;Patient/family education;Electrical Stimulation;Orthotic Fit/Training    PT Next Visit Plan  progress VOR x1 - try 10 reps, review. Initiate HEP for LLE strengthening (weightbearing/functional strengthening), balance. assess balance with eyes closed/head turns on level ground.    Consulted and Agree with Plan of Care  Patient       Patient will benefit from skilled therapeutic intervention in order to improve the following deficits and impairments:  Abnormal gait, Difficulty walking, Decreased endurance, Decreased balance, Decreased activity tolerance, Decreased strength, Decreased mobility  Visit Diagnosis: Muscle weakness (generalized)  Unsteadiness  on feet  Other abnormalities of gait and mobility  Other lack of coordination     Problem List Patient Active Problem List   Diagnosis Date Noted  . CVA (cerebral vascular accident) (HCC) 07/29/2019  . Dysphagia 07/29/2019  . Left hand weakness 07/29/2019  . Palpitations 09/15/2015  . Diastolic dysfunction 09/15/2015  . Renal cyst, left 06/19/2013  . History of migraine headaches 03/05/2013  . 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 HLD (hyperlipidemia) 03/05/2013  . 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 Rotator cuff syndrome 03/05/2013  . Low back pain 03/05/2013    Drake Leach, PT, DPT  08/14/2019, 9:10 AM  [facility] [address] [place], [place], [address] Phone:  [phone]   Fax:  [phone]  Name: Tara [name] MRN: [id number] Date of Birth: 1965/09/28

## 2019-08-15 ENCOUNTER — Ambulatory Visit: Payer: 59 | Admitting: Physical Therapy

## 2019-08-15 VITALS — BP 114/83 | HR 79

## 2019-08-15 DIAGNOSIS — R2689 Other abnormalities of gait and mobility: Secondary | ICD-10-CM | POA: Diagnosis not present

## 2019-08-15 DIAGNOSIS — R278 Other lack of coordination: Secondary | ICD-10-CM

## 2019-08-15 DIAGNOSIS — I69318 Other symptoms and signs involving cognitive functions following cerebral infarction: Secondary | ICD-10-CM | POA: Diagnosis not present

## 2019-08-15 DIAGNOSIS — R2681 Unsteadiness on feet: Secondary | ICD-10-CM

## 2019-08-15 DIAGNOSIS — M6281 Muscle weakness (generalized): Secondary | ICD-10-CM | POA: Diagnosis not present

## 2019-08-15 NOTE — Therapy (Signed)
[facility] [address] [place], [place], [address] Phone: [phone]   Fax:  [phone]  Physical Therapy Treatment  Patient Details  Name: Tara [name] MRN: [id number] Date of Birth: 02/10/1966 No data recorded  Encounter Date: 08/15/2019  PT End of Session - 08/15/19 1254    Visit Number  3    Number of Visits  13    Authorization Type  [facility] [employer]    PT Start Time  1018    PT Stop Time  1100    PT Time Calculation (min)  42 min    Activity Tolerance  Patient tolerated treatment well    Behavior During Therapy  WFL for tasks assessed/performed   Guarded with gait      Past Medical History:  Diagnosis Date  . Asthma   . Hyperlipidemia   . Migraine   . Rotator cuff tear   . Spondylolisthesis    L4-5    Past Surgical History:  Procedure Laterality Date  . BUBBLE STUDY  07/30/2019   Procedure: BUBBLE STUDY;  Surgeon: [name], MD;  Location: [facility] ENDOSCOPY;  Service: Cardiovascular;;  . LOOP RECORDER INSERTION N/A 07/30/2019   Procedure: LOOP RECORDER INSERTION;  Surgeon: [name], MD;  Location: [facility] INVASIVE CV LAB;  Service: Cardiovascular;  Laterality: N/A;  . No prior surgery    . TEE WITHOUT CARDIOVERSION N/A 07/30/2019   Procedure: TRANSESOPHAGEAL ECHOCARDIOGRAM (TEE);  Surgeon: [name], MD;  Location: [facility] ENDOSCOPY;  Service: Cardiovascular;  Laterality: N/A;    Vitals:   08/15/19 1037 08/15/19 1058  BP: (!) 126/94 114/83  Pulse: 91 79    Subjective Assessment - 08/15/19 1023    Subjective  Has tried the exercises yesterday, is doing well when keeping her eyes focused on a target. Pain is not as bad in the arms, the tightness is not as bad in the arms.    Patient Stated Goals  Pt's goals for therapy are to strengthen to help with the tired feeling.    Currently in Pain?  Yes    Pain Score  3     Pain Location  Arm    Pain Orientation  Right;Left              Vestibular Assessment - 08/15/19 1247      Vestibular Assessment   General Observation  reports feeling eyes moving/twitching and the pattern on therapist's shirt moving.  Pt denies blurring of vision or double vision.  Pt denies changes in strength, speech, or swallowing.  No headache.      Oculomotor Exam   Oculomotor Alignment  Normal    Spontaneous  Absent    Gaze-induced   Absent    Smooth Pursuits  Intact    Saccades  Slow      Vestibulo-Ocular Reflex   VOR to Slow Head Movement  Normal    VOR Cancellation  Corrective saccades    Comment  involuntary eye movement does not follow pattern of nystagmus (fast phase and slow phase) but appears to be more consistent with ocular clonus.  Pt able to suppress when focusing on a target.               [facility] Adult PT Treatment/Exercise - 08/15/19 0001      Ambulation/Gait   Ambulation/Gait  Yes    Ambulation/Gait Assistance  5: Supervision    Ambulation/Gait Assistance Details  into and out of clinic, pt with guarded head  movements - turns body to talk to therapist when walking vs. head    Assistive device  None    Gait Pattern  Step-through pattern;Decreased arm swing - right;Decreased arm swing - left;Decreased step length - left;Decreased step length - right;Decreased stride length   guarded with gait     Therapeutic Activites    Therapeutic Activities  Other Therapeutic Activities    Other Therapeutic Activities  reviewed signs and symptoms of a CVA  with pt able to list with only minimal cueing from therapist. asked about pt being interested in a speech therapy evaluation due to pt reporting difficulty with memory/word finding, pt reporting not at this time as she would like to focus on OT/PT for now. Therapist looked at her schedule and got pt scheduled for 2 visits next week for PT (due to pt not having another session until April 7th).        Access Code: [email or web address] URL:  [email or web address] Date: 08/15/2019 Prepared by: Janann August  Exercises Seated Gaze Stabilization with Head Rotation/Vertical Nods - 1-2 x daily - 5 x weekly - 3 sets - 5 reps Sit to Stand - 1-2 x daily - 5 x weekly - 3 sets - 5 reps Standing Marching - 1-2 x daily - 5 x weekly - 3 sets  New additions to pt's HEP:  Standing Balance with Eyes Closed - 2 x daily - 5 x weekly - 3 sets - 5-10 hold - in corner with fingertip support and wide BOS, pt reporting 1-2/10 dizziness that subsides after a couple of seconds when opening her eyes  Tandem Stance - 1 x daily - 5 x weekly - 3 sets - 20 hold - with fingertip support on chair, performed in corner       PT Education - 08/15/19 1254    Education Details  additions to HEP, clinical findings from vestibular assessment, reviewed warnings signs and symptoms of a CVA    Person(s) Educated  Patient    Methods  Explanation;Demonstration;Handout    Comprehension  Verbalized understanding;Returned demonstration       PT Short Term Goals - 08/08/19 1616      PT SHORT TERM GOAL #1   Title  Pt will be independent with HEP for improved strength, balance, and gait.  TARGET for all STGs:  4 weeks:  09/05/2019    Time  4    Period  Weeks    Status  New      PT SHORT TERM GOAL #2   Title  Pt will improve 5x sit<>stand to less than or equal to 15 seconds for improved functional lower extremity strength.    Baseline  20.43 sec    Time  4    Period  Weeks      PT SHORT TERM GOAL #3   Title  Pt will improve DGI score to at least 16/24 for decresaed fall risk.    Time  4    Period  Weeks    Status  New      PT SHORT TERM GOAL #4   Title  Pt will verbalize understanding of:  fall prevention in home environment and CVA warning signs/symptoms    Time  4    Period  Weeks        PT Long Term Goals - 08/08/19 1618      PT LONG TERM GOAL #1   Title  Pt will be independent with progression of HEP to address strength, balance,  gait.  TARGET 09/19/2019 (may be delayed due to scheduling)    Time  6    Period  Weeks    Status  New      PT LONG TERM GOAL #2   Title  Pt will improve 5x sit<>stand to less than or equal to 12 seconds for improved functional strength.    Time  6    Period  Weeks    Status  New      PT LONG TERM GOAL #3   Title  Pt will improve DGI score to at least 19/24 for decreased fall risk.    Time  6    Period  Weeks    Status  New      PT LONG TERM GOAL #4   Title  Pt will ambulate at least 1000 ft, indoor and outdoor surfaces, modified independently no LOB for return to independent community gait.    Time  6    Period  Weeks    Status  New            Plan - 08/15/19 1309    Clinical Impression Statement  When pt is sitting on mat table when therapist taking subjective - noticed involuntary eye movements from pt. Asked pt about it and pt stated that it started when she walked back into the clinic and was noticing the pattern on the therapist's shirt moving. Performed a brief vestibular assessment with pt with corrective saccades with VOR cancellation. Pt can suppress when focusing on a target. Involuntary eye movement not follow patterns of nystagmus, but more consistent with ocular clonus. Pt's BP WFL and pt reporting no signs/symptoms of a CVA. 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 At end of session pt reporting her eyes are not "jumping" as much. Remainder of session focused on reviewing signs/symptoms of a CVA and addition of standing balance with eyes closed and wide BOS and tandem stance in corner to HEP. Pt able to perform with only reports of minimal 1-2/10 dizziness with eyes closed that subsided after a couple of seconds. Will continue to progress towards LTGs.    Personal Factors and Comorbidities  Comorbidity 3+    Comorbidities  PMH:  asthma, hyperlipidemia, spondyloslisthesis, R RTC tear    Examination-Activity Limitations  Locomotion  Level;Transfers;Stand    Examination-Participation Restrictions  Community Activity;Yard Work;Other   walking for exercise in neighborhood, return to work   Stability/Clinical Decision Making  Evolving/Moderate complexity    Rehab Potential  Good    PT Frequency  2x / week    PT Duration  6 weeks   plus eval week; total POC = 7 weeks   PT Treatment/Interventions  ADLs/Self Care Home Management;Gait training;Stair training;Functional mobility training;Therapeutic activities;Therapeutic exercise;Balance training;Neuromuscular re-education;DME Instruction;Patient/family education;Electrical Stimulation;Orthotic Fit/Training    PT Next Visit Plan  how is HEP? progress VOR x1 - try 10 reps, review.  how is ocular movement? corner balance with narrow BOS and eyes closed on level ground. try standing balance eyes open with head turns.    Consulted and Agree with Plan of Care  Patient       Patient will benefit from skilled therapeutic intervention in order to improve the following deficits and impairments:  Abnormal gait, Difficulty walking, Decreased endurance, Decreased balance, Decreased activity tolerance, Decreased strength, Decreased mobility  Visit Diagnosis: Muscle weakness (generalized)  Unsteadiness on feet  Other abnormalities of gait and mobility  Other lack of coordination     Problem List Patient Active Problem  List   Diagnosis Date Noted  . CVA (cerebral vascular accident) ([facility]) 07/29/2019  . Dysphagia 07/29/2019  . Left hand weakness 07/29/2019  . Palpitations 09/15/2015  . Diastolic dysfunction 09/15/2015  . Renal cyst, left 06/19/2013  . History of migraine headaches 03/05/2013  . 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 HLD (hyperlipidemia) 03/05/2013  . 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 Rotator cuff syndrome 03/05/2013  . Low back pain 03/05/2013    Drake Leach, PT, DPT  08/15/2019, 1:10 PM  [facility] [address] [place], [place], [address] Phone: [phone]   Fax:  [phone]  Name: Tara [name] MRN: [id number] Date of Birth: Dec 08, 1965

## 2019-08-18 ENCOUNTER — Telehealth: Payer: Self-pay | Admitting: Adult Health

## 2019-08-18 DIAGNOSIS — T466X5A Adverse effect of antihyperlipidemic and antiarteriosclerotic drugs, initial encounter: Secondary | ICD-10-CM | POA: Diagnosis not present

## 2019-08-18 DIAGNOSIS — H539 Unspecified visual disturbance: Secondary | ICD-10-CM | POA: Diagnosis not present

## 2019-08-18 DIAGNOSIS — R29898 Other symptoms and signs involving the musculoskeletal system: Secondary | ICD-10-CM | POA: Diagnosis not present

## 2019-08-18 DIAGNOSIS — E7800 Pure hypercholesterolemia, unspecified: Secondary | ICD-10-CM | POA: Diagnosis not present

## 2019-08-18 DIAGNOSIS — M791 Myalgia, unspecified site: Secondary | ICD-10-CM | POA: Diagnosis not present

## 2019-08-18 DIAGNOSIS — R29818 Other symptoms and signs involving the nervous system: Secondary | ICD-10-CM

## 2019-08-18 DIAGNOSIS — I639 Cerebral infarction, unspecified: Secondary | ICD-10-CM | POA: Diagnosis not present

## 2019-08-18 DIAGNOSIS — R253 Fasciculation: Secondary | ICD-10-CM | POA: Diagnosis not present

## 2019-08-18 MED ORDER — ROSUVASTATIN CALCIUM 20 MG PO TABS: 20.0000 mg | ORAL_TABLET | Freq: Every day | ORAL | 3 refills | Status: DC

## 2019-08-18 NOTE — Telephone Encounter (Signed)
Pt called stating that during her therapy Friday they notice some twitching of her R eye and she has noticed she is seeing small dark spots. Pt would like to discuss with RN.

## 2019-08-18 NOTE — Telephone Encounter (Signed)
I called pt she has noted episodes of eye twitiching R> L started last Thursday at PT, (see note in Epic), ocular clonus.   Last about few minutes, slight dizziness, and nausea.  This has happened 6-7 more times since then.  At PT Bp 126-95 which she states is elevated for her, this was done prior to beginning session.  Also early am noted R eye inner dk spots (comes and goes).  No other sx.  Last vision check was annual 04/2019.  Please advise. Also mentioned that the crestor  Did notice decrease in myalgias from 5 to 3 level, is taking 20mg  po daily now.

## 2019-08-18 NOTE — Telephone Encounter (Signed)
Returned phone call regarding new onset symptoms.  She states on 08/15/2019, she started to experience sensation of R>L eye twitching which was observed during PT session.  Per her PT note, "performed a brief vestibular assessment with patient with corrective saccades with VOR cancellation.  Patient can suppress when focusing on target.  Involuntary eye movement not follow patterns of nystagmus, but more consistent with ocular clonus... No signs/symptoms of CVA.  Findings most consistent with small chronic cerebellar infarcts."  She reports having 6-7 additional episodes since that time typically only lasting for a few minutes without known aggravating factors and will resolve on own.  She reports this morning, she started to experience intermittent "dark spots moving" in right eye more noticeable when looking towards left or inside peripheral vision.  She denies pain, headache, blurred vision, double vision, curtain or shadow visual loss.  She has not experienced symptoms previously.  She does have history of migraines with aura but denies any recent migraines, prior aura symptoms and denies dark spots present during prior migraines.  Case discussed further with Dr. Marjory Lies.  Will obtain MRI w/o contrast to evaluate for possible acute stroke or other abnormality causing current symptoms.  If new stroke found and felt to be embolic, will further discuss pursuing PFO closure with Dr. Pearlean Brownie. Advised any worsening symptoms, new stroke/TIA symptoms or associated pain or loss of vision to call 911 immediately for further evaluation.  Also advised to follow-up with established ophthalmologist for further evaluation.  She was planning on returning to work tomorrow but recommend waiting at this time until further evaluation and improvement of symptoms.  All questions answered and was appreciative of phone call.

## 2019-08-19 ENCOUNTER — Ambulatory Visit: Payer: 59 | Admitting: Physical Therapy

## 2019-08-19 ENCOUNTER — Telehealth: Payer: Self-pay | Admitting: *Deleted

## 2019-08-19 ENCOUNTER — Other Ambulatory Visit: Payer: Self-pay

## 2019-08-19 VITALS — BP 114/82 | HR 80

## 2019-08-19 DIAGNOSIS — M6281 Muscle weakness (generalized): Secondary | ICD-10-CM

## 2019-08-19 DIAGNOSIS — R2689 Other abnormalities of gait and mobility: Secondary | ICD-10-CM

## 2019-08-19 DIAGNOSIS — R278 Other lack of coordination: Secondary | ICD-10-CM | POA: Diagnosis not present

## 2019-08-19 DIAGNOSIS — I69318 Other symptoms and signs involving cognitive functions following cerebral infarction: Secondary | ICD-10-CM | POA: Diagnosis not present

## 2019-08-19 DIAGNOSIS — R2681 Unsteadiness on feet: Secondary | ICD-10-CM

## 2019-08-19 NOTE — Telephone Encounter (Signed)
FMLA completed to JM/NP to review and sign.

## 2019-08-19 NOTE — Telephone Encounter (Signed)
Reviewed and signed

## 2019-08-19 NOTE — Therapy (Signed)
[facility] [address] [place], [place], [address] Phone: [phone]   Fax:  [phone]  Physical Therapy Treatment  Patient Details  Name: Tara [name] MRN: [id number] Date of Birth: 20-May-1966 No data recorded  Encounter Date: 08/19/2019  PT End of Session - 08/19/19 2005    Visit Number  4    Number of Visits  13    Authorization Type  [facility]    PT Start Time  1704    PT Stop Time  1745    PT Time Calculation (min)  41 min    Activity Tolerance  Patient tolerated treatment well    Behavior During Therapy  [facility] for tasks assessed/performed   Guarded with gait      Past Medical History:  Diagnosis Date  . Asthma   . Hyperlipidemia   . Migraine   . Rotator cuff tear   . Spondylolisthesis    L4-5    Past Surgical History:  Procedure Laterality Date  . BUBBLE STUDY  07/30/2019   Procedure: BUBBLE STUDY;  Surgeon: [name], MD;  Location: [facility] ENDOSCOPY;  Service: Cardiovascular;;  . LOOP RECORDER INSERTION N/A 07/30/2019   Procedure: LOOP RECORDER INSERTION;  Surgeon: [name], MD;  Location: [facility] INVASIVE CV LAB;  Service: Cardiovascular;  Laterality: N/A;  . No prior surgery    . TEE WITHOUT CARDIOVERSION N/A 07/30/2019   Procedure: TRANSESOPHAGEAL ECHOCARDIOGRAM (TEE);  Surgeon: [name], MD;  Location: [facility] ENDOSCOPY;  Service: Cardiovascular;  Laterality: N/A;    Vitals:   08/19/19 1713  BP: 114/82  Pulse: 80    Subjective Assessment - 08/19/19 1706    Subjective  Over the weekend - the twitching of her eye happened about 6-8 times and then Sunday with the twitching, got dizzier after the twitching. When looking down and up, noticed she got dizzy and had to hold onto things - maybe like a 3-4/10 dizzy. Notices sometimes when she looks with her gaze, the dots are moving (towards the middle). Has an eye appointment tomorrow afternoon - waiting to get scheduled for the  MRI (after referral from [facility] due to new changes).    Patient Stated Goals  Pt's goals for therapy are to strengthen to help with the tired feeling.    Currently in Pain?  Yes    Pain Score  2     Pain Location  Arm    Pain Orientation  Right    Pain Descriptors / Indicators  Sore                       [facility] Adult PT Treatment/Exercise - 08/19/19 0001      Ambulation/Gait   Ambulation/Gait  Yes    Ambulation/Gait Assistance  5: Supervision;4: Min guard    Ambulation/Gait Assistance Details  into and out of clinic, pt with guarded head movements and turns body vs. head to talk to therapist, intermittent episodes of pt needing to briefly hold onto the wall/objects for balance    Ambulation Distance (Feet)  115 Feet    Assistive device  None    Gait Pattern  Step-through pattern;Decreased arm swing - right;Decreased arm swing - left;Decreased step length - left;Decreased step length - right;Decreased stride length      Vestibular Treatment/Exercise - 08/19/19 0001      X1 Viewing Horizontal   Foot Position  seated in chair with back support    Reps  5    Comments  x3 reps, cues for technique and incr speed, mild symptoms, 2 x 8 reps - mild dizziness 1-2/10      X1 Viewing Vertical   Foot Position  seated in chair with back support    Reps  5    Comments  x1 reports a little bit of pressure behind the eye brows when looking up - does not feel it when looking at eye level           Balance Exercises - 08/19/19 2012      Balance Exercises: Standing   Other Standing Exercises Comments  Standing in corner on level ground: with wide BOS progressing to hip width distance: eyes open gentle head turns R/L - 1/10 dizziness, approx. 6 reps of 5, performed slowly, waiting for dizziness to subside before next set. Attempted one set of more narrow BOS, pt reporting incr dizziness to 2/10 after a couple head turns. BOS looking down <> midline, needing intermittent UE  support 4 x 5 reps, dizziness at 1/10, waiting for dizziness to subside before next rep. Standing in corner with wide BOS on foam: eyes open 5 x 20 seconds, intermittent UE support and postural sway, minimal dizziness. Weight shifting from heel <> toes x10 reps with intermittent UE support, minimal dizziness that is able to subside with standing rest break. At countertop: tandem walking down and back x1 rep, pt reporting 3/10 dizziness when having to perform a turn at counter.           PT Short Term Goals - 08/08/19 1616      PT SHORT TERM GOAL #1   Title  Pt will be independent with HEP for improved strength, balance, and gait.  TARGET for all STGs:  4 weeks:  09/05/2019    Time  4    Period  Weeks    Status  New      PT SHORT TERM GOAL #2   Title  Pt will improve 5x sit<>stand to less than or equal to 15 seconds for improved functional lower extremity strength.    Baseline  20.43 sec    Time  4    Period  Weeks      PT SHORT TERM GOAL #3   Title  Pt will improve DGI score to at least 16/24 for decresaed fall risk.    Time  4    Period  Weeks    Status  New      PT SHORT TERM GOAL #4   Title  Pt will verbalize understanding of:  fall prevention in home environment and CVA warning signs/symptoms    Time  4    Period  Weeks        PT Long Term Goals - 08/08/19 1618      PT LONG TERM GOAL #1   Title  Pt will be independent with progression of HEP to address strength, balance, gait.  TARGET 09/19/2019 (may be delayed due to scheduling)    Time  6    Period  Weeks    Status  New      PT LONG TERM GOAL #2   Title  Pt will improve 5x sit<>stand to less than or equal to 12 seconds for improved functional strength.    Time  6    Period  Weeks    Status  New      PT LONG TERM GOAL #3   Title  Pt will improve DGI score  to at least 19/24 for decreased fall risk.    Time  6    Period  Weeks    Status  New      PT LONG TERM GOAL #4   Title  Pt will ambulate at least 1000 ft,  indoor and outdoor surfaces, modified independently no LOB for return to independent community gait.    Time  6    Period  Weeks    Status  New            Plan - 08/19/19 2006    Clinical Impression Statement  Pt called Frann Rider with [facility] yesterday due to eye twithing concerns and reports of "black spots" in her vision - going to obtain an MRI for further evaluation and encouraged on-going participation with therapy at this time. Pt's BP WFL for session today. Focus of today's skilled session was seated VOR x1 and balance strategies. When ambulating into and out of clinic today pt needing to intermittently grab onto the walls for balance. Pt reporting minimal 1-2/10 dizziness with activities today and needed brief standing rest breaks throughout to let dizziness subside. Had the most dizziness when performing a turn at the countertop. Had incr dizziness and a sensation behind her eyes when looking up with seated VORx1, that subsided once head back in neutral. Will continue to progress towards LTGs.    Personal Factors and Comorbidities  Comorbidity 3+    Comorbidities  PMH:  asthma, hyperlipidemia, spondyloslisthesis, R RTC tear    Examination-Activity Limitations  Locomotion Level;Transfers;Stand;Stairs    Examination-Participation Restrictions  Community Activity;Yard Work;Other   walking for exercise in neighborhood, return to work   Stability/Clinical Decision Making  Evolving/Moderate complexity    Rehab Potential  Good    PT Frequency  2x / week    PT Duration  6 weeks   plus eval week; total POC = 7 weeks   PT Treatment/Interventions  ADLs/Self Care Home Management;Gait training;Stair training;Functional mobility training;Therapeutic activities;Therapeutic exercise;Balance training;Neuromuscular re-education;DME Instruction;Patient/family education;Electrical Stimulation;Orthotic Fit/Training    PT Next Visit Plan  practice turns - spotting an object. how was eye doctor?  progress  VOR x1 - try 10 reps, review.  how is ocular movement? corner balance with narrow BOS and eyes closed on level ground. try standing balance eyes open with head turns.    Consulted and Agree with Plan of Care  Patient       Patient will benefit from skilled therapeutic intervention in order to improve the following deficits and impairments:  Abnormal gait, Difficulty walking, Decreased endurance, Decreased balance, Decreased activity tolerance, Decreased strength, Decreased mobility  Visit Diagnosis: Unsteadiness on feet  Muscle weakness (generalized)  Other abnormalities of gait and mobility  Other lack of coordination     Problem List Patient Active Problem List   Diagnosis Date Noted  . CVA (cerebral vascular accident) ([place]) 07/29/2019  . Dysphagia 07/29/2019  . Left hand weakness 07/29/2019  . Palpitations 09/15/2015  . Diastolic dysfunction 98/92/1194  . Renal cyst, left 06/19/2013  . History of migraine headaches 03/05/2013  . Complicated migraine 17/40/8144  . HLD (hyperlipidemia) 03/05/2013  . 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 Rotator cuff syndrome 03/05/2013  . Low back pain 03/05/2013    Arliss Journey, PT, DPT  08/19/2019, 8:16 PM  [place] [address] [place], [place], [address] Phone: [phone]   Fax:  [phone]  Name: Tara [name] MRN: [id number] Date of Birth: February 23, 1966

## 2019-08-20 DIAGNOSIS — H43393 Other vitreous opacities, bilateral: Secondary | ICD-10-CM | POA: Diagnosis not present

## 2019-08-20 NOTE — Telephone Encounter (Signed)
Attempted to call patient to follow-up on recent symptoms.  At prior visit, FMLA agreed end date 3/29 but this may have to be extended.  If patient calls back, please see how she is doing and if she feels ready to return to work or if FMLA date needs to be adjusted.  Also, if she has been seen by ophthalmology request office visit note be faxed to office for further review.  She should receive a call to obtain MRI in the near future.

## 2019-08-20 NOTE — Telephone Encounter (Signed)
Questioning date of return.  JM/NP to call pt and check on her.

## 2019-08-21 NOTE — Telephone Encounter (Signed)
Recommend extending FMLA due to ongoing dizziness and recent onset of symptoms.  Recommend extending for additional 2 weeks with end date 09/05/2019.  If symptoms start improving prior to that time, please advise her to notify us for further determination of safety of returning to work

## 2019-08-21 NOTE — Telephone Encounter (Signed)
Pt has called back in response to Jessica,NP's message.  Pt stated that her PCP made the suggestion that she takes more time for the neurological concerns(no length of time was stated) pt said she could try and return on the 29th.  Pt said re: seeing the eye doctor she was told no tears, no bleeding and that it was a normal exam.  Pt was given the office fax# so the notes could be faxed to the attention of Jessica,NP.

## 2019-08-21 NOTE — Telephone Encounter (Signed)
Speaking with pt, decided on 09-01-19 return date.  This was updated on FMLA [employer] form. Fax confirmation received. Copy ot be mailed to pt.  [employer] form received at desk for completion.  Pt made aware.

## 2019-08-22 ENCOUNTER — Other Ambulatory Visit: Payer: Self-pay

## 2019-08-22 ENCOUNTER — Ambulatory Visit: Payer: 59 | Admitting: Physical Therapy

## 2019-08-22 ENCOUNTER — Encounter: Payer: Self-pay | Admitting: Physical Therapy

## 2019-08-22 VITALS — BP 104/79 | HR 74

## 2019-08-22 DIAGNOSIS — R2681 Unsteadiness on feet: Secondary | ICD-10-CM | POA: Diagnosis not present

## 2019-08-22 DIAGNOSIS — M6281 Muscle weakness (generalized): Secondary | ICD-10-CM

## 2019-08-22 DIAGNOSIS — R278 Other lack of coordination: Secondary | ICD-10-CM

## 2019-08-22 DIAGNOSIS — R2689 Other abnormalities of gait and mobility: Secondary | ICD-10-CM

## 2019-08-22 DIAGNOSIS — I69318 Other symptoms and signs involving cognitive functions following cerebral infarction: Secondary | ICD-10-CM | POA: Diagnosis not present

## 2019-08-22 NOTE — Therapy (Signed)
[name] [address] [place], [place], [address] Phone: [phone]   Fax:  [phone]  Physical Therapy Treatment  Patient Details  Name: Tara [name] MRN: [id number] Date of Birth: 07/23/1965 No data recorded  Encounter Date: 08/22/2019  PT End of Session - 08/22/19 1158    Visit Number  5    Number of Visits  13    Authorization Type  [facility]    PT Start Time  0804    PT Stop Time  0845    PT Time Calculation (min)  41 min    Activity Tolerance  Patient tolerated treatment well    Behavior During Therapy  [facility] for tasks assessed/performed   Guarded with gait      Past Medical History:  Diagnosis Date  . Asthma   . Hyperlipidemia   . Migraine   . Rotator cuff tear   . Spondylolisthesis    L4-5    Past Surgical History:  Procedure Laterality Date  . BUBBLE STUDY  07/30/2019   Procedure: BUBBLE STUDY;  Surgeon: Donato Heinz, MD;  Location: [employer];  Service: Cardiovascular;;  . LOOP RECORDER INSERTION N/A 07/30/2019   Procedure: LOOP RECORDER INSERTION;  Surgeon: Evans Lance, MD;  Location: [facility];  Service: Cardiovascular;  Laterality: N/A;  . No prior surgery    . TEE WITHOUT CARDIOVERSION N/A 07/30/2019   Procedure: TRANSESOPHAGEAL ECHOCARDIOGRAM (TEE);  Surgeon: Donato Heinz, MD;  Location: [facility] ENDOSCOPY;  Service: Cardiovascular;  Laterality: N/A;    Vitals:   08/22/19 0839  BP: 104/79  Pulse: 74    Subjective Assessment - 08/22/19 0806    Subjective  had an episode where she had increased dizziness yesterday - did more than what she normally does, and was doing laundry slowly. Went to the eye doctor on Wednesday - and they said everything was normal. Has been doing the letter exercises - the dizziness is not as bad. when she stands up and looks down, thats when she gets dizzy. prolonged looking at the screen also causes dizziness.    Patient Stated  Goals  Pt's goals for therapy are to strengthen to help with the tired feeling.    Currently in Pain?  Yes    Pain Score  2     Pain Location  Abdomen    Pain Orientation  Right    Pain Descriptors / Indicators  Sore                       [facility] Adult PT Treatment/Exercise - 08/22/19 0001      Transfers   Transfers  Sit to Stand;Stand to Sit    Sit to Stand  5: Supervision;Without upper extremity assist;From chair/3-in-1    Stand to Sit  5: Supervision;Without upper extremity assist;To chair/3-in-1    Comments  with X anteriorly in front of pt as target for pt to focus on when performing sit <> stand, only reporting 1/10 dizziness after 1st rep and then no other dizziness      Ambulation/Gait   Ambulation/Gait  Yes    Ambulation/Gait Assistance  5: Supervision;4: Min guard    Ambulation/Gait Assistance Details  pt guarded with head turns during gait and needing min guard for balance at times    Assistive device  None    Gait Pattern  Step-through pattern;Decreased arm swing - right;Decreased arm swing - left;Decreased step length - left;Decreased step length -  right;Decreased stride length      Neuro Re-ed    Neuro Re-ed Details   seated in chair with back support: 5 reps head turns right and left x2 reps, pt reporting 1/10 dizziness- verbally added to pt's HEP      Vestibular Treatment/Exercise - 08/22/19 0001      X1 Viewing Horizontal   Foot Position  seated in chair with back support    Reps  10    Comments  x1 slowly, 2 x 5 reps with slightly incr speed      X1 Viewing Vertical   Foot Position  seated in chair with back support    Reps  5    Comments  x3 reps, mild symptoms 1/10         Balance Exercises - 08/22/19 0842      Balance Exercises: Standing   Other Standing Exercises  standing at countertop: performing quarter turns - cues for turning eyes first then head in order to decr dizziness when turning, x4 reps B, pt initially reporting 1/10  dizziness progressing to none    Other Standing Exercises Comments  in standing with BUE support on chair: looking down <> midline x3 reps with 1-2/10 dizziness, R/L head turns 3 reps x2 with 1/10 dizziness        PT Education - 08/22/19 1157    Education Details  continue with HEP - verbal addition of seated head turns R/L for 5 reps    Person(s) Educated  Patient    Methods  Explanation;Demonstration    Comprehension  Verbalized understanding;Returned demonstration       PT Short Term Goals - 08/08/19 1616      PT SHORT TERM GOAL #1   Title  Pt will be independent with HEP for improved strength, balance, and gait.  TARGET for all STGs:  4 weeks:  09/05/2019    Time  4    Period  Weeks    Status  New      PT SHORT TERM GOAL #2   Title  Pt will improve 5x sit<>stand to less than or equal to 15 seconds for improved functional lower extremity strength.    Baseline  20.43 sec    Time  4    Period  Weeks      PT SHORT TERM GOAL #3   Title  Pt will improve DGI score to at least 16/24 for decresaed fall risk.    Time  4    Period  Weeks    Status  New      PT SHORT TERM GOAL #4   Title  Pt will verbalize understanding of:  fall prevention in home environment and CVA warning signs/symptoms    Time  4    Period  Weeks        PT Long Term Goals - 08/08/19 1618      PT LONG TERM GOAL #1   Title  Pt will be independent with progression of HEP to address strength, balance, gait.  TARGET 09/19/2019 (may be delayed due to scheduling)    Time  6    Period  Weeks    Status  New      PT LONG TERM GOAL #2   Title  Pt will improve 5x sit<>stand to less than or equal to 12 seconds for improved functional strength.    Time  6    Period  Weeks    Status  New  PT LONG TERM GOAL #3   Title  Pt will improve DGI score to at least 19/24 for decreased fall risk.    Time  6    Period  Weeks    Status  New      PT LONG TERM GOAL #4   Title  Pt will ambulate at least 1000 ft, indoor  and outdoor surfaces, modified independently no LOB for return to independent community gait.    Time  6    Period  Weeks    Status  New            Plan - 08/22/19 1205    Clinical Impression Statement  Pt reporting at most 1-2/10 dizziness throughout today's session. Practiced sit <> stands with focusing eyes on a target as pt reports she has been getting dizzy with sit <> stands when having to look down, pt reported decr dizziness when performing. Needed intermittent standing breaks during session to allow dizziness to subside. Pt able to better tolerate seated head turns vs. standing with BUE support. Pt remains guarded with head motions with gait.  Will continue to progress towards LTGs.    Personal Factors and Comorbidities  Comorbidity 3+    Comorbidities  PMH:  asthma, hyperlipidemia, spondyloslisthesis, R RTC tear    Examination-Activity Limitations  Locomotion Level;Transfers;Stand;Stairs    Examination-Participation Restrictions  Community Activity;Yard Work;Other   walking for exercise in neighborhood, return to work   Stability/Clinical Decision Making  Evolving/Moderate complexity    Rehab Potential  Good    PT Frequency  2x / week    PT Duration  6 weeks   plus eval week; total POC = 7 weeks   PT Treatment/Interventions  ADLs/Self Care Home Management;Gait training;Stair training;Functional mobility training;Therapeutic activities;Therapeutic exercise;Balance training;Neuromuscular re-education;DME Instruction;Patient/family education;Electrical Stimulation;Orthotic Fit/Training    PT Next Visit Plan  did pt get MRI done? seated head motions. progress VOR x1 - try 10 reps, review.  how is ocular movement? corner balance with narrow BOS and eyes closed on level ground. try standing balance eyes open with head turns.    Consulted and Agree with Plan of Care  Patient       Patient will benefit from skilled therapeutic intervention in order to improve the following deficits  and impairments:  Abnormal gait, Difficulty walking, Decreased endurance, Decreased balance, Decreased activity tolerance, Decreased strength, Decreased mobility  Visit Diagnosis: Muscle weakness (generalized)  Unsteadiness on feet  Other abnormalities of gait and mobility  Other lack of coordination     Problem List Patient Active Problem List   Diagnosis Date Noted  . CVA (cerebral vascular accident) (HCC) 07/29/2019  . Dysphagia 07/29/2019  . Left hand weakness 07/29/2019  . Palpitations 09/15/2015  . Diastolic dysfunction 09/15/2015  . Renal cyst, left 06/19/2013  . History of migraine headaches 03/05/2013  . 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 HLD (hyperlipidemia) 03/05/2013  . 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 Rotator cuff syndrome 03/05/2013  . Low back pain 03/05/2013    Drake Leach  ,PT, DPT  08/22/2019, 12:09 PM  [facility] [address] [place], [place], [address] Phone: [phone]   Fax:  [phone]  Name: CRESSIDA MILFORD MRN: [id number] Date of Birth: [birth year]

## 2019-08-25 ENCOUNTER — Telehealth: Payer: Self-pay | Admitting: Adult Health

## 2019-08-25 NOTE — Telephone Encounter (Signed)
[facility] auth: [employer] faxed implant info to [facility]. If MRI safe they will reach out to the patient to schedule.

## 2019-08-26 ENCOUNTER — Encounter: Payer: Self-pay | Admitting: Adult Health

## 2019-08-26 NOTE — Telephone Encounter (Signed)
Fax confirmation to [facility] [phone], [phone]. (received).  To MR.

## 2019-08-26 NOTE — Telephone Encounter (Signed)
Patient requested a cb   to discuss paperwork and her future care if she should continue seeing NP or if she should see PCP for disability paperwork pt  prefers a CB from [place] NP

## 2019-08-26 NOTE — Telephone Encounter (Signed)
[employer] form completed with attached ofv note.  To JM/NP for review and signature.

## 2019-08-27 NOTE — Telephone Encounter (Signed)
Patient is scheduled at [facility] for 09/16/19.

## 2019-08-28 NOTE — Telephone Encounter (Signed)
Pt called back stating that she was informed that they will not be sending new forms that the provider can just revise the dates on the form that was filled out before and have it faxed to [employer] and fax number is on the form. Please advise.

## 2019-08-28 NOTE — Telephone Encounter (Signed)
addended the FMLA [employer] date to 09-29-2019.  JM/NP to sign.

## 2019-08-28 NOTE — Telephone Encounter (Signed)
Pt has called back to inform Sandy,RN and Jessica,NP that her PCP Dr Modesto Charon has her return to work date as 05-03.  If there are questions about this pt is asking to be called at [phone].  Pt states she is not near her computer as to why she did not send my chart message.

## 2019-08-29 NOTE — Progress Notes (Signed)
I agree with the above plan 

## 2019-09-01 ENCOUNTER — Ambulatory Visit (INDEPENDENT_AMBULATORY_CARE_PROVIDER_SITE_OTHER): Payer: 59 | Admitting: *Deleted

## 2019-09-01 DIAGNOSIS — I639 Cerebral infarction, unspecified: Secondary | ICD-10-CM | POA: Diagnosis not present

## 2019-09-01 LAB — CUP PACEART REMOTE DEVICE CHECK
Date Time Interrogation Session: 20210403232352
Implantable Pulse Generator Implant Date: 20210303

## 2019-09-01 NOTE — Telephone Encounter (Signed)
Sign and returned back to Hermenia Fiscal, Charity fundraiser.  Thank you.

## 2019-09-01 NOTE — Telephone Encounter (Signed)
Faxed [phone] with confirmation received. [employer] FMLA addendum.

## 2019-09-02 NOTE — Telephone Encounter (Signed)
Thanks. Agree with plan.  Unclear if this is ocular myoclonus or blepharospam It is usually idiopathic or perhaps anxiety related but need to rule out structural lesion

## 2019-09-03 ENCOUNTER — Ambulatory Visit: Payer: 59 | Admitting: Occupational Therapy

## 2019-09-03 ENCOUNTER — Ambulatory Visit: Payer: 59 | Attending: Family Medicine | Admitting: Physical Therapy

## 2019-09-03 ENCOUNTER — Other Ambulatory Visit: Payer: Self-pay

## 2019-09-03 DIAGNOSIS — M6281 Muscle weakness (generalized): Secondary | ICD-10-CM

## 2019-09-03 DIAGNOSIS — R2689 Other abnormalities of gait and mobility: Secondary | ICD-10-CM | POA: Insufficient documentation

## 2019-09-03 DIAGNOSIS — R42 Dizziness and giddiness: Secondary | ICD-10-CM | POA: Insufficient documentation

## 2019-09-03 DIAGNOSIS — R278 Other lack of coordination: Secondary | ICD-10-CM | POA: Insufficient documentation

## 2019-09-03 DIAGNOSIS — I69318 Other symptoms and signs involving cognitive functions following cerebral infarction: Secondary | ICD-10-CM | POA: Diagnosis not present

## 2019-09-03 DIAGNOSIS — R2681 Unsteadiness on feet: Secondary | ICD-10-CM | POA: Diagnosis not present

## 2019-09-03 NOTE — Patient Instructions (Signed)
1. Grip Strengthening (Resistive Putty)   Squeeze putty using thumb and all fingers. Repeat _20___ times. Do __2__ sessions per day.   2. Roll putty into tube on table and pinch between each finger and thumb x 10 reps each. (can do ring and small finger together)     Copyright  [employer]. All rights reserved.   

## 2019-09-03 NOTE — Patient Instructions (Signed)
Access Code: [email or web address] URL: [email or web address] Date: 09/03/2019 Prepared by: Sherlie Ban  Exercises Seated Gaze Stabilization with Head Rotation/Vertical Nods - 1-2 x daily - 5 x weekly - 3 sets - 5 reps Sit to Stand - 1-2 x daily - 5 x weekly - 3 sets - 5 reps Standing Marching - 1-2 x daily - 5 x weekly - 3 sets Standing Balance with Eyes Closed - 2 x daily - 5 x weekly - 3 sets - 15 hold Tandem Stance - 1 x daily - 5 x weekly - 3 sets - 20 hold Seated Nose to Left Knee Vestibular Habituation - 2 x daily - 5 x weekly - 1 sets - 3-4 reps Seated Nose to Right Knee Vestibular Habituation - 2 x daily - 5 x weekly - 1 sets - 3-4 reps Vestibular Habituation - Seated Horizontal Head Rotation - 1 x daily - 5 x weekly - 3 sets - 5 reps Seated Head Nods Vestibular Habituation - 1 x daily - 5 x weekly - 3 sets - 5 reps

## 2019-09-03 NOTE — Progress Notes (Signed)
ILR Remote 

## 2019-09-04 ENCOUNTER — Institutional Professional Consult (permissible substitution): Payer: 59 | Admitting: Neurology

## 2019-09-04 NOTE — Therapy (Signed)
[facility] [address] [place], [place], [address] Phone: [phone]   Fax:  [phone]  Physical Therapy Treatment  Patient Details  Name: Tara [name] MRN: [id number] Date of Birth: 08-09-1965 No data recorded  Encounter Date: 09/03/2019  PT End of Session - 09/04/19 0831    Visit Number  6    Number of Visits  13    Authorization Type  [facility]    PT Start Time  1621    PT Stop Time  1708    PT Time Calculation (min)  47 min    Activity Tolerance  Patient tolerated treatment well    Behavior During Therapy  [facility] for tasks assessed/performed   Guarded with gait      Past Medical History:  Diagnosis Date  . Asthma   . Hyperlipidemia   . Migraine   . Rotator cuff tear   . Spondylolisthesis    L4-5    Past Surgical History:  Procedure Laterality Date  . BUBBLE STUDY  07/30/2019   Procedure: BUBBLE STUDY;  Surgeon: [name], MD;  Location: [facility] ENDOSCOPY;  Service: Cardiovascular;;  . LOOP RECORDER INSERTION N/A 07/30/2019   Procedure: LOOP RECORDER INSERTION;  Surgeon: [name], MD;  Location: [facility] INVASIVE CV LAB;  Service: Cardiovascular;  Laterality: N/A;  . No prior surgery    . TEE WITHOUT CARDIOVERSION N/A 07/30/2019   Procedure: TRANSESOPHAGEAL ECHOCARDIOGRAM (TEE);  Surgeon: [name], MD;  Location: [facility] ENDOSCOPY;  Service: Cardiovascular;  Laterality: N/A;    There were no vitals filed for this visit.  Subjective Assessment - 09/03/19 1624    Subjective  When she tries to type on the laptop or reading - she has to rest at times because of feelings of lightheadedness and dizzy. Notices that sometimes she feels like she is walking on water - reports "swimmy headedness". Has noticed that when she is tired thats when her eyes are moving.    Patient Stated Goals  Pt's goals for therapy are to strengthen to help with the tired feeling.    Currently in Pain?  Yes    Pain  Score  3     Pain Location  Arm                 09/03/19 0001  Vestibular Treatment/Exercise  Vestibular Treatment Provided Habituation  Habituation Exercises Seated Horizontal Head Turns;Seated Vertical Head Turns  Seated Horizontal Head Turns  Number of Reps  5 (x4)  Symptom Description  working on incr speed, 1/5 dizziness   Seated Vertical Head Turns  Number of Reps  5 (x4)  Symptom Description  with 1st rep pt performing with more incr speed and rated 2/5 dizziness, 2nd rep performing  more slowly and rating as 1/5, remainder of reps reporting 1/5 dizziness           09/03/19 0001  Positional Sensitivities  Nose to Right Knee 1  Right Knee to Sitting 2  Nose to Left Knee 1  Left Knee to Sitting 2  Head Turning x 5 1  Head Nodding x 5 1         09/03/19 0001  Transfers  Transfers Sit to Stand;Stand to Sit  Sit to Stand 5: Supervision;Without upper extremity assist;From chair/3-in-1  Sit to Stand Details (indicate cue type and reason) cues to focus on  a target  Stand to Sit 5: Supervision;Without upper extremity assist;To chair/3-in-1  Comments with no UE  support from mat table, 2 x 5 reps  slowly and focusing on slow eccentric control when lowering- very minimal dizziness  Neuro Re-ed   Neuro Re-ed Details  seated habituation exercises: R nose over knee x4 reps and then left nose over left knee x4 reps - pt with 1/5 dizziness in each of these positions and dizziness was able to subside after approx. 20 seconds, when coming back up to sitting pt reporting 2/5 dizziness able to decr with fixation on a target, but improved with incr reps. Added to pt's HEP        Balance Exercises - 09/04/19 0839      Balance Exercises: Standing   Standing Eyes Closed  Wide (BOA);Limitations    Standing Eyes Closed Limitations  wide BOS x2 reps for 15 seconds with pt reporting no dizziness, then with more narrow BOS and eyes closed x15 seconds with intermittent UE  support, no dizziness - added to HEP       Access Code: [email or web address] URL: [email or web address] Date: 09/03/2019 Prepared by: Sherlie Ban  Exercises Seated Gaze Stabilization with Head Rotation/Vertical Nods - 1-2 x daily - 5 x weekly - 3 sets - 5 reps Sit to Stand - 1-2 x daily - 5 x weekly - 3 sets - 5 reps Standing Marching - 1-2 x daily - 5 x weekly - 3 sets Standing Balance with Eyes Closed - 2 x daily - 5 x weekly - 3 sets - 15 hold - progressed to more narrow BOS in corner  Tandem Stance - 1 x daily - 5 x weekly - 3 sets - 20 hold  New exercises added below:  Seated Nose to Left Knee Vestibular Habituation - 2 x daily - 5 x weekly - 1 sets - 3-4 reps Seated Nose to Right Knee Vestibular Habituation - 2 x daily - 5 x weekly - 1 sets - 3-4 reps Vestibular Habituation - Seated Horizontal Head Rotation - 1 x daily - 5 x weekly - 3 sets - 5 reps Seated Head Nods Vestibular Habituation - 1 x daily - 5 x weekly - 3 sets - 5 reps  PT Education - 09/04/19 0830    Education Details  new addition of habituation exercises to HEP    Person(s) Educated  Patient    Methods  Explanation;Demonstration;Handout    Comprehension  Verbalized understanding;Returned demonstration       PT Short Term Goals - 09/03/19 1628      PT SHORT TERM GOAL #1   Title  Pt will be independent with HEP for improved strength, balance, and gait.  TARGET for all STGs:  4 weeks:  09/05/2019    Time  4    Period  Weeks    Status  New      PT SHORT TERM GOAL #2   Title  Pt will improve 5x sit<>stand to less than or equal to 15 seconds for improved functional lower extremity strength.    Baseline  21.68 seconds on 09/03/19    Time  4    Period  Weeks      PT SHORT TERM GOAL #3   Title  Pt will improve DGI score to at least 16/24 for decresaed fall risk.    Time  4    Period  Weeks    Status  New      PT SHORT TERM GOAL #4   Title  Pt will verbalize understanding of:  fall prevention in home  environment and CVA  warning signs/symptoms    Time  4    Period  Weeks        PT Long Term Goals - 08/08/19 1618      PT LONG TERM GOAL #1   Title  Pt will be independent with progression of HEP to address strength, balance, gait.  TARGET 09/19/2019 (may be delayed due to scheduling)    Time  6    Period  Weeks    Status  New      PT LONG TERM GOAL #2   Title  Pt will improve 5x sit<>stand to less than or equal to 12 seconds for improved functional strength.    Time  6    Period  Weeks    Status  New      PT LONG TERM GOAL #3   Title  Pt will improve DGI score to at least 19/24 for decreased fall risk.    Time  6    Period  Weeks    Status  New      PT LONG TERM GOAL #4   Title  Pt will ambulate at least 1000 ft, indoor and outdoor surfaces, modified independently no LOB for return to independent community gait.    Time  6    Period  Weeks    Status  New            Plan - 09/04/19 2130    Clinical Impression Statement  Focus of today's skilled session was habituation exercises to address positional sensitivites in sitting with head motions and nose over knee. Pt reporting decr dizziness with incr reps - added to pt's HEP. Able to progress eyes closed with a more narrow BOS on level ground, pt reporting no dizziness while performing - needs intermittent UE assist for balance. Pt continues to remain guarded with head motions during gait and has tendency to turn body vs. head when conversing with therapist. Will continue to progress towards LTGs.    Personal Factors and Comorbidities  Comorbidity 3+    Comorbidities  PMH:  asthma, hyperlipidemia, spondyloslisthesis, R RTC tear    Examination-Activity Limitations  Locomotion Level;Transfers;Stand;Stairs    Examination-Participation Restrictions  Community Activity;Yard Work;Other   walking for exercise in neighborhood, return to work   Stability/Clinical Decision Making  Evolving/Moderate complexity    Rehab Potential  Good     PT Frequency  2x / week    PT Duration  6 weeks   plus eval week; total POC = 7 weeks   PT Treatment/Interventions  ADLs/Self Care Home Management;Gait training;Stair training;Functional mobility training;Therapeutic activities;Therapeutic exercise;Balance training;Neuromuscular re-education;DME Instruction;Patient/family education;Electrical Stimulation;Orthotic Fit/Training    PT Next Visit Plan  MRI will be done on 4/20.  check STGs. review habituation exercises. review and progress VOR x1 if appropriate.  corner balance with narrow BOS and eyes closed on level ground. try standing balance eyes open with head turns.    Consulted and Agree with Plan of Care  Patient       Patient will benefit from skilled therapeutic intervention in order to improve the following deficits and impairments:  Abnormal gait, Difficulty walking, Decreased endurance, Decreased balance, Decreased activity tolerance, Decreased strength, Decreased mobility  Visit Diagnosis: Unsteadiness on feet  Muscle weakness (generalized)  Other abnormalities of gait and mobility  Other lack of coordination     Problem List Patient Active Problem List   Diagnosis Date Noted  . CVA (cerebral vascular accident) (HCC) 07/29/2019  . Dysphagia 07/29/2019  .  Left hand weakness 07/29/2019  . Palpitations 09/15/2015  . Diastolic dysfunction 10/13/3356  . Renal cyst, left 06/19/2013  . History of migraine headaches 03/05/2013  . Complicated migraine 25/18/9842  . HLD (hyperlipidemia) 03/05/2013  . 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 Rotator cuff syndrome 03/05/2013  . Low back pain 03/05/2013    Arliss Journey, PT, DPT  09/04/2019, 12:29 PM  [facility] [address] [place], [place], [address] Phone: [phone]   Fax:  [phone]  Name: Tara [name] MRN: [id number] Date of Birth: 12/16/1965

## 2019-09-04 NOTE — Therapy (Signed)
[facility] [address] [place], [place], [address] Phone: [phone]   Fax:  [phone]  Occupational Therapy Treatment  Patient Details  Name: Tara [name] MRN: [id number] Date of Birth: 1965/10/05 No data recorded  Encounter Date: 09/03/2019  OT End of Session - 09/03/19 1611    Visit Number  2    Number of Visits  13    Date for OT Re-Evaluation  09/26/19    Authorization Type  [facility] [employer]    OT Start Time  1534    OT Stop Time  1612    OT Time Calculation (min)  38 min    Activity Tolerance  Patient tolerated treatment well    Behavior During Therapy  [facility] for tasks assessed/performed       Past Medical History:  Diagnosis Date  . Asthma   . Hyperlipidemia   . Migraine   . Rotator cuff tear   . Spondylolisthesis    L4-5    Past Surgical History:  Procedure Laterality Date  . BUBBLE STUDY  07/30/2019   Procedure: BUBBLE STUDY;  Surgeon: [name], MD;  Location: [facility] ENDOSCOPY;  Service: Cardiovascular;;  . LOOP RECORDER INSERTION N/A 07/30/2019   Procedure: LOOP RECORDER INSERTION;  Surgeon: [name], MD;  Location: [facility] INVASIVE CV LAB;  Service: Cardiovascular;  Laterality: N/A;  . No prior surgery    . TEE WITHOUT CARDIOVERSION N/A 07/30/2019   Procedure: TRANSESOPHAGEAL ECHOCARDIOGRAM (TEE);  Surgeon: [name], MD;  Location: [facility] ENDOSCOPY;  Service: Cardiovascular;  Laterality: N/A;    There were no vitals filed for this visit.  Subjective Assessment - 09/03/19 1537    Pertinent History  CVA 07/29/19. PMH: HLD, migraines, asthma, small chronic cerebellar infarcts    Limitations  fall risk, loop recorder, ? swallowing precautions (chin tuck)    Patient Stated Goals  typing, cooking, cleaning, driving, working    Currently in Pain?  Yes    Pain Score  3     Pain Location  Arm    Pain Orientation  Right    Pain Descriptors / Indicators  Aching    Pain Type  Acute pain     Pain Onset  1 to 4 weeks ago   Rt side prior to stroke   Pain Frequency  Intermittent    Aggravating Factors   forearm rotation             Treatment: Typing test: 16 wpm 87% accuracy, followed by word tris for increased speed/ accuracy. This made pt a little dizzy.(3) Pt performed vision exercises moving card horiziontally  And vertically 5 reps and following with her eyes. Therapist dis not issue as this made pt too dizzy (5/10) Pt was instructed in red putty HEP for sustained grip and pinch, she returned demonstration.                 OT Short Term Goals - 09/04/19 1339      OT SHORT TERM GOAL #1   Title  Independent with HEP for LUE strengthening (shoulder and hand)    Time  3    Period  Weeks    Status  New      OT SHORT TERM GOAL #2   Title  Grip strength Lt hand to be 45 lbs or greater    Baseline  35 lbs (Rt = 56)    Time  3    Period  Weeks  Status  New      OT SHORT TERM GOAL #3   Title  Pt will increase typing speed to 20 wpm, 90% accuracy in prep for return to work    Time  3    Period  Weeks    Status  New      OT SHORT TERM GOAL #4   Title  Pt to perform environmental scanning w/ head turns and no LOB, min dizziness w/ 90% accuracy    Time  3    Period  Weeks    Status  New        OT Long Term Goals - 09/03/19 1547      OT LONG TERM GOAL #1   Title  Pt to report less drops Lt hand t/o day    Time  6    Period  Weeks    Status  New      OT LONG TERM GOAL #2   Title  Pt to perform cleaning tasks for 20 min w/o rest mod I level    Time  6    Period  Weeks    Status  New      OT LONG TERM GOAL #3   Title  Pt to perform full cooking tasks mod I level safely    Time  6    Period  Weeks    Status  New      OT LONG TERM GOAL #4   Title  Pt to simulate work tasks w/ extra time as required    Time  6    Period  Weeks    Status  New            Plan - 09/03/19 1613    Clinical Impression Statement  Pt is  progressing towards goals. she demonstrates understanding of putty exercises.    OT Occupational Profile and History  Problem Focused Assessment - Including review of records relating to presenting problem    Occupational performance deficits (Please refer to evaluation for details):  IADL's;Work;Leisure    Rehab Potential  Good    Clinical Decision Making  Limited treatment options, no task modification necessary    Comorbidities Affecting Occupational Performance:  May have comorbidities impacting occupational performance    Modification or Assistance to Complete Evaluation   No modification of tasks or assist necessary to complete eval    OT Frequency  2x / week    OT Duration  6 weeks   plus eval   OT Treatment/Interventions  Self-care/ADL training;Therapeutic exercise;Functional Mobility Training;Neuromuscular education;Manual Therapy;Therapeutic activities;Coping strategies training;DME and/or AE instruction;Cognitive remediation/compensation;Visual/perceptual remediation/compensation;Moist Heat;Passive range of motion;Patient/family education    Plan  assess typing speed and set goal (STG #3), Issue putty HEP and if time Lt shoulder strengthening HEP    Recommended Other Services  will monitor for need for speech therapy services    Consulted and Agree with Plan of Care  Patient       Patient will benefit from skilled therapeutic intervention in order to improve the following deficits and impairments:           Visit Diagnosis: Muscle weakness (generalized)  Other lack of coordination  Other symptoms and signs involving cognitive functions following cerebral infarction    Problem List Patient Active Problem List   Diagnosis Date Noted  . CVA (cerebral vascular accident) (HCC) 07/29/2019  . Dysphagia 07/29/2019  . Left hand weakness 07/29/2019  . Palpitations 09/15/2015  . Diastolic dysfunction 09/15/2015  .  Renal cyst, left 06/19/2013  . History of migraine headaches  03/05/2013  . Complicated migraine 50/72/2575  . HLD (hyperlipidemia) 03/05/2013  . 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 Rotator cuff syndrome 03/05/2013  . Low back pain 03/05/2013    Keymiah Lyles 09/04/2019, 1:40 PM  [facility] [address] [place], [place], [address] Phone: [phone]   Fax:  [phone]  Name: Tara [name] MRN: [id number] Date of Birth: 07/14/1965

## 2019-09-05 ENCOUNTER — Encounter: Payer: Self-pay | Admitting: Physical Therapy

## 2019-09-05 ENCOUNTER — Other Ambulatory Visit: Payer: Self-pay

## 2019-09-05 ENCOUNTER — Ambulatory Visit: Payer: 59 | Admitting: Physical Therapy

## 2019-09-05 ENCOUNTER — Ambulatory Visit: Payer: 59 | Admitting: Occupational Therapy

## 2019-09-05 DIAGNOSIS — M6281 Muscle weakness (generalized): Secondary | ICD-10-CM

## 2019-09-05 DIAGNOSIS — R2681 Unsteadiness on feet: Secondary | ICD-10-CM | POA: Diagnosis not present

## 2019-09-05 DIAGNOSIS — R2689 Other abnormalities of gait and mobility: Secondary | ICD-10-CM | POA: Diagnosis not present

## 2019-09-05 DIAGNOSIS — R42 Dizziness and giddiness: Secondary | ICD-10-CM | POA: Diagnosis not present

## 2019-09-05 DIAGNOSIS — R278 Other lack of coordination: Secondary | ICD-10-CM | POA: Diagnosis not present

## 2019-09-05 DIAGNOSIS — I69318 Other symptoms and signs involving cognitive functions following cerebral infarction: Secondary | ICD-10-CM | POA: Diagnosis not present

## 2019-09-05 NOTE — Therapy (Signed)
[facility] [address] [place], [place], [address] Phone: [phone]   Fax:  [phone]  Physical Therapy Treatment  Patient Details  Name: Tara [name] MRN: [id number] Date of Birth: Aug 03, 1965 No data recorded  Encounter Date: 09/05/2019  PT End of Session - 09/05/19 1650    Visit Number  7    Number of Visits  13    Authorization Type  [facility]    PT Start Time  0845    PT Stop Time  0934    PT Time Calculation (min)  49 min    Activity Tolerance  Patient tolerated treatment well    Behavior During Therapy  [facility] for tasks assessed/performed   Guarded with gait      Past Medical History:  Diagnosis Date  . Asthma   . Hyperlipidemia   . Migraine   . Rotator cuff tear   . Spondylolisthesis    L4-5    Past Surgical History:  Procedure Laterality Date  . BUBBLE STUDY  07/30/2019   Procedure: BUBBLE STUDY;  Surgeon: Donato Heinz, MD;  Location: [place];  Service: Cardiovascular;;  . LOOP RECORDER INSERTION N/A 07/30/2019   Procedure: LOOP RECORDER INSERTION;  Surgeon: Evans Lance, MD;  Location: [facility] CV LAB;  Service: Cardiovascular;  Laterality: N/A;  . No prior surgery    . TEE WITHOUT CARDIOVERSION N/A 07/30/2019   Procedure: TRANSESOPHAGEAL ECHOCARDIOGRAM (TEE);  Surgeon: Donato Heinz, MD;  Location: [facility] ENDOSCOPY;  Service: Cardiovascular;  Laterality: N/A;    There were no vitals filed for this visit.  Subjective Assessment - 09/05/19 0848    Subjective  I feel like I'm gradually improving.  Focusing on an object helps.  Still have up to 2/10 dizzness, swimmy-headedness.  LIke today, trying to tie my shoes, and that really brought it on.    Patient Stated Goals  Pt's goals for therapy are to strengthen to help with the tired feeling.    Currently in Pain?  Yes    Pain Score  3     Pain Location  Arm    Pain Orientation  Right    Pain Descriptors / Indicators   Aching    Pain Type  Acute pain    Pain Onset  1 to 4 weeks ago    Pain Frequency  Intermittent    Aggravating Factors   movement    Pain Relieving Factors  rest (ice)    Effect of Pain on Daily Activities  OT addressing             Neuro Re-education: Reviewed HEP given last visit: Exercises/Response and progression this visit  . Seated Nose to Left Knee Vestibular Habituation - Performed 1 rep o Reports 1/10 dizziness, staying in position x 1 minute, then 3/10 dizziness upon return to upright sitting, with c/o nausea . Seated Nose to Right Knee Vestibular Habituation - Performed 1 rep o Reports 1/10 dizziness, staying in position x 1 minute, then 2/10 dizziness upon return to upright sitting, no c/o nausea o Second bout to R knee, performed 1 rep with smaller range, cue for chin tuck position, and visual target at knee/floor, pt continues to c/o symptoms when she looks down as well as returning up to sit (nausea upon returning to sit) . Vestibular Habituation - Seated Horizontal Head Rotation - Performed 5 reps o Reports 1/10 dizziness, resolves in approx. 30-60 seconds focusing on steady object in midline . Seated  Head Nods Vestibular Habituation - Performed 5 reps o Reports 1/0 dizziness, resolves in approx. 30-60 seconds focusing on steady object in midline  Pt reports performing these exercises as directed at home from instructions last visit (several times a day), sometimes symptoms are worse than others.  PT cues pt to start the exercises with a chin tuck position versus forward head posture.  Pt performs the exercises overall very slowly and overall small ranges.         [facility] Adult PT Treatment/Exercise - 09/05/19 0928      Transfers   Transfers  Sit to Stand;Stand to Sit    Sit to Stand  5: Supervision;Without upper extremity assist;From chair/3-in-1    Stand to Sit  5: Supervision;Without upper extremity assist;To chair/3-in-1    Comments  Performs 4 reps iwth  no UE support, very slowed motion (>34 seconds)      Ambulation/Gait   Ambulation/Gait  Yes    Ambulation/Gait Assistance  5: Supervision;4: Min guard    Ambulation/Gait Assistance Details  Pt guarded with head motions, arm swing and trunk rotation with gait.  PT provides slight tactile cues at shoulders to try to help with more relaxed arm swing.      Posture/Postural Control   Posture Comments  Pt noted to have forward head posture with habituation exercises.  Pt performs chin tucks x 5 reps, 2 sets, then before seated habituation exercises.  Also performs shoulder rolls forward direction, x 10 reps to relax through shoulders       Self-Care   Self-Care  Other Self-Care Comments    Other Self-Care Comments   Discussed importance of movement, including coordination of head, body movements in daily activities (like tying shoes, laundry tasks, unloading the dishwasher, reaching for purse/phone).  When pt describes these movements (or performs reaching for purse or phone) in therapy session, she is very guarded, and moves trunk, head, body as one unit.  PT tried to explain importance of trying to incorporate more normal movement patterns into daily activities (in addition to exercises) for her body to get used to increased motion.  Discussed why habituation exercises are important for carryover into those activities.  Pt reports increased sense of fear in trying to move in general.             PT Education - 09/05/19 1649    Education Details  See self care notes; also explained POC and added extra appointments (this is week 3 of 6 in POC) in order to get scheduled for full frequency/duration of POC    Person(s) Educated  Patient    Methods  Explanation    Comprehension  Verbalized understanding       PT Short Term Goals - 09/05/19 0926      PT SHORT TERM GOAL #1   Title  Pt will be independent with HEP for improved strength, balance, and gait.  TARGET for all STGs:  4 weeks:  09/05/2019     Time  4    Period  Weeks    Status  New      PT SHORT TERM GOAL #2   Title  Pt will improve 5x sit<>stand to less than or equal to 15 seconds for improved functional lower extremity strength.    Baseline  21.68 seconds on 09/03/19    Time  4    Period  Weeks      PT SHORT TERM GOAL #3   Title  Pt will improve DGI score  to at least 16/24 for decresaed fall risk.    Time  4    Period  Weeks    Status  New      PT SHORT TERM GOAL #4   Title  Pt will verbalize understanding of:  fall prevention in home environment and CVA warning signs/symptoms    Time  4    Period  Weeks        PT Long Term Goals - 08/08/19 1618      PT LONG TERM GOAL #1   Title  Pt will be independent with progression of HEP to address strength, balance, gait.  TARGET 09/19/2019 (may be delayed due to scheduling)    Time  6    Period  Weeks    Status  New      PT LONG TERM GOAL #2   Title  Pt will improve 5x sit<>stand to less than or equal to 12 seconds for improved functional strength.    Time  6    Period  Weeks    Status  New      PT LONG TERM GOAL #3   Title  Pt will improve DGI score to at least 19/24 for decreased fall risk.    Time  6    Period  Weeks    Status  New      PT LONG TERM GOAL #4   Title  Pt will ambulate at least 1000 ft, indoor and outdoor surfaces, modified independently no LOB for return to independent community gait.    Time  6    Period  Weeks    Status  New            Plan - 09/05/19 1650    Clinical Impression Statement  Reviewed pt's HEP from previous session today, with pt taking significant time to perform exercises and to recover between exercises.  She does not rate dizziness above a 2-3; however, she remains very guarded, and dizziness/swimmyheaded symptoms take >1 minute to resolve.  In addition, pt is c/o nausea today with the bending over towards her knee/feet activity.  Tried to have her work in smaller ranges, but that continues to bring on symptoms.   Will conitnue to work on improved overal motion, gait tolearance, improved balance and head/eye motions.    Personal Factors and Comorbidities  Comorbidity 3+    Comorbidities  PMH:  asthma, hyperlipidemia, spondyloslisthesis, R RTC tear    Examination-Activity Limitations  Locomotion Level;Transfers;Stand;Stairs    Examination-Participation Restrictions  Community Activity;Yard Work;Other   walking for exercise in neighborhood, return to work   Stability/Clinical Decision Making  Evolving/Moderate complexity    Rehab Potential  Good    PT Frequency  2x / week    PT Duration  6 weeks   plus eval week; total POC = 7 weeks   PT Treatment/Interventions  ADLs/Self Care Home Management;Gait training;Stair training;Functional mobility training;Therapeutic activities;Therapeutic exercise;Balance training;Neuromuscular re-education;DME Instruction;Patient/family education;Electrical Stimulation;Orthotic Fit/Training    PT Next Visit Plan  MRI will be done on 4/20.  check STGs. review habituation exercises. review and progress VOR x1 if appropriate.  corner balance with narrow BOS and eyes closed on level ground. try standing balance eyes open with head turns.    Consulted and Agree with Plan of Care  Patient       Patient will benefit from skilled therapeutic intervention in order to improve the following deficits and impairments:  Abnormal gait, Difficulty walking, Decreased endurance, Decreased balance, Decreased activity  tolerance, Decreased strength, Decreased mobility  Visit Diagnosis: Unsteadiness on feet  Other abnormalities of gait and mobility     Problem List Patient Active Problem List   Diagnosis Date Noted  . CVA (cerebral vascular accident) (HCC) 07/29/2019  . Dysphagia 07/29/2019  . Left hand weakness 07/29/2019  . Palpitations 09/15/2015  . Diastolic dysfunction 09/15/2015  . Renal cyst, left 06/19/2013  . History of migraine headaches 03/05/2013  . Complicated migraine  03/05/2013  . HLD (hyperlipidemia) 03/05/2013  . 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 Rotator cuff syndrome 03/05/2013  . Low back pain 03/05/2013    Tara Plack W. 09/05/2019, 4:55 PM  Gean Maidens., PT   [facility] [address] [place], [place], [address] Phone: [phone]   Fax:  [phone]  Name: Tara [name] MRN: [id number] Date of Birth: 06-08-1965

## 2019-09-05 NOTE — Patient Instructions (Addendum)
  Vision HEP:  Perform at least 1-2 times per day. Stop if your eye becomes fatigued or hurts and try again later.  1. Hold a small object/card in front of you.  Hold it in the middle at arm's length away.    2.. Slowly move the object side to side in front of you while continuing to watch it with both eyes.  4.  Remember to keep your head still and only move your eyes.  5.  Repeat 5-10 times.  6.  Then, move object up and down while watching it 5-10 times.  Keep your head still.                 . Strengthening: Resisted Flexion   Hold tubing with __left___ arm(s) at side. Pull forward and up. Move shoulder through pain-free range of motion. Repeat __10__ times per set.  Do _1-2_ sessions per day , every other day   Strengthening: Resisted Extension   Hold tubing in __left___ hand(s), arm forward. Pull arm back, elbow straight. Repeat _10___ times per set. Do _1-2___ sessions per day, every other day.        Elbow Flexion: Resisted   With tubing held in __left____ hand(s) and other end secured under foot, curl arm up as far as possible. Repeat _10___ times per set. Do _1-2___ sessions per day, every other day.    Elbow Extension: Resisted   Sit in chair with resistive band secured at armrest (or hold with other hand) and _left______ elbow bent. Straighten elbow. Repeat _10___ times per set.  Do _1-2___ sessions per day, every other day.   Copyright  [employer]. All rights reserved.

## 2019-09-05 NOTE — Therapy (Signed)
[facility] [address] [place], [place], [address] Phone: [phone]   Fax:  [phone]  Occupational Therapy Treatment  Patient Details  Name: Tara [name] MRN: [id number] Date of Birth: 1965-06-09 No data recorded  Encounter Date: 09/05/2019  OT End of Session - 09/05/19 0840    Visit Number  3    Number of Visits  13    Date for OT Re-Evaluation  09/26/19    Authorization Type  [facility]    OT Start Time  0805    OT Stop Time  0845    OT Time Calculation (min)  40 min    Activity Tolerance  Patient tolerated treatment well    Behavior During Therapy  [facility] for tasks assessed/performed       Past Medical History:  Diagnosis Date  . Asthma   . Hyperlipidemia   . Migraine   . Rotator cuff tear   . Spondylolisthesis    L4-5    Past Surgical History:  Procedure Laterality Date  . BUBBLE STUDY  07/30/2019   Procedure: BUBBLE STUDY;  Surgeon: [name], MD;  Location: [facility] ENDOSCOPY;  Service: Cardiovascular;;  . LOOP RECORDER INSERTION N/A 07/30/2019   Procedure: LOOP RECORDER INSERTION;  Surgeon: [name], MD;  Location: [facility] INVASIVE CV LAB;  Service: Cardiovascular;  Laterality: N/A;  . No prior surgery    . TEE WITHOUT CARDIOVERSION N/A 07/30/2019   Procedure: TRANSESOPHAGEAL ECHOCARDIOGRAM (TEE);  Surgeon: [name], MD;  Location: [facility] ENDOSCOPY;  Service: Cardiovascular;  Laterality: N/A;    There were no vitals filed for this visit.  Subjective Assessment - 09/05/19 0807    Pertinent History  CVA 07/29/19. PMH: HLD, migraines, asthma, small chronic cerebellar infarcts    Limitations  fall risk, loop recorder, ? swallowing precautions (chin tuck)    Patient Stated Goals  typing, cooking, cleaning, driving, working    Currently in Pain?  Yes    Pain Score  3     Pain Location  Arm    Pain Orientation  Right    Pain Descriptors / Indicators  Aching    Pain Type  Acute pain     Pain Onset  1 to 4 weeks ago   Rt side prior to stroke   Pain Frequency  Intermittent    Aggravating Factors   movement    Pain Relieving Factors  rest               Treatment: Ice pack applied to right elbow while pt performed vision exercises, and putty with LUE as pt reports pain at right lateral forearm likely from overuse. No adverse reactions. Graded clothespins with LUE for sustained pinch seated and standing            OT Treatment/Education - 09/05/19 0837    Education Details  Yellow theraband exercises HEP, 15 reps each, vision HEP see pt instructions. Reveiw of red putty exercises for grip and pinch, min v.c    Person(s) Educated  Patient    Methods  Explanation;Demonstration;Verbal cues;Handout    Comprehension  Verbalized understanding;Returned demonstration;Verbal cues required       OT Short Term Goals - 09/04/19 1339      OT SHORT TERM GOAL #1   Title  Independent with HEP for LUE strengthening (shoulder and hand)    Time  3    Period  Weeks    Status  New  OT SHORT TERM GOAL #2   Title  Grip strength Lt hand to be 45 lbs or greater    Baseline  35 lbs (Rt = 56)    Time  3    Period  Weeks    Status  New      OT SHORT TERM GOAL #3   Title  Pt will increase typing speed to 20 wpm, 90% accuracy in prep for return to work    Time  3    Period  Weeks    Status  New      OT SHORT TERM GOAL #4   Title  Pt to perform environmental scanning w/ head turns and no LOB, min dizziness w/ 90% accuracy    Time  3    Period  Weeks    Status  New        OT Long Term Goals - 09/03/19 1547      OT LONG TERM GOAL #1   Title  Pt to report less drops Lt hand t/o day    Time  6    Period  Weeks    Status  New      OT LONG TERM GOAL #2   Title  Pt to perform cleaning tasks for 20 min w/o rest mod I level    Time  6    Period  Weeks    Status  New      OT LONG TERM GOAL #3   Title  Pt to perform full cooking tasks mod I level safely     Time  6    Period  Weeks    Status  New      OT LONG TERM GOAL #4   Title  Pt to simulate work tasks w/ extra time as required    Time  6    Period  Weeks    Status  New            Plan - 09/05/19 8413    Clinical Impression Statement  Pt is progressing towards goals. She demonstrates understanding of yellow theraband exercises and vision HEP.    OT Occupational Profile and History  Problem Focused Assessment - Including review of records relating to presenting problem    Occupational performance deficits (Please refer to evaluation for details):  IADL's;Work;Leisure    Rehab Potential  Good    Clinical Decision Making  Limited treatment options, no task modification necessary    Comorbidities Affecting Occupational Performance:  May have comorbidities impacting occupational performance    Modification or Assistance to Complete Evaluation   No modification of tasks or assist necessary to complete eval    OT Frequency  2x / week    OT Duration  6 weeks   plus eval   OT Treatment/Interventions  Self-care/ADL training;Therapeutic exercise;Functional Mobility Training;Neuromuscular education;Manual Therapy;Therapeutic activities;Coping strategies training;DME and/or AE instruction;Cognitive remediation/compensation;Visual/perceptual remediation/compensation;Moist Heat;Passive range of motion;Patient/family education    Plan  Review HEP, work towards goals.    Recommended Other Services  will monitor for need for speech therapy services    Consulted and Agree with Plan of Care  Patient       Patient will benefit from skilled therapeutic intervention in order to improve the following deficits and impairments:           Visit Diagnosis: Muscle weakness (generalized)  Other lack of coordination  Other symptoms and signs involving cognitive functions following cerebral infarction    Problem List Patient Active Problem  List   Diagnosis Date Noted  . CVA (cerebral  vascular accident) (HCC) 07/29/2019  . Dysphagia 07/29/2019  . Left hand weakness 07/29/2019  . Palpitations 09/15/2015  . Diastolic dysfunction 09/15/2015  . Renal cyst, left 06/19/2013  . History of migraine headaches 03/05/2013  . 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 HLD (hyperlipidemia) 03/05/2013  . 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 Rotator cuff syndrome 03/05/2013  . Low back pain 03/05/2013    Nalani Andreen 09/05/2019, 8:42 AM  [facility] [address] [place], [place], [address] Phone: [phone]   Fax:  [phone]  Name: Tara [name] MRN: [id number] Date of Birth: Jan 25, 1966

## 2019-09-08 ENCOUNTER — Institutional Professional Consult (permissible substitution): Payer: 59 | Admitting: Neurology

## 2019-09-10 ENCOUNTER — Ambulatory Visit: Payer: 59 | Admitting: Occupational Therapy

## 2019-09-10 ENCOUNTER — Ambulatory Visit: Payer: 59 | Admitting: Physical Therapy

## 2019-09-10 ENCOUNTER — Other Ambulatory Visit: Payer: Self-pay

## 2019-09-10 ENCOUNTER — Other Ambulatory Visit: Payer: Self-pay | Admitting: Adult Health

## 2019-09-10 ENCOUNTER — Encounter: Payer: Self-pay | Admitting: Physical Therapy

## 2019-09-10 DIAGNOSIS — M6281 Muscle weakness (generalized): Secondary | ICD-10-CM

## 2019-09-10 DIAGNOSIS — R278 Other lack of coordination: Secondary | ICD-10-CM

## 2019-09-10 DIAGNOSIS — R2681 Unsteadiness on feet: Secondary | ICD-10-CM | POA: Diagnosis not present

## 2019-09-10 DIAGNOSIS — I69318 Other symptoms and signs involving cognitive functions following cerebral infarction: Secondary | ICD-10-CM | POA: Diagnosis not present

## 2019-09-10 DIAGNOSIS — R419 Unspecified symptoms and signs involving cognitive functions and awareness: Secondary | ICD-10-CM

## 2019-09-10 DIAGNOSIS — R2689 Other abnormalities of gait and mobility: Secondary | ICD-10-CM | POA: Diagnosis not present

## 2019-09-10 DIAGNOSIS — R42 Dizziness and giddiness: Secondary | ICD-10-CM | POA: Diagnosis not present

## 2019-09-10 NOTE — Progress Notes (Signed)
Tara [name], OT  Ihor Austin, NP  [name],  I am seeing this patient for occupational therapy. She demonstrates delayed processing and reports word finding difficulties. 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  She can benefit from a speech therapy evalution. If you agree please place this order.  Keene Breath, OTR/L     Per request, orders placed for speech therapy evaluation

## 2019-09-10 NOTE — Therapy (Signed)
[facility] [address] [place], [place], [address] Phone: [phone]   Fax:  [phone]  Occupational Therapy Treatment  Patient Details  Name: Tara [name] MRN: [id number] Date of Birth: 11/09/1965 No data recorded  Encounter Date: 09/10/2019  OT End of Session - 09/10/19 1332    Visit Number  4    Number of Visits  13    Date for OT Re-Evaluation  09/26/19    Authorization Type  [facility]    OT Start Time  1017    OT Stop Time  1100    OT Time Calculation (min)  43 min    Activity Tolerance  Patient tolerated treatment well    Behavior During Therapy  [facility] for tasks assessed/performed       Past Medical History:  Diagnosis Date  . Asthma   . Hyperlipidemia   . Migraine   . Rotator cuff tear   . Spondylolisthesis    L4-5    Past Surgical History:  Procedure Laterality Date  . BUBBLE STUDY  07/30/2019   Procedure: BUBBLE STUDY;  Surgeon: Donato Heinz, MD;  Location: [place];  Service: Cardiovascular;;  . LOOP RECORDER INSERTION N/A 07/30/2019   Procedure: LOOP RECORDER INSERTION;  Surgeon: Evans Lance, MD;  Location: [place] CV LAB;  Service: Cardiovascular;  Laterality: N/A;  . No prior surgery    . TEE WITHOUT CARDIOVERSION N/A 07/30/2019   Procedure: TRANSESOPHAGEAL ECHOCARDIOGRAM (TEE);  Surgeon: Donato Heinz, MD;  Location: [facility] ENDOSCOPY;  Service: Cardiovascular;  Laterality: N/A;    There were no vitals filed for this visit.  Subjective Assessment - 09/10/19 1023    Subjective   Pt reports some dizziness this weekend    Currently in Pain?  Yes    Pain Score  2     Pain Location  Elbow    Pain Orientation  Right    Pain Descriptors / Indicators  Aching    Pain Type  Acute pain    Pain Onset  1 to 4 weeks ago    Pain Frequency  Intermittent    Aggravating Factors   overuse    Pain Relieving Factors  rest            Treatment: Reviewed yellow  theraband exercises for LUE 15 reps each, min v.c Seated at table,ice pac applied to right elbow due to pain from overuse, x 10 mins, no adverse reactions, while pt copied small peg design with LUE, pt reports numbness in hand yet only minimal difficulty. Gripper set at level 2 to pick up 1 inch blocks for sustained grip, min difficulty. Environmental scanning with pt missing only 1 items, dizziness was 2/10 following task.                 OT Short Term Goals - 09/04/19 1339      OT SHORT TERM GOAL #1   Title  Independent with HEP for LUE strengthening (shoulder and hand)    Time  3    Period  Weeks    Status  New      OT SHORT TERM GOAL #2   Title  Grip strength Lt hand to be 45 lbs or greater    Baseline  35 lbs (Rt = 56)    Time  3    Period  Weeks    Status  New      OT SHORT TERM GOAL #3   Title  Pt will increase typing speed to 20 wpm, 90% accuracy in prep for return to work    Time  3    Period  Weeks    Status  New      OT SHORT TERM GOAL #4   Title  Pt to perform environmental scanning w/ head turns and no LOB, min dizziness w/ 90% accuracy    Time  3    Period  Weeks    Status  New        OT Long Term Goals - 09/03/19 1547      OT LONG TERM GOAL #1   Title  Pt to report less drops Lt hand t/o day    Time  6    Period  Weeks    Status  New      OT LONG TERM GOAL #2   Title  Pt to perform cleaning tasks for 20 min w/o rest mod I level    Time  6    Period  Weeks    Status  New      OT LONG TERM GOAL #3   Title  Pt to perform full cooking tasks mod I level safely    Time  6    Period  Weeks    Status  New      OT LONG TERM GOAL #4   Title  Pt to simulate work tasks w/ extra time as required    Time  6    Period  Weeks    Status  New            Plan - 09/10/19 1332    Clinical Impression Statement  Pt is progressing slowly towards goals due to significant dizziness. Pt with delayed processing. Therapist requested ST referrall  for pt.    OT Occupational Profile and History  Problem Focused Assessment - Including review of records relating to presenting problem    Occupational performance deficits (Please refer to evaluation for details):  IADL's;Work;Leisure    Rehab Potential  Good    Clinical Decision Making  Limited treatment options, no task modification necessary    Comorbidities Affecting Occupational Performance:  May have comorbidities impacting occupational performance    Modification or Assistance to Complete Evaluation   No modification of tasks or assist necessary to complete eval    OT Frequency  2x / week    OT Duration  6 weeks   plus eval   OT Treatment/Interventions  Self-care/ADL training;Therapeutic exercise;Functional Mobility Training;Neuromuscular education;Manual Therapy;Therapeutic activities;Coping strategies training;DME and/or AE instruction;Cognitive remediation/compensation;Visual/perceptual remediation/compensation;Moist Heat;Passive range of motion;Patient/family education    Plan  Environmental scanning    Recommended Other Services  will monitor for need for speech therapy services    Consulted and Agree with Plan of Care  Patient       Patient will benefit from skilled therapeutic intervention in order to improve the following deficits and impairments:           Visit Diagnosis: Muscle weakness (generalized)  Other lack of coordination  Other symptoms and signs involving cognitive functions following cerebral infarction    Problem List Patient Active Problem List   Diagnosis Date Noted  . CVA (cerebral vascular accident) (HCC) 07/29/2019  . Dysphagia 07/29/2019  . Left hand weakness 07/29/2019  . Palpitations 09/15/2015  . Diastolic dysfunction 09/15/2015  . Renal cyst, left 06/19/2013  . History of migraine headaches 03/05/2013  . 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 HLD (hyperlipidemia) 03/05/2013  . Asthmatic  bronchitis 03/05/2013  . Rotator cuff syndrome  03/05/2013  . Low back pain 03/05/2013    Marquite Attwood 09/10/2019, 1:34 PM  [facility] [address] [place], [place], [address] Phone: [phone]   Fax:  [phone]  Name: JADELYNN BOYLAN MRN: [id number] Date of Birth: 1966/01/28

## 2019-09-10 NOTE — Therapy (Signed)
[facility] [address] [place], [place], [address] Phone: [phone]   Fax:  [phone]  Physical Therapy Treatment  Patient Details  Name: Tara [name] MRN: [id number] Date of Birth: August 17, 1965 No data recorded  Encounter Date: 09/10/2019  PT End of Session - 09/10/19 1121    Visit Number  8    Number of Visits  13    Authorization Type  [facility]    PT Start Time  2111    PT Stop Time  1015    PT Time Calculation (min)  40 min    Equipment Utilized During Treatment  Gait belt    Activity Tolerance  Patient tolerated treatment well   limited by dizziness   Behavior During Therapy  WFL for tasks assessed/performed   Guarded with gait      Past Medical History:  Diagnosis Date  . Asthma   . Hyperlipidemia   . Migraine   . Rotator cuff tear   . Spondylolisthesis    L4-5    Past Surgical History:  Procedure Laterality Date  . BUBBLE STUDY  07/30/2019   Procedure: BUBBLE STUDY;  Surgeon: Donato Heinz, MD;  Location: [place];  Service: Cardiovascular;;  . LOOP RECORDER INSERTION N/A 07/30/2019   Procedure: LOOP RECORDER INSERTION;  Surgeon: Evans Lance, MD;  Location: [place] CV LAB;  Service: Cardiovascular;  Laterality: N/A;  . No prior surgery    . TEE WITHOUT CARDIOVERSION N/A 07/30/2019   Procedure: TRANSESOPHAGEAL ECHOCARDIOGRAM (TEE);  Surgeon: Donato Heinz, MD;  Location: [facility] ENDOSCOPY;  Service: Cardiovascular;  Laterality: N/A;    There were no vitals filed for this visit.  Subjective Assessment - 09/10/19 0936    Subjective  Feels like dizziness is improving. Yesterday was dizzy walking towards the bathroom in the morning when getting out of bed - was looking down to find toothpaste. When she stood up the dizziness was a 3 and it did not resolve quickly. Exercises are going well at home. No dizziness right now.    Patient Stated Goals  Pt's goals for therapy are  to strengthen to help with the tired feeling.    Currently in Pain?  Yes    Pain Score  2     Pain Location  Arm    Pain Orientation  Right    Pain Descriptors / Indicators  Aching    Pain Onset  1 to 4 weeks ago         [facility] PT Assessment - 09/10/19 0001      Dynamic Gait Index   Level Surface  Mild Impairment    Change in Gait Speed  Moderate Impairment    Gait with Horizontal Head Turns  Moderate Impairment    Gait with Vertical Head Turns  Moderate Impairment    Gait and Pivot Turn  Moderate Impairment    Step Over Obstacle  Moderate Impairment    Step Around Obstacles  Moderate Impairment    Steps  Mild Impairment    Total Score  10    DGI comment:  10/24                   [facility] Adult PT Treatment/Exercise - 09/10/19 0001      Ambulation/Gait   Gait Comments  pt performing slower turns when performing DGI - reporting 1-2/10 dizziness      Therapeutic Activites    Therapeutic Activities  Other Therapeutic Activities  Other Therapeutic Activities  Reviewed fall prevention and signs/symptoms of a CVA - with pt able to verbalize understanding with no verbal cues. Continued to educate pt on importance of movement in daily activities for her body to get used to increased motion - especially with head motions vs. pt turning her whole body. Further discussed purpose of habituation exercises.       Neuro Re-ed    Neuro Re-ed Details   Quarter turns with BUE support then single UE support on chair anteriorly, performing more slowly and then cues to gradually incr speed x8 reps B. 1-2/10 dizziness that subsides within approx.. 10-15 seconds      Knee/Hip Exercises: Aerobic   Nustep  at gear 4 with BLE and BUE for ROM, activity tolerance, strengthening x5 minutes      Vestibular Treatment/Exercise - 09/10/19 0001      Vestibular Treatment/Exercise   Habituation Exercises  Standing Horizontal Head Turns;Standing Vertical Head Turns      Standing Horizontal Head  Turns   Number of Reps   5   x4   Symptom Description   2 with BUE support, 1 with single UE support, 1 with no UE support - reporting 1/10 swimmy headed - cues to incr speed      Standing Vertical Head Turns   Number of Reps   5   x2   Symptom Description   with BUE support, reporting 2/10 dizziness - cues to incr speed            PT Education - 09/10/19 1121    Education Details  see TA, continue with current HEP    Person(s) Educated  Patient    Methods  Explanation    Comprehension  Verbalized understanding       PT Short Term Goals - 09/10/19 0944      PT SHORT TERM GOAL #1   Title  Pt will be independent with HEP for improved strength, balance, and gait.  TARGET for all STGs:  4 weeks:  09/05/2019    Time  4    Period  Weeks    Status  Achieved      PT SHORT TERM GOAL #2   Title  Pt will improve 5x sit<>stand to less than or equal to 15 seconds for improved functional lower extremity strength.    Baseline  21.68 seconds on 09/03/19    Time  4    Period  Weeks    Status  Not Met      PT SHORT TERM GOAL #3   Title  Pt will improve DGI score to at least 16/24 for decresaed fall risk.    Baseline  10/24 on 09/10/19    Time  4    Period  Weeks    Status  Not Met      PT SHORT TERM GOAL #4   Title  Pt will verbalize understanding of:  fall prevention in home environment and CVA warning signs/symptoms    Time  4    Period  Weeks    Status  Achieved        PT Long Term Goals - 08/08/19 1618      PT LONG TERM GOAL #1   Title  Pt will be independent with progression of HEP to address strength, balance, gait.  TARGET 09/19/2019 (may be delayed due to scheduling)    Time  6    Period  Weeks    Status  New  PT LONG TERM GOAL #2   Title  Pt will improve 5x sit<>stand to less than or equal to 12 seconds for improved functional strength.    Time  6    Period  Weeks    Status  New      PT LONG TERM GOAL #3   Title  Pt will improve DGI score to at least 19/24  for decreased fall risk.    Time  6    Period  Weeks    Status  New      PT LONG TERM GOAL #4   Title  Pt will ambulate at least 1000 ft, indoor and outdoor surfaces, modified independently no LOB for return to independent community gait.    Time  6    Period  Weeks    Status  New            Plan - 09/10/19 1210    Clinical Impression Statement  Further assessed pt's STGs today. Pt scoring a 10/24 on the DGI today indicating that pt is a high fall risk. Pt guarded with all movement and after head motions and performing slow turns pt reports dizziness as a 2/10. When performing stairs pt does not look down due to fear of movement and dizziness. At most throughout session pt reports dizziness at 1-2/10 that subsides approx. 30 seconds later after fixating on a target. Continued to educate on importance of movement and habituation. Will continue to progress towards LTGs.    Personal Factors and Comorbidities  Comorbidity 3+    Comorbidities  PMH:  asthma, hyperlipidemia, spondyloslisthesis, R RTC tear    Examination-Activity Limitations  Locomotion Level;Transfers;Stand;Stairs    Examination-Participation Restrictions  Community Activity;Yard Work;Other   walking for exercise in neighborhood, return to work   Stability/Clinical Decision Making  Evolving/Moderate complexity    Rehab Potential  Good    PT Frequency  2x / week    PT Duration  6 weeks   plus eval week; total POC = 7 weeks   PT Treatment/Interventions  ADLs/Self Care Home Management;Gait training;Stair training;Functional mobility training;Therapeutic activities;Therapeutic exercise;Balance training;Neuromuscular re-education;DME Instruction;Patient/family education;Electrical Stimulation;Orthotic Fit/Training    PT Next Visit Plan  MRI will be done on 4/20.  gait with head motions/turns. practice turning. review habituation exercises. review and progress VOR x1 if appropriate.  corner balance with narrow BOS and eyes closed  on level ground. try standing balance eyes open with head turns.    Consulted and Agree with Plan of Care  Patient       Patient will benefit from skilled therapeutic intervention in order to improve the following deficits and impairments:  Abnormal gait, Difficulty walking, Decreased endurance, Decreased balance, Decreased activity tolerance, Decreased strength, Decreased mobility  Visit Diagnosis: Muscle weakness (generalized)  Other lack of coordination  Unsteadiness on feet  Other abnormalities of gait and mobility     Problem List Patient Active Problem List   Diagnosis Date Noted  . CVA (cerebral vascular accident) ([facility] [place]) 07/29/2019  . Dysphagia 07/29/2019  . Left hand weakness 07/29/2019  . Palpitations 09/15/2015  . Diastolic dysfunction 62/26/3335  . Renal cyst, left 06/19/2013  . History of migraine headaches 03/05/2013  . Complicated migraine 45/62/5638  . HLD (hyperlipidemia) 03/05/2013  . 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 Rotator cuff syndrome 03/05/2013  . Low back pain 03/05/2013    Tara [name], PT, DPT  09/10/2019, 12:13 PM  [facility] [address] [place], [place],  [address] Phone: [phone]   Fax:  [phone]  Name: Tara [name] MRN: [id number] Date of Birth: 04/13/1966

## 2019-09-12 ENCOUNTER — Ambulatory Visit: Payer: 59 | Admitting: Physical Therapy

## 2019-09-12 ENCOUNTER — Other Ambulatory Visit: Payer: Self-pay

## 2019-09-12 ENCOUNTER — Ambulatory Visit: Payer: 59 | Admitting: Occupational Therapy

## 2019-09-12 DIAGNOSIS — M6281 Muscle weakness (generalized): Secondary | ICD-10-CM

## 2019-09-12 DIAGNOSIS — R2689 Other abnormalities of gait and mobility: Secondary | ICD-10-CM | POA: Diagnosis not present

## 2019-09-12 DIAGNOSIS — R278 Other lack of coordination: Secondary | ICD-10-CM | POA: Diagnosis not present

## 2019-09-12 DIAGNOSIS — R2681 Unsteadiness on feet: Secondary | ICD-10-CM | POA: Diagnosis not present

## 2019-09-12 DIAGNOSIS — I69318 Other symptoms and signs involving cognitive functions following cerebral infarction: Secondary | ICD-10-CM | POA: Diagnosis not present

## 2019-09-12 DIAGNOSIS — R42 Dizziness and giddiness: Secondary | ICD-10-CM | POA: Diagnosis not present

## 2019-09-12 NOTE — Therapy (Signed)
[facility] [address] [place], [place], [address] Phone: [phone]   Fax:  [phone]  Occupational Therapy Treatment  Patient Details  Name: Tara [name] MRN: [id number] Date of Birth: 28-Oct-1965 No data recorded  Encounter Date: 09/12/2019  OT End of Session - 09/12/19 1049    Visit Number  5    Number of Visits  13    Date for OT Re-Evaluation  09/26/19    Authorization Type  [facility] [employer]    OT Start Time  1027    OT Stop Time  1100    OT Time Calculation (min)  33 min    Activity Tolerance  Patient tolerated treatment well    Behavior During Therapy  [place] for tasks assessed/performed       Past Medical History:  Diagnosis Date  . Asthma   . Hyperlipidemia   . Migraine   . Rotator cuff tear   . Spondylolisthesis    L4-5    Past Surgical History:  Procedure Laterality Date  . BUBBLE STUDY  07/30/2019   Procedure: BUBBLE STUDY;  Surgeon: Donato Heinz, MD;  Location: [facility];  Service: Cardiovascular;;  . LOOP RECORDER INSERTION N/A 07/30/2019   Procedure: LOOP RECORDER INSERTION;  Surgeon: Evans Lance, MD;  Location: [facility];  Service: Cardiovascular;  Laterality: N/A;  . No prior surgery    . TEE WITHOUT CARDIOVERSION N/A 07/30/2019   Procedure: TRANSESOPHAGEAL ECHOCARDIOGRAM (TEE);  Surgeon: Donato Heinz, MD;  Location: [facility] ENDOSCOPY;  Service: Cardiovascular;  Laterality: N/A;    There were no vitals filed for this visit.  Subjective Assessment - 09/12/19 1030    Subjective   Pt reports dizziness from bending to tie shoes    Currently in Pain?  Yes    Pain Score  2     Pain Location  Elbow    Pain Orientation  Right    Pain Descriptors / Indicators  Aching    Pain Type  Acute pain    Pain Onset  1 to 4 weeks ago    Pain Frequency  Intermittent    Aggravating Factors   overuse    Pain Relieving Factors  ice             Treatment: Typing test 11  wpm, 73% accuracy, followed by typing activities for increased speed and accuracy,  Environmental scanning to locate playing cards in sequential order, pt required min v.c for scanning(She missed approx 4-5 cards on the first pass) and 1 seated rest break due to increased dizziness to 3/10. Functional reaching to place and remove graded clothespins from vertical antennae with LUE for sustained pinch and functional reach.                OT Short Term Goals - 09/04/19 1339      OT SHORT TERM GOAL #1   Title  Independent with HEP for LUE strengthening (shoulder and hand)    Time  3    Period  Weeks    Status  New      OT SHORT TERM GOAL #2   Title  Grip strength Lt hand to be 45 lbs or greater    Baseline  35 lbs (Rt = 56)    Time  3    Period  Weeks    Status  New      OT SHORT TERM GOAL #3   Title  Pt will increase typing speed  to 20 wpm, 90% accuracy in prep for return to work    Time  3    Period  Weeks    Status  New      OT SHORT TERM GOAL #4   Title  Pt to perform environmental scanning w/ head turns and no LOB, min dizziness w/ 90% accuracy    Time  3    Period  Weeks    Status  New        OT Long Term Goals - 09/03/19 1547      OT LONG TERM GOAL #1   Title  Pt to report less drops Lt hand t/o day    Time  6    Period  Weeks    Status  New      OT LONG TERM GOAL #2   Title  Pt to perform cleaning tasks for 20 min w/o rest mod I level    Time  6    Period  Weeks    Status  New      OT LONG TERM GOAL #3   Title  Pt to perform full cooking tasks mod I level safely    Time  6    Period  Weeks    Status  New      OT LONG TERM GOAL #4   Title  Pt to simulate work tasks w/ extra time as required    Time  6    Period  Weeks    Status  New            Plan - 09/12/19 1031    Clinical Impression Statement  Pt reports dizziness after tying her shoes today. Pt forgot to bring her leg up, she bent down to tie tham.    OT Occupational Profile  and History  Problem Focused Assessment - Including review of records relating to presenting problem    Occupational performance deficits (Please refer to evaluation for details):  IADL's;Work;Leisure    Rehab Potential  Good    Clinical Decision Making  Limited treatment options, no task modification necessary    Comorbidities Affecting Occupational Performance:  May have comorbidities impacting occupational performance    Modification or Assistance to Complete Evaluation   No modification of tasks or assist necessary to complete eval    OT Frequency  2x / week    OT Duration  6 weeks   plus eval   OT Treatment/Interventions  Self-care/ADL training;Therapeutic exercise;Functional Mobility Training;Neuromuscular education;Manual Therapy;Therapeutic activities;Coping strategies training;DME and/or AE instruction;Cognitive remediation/compensation;Visual/perceptual remediation/compensation;Moist Heat;Passive range of motion;Patient/family education    Plan  Environmental scanning, functional use of LUE    Recommended Other Services  will monitor for need for speech therapy services    Consulted and Agree with Plan of Care  Patient       Patient will benefit from skilled therapeutic intervention in order to improve the following deficits and impairments:           Visit Diagnosis: Muscle weakness (generalized)  Other lack of coordination    Problem List Patient Active Problem List   Diagnosis Date Noted  . CVA (cerebral vascular accident) (HCC) 07/29/2019  . Dysphagia 07/29/2019  . Left hand weakness 07/29/2019  . Palpitations 09/15/2015  . Diastolic dysfunction 09/15/2015  . Renal cyst, left 06/19/2013  . History of migraine headaches 03/05/2013  . 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 HLD (hyperlipidemia) 03/05/2013  . 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 Rotator cuff syndrome 03/05/2013  . Low  back pain 03/05/2013    Jemaine Prokop 09/12/2019, 11:06 AM  [facility] [address] [place], [place], [address] Phone: [phone]   Fax:  [phone]  Name: STARLETTA HOUCHIN MRN: [id number] Date of Birth: 03-24-1966

## 2019-09-12 NOTE — Therapy (Signed)
[facility] [address] [place], [place], [address] Phone: [phone]   Fax:  [phone]  Physical Therapy Treatment  Patient Details  Name: Tara [name] MRN: [id number] Date of Birth: 10/02/1965 No data recorded  Encounter Date: 09/12/2019  PT End of Session - 09/12/19 1259    Visit Number  9    Number of Visits  13    Authorization Type  [facility]    PT Start Time  1102    PT Stop Time  1147    PT Time Calculation (min)  45 min    Equipment Utilized During Treatment  Gait belt    Activity Tolerance  Patient tolerated treatment well   limited by dizziness   Behavior During Therapy  WFL for tasks assessed/performed       Past Medical History:  Diagnosis Date  . Asthma   . Hyperlipidemia   . Migraine   . Rotator cuff tear   . Spondylolisthesis    L4-5    Past Surgical History:  Procedure Laterality Date  . BUBBLE STUDY  07/30/2019   Procedure: BUBBLE STUDY;  Surgeon: Donato Heinz, MD;  Location: [facility];  Service: Cardiovascular;;  . LOOP RECORDER INSERTION N/A 07/30/2019   Procedure: LOOP RECORDER INSERTION;  Surgeon: Evans Lance, MD;  Location: [facility];  Service: Cardiovascular;  Laterality: N/A;  . No prior surgery    . TEE WITHOUT CARDIOVERSION N/A 07/30/2019   Procedure: TRANSESOPHAGEAL ECHOCARDIOGRAM (TEE);  Surgeon: Donato Heinz, MD;  Location: [facility] ENDOSCOPY;  Service: Cardiovascular;  Laterality: N/A;    There were no vitals filed for this visit.  Subjective Assessment - 09/12/19 1104    Subjective  3/10 dizziness after OT. Nothing new since she was last here. Exercises are going ok at home. Dizziness comes and goes.    Patient Stated Goals  Pt's goals for therapy are to strengthen to help with the tired feeling.    Currently in Pain?  Yes    Pain Score  2     Pain Location  Elbow    Pain Orientation  Right    Pain Descriptors / Indicators  Aching    Pain Onset  1 to 4 weeks ago                       [facility] Adult PT Treatment/Exercise - 09/12/19 0001      Ambulation/Gait   Ambulation/Gait  Yes    Ambulation/Gait Assistance  5: Supervision;4: Min guard    Ambulation/Gait Assistance Details  therapist placed 8 cones (x2 times)around therapy gym for pt to scan environment and then bend down to pick cones up, cues for proper squat technique, pt needing to hold onto objects at times to bend down and pick up - cues for neck flexion and looking towards object when bending down to pick it up as pt has tendency to just look straight forward. cues for turning head vs. body when scanning environment. when pt picking up cone had pt turn head to R/L to reach and hand cone to therapist. when ambulating around gym, pt at times reaching out for objects/the wall to hold onto for balance. minimal symptoms noted when performing     Ambulation Distance (Feet)  230 Feet    Assistive device  None    Gait Pattern  Step-through pattern;Decreased arm swing - right;Decreased arm swing - left;Decreased step length - left;Decreased step length -  right;Decreased stride length;Decreased trunk rotation    Ambulation Surface  Level;Indoor      Neuro Re-ed    Neuro Re-ed Details   Standing in corner on pillow for compliant surface: with wide BOS - 3 x 5 reps head turns with cues for increased speed and following eyes with head, pt reporting 2-3/10 dizziness after each rep with occasional needing to catch self on the wall for balance, needed intermittent standing break to let symptoms settle before continuing. Performed with 3 x 5 reps head nods - with cues for increasing speed, 2-3/10 dizzinesss. Pt needing seated rest break after activities before walking back to tx mat at end of session.       Vestibular Treatment/Exercise - 09/12/19 0001      X1 Viewing Horizontal   Foot Position  seated in chair with back support    Reps  10   x3   Comments  cues to  keep a chin tuck       X1 Viewing Vertical   Foot Position  seated in chair with back support    Reps  5   x3   Comments  x1 reps slowly 1/10 dizziness, 2 reps of trying to gently incr speed 2/10 dizziness            PT Education - 09/12/19 1258    Education Details  increasing speed with VOR x1 exercises in sitting for HEP    Person(s) Educated  Patient    Methods  Explanation;Demonstration    Comprehension  Returned demonstration;Verbalized understanding       PT Short Term Goals - 09/10/19 0944      PT SHORT TERM GOAL #1   Title  Pt will be independent with HEP for improved strength, balance, and gait.  TARGET for all STGs:  4 weeks:  09/05/2019    Time  4    Period  Weeks    Status  Achieved      PT SHORT TERM GOAL #2   Title  Pt will improve 5x sit<>stand to less than or equal to 15 seconds for improved functional lower extremity strength.    Baseline  21.68 seconds on 09/03/19    Time  4    Period  Weeks    Status  Not Met      PT SHORT TERM GOAL #3   Title  Pt will improve DGI score to at least 16/24 for decresaed fall risk.    Baseline  10/24 on 09/10/19    Time  4    Period  Weeks    Status  Not Met      PT SHORT TERM GOAL #4   Title  Pt will verbalize understanding of:  fall prevention in home environment and CVA warning signs/symptoms    Time  4    Period  Weeks    Status  Achieved        PT Long Term Goals - 08/08/19 1618      PT LONG TERM GOAL #1   Title  Pt will be independent with progression of HEP to address strength, balance, gait.  TARGET 09/19/2019 (may be delayed due to scheduling)    Time  6    Period  Weeks    Status  New      PT LONG TERM GOAL #2   Title  Pt will improve 5x sit<>stand to less than or equal to 12 seconds for improved functional strength.    Time  6    Period  Weeks    Status  New      PT LONG TERM GOAL #3   Title  Pt will improve DGI score to at least 19/24 for decreased fall risk.    Time  6    Period  Weeks     Status  New      PT LONG TERM GOAL #4   Title  Pt will ambulate at least 1000 ft, indoor and outdoor surfaces, modified independently no LOB for return to independent community gait.    Time  6    Period  Weeks    Status  New            Plan - 09/12/19 1300    Clinical Impression Statement  Pt with increased dizziness of 3/10 at start of session after scanning activities with prior OT session. Needed prolonged seated rest break with cues for deep breathing before able to participate in PT session. Able to slightly incr speed with seated VOR exercises with pt reporting minimal symptoms, upgraded to HEP. Performed scanning around gym looking and bending down to pick up cones - needing to hold on intermittently to objects/wall for balance, needs cues to scan environment with head turns. Continued to educate pt of importance of movement at home with daily activities. Will continue to progress towards LTGs.    Personal Factors and Comorbidities  Comorbidity 3+    Comorbidities  PMH:  asthma, hyperlipidemia, spondyloslisthesis, R RTC tear    Examination-Activity Limitations  Locomotion Level;Transfers;Stand;Stairs    Examination-Participation Restrictions  Community Activity;Yard Work;Other   walking for exercise in neighborhood, return to work   Stability/Clinical Decision Making  Evolving/Moderate complexity    Rehab Potential  Good    PT Frequency  2x / week    PT Duration  6 weeks   plus eval week; total POC = 7 weeks   PT Treatment/Interventions  ADLs/Self Care Home Management;Gait training;Stair training;Functional mobility training;Therapeutic activities;Therapeutic exercise;Balance training;Neuromuscular re-education;DME Instruction;Patient/family education;Electrical Stimulation;Orthotic Fit/Training    PT Next Visit Plan  MRI will be done on 4/20.  standing balance on compliant surface - eyes open with head motions. gait with head motions/turns - scanning environments. practice  turning. review habituation exercises. review and progress VOR x1 if appropriate.  corner balance with narrow BOS and eyes closed on level ground.    Consulted and Agree with Plan of Care  Patient       Patient will benefit from skilled therapeutic intervention in order to improve the following deficits and impairments:  Abnormal gait, Difficulty walking, Decreased endurance, Decreased balance, Decreased activity tolerance, Decreased strength, Decreased mobility  Visit Diagnosis: Muscle weakness (generalized)  Other lack of coordination  Unsteadiness on feet  Other abnormalities of gait and mobility     Problem List Patient Active Problem List   Diagnosis Date Noted  . CVA (cerebral vascular accident) ([facility]) 07/29/2019  . Dysphagia 07/29/2019  . Left hand weakness 07/29/2019  . Palpitations 09/15/2015  . Diastolic dysfunction 07/62/2633  . Renal cyst, left 06/19/2013  . History of migraine headaches 03/05/2013  . Complicated migraine 35/45/6256  . HLD (hyperlipidemia) 03/05/2013  . 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 Rotator cuff syndrome 03/05/2013  . Low back pain 03/05/2013    Arliss Journey, PT, DPT  09/12/2019, 1:12 PM  [facility] [address] [place], [place], [address] Phone: [phone]   Fax:  [phone]  Name: ZIONAH CRISWELL MRN: [id number]  Date of Birth: Jul 18, 1965

## 2019-09-16 ENCOUNTER — Other Ambulatory Visit: Payer: Self-pay

## 2019-09-16 ENCOUNTER — Ambulatory Visit (HOSPITAL_COMMUNITY)
Admission: RE | Admit: 2019-09-16 | Discharge: 2019-09-16 | Disposition: A | Discharge status: Home / Self Care | Payer: 59 | Source: Ambulatory Visit | Attending: Adult Health | Admitting: Adult Health

## 2019-09-16 DIAGNOSIS — H539 Unspecified visual disturbance: Secondary | ICD-10-CM | POA: Insufficient documentation

## 2019-09-16 DIAGNOSIS — R29818 Other symptoms and signs involving the nervous system: Secondary | ICD-10-CM | POA: Insufficient documentation

## 2019-09-16 DIAGNOSIS — H53129 Transient visual loss, unspecified eye: Secondary | ICD-10-CM | POA: Diagnosis not present

## 2019-09-17 ENCOUNTER — Ambulatory Visit: Payer: 59 | Admitting: Occupational Therapy

## 2019-09-17 ENCOUNTER — Ambulatory Visit: Payer: 59 | Admitting: Physical Therapy

## 2019-09-17 ENCOUNTER — Telehealth: Payer: Self-pay | Admitting: *Deleted

## 2019-09-17 DIAGNOSIS — M6281 Muscle weakness (generalized): Secondary | ICD-10-CM | POA: Diagnosis not present

## 2019-09-17 DIAGNOSIS — R2681 Unsteadiness on feet: Secondary | ICD-10-CM | POA: Diagnosis not present

## 2019-09-17 DIAGNOSIS — I69318 Other symptoms and signs involving cognitive functions following cerebral infarction: Secondary | ICD-10-CM | POA: Diagnosis not present

## 2019-09-17 DIAGNOSIS — R278 Other lack of coordination: Secondary | ICD-10-CM | POA: Diagnosis not present

## 2019-09-17 DIAGNOSIS — R42 Dizziness and giddiness: Secondary | ICD-10-CM | POA: Diagnosis not present

## 2019-09-17 DIAGNOSIS — R2689 Other abnormalities of gait and mobility: Secondary | ICD-10-CM | POA: Diagnosis not present

## 2019-09-17 NOTE — Therapy (Signed)
[facility] [address] [place], [place], [address] Phone: [phone]   Fax:  [phone]  Physical Therapy Treatment  Patient Details  Name: Tara [name] MRN: [id number] Date of Birth: 25-May-1966 No data recorded  Encounter Date: 09/17/2019  PT End of Session - 09/17/19 1022    Visit Number  10    Number of Visits  13    Authorization Type  [facility]    PT Start Time  0933    PT Stop Time  1015    PT Time Calculation (min)  42 min    Equipment Utilized During Treatment  Gait belt    Activity Tolerance  Patient tolerated treatment well   limited by dizziness   Behavior During Therapy  WFL for tasks assessed/performed       Past Medical History:  Diagnosis Date  . Asthma   . Hyperlipidemia   . Migraine   . Rotator cuff tear   . Spondylolisthesis    L4-5    Past Surgical History:  Procedure Laterality Date  . BUBBLE STUDY  07/30/2019   Procedure: BUBBLE STUDY;  Surgeon: Donato Heinz, MD;  Location: [facility];  Service: Cardiovascular;;  . LOOP RECORDER INSERTION N/A 07/30/2019   Procedure: LOOP RECORDER INSERTION;  Surgeon: Evans Lance, MD;  Location: [facility];  Service: Cardiovascular;  Laterality: N/A;  . No prior surgery    . TEE WITHOUT CARDIOVERSION N/A 07/30/2019   Procedure: TRANSESOPHAGEAL ECHOCARDIOGRAM (TEE);  Surgeon: Donato Heinz, MD;  Location: [facility] ENDOSCOPY;  Service: Cardiovascular;  Laterality: N/A;    There were no vitals filed for this visit.  Subjective Assessment - 09/17/19 0937    Subjective  Just did her exercises this morning. Has some dizziness, but it doesn't occur so often. Now going up and down the stairs more at home. Starting out today at 0/10 dizziness    Patient Stated Goals  Pt's goals for therapy are to strengthen to help with the tired feeling.    Currently in Pain?  Yes    Pain Score  2     Pain Location  Elbow   forearm   Pain  Orientation  Right    Pain Descriptors / Indicators  Aching    Pain Onset  1 to 4 weeks ago    Aggravating Factors   gripping is when she feels it, carrying    Pain Relieving Factors  not moving it                       [facility] Adult PT Treatment/Exercise - 09/17/19 0001      Ambulation/Gait   Ambulation/Gait  Yes    Ambulation/Gait Assistance  5: Supervision;4: Min guard    Ambulation/Gait Assistance Details  throughout gym asking pt to scan and read/find different objects in environment with cues to turn head vs. body when scanning, reporting 1/10 dizziness, more guarded during gait    Ambulation Distance (Feet)  300 Feet    Assistive device  None    Gait Pattern  Step-through pattern;Decreased arm swing - right;Decreased arm swing - left;Decreased step length - left;Decreased step length - right;Decreased stride length;Decreased trunk rotation    Ambulation Surface  Level;Indoor    Stairs  Yes    Stairs Assistance  5: Supervision;4: Min guard    Stairs Assistance Details (indicate cue type and reason)  min guard when descending, with pt looking down at foot  on stair (previously when assessed pt not looking down when descending), after 1st and 2nd rep pt reporting 1-2/10 dizziness, after 3rd rep reported 2/10 and needed seated rest break due to also feeling incr nausea (which subsided)    Stair Management Technique  Two rails;Alternating pattern;Forwards    Number of Stairs  12    Height of Stairs  6       NMR:  -Seated habituation reviewed from HEP- cues to perform chin tuck prior to performing exercise: Going towards R knee: 1/10 dizziness with nose to knee and then back up to sitting 1/10, 2nd rep performing with a slight increase in speed with 2/10 dizziness,  1/10 when coming back up to sitting.  Going towards L knee: 2/10 dizziness whith nose to knee and then back up to sitting 1/10, 2nd rep performing with a slight increase in speed with 1/10 dizziness,  1/10 when  coming back up to sitting. -educated on performing at home with a slight incr in speed.   -Standing at countertop: 180 degree turns (broken up to quarter turn and then to 180 degrees) x5 reps B, with cues for slightly incr speed during last 2 reps, at most 1/10 dizziness with pt also reporting feeling "swimmyheaded" afterwards and needed standing rest break with UE support on counter to maintain balance      PT Education - 09/17/19 1021    Education Details  reviewed seated habituation exercies for HEP - reviewed technique and cues to slightly incr speed    Person(s) Educated  Patient    Methods  Explanation;Demonstration    Comprehension  Verbalized understanding;Returned demonstration       PT Short Term Goals - 09/10/19 0944      PT SHORT TERM GOAL #1   Title  Pt will be independent with HEP for improved strength, balance, and gait.  TARGET for all STGs:  4 weeks:  09/05/2019    Time  4    Period  Weeks    Status  Achieved      PT SHORT TERM GOAL #2   Title  Pt will improve 5x sit<>stand to less than or equal to 15 seconds for improved functional lower extremity strength.    Baseline  21.68 seconds on 09/03/19    Time  4    Period  Weeks    Status  Not Met      PT SHORT TERM GOAL #3   Title  Pt will improve DGI score to at least 16/24 for decresaed fall risk.    Baseline  10/24 on 09/10/19    Time  4    Period  Weeks    Status  Not Met      PT SHORT TERM GOAL #4   Title  Pt will verbalize understanding of:  fall prevention in home environment and CVA warning signs/symptoms    Time  4    Period  Weeks    Status  Achieved        PT Long Term Goals - 08/08/19 1618      PT LONG TERM GOAL #1   Title  Pt will be independent with progression of HEP to address strength, balance, gait.  TARGET 09/19/2019 (may be delayed due to scheduling)    Time  6    Period  Weeks    Status  New      PT LONG TERM GOAL #2   Title  Pt will improve 5x sit<>stand to less than or  equal to 12  seconds for improved functional strength.    Time  6    Period  Weeks    Status  New      PT LONG TERM GOAL #3   Title  Pt will improve DGI score to at least 19/24 for decreased fall risk.    Time  6    Period  Weeks    Status  New      PT LONG TERM GOAL #4   Title  Pt will ambulate at least 1000 ft, indoor and outdoor surfaces, modified independently no LOB for return to independent community gait.    Time  6    Period  Weeks    Status  New            Plan - 09/17/19 1028    Clinical Impression Statement  Pt had MRI yesterday - awaiting results. Reviewed seated habituation exercises with cues to perform proper chin tuck and progressed HEP to slightly increase the speed when bringing nose towards knee. Performed gait with scanning around gym with needing cues to perform head turns as pt still with tendency to perform whole body to turn. Had increased 2/10 dizziness after descending stairs while also looking down to place foot on step. Pt needed intermittent standing and seated rest breaks throughout session to wait for dizziness to subside. Will continue to progress towards LTGs.    Personal Factors and Comorbidities  Comorbidity 3+    Comorbidities  PMH:  asthma, hyperlipidemia, spondyloslisthesis, R RTC tear    Examination-Activity Limitations  Locomotion Level;Transfers;Stand;Stairs    Examination-Participation Restrictions  Community Activity;Yard Work;Other   walking for exercise in neighborhood, return to work   Stability/Clinical Decision Making  Evolving/Moderate complexity    Rehab Potential  Good    PT Frequency  2x / week    PT Duration  6 weeks   plus eval week; total POC = 7 weeks   PT Treatment/Interventions  ADLs/Self Care Home Management;Gait training;Stair training;Functional mobility training;Therapeutic activities;Therapeutic exercise;Balance training;Neuromuscular re-education;DME Instruction;Patient/family education;Electrical Stimulation;Orthotic Fit/Training     PT Next Visit Plan  MRI will be done on 4/20.  standing balance on compliant surface - eyes open with head motions. eyes closed balance on compliant surface? gait with head motions/turns - scanning environments. practice turning. review habituation exercises. review and progress VOR x1 if appropriate.    Consulted and Agree with Plan of Care  Patient       Patient will benefit from skilled therapeutic intervention in order to improve the following deficits and impairments:  Abnormal gait, Difficulty walking, Decreased endurance, Decreased balance, Decreased activity tolerance, Decreased strength, Decreased mobility  Visit Diagnosis: Other lack of coordination  Muscle weakness (generalized)  Unsteadiness on feet  Other abnormalities of gait and mobility     Problem List Patient Active Problem List   Diagnosis Date Noted  . CVA (cerebral vascular accident) ([place]) 07/29/2019  . Dysphagia 07/29/2019  . Left hand weakness 07/29/2019  . Palpitations 09/15/2015  . Diastolic dysfunction 09/32/6712  . Renal cyst, left 06/19/2013  . History of migraine headaches 03/05/2013  . Complicated migraine 45/80/9983  . HLD (hyperlipidemia) 03/05/2013  . 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 Rotator cuff syndrome 03/05/2013  . Low back pain 03/05/2013    Arliss Journey, PT, DPT  09/17/2019, 10:30 AM  [facility] [address] [place], [place], [address] Phone: [phone]   Fax:  [phone]  Name: Tara [name] MRN: [id number]  Date of Birth: Jul 18, 1965

## 2019-09-17 NOTE — Therapy (Signed)
[facility] [address] [place], [place], [address] Phone: [phone]   Fax:  [phone]  Occupational Therapy Treatment  Patient Details  Name: Tara [name] MRN: [id number] Date of Birth: May 13, 1966 No data recorded  Encounter Date: 09/17/2019  OT End of Session - 09/17/19 1219    Visit Number  6    Number of Visits  13    Date for OT Re-Evaluation  09/26/19    Authorization Type  [facility]    OT Start Time  1015    OT Stop Time  1105    OT Time Calculation (min)  50 min    Activity Tolerance  Patient tolerated treatment well    Behavior During Therapy  [facility] for tasks assessed/performed       Past Medical History:  Diagnosis Date  . Asthma   . Hyperlipidemia   . Migraine   . Rotator cuff tear   . Spondylolisthesis    L4-5    Past Surgical History:  Procedure Laterality Date  . BUBBLE STUDY  07/30/2019   Procedure: BUBBLE STUDY;  Surgeon: Donato Heinz, MD;  Location: [place];  Service: Cardiovascular;;  . LOOP RECORDER INSERTION N/A 07/30/2019   Procedure: LOOP RECORDER INSERTION;  Surgeon: Evans Lance, MD;  Location: [place] CV LAB;  Service: Cardiovascular;  Laterality: N/A;  . No prior surgery    . TEE WITHOUT CARDIOVERSION N/A 07/30/2019   Procedure: TRANSESOPHAGEAL ECHOCARDIOGRAM (TEE);  Surgeon: Donato Heinz, MD;  Location: [facility] ENDOSCOPY;  Service: Cardiovascular;  Laterality: N/A;    There were no vitals filed for this visit.  Subjective Assessment - 09/17/19 1023    Subjective   I got a little dizzy from P.T. (2/10). Pt also reports numbness Lt long and ring fingers up to elbow with use    Pertinent History  CVA 07/29/19. PMH: HLD, migraines, asthma, small chronic cerebellar infarcts    Limitations  fall risk, loop recorder, ? swallowing precautions (chin tuck)    Patient Stated Goals  typing, cooking, cleaning, driving, working    Currently in Pain?  Yes    Pain  Location  Elbow   AND forearm   Pain Orientation  Right    Pain Descriptors / Indicators  Aching    Pain Type  Acute pain    Pain Onset  1 to 4 weeks ago    Pain Frequency  Intermittent    Aggravating Factors   gripping    Pain Relieving Factors  not moving it       Gripper set at level 2 resistance to pick up blocks Lt hand for sustained grip strength.  Environmental scanning finding 12/12 items w/ extra time required d/t slower head turns and anxiety with dizziness.  Pt retrieving items from high cabinet and placing on counter LUE for vertical eye/head turns however pt w/ increased dizziness when looking down - discussed smaller ranges and modified to retrieving from mid level shelf to counter - pt did better with this.  Education on dizziness and gradually increases ranges and/or speeds but beginning with smaller ranges side to side and up/down. Also reviewed visual scanning of target w/o head turns, and VOR ex's. Recommended pt add in saccades b/t 2 targets both side to side and up/down without head turns.                       OT Short Term Goals - 09/17/19 1221  OT SHORT TERM GOAL #1   Title  Independent with HEP for LUE strengthening (shoulder and hand)    Time  3    Period  Weeks    Status  Achieved      OT SHORT TERM GOAL #2   Title  Grip strength Lt hand to be 45 lbs or greater    Baseline  35 lbs (Rt = 56)    Time  3    Period  Weeks    Status  On-going      OT SHORT TERM GOAL #3   Title  Pt will increase typing speed to 20 wpm, 90% accuracy in prep for return to work    Time  3    Period  Weeks    Status  On-going      OT SHORT TERM GOAL #4   Title  Pt to perform environmental scanning w/ head turns and no LOB, min dizziness w/ 90% accuracy    Time  3    Period  Weeks    Status  On-going        OT Long Term Goals - 09/03/19 1547      OT LONG TERM GOAL #1   Title  Pt to report less drops Lt hand t/o day    Time  6    Period  Weeks     Status  New      OT LONG TERM GOAL #2   Title  Pt to perform cleaning tasks for 20 min w/o rest mod I level    Time  6    Period  Weeks    Status  New      OT LONG TERM GOAL #3   Title  Pt to perform full cooking tasks mod I level safely    Time  6    Period  Weeks    Status  New      OT LONG TERM GOAL #4   Title  Pt to simulate work tasks w/ extra time as required    Time  6    Period  Weeks    Status  New            Plan - 09/17/19 1220    Clinical Impression Statement  Pt reports dizziness worse when looking down w/ head movement. Pt limited by dizziness    Occupational performance deficits (Please refer to evaluation for details):  IADL's;Work;Leisure    Rehab Potential  Good    Comorbidities Affecting Occupational Performance:  May have comorbidities impacting occupational performance    OT Frequency  2x / week    OT Duration  6 weeks    OT Treatment/Interventions  Self-care/ADL training;Therapeutic exercise;Functional Mobility Training;Neuromuscular education;Manual Therapy;Therapeutic activities;Coping strategies training;DME and/or AE instruction;Cognitive remediation/compensation;Visual/perceptual remediation/compensation;Moist Heat;Passive range of motion;Patient/family education    Plan  typing games, saccades, smaller range head turns w/ functional tasks    Recommended Other Services  will monitor for need for speech therapy services    Consulted and Agree with Plan of Care  Patient       Patient will benefit from skilled therapeutic intervention in order to improve the following deficits and impairments:           Visit Diagnosis: Muscle weakness (generalized)  Other lack of coordination  Unsteadiness on feet    Problem List Patient Active Problem List   Diagnosis Date Noted  . CVA (cerebral vascular accident) (HCC) 07/29/2019  . Dysphagia 07/29/2019  . Left  hand weakness 07/29/2019  . Palpitations 09/15/2015  . Diastolic dysfunction  09/15/2015  . Renal cyst, left 06/19/2013  . History of migraine headaches 03/05/2013  . 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 HLD (hyperlipidemia) 03/05/2013  . 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 Rotator cuff syndrome 03/05/2013  . Low back pain 03/05/2013    Kelli Churn, OTR/L 09/17/2019, 12:22 PM  [facility] [address] [place], [place], [address] Phone: [phone]   Fax:  [phone]  Name: Tara [name] MRN: [id number] Date of Birth: Apr 28, 1966

## 2019-09-17 NOTE — Telephone Encounter (Addendum)
Spoke with pt & reviewed MRI results. Pt had further questions about the chronic cerebellar infarcts as pt stated she was only aware of a right parietal stroke. Pt aware the imaging results are available for review on mychart. Pt would like a call back. She verbalized appreciation.   ----- Message ----- From: Ihor Austin, NP Sent: 09/17/2019   1:14 PM EDT  Please advise patient that recent imaging did not show acute abnormalities

## 2019-09-17 NOTE — Telephone Encounter (Signed)
Spoke to patient regarding concerns of MRI which she reviewed on MyChart. Advised that no new strokes were found and reported chronic cerebellar infarcts are flow voids and not true strokes. She spoke about ongoing concerns of imbalance and lightheadedness with head movements but endorse improvement and less frequent events as well as cognitive slowing and short term memory concerns. She has not returned back to work at this time due to ongoing deficits but is hopeful to return beginning of May.  She was appreciative of phone call and answered all questions to satisfaction.

## 2019-09-17 NOTE — Telephone Encounter (Signed)
I discussed MRI films in person with you and answered your concerns

## 2019-09-19 ENCOUNTER — Other Ambulatory Visit: Payer: Self-pay

## 2019-09-19 ENCOUNTER — Ambulatory Visit: Payer: 59 | Admitting: Occupational Therapy

## 2019-09-19 ENCOUNTER — Encounter: Payer: Self-pay | Admitting: Occupational Therapy

## 2019-09-19 ENCOUNTER — Ambulatory Visit: Payer: 59 | Admitting: Physical Therapy

## 2019-09-19 DIAGNOSIS — M6281 Muscle weakness (generalized): Secondary | ICD-10-CM | POA: Diagnosis not present

## 2019-09-19 DIAGNOSIS — I69318 Other symptoms and signs involving cognitive functions following cerebral infarction: Secondary | ICD-10-CM | POA: Diagnosis not present

## 2019-09-19 DIAGNOSIS — R278 Other lack of coordination: Secondary | ICD-10-CM

## 2019-09-19 DIAGNOSIS — R2689 Other abnormalities of gait and mobility: Secondary | ICD-10-CM

## 2019-09-19 DIAGNOSIS — R2681 Unsteadiness on feet: Secondary | ICD-10-CM | POA: Diagnosis not present

## 2019-09-19 DIAGNOSIS — R42 Dizziness and giddiness: Secondary | ICD-10-CM | POA: Diagnosis not present

## 2019-09-19 NOTE — Therapy (Signed)
[facility] [address] [place], [place], [address] Phone: [phone]   Fax:  [phone]  Occupational Therapy Treatment  Patient Details  Name: MAIMOUNA RONDEAU MRN: [id number] Date of Birth: 07/14/1965 No data recorded  Encounter Date: 09/19/2019  OT End of Session - 09/19/19 1034    Visit Number  7    Number of Visits  13    Date for OT Re-Evaluation  09/26/19    Authorization Type  [facility]    OT Start Time  1020    OT Stop Time  1100    OT Time Calculation (min)  40 min    Activity Tolerance  Patient tolerated treatment well    Behavior During Therapy  [facility] for tasks assessed/performed       Past Medical History:  Diagnosis Date  . Asthma   . Hyperlipidemia   . Migraine   . Rotator cuff tear   . Spondylolisthesis    L4-5    Past Surgical History:  Procedure Laterality Date  . BUBBLE STUDY  07/30/2019   Procedure: BUBBLE STUDY;  Surgeon: [name], MD;  Location: [facility] ENDOSCOPY;  Service: Cardiovascular;;  . LOOP RECORDER INSERTION N/A 07/30/2019   Procedure: LOOP RECORDER INSERTION;  Surgeon: [name], MD;  Location: [facility] INVASIVE CV LAB;  Service: Cardiovascular;  Laterality: N/A;  . No prior surgery    . TEE WITHOUT CARDIOVERSION N/A 07/30/2019   Procedure: TRANSESOPHAGEAL ECHOCARDIOGRAM (TEE);  Surgeon: [name], MD;  Location: [facility] ENDOSCOPY;  Service: Cardiovascular;  Laterality: N/A;    There were no vitals filed for this visit.  Subjective Assessment - 09/19/19 1023    Subjective   Pt reports arm pain is better    Pertinent History  CVA 07/29/19. PMH: HLD, migraines, asthma, small chronic cerebellar infarcts    Limitations  fall risk, loop recorder, ? swallowing precautions (chin tuck)    Patient Stated Goals  typing, cooking, cleaning, driving, working    Currently in Pain?  Yes    Pain Score  2     Pain Orientation  Right    Pain Descriptors / Indicators  Aching     Pain Type  Acute pain    Pain Onset  1 to 4 weeks ago    Pain Frequency  Intermittent    Aggravating Factors   overuse    Pain Relieving Factors  ice                  Treatment: Typing test 18 wpm and 89% accuracy Typing activities for speed and accuracy with improved performance, dizziness stayed at a 1/10 Table top scanning to perform word search by looking to left at item list then marking out to the right. Gripper set at level 2 for sustained grip, min difficulty. Environmental scanning with small range head turns, min v.c and close supervision.           OT Short Term Goals - 09/19/19 1035      OT SHORT TERM GOAL #1   Title  Independent with HEP for LUE strengthening (shoulder and hand)    Time  3    Period  Weeks    Status  Achieved      OT SHORT TERM GOAL #2   Title  Grip strength Lt hand to be 45 lbs or greater    Baseline  35 lbs (Rt = 56)    Time  3    Period  Weeks    Status  On-going      OT SHORT TERM GOAL #3   Title  Pt will increase typing speed to 20 wpm, 90% accuracy in prep for return to work    Time  3    Period  Weeks    Status  On-going   18 wpm     OT SHORT TERM GOAL #4   Title  Pt to perform environmental scanning w/ head turns and no LOB, min dizziness w/ 90% accuracy    Time  3    Period  Weeks    Status  On-going        OT Long Term Goals - 09/03/19 1547      OT LONG TERM GOAL #1   Title  Pt to report less drops Lt hand t/o day    Time  6    Period  Weeks    Status  New      OT LONG TERM GOAL #2   Title  Pt to perform cleaning tasks for 20 min w/o rest mod I level    Time  6    Period  Weeks    Status  New      OT LONG TERM GOAL #3   Title  Pt to perform full cooking tasks mod I level safely    Time  6    Period  Weeks    Status  New      OT LONG TERM GOAL #4   Title  Pt to simulate work tasks w/ extra time as required    Time  6    Period  Weeks    Status  New            Plan - 09/19/19  1034    Clinical Impression Statement  Pt was dizzy after working with PT today. she continues to progress slowly linmited by significant dizziness.    Occupational performance deficits (Please refer to evaluation for details):  IADL's;Work;Leisure    Rehab Potential  Good    Comorbidities Affecting Occupational Performance:  May have comorbidities impacting occupational performance    OT Frequency  2x / week    OT Duration  6 weeks    OT Treatment/Interventions  Self-care/ADL training;Therapeutic exercise;Functional Mobility Training;Neuromuscular education;Manual Therapy;Therapeutic activities;Coping strategies training;DME and/or AE instruction;Cognitive remediation/compensation;Visual/perceptual remediation/compensation;Moist Heat;Passive range of motion;Patient/family education    Plan  typing, saccades, smaller range head turns w/ functional tasks    Recommended Other Services  will monitor for need for speech therapy services    Consulted and Agree with Plan of Care  Patient       Patient will benefit from skilled therapeutic intervention in order to improve the following deficits and impairments:           Visit Diagnosis: Other lack of coordination  Muscle weakness (generalized)  Unsteadiness on feet    Problem List Patient Active Problem List   Diagnosis Date Noted  . CVA (cerebral vascular accident) ([place]) 07/29/2019  . Dysphagia 07/29/2019  . Left hand weakness 07/29/2019  . Palpitations 09/15/2015  . Diastolic dysfunction 95/28/4132  . Renal cyst, left 06/19/2013  . History of migraine headaches 03/05/2013  . Complicated migraine 44/05/270  . HLD (hyperlipidemia) 03/05/2013  . 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 Rotator cuff syndrome 03/05/2013  . Low back pain 03/05/2013    Burke Terry 09/19/2019, 10:57 AM  [address] [place], [place], [address] Phone: [phone]  Fax:   [phone]  Name: EARSIE HUMM MRN: [id number] Date of Birth: 1965-07-31

## 2019-09-19 NOTE — Therapy (Signed)
[facility] [address] [place], [place], [address] Phone: [phone]   Fax:  [phone]  Physical Therapy Treatment  Patient Details  Name: Tara [name] MRN: [id number] Date of Birth: 07/02/1965 No data recorded  Encounter Date: 09/19/2019  PT End of Session - 09/19/19 1025    Visit Number  11    Number of Visits  13    Authorization Type  [facility]    PT Start Time  [phone]    PT Stop Time  1017   3 minutes non-billable due to put using restroom   PT Time Calculation (min)  41 min    Equipment Utilized During Treatment  Gait belt    Activity Tolerance  Patient tolerated treatment well   limited by dizziness   Behavior During Therapy  [facility] for tasks assessed/performed       Past Medical History:  Diagnosis Date  . Asthma   . Hyperlipidemia   . Migraine   . Rotator cuff tear   . Spondylolisthesis    L4-5    Past Surgical History:  Procedure Laterality Date  . BUBBLE STUDY  07/30/2019   Procedure: BUBBLE STUDY;  Surgeon: Donato Heinz, MD;  Location: [facility];  Service: Cardiovascular;;  . LOOP RECORDER INSERTION N/A 07/30/2019   Procedure: LOOP RECORDER INSERTION;  Surgeon: Evans Lance, MD;  Location: [place] CV LAB;  Service: Cardiovascular;  Laterality: N/A;  . No prior surgery    . TEE WITHOUT CARDIOVERSION N/A 07/30/2019   Procedure: TRANSESOPHAGEAL ECHOCARDIOGRAM (TEE);  Surgeon: Donato Heinz, MD;  Location: [facility] ENDOSCOPY;  Service: Cardiovascular;  Laterality: N/A;    There were no vitals filed for this visit.  Subjective Assessment - 09/19/19 0940    Subjective  The frequency of her dizziness episodes are not as much. The only thing that doesn't stir it up is looking down. Just a 1/10 dizziness starting off today.    Patient Stated Goals  Pt's goals for therapy are to strengthen to help with the tired feeling.    Currently in Pain?  Yes    Pain Score  2     Pain  Location  --   elbow/forearm pain   Pain Orientation  Right    Pain Descriptors / Indicators  Aching    Pain Onset  1 to 4 weeks ago                       [facility] Adult PT Treatment/Exercise - 09/19/19 1029      Transfers   Comments  pt with increased dizziness during today's session while performing turns - rated as 2/10, needed min guard for steadying      Ambulation/Gait   Ambulation/Gait  Yes    Ambulation/Gait Assistance  5: Supervision;4: Min guard    Ambulation/Gait Assistance Details  x2 laps with pt finding 10 cards around the gym with having to scan environment and bend down to pick some up, cues for scanning and head turns vs. turning trunk, pt reporting 1/10 dizziness. performed an additional lap at end of session to focus on gait with reciprocal arm swing for incr trunk rotation and to assist with counter balance - therapist facilitating arm swing/trunk rotation at shoulders    Ambulation Distance (Feet)  345 Feet    Assistive device  None    Gait Pattern  Step-through pattern;Decreased arm swing - right;Decreased arm swing - left;Decreased step length - left;Decreased  step length - right;Decreased stride length;Decreased trunk rotation    Ambulation Surface  Level;Indoor          Balance Exercises - 09/19/19 1032      Balance Exercises: Standing   Standing Eyes Closed  Wide (BOA);Limitations    Standing Eyes Closed Limitations  on single pillow in corner for incr vestibular input for balance: 3 x 15-20 second reps with pt needing intermittent UE support - no dizziness, just 1/10 swimmyheaded, performed 2 additional reps with feet slightly closer 2 x 15 seconds with pt still reporting feeling 1/10 swimmyheaded, needed break between each rep for sx to subside    Other Standing Exercises  on red mat: slow marching down and back at countertop x2 reps with intermittent UE support - pt performing looking straight ahead, during first 2 reps had dizziness after  performing turn to change direction, needed to rest 1 minute for symptoms to subside, 1/10 dizziness when performing last 2 turns           PT Short Term Goals - 09/10/19 0944      PT SHORT TERM GOAL #1   Title  Pt will be independent with HEP for improved strength, balance, and gait.  TARGET for all STGs:  4 weeks:  09/05/2019    Time  4    Period  Weeks    Status  Achieved      PT SHORT TERM GOAL #2   Title  Pt will improve 5x sit<>stand to less than or equal to 15 seconds for improved functional lower extremity strength.    Baseline  21.68 seconds on 09/03/19    Time  4    Period  Weeks    Status  Not Met      PT SHORT TERM GOAL #3   Title  Pt will improve DGI score to at least 16/24 for decresaed fall risk.    Baseline  10/24 on 09/10/19    Time  4    Period  Weeks    Status  Not Met      PT SHORT TERM GOAL #4   Title  Pt will verbalize understanding of:  fall prevention in home environment and CVA warning signs/symptoms    Time  4    Period  Weeks    Status  Achieved        PT Long Term Goals - 08/08/19 1618      PT LONG TERM GOAL #1   Title  Pt will be independent with progression of HEP to address strength, balance, gait.  TARGET 09/19/2019 (may be delayed due to scheduling)    Time  6    Period  Weeks    Status  New      PT LONG TERM GOAL #2   Title  Pt will improve 5x sit<>stand to less than or equal to 12 seconds for improved functional strength.    Time  6    Period  Weeks    Status  New      PT LONG TERM GOAL #3   Title  Pt will improve DGI score to at least 19/24 for decreased fall risk.    Time  6    Period  Weeks    Status  New      PT LONG TERM GOAL #4   Title  Pt will ambulate at least 1000 ft, indoor and outdoor surfaces, modified independently no LOB for return to independent community gait.  Time  6    Period  Weeks    Status  New            Plan - 09/19/19 1034    Clinical Impression Statement  Pt received MRI results from  [facility] - no new strokes were found and reported chronic cerebellar infarcts are flow voids and not true strokes. Pt reporting at most 2/10 dizziness during today's session after performing turns during exercises and returning back to mat. Able to subside with standing approx. 1-2 minutes and focusing on a stable target. Performing scanning activities with cues to turn head/scan with eyes vs. turning with just trunk - pt reporting 1/10 dizziness with activity. Performed another lap of gait at end of session with therapist facilitating reciprocal arm swing due to pt lacking trunk rotation and B arm swing during gait. Pt continues to demonstrate guarded movement with gait and transfers, however does report her dizziness episodes are now less frequent. Will continue to progress towards LTGs.    Personal Factors and Comorbidities  Comorbidity 3+    Comorbidities  PMH:  asthma, hyperlipidemia, spondyloslisthesis, R RTC tear    Examination-Activity Limitations  Locomotion Level;Transfers;Stand;Stairs    Examination-Participation Restrictions  Community Activity;Yard Work;Other   walking for exercise in neighborhood, return to work   Stability/Clinical Decision Making  Evolving/Moderate complexity    Rehab Potential  Good    PT Frequency  2x / week    PT Duration  6 weeks   plus eval week; total POC = 7 weeks   PT Treatment/Interventions  ADLs/Self Care Home Management;Gait training;Stair training;Functional mobility training;Therapeutic activities;Therapeutic exercise;Balance training;Neuromuscular re-education;DME Instruction;Patient/family education;Electrical Stimulation;Orthotic Fit/Training    PT Next Visit Plan  standing balance on compliant surface - eyes open with head motions (in gentle ranges), eyes closed balance on compliant surface - with wide BOS. saccades looking up/down in sitting/standing. environmental scanning. practice turning - try 180 and 360 degree slowly.    Recommended Other  Services  was going to add more visits through month of may - however pt reports trying to go back to work may 3? i would ask her about this    Consulted and Agree with Plan of Care  Patient       Patient will benefit from skilled therapeutic intervention in order to improve the following deficits and impairments:  Abnormal gait, Difficulty walking, Decreased endurance, Decreased balance, Decreased activity tolerance, Decreased strength, Decreased mobility  Visit Diagnosis: Other lack of coordination  Muscle weakness (generalized)  Other abnormalities of gait and mobility  Unsteadiness on feet     Problem List Patient Active Problem List   Diagnosis Date Noted  . CVA (cerebral vascular accident) ([place]) 07/29/2019  . Dysphagia 07/29/2019  . Left hand weakness 07/29/2019  . Palpitations 09/15/2015  . Diastolic dysfunction 83/67/2550  . Renal cyst, left 06/19/2013  . History of migraine headaches 03/05/2013  . Complicated migraine 01/64/2903  . HLD (hyperlipidemia) 03/05/2013  . 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 Rotator cuff syndrome 03/05/2013  . Low back pain 03/05/2013    Arliss Journey, PT, DPT  09/19/2019, 10:45 AM  [facility] [address] [place], [place], [address] Phone: [phone]   Fax:  [phone]  Name: Tara [name] MRN: [id number] Date of Birth: 1965-06-22

## 2019-09-23 ENCOUNTER — Ambulatory Visit: Payer: 59 | Admitting: Occupational Therapy

## 2019-09-23 ENCOUNTER — Ambulatory Visit: Payer: 59 | Admitting: Physical Therapy

## 2019-09-23 ENCOUNTER — Other Ambulatory Visit: Payer: Self-pay

## 2019-09-23 DIAGNOSIS — R2681 Unsteadiness on feet: Secondary | ICD-10-CM

## 2019-09-23 DIAGNOSIS — R278 Other lack of coordination: Secondary | ICD-10-CM | POA: Diagnosis not present

## 2019-09-23 DIAGNOSIS — I69318 Other symptoms and signs involving cognitive functions following cerebral infarction: Secondary | ICD-10-CM | POA: Diagnosis not present

## 2019-09-23 DIAGNOSIS — M6281 Muscle weakness (generalized): Secondary | ICD-10-CM | POA: Diagnosis not present

## 2019-09-23 DIAGNOSIS — R42 Dizziness and giddiness: Secondary | ICD-10-CM | POA: Diagnosis not present

## 2019-09-23 DIAGNOSIS — R2689 Other abnormalities of gait and mobility: Secondary | ICD-10-CM | POA: Diagnosis not present

## 2019-09-23 NOTE — Patient Instructions (Addendum)
Access Code: [email or web address] URL: [email or web address] Date: 09/23/2019 Prepared by: Lonia Blood  Exercises Seated Gaze Stabilization with Head Rotation/Vertical Nods - 1-2 x daily - 5 x weekly - 3 sets - 5 reps Sit to Stand - 1-2 x daily - 5 x weekly - 3 sets - 5 reps Standing Marching - 1-2 x daily - 5 x weekly - 3 sets Standing Balance with Eyes Closed - 2 x daily - 5 x weekly - 3 sets - 15 hold Tandem Stance - 1 x daily - 5 x weekly - 3 sets - 20 hold Seated Nose to Left Knee Vestibular Habituation - 2 x daily - 5 x weekly - 1 sets - 3-4 reps Seated Nose to Right Knee Vestibular Habituation - 2 x daily - 5 x weekly - 1 sets - 3-4 reps Vestibular Habituation - Seated Horizontal Head Rotation - 1 x daily - 5 x weekly - 3 sets - 5 reps Seated Head Nods Vestibular Habituation - 1 x daily - 5 x weekly - 3 sets - 5 reps  Added 09/23/2019 Lateral Weight Shift with Arm Raise - 1 x daily - 5 x weekly - 1 sets - 10 reps Standing Quarter Turn with Counter Support - 1 x daily - 5 x weekly - 1 sets - 5 reps

## 2019-09-23 NOTE — Therapy (Signed)
[facility] [address] [place], [place], [address] Phone: [phone]   Fax:  [phone]  Occupational Therapy Treatment  Patient Details  Name: Tara [name] MRN: [id number] Date of Birth: 11-Aug-1965 No data recorded  Encounter Date: 09/23/2019  OT End of Session - 09/23/19 1440    Visit Number  8    Number of Visits  13    Date for OT Re-Evaluation  10/10/19   extended 2 weeks due to missed weeks   Authorization Type  [facility] [employer]    Authorization Time Period  week 4/6    OT Start Time  1015    OT Stop Time  1100    OT Time Calculation (min)  45 min    Activity Tolerance  Patient tolerated treatment well    Behavior During Therapy  [facility] for tasks assessed/performed       Past Medical History:  Diagnosis Date  . Asthma   . Hyperlipidemia   . Migraine   . Rotator cuff tear   . Spondylolisthesis    L4-5    Past Surgical History:  Procedure Laterality Date  . BUBBLE STUDY  07/30/2019   Procedure: BUBBLE STUDY;  Surgeon: [name], MD;  Location: [facility] ENDOSCOPY;  Service: Cardiovascular;;  . LOOP RECORDER INSERTION N/A 07/30/2019   Procedure: LOOP RECORDER INSERTION;  Surgeon: [name], MD;  Location: [facility] INVASIVE CV LAB;  Service: Cardiovascular;  Laterality: N/A;  . No prior surgery    . TEE WITHOUT CARDIOVERSION N/A 07/30/2019   Procedure: TRANSESOPHAGEAL ECHOCARDIOGRAM (TEE);  Surgeon: [name], MD;  Location: [facility] ENDOSCOPY;  Service: Cardiovascular;  Laterality: N/A;    There were no vitals filed for this visit.  Subjective Assessment - 09/23/19 1025    Subjective   Pt also reports long and ring finger of Lt hand goes numb at times and reports this began with stroke (no symptoms prior to stroke)    Pertinent History  CVA 07/29/19. PMH: HLD, migraines, asthma, small chronic cerebellar infarcts    Limitations  fall risk, loop recorder, ? swallowing precautions (chin tuck)    Patient Stated Goals  typing, cooking, cleaning, driving, working    Currently in Pain?  Yes    Pain Score  2     Pain Location  Elbow   and forearm   Pain Orientation  Right    Pain Descriptors / Indicators  Aching    Pain Type  Acute pain    Pain Onset  1 to 4 weeks ago    Pain Frequency  Intermittent    Aggravating Factors   overuse    Pain Relieving Factors  ice       Pt performing saccades (side to side, diagonals, and up to down targets) and typing games (bubbles and word trics) with dizziness 1/10.  Pt tossing ball Lt hand for coordination and quick visual scanning w/o head movement, placing things in drawers looking down w/ minimal head flex, and slow head turns with environmental scanning all with 2/10 dizziness. Pt 90% accurate w/ environmental scanning with extra time required and min cueing.  Pt had questions re: return to work - pt able to work from home - therapist still had concerns due to extra time required to complete tasks and fatigue. Pt instructed absolutely no driving.                       OT Short Term Goals -  09/19/19 1035      OT SHORT TERM GOAL #1   Title  Independent with HEP for LUE strengthening (shoulder and hand)    Time  3    Period  Weeks    Status  Achieved      OT SHORT TERM GOAL #2   Title  Grip strength Lt hand to be 45 lbs or greater    Baseline  35 lbs (Rt = 56)    Time  3    Period  Weeks    Status  On-going      OT SHORT TERM GOAL #3   Title  Pt will increase typing speed to 20 wpm, 90% accuracy in prep for return to work    Time  3    Period  Weeks    Status  On-going   18 wpm     OT SHORT TERM GOAL #4   Title  Pt to perform environmental scanning w/ head turns and no LOB, min dizziness w/ 90% accuracy    Time  3    Period  Weeks    Status  On-going        OT Long Term Goals - 09/03/19 1547      OT LONG TERM GOAL #1   Title  Pt to report less drops Lt hand t/o day    Time  6    Period  Weeks     Status  New      OT LONG TERM GOAL #2   Title  Pt to perform cleaning tasks for 20 min w/o rest mod I level    Time  6    Period  Weeks    Status  New      OT LONG TERM GOAL #3   Title  Pt to perform full cooking tasks mod I level safely    Time  6    Period  Weeks    Status  New      OT LONG TERM GOAL #4   Title  Pt to simulate work tasks w/ extra time as required    Time  6    Period  Weeks    Status  New            Plan - 09/23/19 1442    Clinical Impression Statement  Pt making slow steady progress towards STG's. Pt w/ minimal dizziness during O.T. session    Occupational performance deficits (Please refer to evaluation for details):  IADL's;Work;Leisure    Rehab Potential  Good    OT Frequency  2x / week    OT Duration  6 weeks    OT Treatment/Interventions  Self-care/ADL training;Therapeutic exercise;Functional Mobility Training;Neuromuscular education;Manual Therapy;Therapeutic activities;Coping strategies training;DME and/or AE instruction;Cognitive remediation/compensation;Visual/perceptual remediation/compensation;Moist Heat;Passive range of motion;Patient/family education    Plan  grip strength, environmental scanning, functional tasks w/ smaller range head turns    Recommended Other Services  will monitor for need for speech therapy services    Consulted and Agree with Plan of Care  Patient       Patient will benefit from skilled therapeutic intervention in order to improve the following deficits and impairments:           Visit Diagnosis: Other lack of coordination  Muscle weakness (generalized)  Unsteadiness on feet    Problem List Patient Active Problem List   Diagnosis Date Noted  . CVA (cerebral vascular accident) ([place]) 07/29/2019  . Dysphagia 07/29/2019  . Left hand weakness 07/29/2019  .  Palpitations 09/15/2015  . Diastolic dysfunction 09/15/2015  . Renal cyst, left 06/19/2013  . History of migraine headaches 03/05/2013  .  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 HLD (hyperlipidemia) 03/05/2013  . 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 Rotator cuff syndrome 03/05/2013  . Low back pain 03/05/2013    Kelli Churn, OTR/L 09/23/2019, 2:44 PM  [facility] [address] [place], [place], [address] Phone: [phone]   Fax:  [phone]  Name: AMYIA LODWICK MRN: [id number] Date of Birth: May 18, 1966

## 2019-09-23 NOTE — Therapy (Signed)
[facility] [address] [place], [place], [address] Phone: [phone]   Fax:  [phone]  Physical Therapy Treatment  Patient Details  Name: Tara [name] MRN: [id number] Date of Birth: Aug 09, 1965 No data recorded  Encounter Date: 09/23/2019  PT End of Session - 09/23/19 1513    Visit Number  12    Number of Visits  13    Authorization Type  [facility]    PT Start Time  1105    PT Stop Time  1150    PT Time Calculation (min)  45 min    Equipment Utilized During Treatment  Gait belt    Activity Tolerance  Patient tolerated treatment well   limited by dizziness; at worst, 3/10   Behavior During Therapy  [facility] for tasks assessed/performed       Past Medical History:  Diagnosis Date  . Asthma   . Hyperlipidemia   . Migraine   . Rotator cuff tear   . Spondylolisthesis    L4-5    Past Surgical History:  Procedure Laterality Date  . BUBBLE STUDY  07/30/2019   Procedure: BUBBLE STUDY;  Surgeon: Donato Heinz, MD;  Location: [place];  Service: Cardiovascular;;  . LOOP RECORDER INSERTION N/A 07/30/2019   Procedure: LOOP RECORDER INSERTION;  Surgeon: Evans Lance, MD;  Location: [facility] CV LAB;  Service: Cardiovascular;  Laterality: N/A;  . No prior surgery    . TEE WITHOUT CARDIOVERSION N/A 07/30/2019   Procedure: TRANSESOPHAGEAL ECHOCARDIOGRAM (TEE);  Surgeon: Donato Heinz, MD;  Location: [facility] ENDOSCOPY;  Service: Cardiovascular;  Laterality: N/A;    There were no vitals filed for this visit.  Subjective Assessment - 09/23/19 1105    Subjective  The dizziness is on and off-depends on what I do.  The frequency of the dizziness is not as much.  Looking down is still worse.  Want to try to go back to work next week, but I'm not sure.    Patient Stated Goals  Pt's goals for therapy are to strengthen to help with the tired feeling.    Currently in Pain?  Yes    Pain Score  2     Pain  Location  Elbow    Pain Orientation  Right    Pain Descriptors / Indicators  Aching    Pain Type  Acute pain    Pain Onset  1 to 4 weeks ago    Aggravating Factors   overuse    Pain Relieving Factors  ice    Effect of Pain on Daily Activities  OT addressing                 Neuro Re-education: Standing balance exercises at counter: -lateral weightshifting x 10 reps (also performed in corner, 2 sets x 5 reps, just for gentle motion) -Progressed to lateral weightshifting with same arm reaching to cabinet, x 5 reps each side -Progressed to lateral weigthshfiting with same arm reaching/looking to hand, x 3 reps each side (c/o 2/10 dizziness)  -wide BOS with heel/toe raises x 10 reps, 2 sets -Stagger stance with anterior/posterior weightshifting x 10 reps each foot position (c/o 2/10 dizziness)  Turning work: -At [employer], with chair at 90 degree angle:  Quarter/90 degree turn to R, then L back to counter, with UE support x 5 reps Progressed to quarter turn/90degree turn to R, then L back to counter, initiated with head turn to that side, x 4 reps (pt c/o  2/10 dizziness)  Figure-8 turn, walking wide around cones, with min guard assistance, slowed and ataxic gait (pt c/o 3/10 dizziness and nausea, which subsides after 1-2 minutes) Wide U-turns around cones, x 3 reps, with slowed and ataxic gait, min guard for improved wide turning activities.  Cues for relaxed arm swing with gait to avoid mid-guard arms and reaching for furniture.  Discussed POC, discussed possibility of return to work (this is week 6 of 6 in current POC), and pt may like to continue (goals to be checked next visit) to further address gait and balance           Balance Exercises - 09/23/19 1108      Balance Exercises: Standing   Standing Eyes Opened  Wide (BOA);Head turns;3 reps;Solid surface;Foam/compliant surface   Head nods, 2 sets   Standing Eyes Closed  Wide (BOA);Limitations;Foam/compliant  surface;4 reps   EC, BUE support>1>no UE support; incr. trunk sway   Standing Eyes Closed Limitations  On single pillow in corner for increased vestibular input for balance.  Progressively less UE support, with increased trunk sway with therapist min guard support.    Other Standing Exercises  --    Other Standing Exercises Comments  --        PT Education - 09/23/19 1513    Education Details  Additions to HEP for continued mobility    Person(s) Educated  Patient    Methods  Explanation;Demonstration;Handout    Comprehension  Verbalized understanding;Returned demonstration       PT Short Term Goals - 09/10/19 0944      PT SHORT TERM GOAL #1   Title  Pt will be independent with HEP for improved strength, balance, and gait.  TARGET for all STGs:  4 weeks:  09/05/2019    Time  4    Period  Weeks    Status  Achieved      PT SHORT TERM GOAL #2   Title  Pt will improve 5x sit<>stand to less than or equal to 15 seconds for improved functional lower extremity strength.    Baseline  21.68 seconds on 09/03/19    Time  4    Period  Weeks    Status  Not Met      PT SHORT TERM GOAL #3   Title  Pt will improve DGI score to at least 16/24 for decresaed fall risk.    Baseline  10/24 on 09/10/19    Time  4    Period  Weeks    Status  Not Met      PT SHORT TERM GOAL #4   Title  Pt will verbalize understanding of:  fall prevention in home environment and CVA warning signs/symptoms    Time  4    Period  Weeks    Status  Achieved        PT Long Term Goals - 08/08/19 1618      PT LONG TERM GOAL #1   Title  Pt will be independent with progression of HEP to address strength, balance, gait.  TARGET 09/19/2019 (may be delayed due to scheduling)    Time  6    Period  Weeks    Status  New      PT LONG TERM GOAL #2   Title  Pt will improve 5x sit<>stand to less than or equal to 12 seconds for improved functional strength.    Time  6    Period  Weeks    Status  New      PT LONG TERM GOAL #3    Title  Pt will improve DGI score to at least 19/24 for decreased fall risk.    Time  6    Period  Weeks    Status  New      PT LONG TERM GOAL #4   Title  Pt will ambulate at least 1000 ft, indoor and outdoor surfaces, modified independently no LOB for return to independent community gait.    Time  6    Period  Weeks    Status  New            Plan - 09/23/19 1514    Clinical Impression Statement  In addition to static standing activities with head motions today, also incorporated weightshifting through hips and lower extremities to help with decreased overall guarding in standing positions.  Also worked on turns, incorporated head/body movements.  Pt noted with short distance gait today to have slight arm swing, and overall pt's ability to perform varied activities throughout therapy session is improving.  Her worst dizziness c/o is 3/10 (with nausea) with figure-8 turns, which subsides after 1-2 minutes.  While pt is attempting to incorporate increased movment into her activities, she continues to demonstrate decreased overall speed of movement, decreased trunk/body/arm/head dissociation, and increased dizziness with challenging movement patterns.  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    Personal Factors and Comorbidities  Comorbidity 3+    Comorbidities  PMH:  asthma, hyperlipidemia, spondyloslisthesis, R RTC tear    Examination-Activity Limitations  Locomotion Level;Transfers;Stand;Stairs    Examination-Participation Restrictions  Community Activity;Yard Work;Other   walking for exercise in neighborhood, return to work   Stability/Clinical Decision Making  Evolving/Moderate complexity    Rehab Potential  Good    PT Frequency  2x / week    PT Duration  6 weeks   plus eval week; total POC = 7 weeks   PT Treatment/Interventions  ADLs/Self Care Home  Management;Gait training;Stair training;Functional mobility training;Therapeutic activities;Therapeutic exercise;Balance training;Neuromuscular re-education;DME Instruction;Patient/family education;Electrical Stimulation;Orthotic Fit/Training    PT Next Visit Plan  Revie updates given in 09/23/2019 HEP.  AT NEXT VISIT:  need to assess LTGs and likely recert (went ahead and scheduled 1x/wk for 4 weeks); continue to work on standing balance on compliant surface - eyes open with head motions (in gentle ranges), eyes closed balance on compliant surface - with wide BOS. saccades looking up/down in sitting/standing. environmental scanning. practice turning - try 180 and 360 degree slowly.    Consulted and Agree with Plan of Care  Patient       Patient will benefit from skilled therapeutic intervention in order to improve the following deficits and impairments:  Abnormal gait, Difficulty walking, Decreased endurance, Decreased balance, Decreased activity tolerance, Decreased strength, Decreased mobility  Visit Diagnosis: Unsteadiness on feet     Problem List Patient Active Problem List   Diagnosis Date Noted  . CVA (cerebral vascular accident) ([place]) 07/29/2019  . Dysphagia 07/29/2019  . Left hand weakness 07/29/2019  . Palpitations 09/15/2015  . Diastolic dysfunction 81/19/1478  . Renal cyst, left 06/19/2013  . History of migraine headaches 03/05/2013  . Complicated migraine 29/56/2130  . HLD (hyperlipidemia) 03/05/2013  . 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 Rotator cuff syndrome 03/05/2013  . Low back pain 03/05/2013    Linzi Ohlinger W. 09/23/2019, 3:23 PM Frazier Butt., PT   Outpt  [facility] [address] [place], [place], [address] Phone: [phone]   Fax:  [phone]  Name: Tara [name] MRN: [id number] Date of Birth: 1965-07-23

## 2019-09-25 ENCOUNTER — Ambulatory Visit: Payer: 59 | Admitting: Occupational Therapy

## 2019-09-25 ENCOUNTER — Telehealth: Payer: Self-pay | Admitting: Adult Health

## 2019-09-25 ENCOUNTER — Other Ambulatory Visit: Payer: Self-pay

## 2019-09-25 ENCOUNTER — Ambulatory Visit: Payer: 59 | Admitting: Physical Therapy

## 2019-09-25 ENCOUNTER — Encounter: Payer: Self-pay | Admitting: *Deleted

## 2019-09-25 DIAGNOSIS — M6281 Muscle weakness (generalized): Secondary | ICD-10-CM | POA: Diagnosis not present

## 2019-09-25 DIAGNOSIS — R42 Dizziness and giddiness: Secondary | ICD-10-CM | POA: Diagnosis not present

## 2019-09-25 DIAGNOSIS — R2681 Unsteadiness on feet: Secondary | ICD-10-CM

## 2019-09-25 DIAGNOSIS — I69318 Other symptoms and signs involving cognitive functions following cerebral infarction: Secondary | ICD-10-CM | POA: Diagnosis not present

## 2019-09-25 DIAGNOSIS — R278 Other lack of coordination: Secondary | ICD-10-CM

## 2019-09-25 DIAGNOSIS — R2689 Other abnormalities of gait and mobility: Secondary | ICD-10-CM | POA: Diagnosis not present

## 2019-09-25 NOTE — Therapy (Signed)
[facility] [address] [place], [place], [address] Phone: [phone]   Fax:  [phone]  Physical Therapy Treatment/Re-Cert  Patient Details  Name: Tara [name] MRN: [id number] Date of Birth: 1965/07/23 No data recorded  Encounter Date: 09/25/2019  PT End of Session - 09/25/19 1156    Visit Number  13    Number of Visits  17    Authorization Type  [facility] [employer]    PT Start Time  0934    PT Stop Time  1016    PT Time Calculation (min)  42 min    Equipment Utilized During Treatment  Gait belt    Activity Tolerance  Patient tolerated treatment well   limited by dizziness; at worst, 2/10   Behavior During Therapy  [facility] for tasks assessed/performed       Past Medical History:  Diagnosis Date  . Asthma   . Hyperlipidemia   . Migraine   . Rotator cuff tear   . Spondylolisthesis    L4-5    Past Surgical History:  Procedure Laterality Date  . BUBBLE STUDY  07/30/2019   Procedure: BUBBLE STUDY;  Surgeon: Donato Heinz, MD;  Location: [facility];  Service: Cardiovascular;;  . LOOP RECORDER INSERTION N/A 07/30/2019   Procedure: LOOP RECORDER INSERTION;  Surgeon: Evans Lance, MD;  Location: [facility] CV LAB;  Service: Cardiovascular;  Laterality: N/A;  . No prior surgery    . TEE WITHOUT CARDIOVERSION N/A 07/30/2019   Procedure: TRANSESOPHAGEAL ECHOCARDIOGRAM (TEE);  Surgeon: Donato Heinz, MD;  Location: [facility] ENDOSCOPY;  Service: Cardiovascular;  Laterality: N/A;    There were no vitals filed for this visit.  Subjective Assessment - 09/25/19 0936    Subjective  Dizziness today is a 1/10. Has been doing the new exercises and they are going well.    Patient Stated Goals  Pt's goals for therapy are to strengthen to help with the tired feeling.    Currently in Pain?  Yes    Pain Score  2     Pain Location  Elbow   also having feelings of numbness in L hand   Pain Onset  1 to 4 weeks ago          [name] PT Assessment - 09/25/19 0001      Dynamic Gait Index   Level Surface  Mild Impairment    Change in Gait Speed  Moderate Impairment    Gait with Horizontal Head Turns  Moderate Impairment    Gait with Vertical Head Turns  Moderate Impairment    Gait and Pivot Turn  Moderate Impairment    Step Over Obstacle  Moderate Impairment    Step Around Obstacles  Moderate Impairment    Steps  Moderate Impairment    Total Score  9    DGI comment:  9/24                   [facility] Adult PT Treatment/Exercise - 09/25/19 0001      Transfers   Five time sit to stand comments   5 reps with no UE support from standard height chair: 38.9 seconds 1st rep, 39.19 seconds the 2nd rep - felt lightheaded, no dizziness    Comments  during 2nd rep pt with decr anterior weight shift and decr eccentric control       Ambulation/Gait   Gait velocity  20.78 seconds = 1.57 ft/sec     Stairs  Yes  Stairs Assistance  4: Min guard    Stairs Assistance Details (indicate cue type and reason)  alternating pattern with B railings while ascending, B handrails while descending with step to pattern and pt remaining very guarded and not looking down due to dizziness    Stair Management Technique  Two rails;Alternating pattern;Step to pattern    Number of Stairs  4    Height of Stairs  6        Neuro Re-education: Standing balance exercises at counter (reviewed from most recent HEP): -lateral weightshifting x 10 reps with wider BOS and BUE support on counter -Progressed to lateral weightshifting with same arm reaching to cabinet, x 5 reps each side -Progressed to lateral weigthshfiting with same arm reaching/looking to hand, x 3 reps each side (with reports of 2/10 dizziness) -Performed an additional x5 reps B with reaching towards counter and cues for patient to just look to hands with eyes with reports of 1/10 dizziness    -at countertop: quarter degree turn to R and then back to counter x5  reps, quarter degree turn to L and then back to counter x5 reps, with single UE support, pt with 1/10 dizziness at most to both directions    -at countertop with BUE support on counter: Staggered stance A/P weight shifting with cues for proper technique and weight shift, pushing off toes and lifting toes up x10 reps B, reporting 1/10 dizziness -Progressed to single UE support adding additional arm swing to weight shifting x8 reps B, with pt improving movement with each rep, 1/10 dizziness with LLE anteriorly, and 2/10 dizziness during last 3 reps with RLE anteriorly.      PT Education - 09/25/19 1156    Education Details  progress towards goals, continuing POC for 1x week for 4 weeks    Person(s) Educated  Patient    Methods  Explanation    Comprehension  Verbalized understanding       PT Short Term Goals - 09/10/19 0944      PT SHORT TERM GOAL #1   Title  Pt will be independent with HEP for improved strength, balance, and gait.  TARGET for all STGs:  4 weeks:  09/05/2019    Time  4    Period  Weeks    Status  Achieved      PT SHORT TERM GOAL #2   Title  Pt will improve 5x sit<>stand to less than or equal to 15 seconds for improved functional lower extremity strength.    Baseline  21.68 seconds on 09/03/19    Time  4    Period  Weeks    Status  Not Met      PT SHORT TERM GOAL #3   Title  Pt will improve DGI score to at least 16/24 for decresaed fall risk.    Baseline  10/24 on 09/10/19    Time  4    Period  Weeks    Status  Not Met      PT SHORT TERM GOAL #4   Title  Pt will verbalize understanding of:  fall prevention in home environment and CVA warning signs/symptoms    Time  4    Period  Weeks    Status  Achieved        PT Long Term Goals - 09/25/19 1204      PT LONG TERM GOAL #1   Title  Pt will be independent with progression of HEP to address strength, balance, gait.  TARGET 09/19/2019 (may be delayed due to scheduling)    Baseline  met with current HEP    Time  6     Period  Weeks    Status  Achieved      PT LONG TERM GOAL #2   Title  Pt will improve 5x sit<>stand to less than or equal to 12 seconds for improved functional strength.    Baseline  38.9 seconds with no UE support from standard chair    Time  6    Period  Weeks    Status  Not Met      PT LONG TERM GOAL #3   Title  Pt will improve DGI score to at least 19/24 for decreased fall risk.    Baseline  9/24 on 09/25/19    Time  6    Period  Weeks    Status  Not Met      PT LONG TERM GOAL #4   Title  Pt will ambulate at least 1000 ft, indoor and outdoor surfaces, modified independently no LOB for return to independent community gait.    Baseline  not performed today due to time constraints    Time  6    Period  Weeks    Status  Deferred       Revised/on-going LTGs:     PT Long Term Goals - 09/25/19 1210      PT LONG TERM GOAL #1   Title  Pt will be independent with progression of HEP to address strength, balance, gait, and vestibular impairments.  TARGET 10/23/2019 (may be delayed due to scheduling)    Baseline  pt will continue to benefit from revisions/additions to HEP    Time  4    Period  Weeks    Status  On-going    Target Date  10/23/19      PT LONG TERM GOAL #2   Title  Pt will improve 5x sit<>stand to less than or equal to 22 seconds for improved functional strength.    Baseline  38.9 seconds with no UE support from standard chair    Time  4    Period  Weeks    Status  Revised      PT LONG TERM GOAL #3   Title  Pt will improve DGI score to at least 13/24 for decreased fall risk and improved functional mobility.    Baseline  9/24 on 09/25/19    Time  4    Period  Weeks    Status  Revised      PT LONG TERM GOAL #4   Title  Pt will ambulate at least 500 ft, indoor and outdoor surfaces, with supervision and no LOB for return to independent community gait.    Baseline  not performed today due to time constraints    Time  4    Period  Weeks    Status  Revised       PT LONG TERM GOAL #5   Title  Pt will improve gait speed to at least 1.9 ft/sec for improved gait efficiency and decr fall risk.    Baseline  1.57 ft/sec on 09/25/19    Time  4    Period  Weeks    Status  New         Plan - 09/25/19 1205    Clinical Impression Statement  Reviewed current additions to pt's HEP - including lateral weight shifting and quater turns. Pt reporting 2/10 dizziness with  lateral weight shifting and reaching/looking towards hand, that decr after standing rest break of 1-2 minutes. Assessed pt's LTGs for re-cert today. Pt has met LTG #1 in regards to being independent with current HEP.Pt did not meet LTGs in regards to DGI and 5x sit <> stand. Pt scored a 9/24 on the DGI indicating that pt is at a high risk of falls - pt continues to be guarded with movement especially with turns and head motions. Pt decr in her sit <> stand time and performed in 38.9 seconds with no UE support from chair (was  20.43  seconds at eval). Pt's gait speed of 1.57 ft/sec puts pt at a higher risk for falls and is a limited community ambulator. Will re-cert for an additional 1x week for 4 weeks to address dizziness, gait, functional transfers/stairs, functional BLE strength in order to improve functional mobility, decr fall risk, and improve overall movement patterns. Revised/added LTGs as appropriate.    Personal Factors and Comorbidities  Comorbidity 3+    Comorbidities  PMH:  asthma, hyperlipidemia, spondyloslisthesis, R RTC tear    Examination-Activity Limitations  Locomotion Level;Transfers;Stand;Stairs    Examination-Participation Restrictions  Community Activity;Yard Work;Other   walking for exercise in neighborhood, return to work   Stability/Clinical Decision Making  Evolving/Moderate complexity    Rehab Potential  Good    PT Frequency  1x / week    PT Duration  4 weeks    PT Treatment/Interventions  ADLs/Self Care Home Management;Gait training;Stair training;Functional mobility  training;Therapeutic activities;Therapeutic exercise;Balance training;Neuromuscular re-education;DME Instruction;Patient/family education;Electrical Stimulation;Orthotic Fit/Training;Vestibular    PT Next Visit Plan  work on weight shifting, continue to work on standing balance on compliant surface - eyes open with head motions (in gentle ranges), eyes closed balance on compliant surface - with wide BOS. saccades looking up/down in sitting/standing. environmental scanning. practice turning - try 180 and 360 degree slowly.    Consulted and Agree with Plan of Care  Patient       Patient will benefit from skilled therapeutic intervention in order to improve the following deficits and impairments:  Abnormal gait, Difficulty walking, Decreased endurance, Decreased balance, Decreased activity tolerance, Decreased strength, Decreased mobility  Visit Diagnosis: Muscle weakness (generalized)  Other lack of coordination  Unsteadiness on feet  Other abnormalities of gait and mobility  Dizziness and giddiness     Problem List Patient Active Problem List   Diagnosis Date Noted  . CVA (cerebral vascular accident) ([facility]) 07/29/2019  . Dysphagia 07/29/2019  . Left hand weakness 07/29/2019  . Palpitations 09/15/2015  . Diastolic dysfunction 03/27/1313  . Renal cyst, left 06/19/2013  . History of migraine headaches 03/05/2013  . Complicated migraine 38/88/7579  . HLD (hyperlipidemia) 03/05/2013  . 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 Rotator cuff syndrome 03/05/2013  . Low back pain 03/05/2013    Arliss Journey, PT, DPT  09/25/2019, 12:10 PM  [facility] [address] [place], [place], [address] Phone: [phone]   Fax:  [phone]  Name: JAZIYAH GRADEL MRN: [id number] Date of Birth: 05/02/1966

## 2019-09-25 NOTE — Telephone Encounter (Signed)
Letter provided MATRIX, Loni Dolly to [phone]. Fax confirmation received.

## 2019-09-25 NOTE — Telephone Encounter (Signed)
Can you please provide note.  Thank you.

## 2019-09-25 NOTE — Telephone Encounter (Signed)
Pt has returned the call to [place], [place]

## 2019-09-25 NOTE — Telephone Encounter (Signed)
Pt called from the parking lot after she spoke to someone at the front desk wanting to know if she can speak to provider or RN about getting her leave extended and also to ask questions about her PT and OT. Pt was informed that when a message is sent the RN has 24-48hrs to call back.

## 2019-09-25 NOTE — Telephone Encounter (Signed)
Has appt 11-19-19 with JM/NP continues with PT/OT.  I LMVM for pt.

## 2019-09-25 NOTE — Telephone Encounter (Signed)
Patient came in today asking if she can receive a call back from NP or RN to discuss extending her leave from work. She feels that she is not ready to return yet.

## 2019-09-25 NOTE — Telephone Encounter (Signed)
Can you please reschedule patient with Dr. Pearlean Brownie in regards to ongoing reported symptoms with possible underlying component of anxiety but request further assessment and input from Dr. Pearlean Brownie as well as ongoing recommendation for returning back to work.  Thank you.

## 2019-09-25 NOTE — Telephone Encounter (Signed)
Spoke to pt and she states that per PT/OT reporting to work, concerned needing extratime to complete tasks, due to tatigue and dizziness/ intermittent.  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  Tara [name] needs note stating has appt 09-29-19 and will reassess extension.

## 2019-09-25 NOTE — Therapy (Signed)
[facility] [address] [place], [place], [address] Phone: [phone]   Fax:  [phone]  Occupational Therapy Treatment  Patient Details  Name: Tara [name] MRN: [id number] Date of Birth: 29-Aug-1965 No data recorded  Encounter Date: 09/25/2019  OT End of Session - 09/25/19 1046    Visit Number  9    Number of Visits  13    Date for OT Re-Evaluation  10/10/19    Authorization Type  [facility]    Authorization Time Period  week 4/6    OT Start Time  1020    OT Stop Time  1100    OT Time Calculation (min)  40 min    Activity Tolerance  Patient tolerated treatment well    Behavior During Therapy  [facility] for tasks assessed/performed       Past Medical History:  Diagnosis Date  . Asthma   . Hyperlipidemia   . Migraine   . Rotator cuff tear   . Spondylolisthesis    L4-5    Past Surgical History:  Procedure Laterality Date  . BUBBLE STUDY  07/30/2019   Procedure: BUBBLE STUDY;  Surgeon: [name], MD;  Location: [facility] ENDOSCOPY;  Service: Cardiovascular;;  . LOOP RECORDER INSERTION N/A 07/30/2019   Procedure: LOOP RECORDER INSERTION;  Surgeon: [name], MD;  Location: [facility] INVASIVE CV LAB;  Service: Cardiovascular;  Laterality: N/A;  . No prior surgery    . TEE WITHOUT CARDIOVERSION N/A 07/30/2019   Procedure: TRANSESOPHAGEAL ECHOCARDIOGRAM (TEE);  Surgeon: [name], MD;  Location: [facility] ENDOSCOPY;  Service: Cardiovascular;  Laterality: N/A;    There were no vitals filed for this visit.  Subjective Assessment - 09/25/19 1024    Pertinent History  CVA 07/29/19. PMH: HLD, migraines, asthma, small chronic cerebellar infarcts    Limitations  fall risk, loop recorder, ? swallowing precautions (chin tuck)    Patient Stated Goals  typing, cooking, cleaning, driving, working    Currently in Pain?  Yes    Pain Score  2     Pain Location  Elbow   forearm   Pain Orientation  Right    Pain  Descriptors / Indicators  Aching    Pain Type  Acute pain    Pain Onset  1 to 4 weeks ago    Pain Frequency  Intermittent    Aggravating Factors   overuse    Pain Relieving Factors  ice                   OT Treatments/Exercises (OP) - 09/25/19 0001      Exercises   Exercises  Hand      Hand Exercises   Other Hand Exercises  Gripper set at level 2 to pick up blocks Lt hand for sustained grip strength w/ no difficulty, increased to level 3 resistance with min difficulty and drops      Visual/Perceptual Exercises   Scanning  Environmental    Scanning - Environmental  Pt found 9/11 items on first pass missing 2 on Rt side. Pt found remaining 2 items on 2nd pass.       Fine Motor Coordination (Hand/Wrist)   Fine Motor Coordination  Small Pegboard    Small Pegboard  Placing small pegs in pegboard Lt hand for coordination while copying design at 100% accuracy              OT Short Term Goals - 09/19/19 1035  OT SHORT TERM GOAL #1   Title  Independent with HEP for LUE strengthening (shoulder and hand)    Time  3    Period  Weeks    Status  Achieved      OT SHORT TERM GOAL #2   Title  Grip strength Lt hand to be 45 lbs or greater    Baseline  35 lbs (Rt = 56)    Time  3    Period  Weeks    Status  On-going      OT SHORT TERM GOAL #3   Title  Pt will increase typing speed to 20 wpm, 90% accuracy in prep for return to work    Time  3    Period  Weeks    Status  On-going   18 wpm     OT SHORT TERM GOAL #4   Title  Pt to perform environmental scanning w/ head turns and no LOB, min dizziness w/ 90% accuracy    Time  3    Period  Weeks    Status  On-going        OT Long Term Goals - 09/03/19 1547      OT LONG TERM GOAL #1   Title  Pt to report less drops Lt hand t/o day    Time  6    Period  Weeks    Status  New      OT LONG TERM GOAL #2   Title  Pt to perform cleaning tasks for 20 min w/o rest mod I level    Time  6    Period  Weeks     Status  New      OT LONG TERM GOAL #3   Title  Pt to perform full cooking tasks mod I level safely    Time  6    Period  Weeks    Status  New      OT LONG TERM GOAL #4   Title  Pt to simulate work tasks w/ extra time as required    Time  6    Period  Weeks    Status  New            Plan - 09/25/19 1100    Clinical Impression Statement  Pt making slow steady progress towards STG's. Pt w/ minimal dizziness during O.T. session    Occupational performance deficits (Please refer to evaluation for details):  IADL's;Work;Leisure    Rehab Potential  Good    OT Frequency  2x / week    OT Duration  6 weeks    OT Treatment/Interventions  Self-care/ADL training;Therapeutic exercise;Functional Mobility Training;Neuromuscular education;Manual Therapy;Therapeutic activities;Coping strategies training;DME and/or AE instruction;Cognitive remediation/compensation;Visual/perceptual remediation/compensation;Moist Heat;Passive range of motion;Patient/family education    Plan  continue typing, visual scanning, assess remaining STG's and progress towards LTG's   Recommended Other Services  Speech eval scheduled    Consulted and Agree with Plan of Care  Patient       Patient will benefit from skilled therapeutic intervention in order to improve the following deficits and impairments:           Visit Diagnosis: Other lack of coordination  Muscle weakness (generalized)  Unsteadiness on feet    Problem List Patient Active Problem List   Diagnosis Date Noted  . CVA (cerebral vascular accident) ([facility]) 07/29/2019  . Dysphagia 07/29/2019  . Left hand weakness 07/29/2019  . Palpitations 09/15/2015  . Diastolic dysfunction 09/15/2015  . Renal cyst,  left 06/19/2013  . History of migraine headaches 03/05/2013  . Complicated migraine 56/81/2751  . HLD (hyperlipidemia) 03/05/2013  . 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 Rotator cuff syndrome 03/05/2013  . Low back pain 03/05/2013    Carey Bullocks, OTR/L 09/25/2019, 11:02 AM  [facility] [address] [place], [place], [address] Phone: [phone]   Fax:  [phone]  Name: Tara [name] MRN: [id number] Date of Birth: 1965/07/02

## 2019-09-29 ENCOUNTER — Ambulatory Visit: Payer: 59 | Admitting: Neurology

## 2019-09-29 ENCOUNTER — Encounter: Payer: Self-pay | Admitting: Neurology

## 2019-09-29 VITALS — BP 116/77 | HR 87 | Temp 97.7°F | Ht 64.0 in | Wt 161.0 lb

## 2019-09-29 DIAGNOSIS — R42 Dizziness and giddiness: Secondary | ICD-10-CM

## 2019-09-29 DIAGNOSIS — F70 Mild intellectual disabilities: Secondary | ICD-10-CM | POA: Diagnosis not present

## 2019-09-29 NOTE — Patient Instructions (Addendum)
I had a long discussion with the patient with regards to her complaints of cognitive slowing, fatigue and dizziness following her stroke in March 2021.  She has only mild cognitive impairment on testing today and does not appear to be significantly depressed.  I recommend she continue ongoing physical occupational and start speech therapy for cognition as well.  She was advised to stay out of work till June 6 till her appointment with her primary care physician Dr. Modesto Charon.  She will stay on aspirin for stroke prevention and maintain aggressive risk factor modification with strict control of hypertension blood pressure goal below 130/90, lipids with LDL cholesterol goal below 70 mg percent and diabetes with hemoglobin A1c goal below 6.5%.  She was also advised to consider possible participation in the [place] trial for stroke prevention if interested and was given information to review and decide.

## 2019-09-29 NOTE — Progress Notes (Signed)
[facility] [address] [place]. [place] [address] [phone]       HOSPITAL FOLLOW UP NOTE  [name] Date of Birth:  1966-03-27 Medical Record Number:  [id number]   Reason for Referral:  hospital stroke follow up    CHIEF COMPLAINT:  Chief Complaint  Patient presents with  . Referral    PEr Janett Billow NP pt was sent for Dr.Adisyn Ruscitti to evaluate pts work ability, pt stated therapy stated her work leave should be extended pt is in PT OT and St therapy    HPI: Tara [name] being seen today for in office hospital follow-up regarding cryptogenic right parietal infarcts on 07/29/2019.  History obtained from patient and chart review. Reviewed all radiology images and labs personally.  [name] is a 54 y.o. female with history of HLD, migraine and asthma who presented with HA since 2/28 who woke w/ LUE numbness and weakness, severe dysarthria and dysphagia on 07/29/2019.  Evaluated by stroke team and Dr. Leonie Man with stroke work-up revealing 2 tiny right parietal infarcts embolic secondary to unknown source.  In addition to acute infarcts, MRI also showed several small chronic infarcts in the cerebellum but per review, Dr Leonie Man these were likely flow-voids and not old strokes.  TEE did not show evidence of PFO.  Recommended further evaluation with TCD which was suggestive of medium to large PFO.  Recommended further discussion outpatient regarding possible PFO closure.  Loop recorder placed to rule out atrial fibrillation as possible cause of stroke.  Hypercoagulable work-up negative.  Recommended DAPT for 3 weeks and aspirin alone.  LDL 151 and recommended increasing Crestor frequency to 40 mg daily.  No history of HTN or DM.  Other stroke risk factors include migraines but no prior history of stroke.  Discharged home in stable condition without therapy needs.  Tara [name] is a 54 year old female who is being seen today for hospital follow-up.  Residual stroke  deficits of mild left-sided weakness, dizziness with quick head movements and unsteadiness on feet. She does endorse some improvement but recently just started therapies. She does endorse increased fatigue since discharge and feels like her mind is slower/delayed.  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  She initially had FMLA which expired on 3/15 and she is requesting for extension.  She is eager to continue with therapies for ongoing improvement.  She continues on DAPT but will be completing 3-week Plavix course in the near future.  Denies bleeding or bruising.  She continues on Crestor 40 mg daily but does endorse myalgias bilateral upper extremities and torso.  Blood pressure today 118/88.  Loop recorder has not shown atrial fibrillation thus far.  No further concerns at this time.  Update 09/29/2019 ; patient is seen upon request that she call the office requesting medical leave be prolonged as she is having cognitive difficulties and fatigue and dizziness.  She states that she has been getting outpatient physical occupational therapy and but complains of a transient positional dizziness when she looks down or turns her neck quickly.  She is been getting some vestibular therapy which has been helping.  She was recently referred to speech therapy for mental status slowness and progressing with appointment has not happened yet.  She feels overall there is slow and steady progress but she is not ready to return to work yet.  She wants to be out of work till her next assessment  with her primary physician Dr. Modesto Charon which is scheduled for June 6.  She has not had any recurrent stroke or TIA symptoms.  She does complain of some intermittent numbness and tightness in her middle 2 fingers in the left hand particularly when she is tired.  She is tolerating aspirin well without bruising or bleeding.  Blood pressures well controlled today it is 116/77.  She had to reduce the dose  of Crestor to 20 mg that she had some muscle aches and pains which appear not to have improved.  She does take coenzyme Q 10 as well.  She denies any headache, slurred speech, increasing gait or balance problems.   ROS:   14 system review of systems performed and negative with exception of fatigue, weakness, dizziness, difficulty in processing.  Mental slowing and gait impairment  PMH:  Past Medical History:  Diagnosis Date  . Asthma   . Hyperlipidemia   . Migraine   . Rotator cuff tear   . Spondylolisthesis    L4-5    PSH:  Past Surgical History:  Procedure Laterality Date  . BUBBLE STUDY  07/30/2019   Procedure: BUBBLE STUDY;  Surgeon: [name], MD;  Location[facility] ENDOSCOPY;  Service: Cardiovascular;;  . LOOP RECORDER INSERTION N/A 07/30/2019   Procedure: LOOP RECORDER INSERTION;  Surgeon: [name], MD;  Location: [facility] INVASIVE CV LAB;  Service: Cardiovascular;  Laterality: N/A;  . No prior surgery    . TEE WITHOUT CARDIOVERSION N/A 07/30/2019   Procedure: TRANSESOPHAGEAL ECHOCARDIOGRAM (TEE);  Surgeon: [name], MD;  Location: [facility] ENDOSCOPY;  Service: Cardiovascular;  Laterality: N/A;    Social History:  Social History   Socioeconomic History  . Marital status: Married    Spouse name: Not on file  . Number of children: 2  . Years of education: BSN  . Highest education level: Not on file  Occupational History  . Occupation: [employer]  Tobacco Use  . Smoking status: Never Smoker  . Smokeless tobacco: Never Used  Substance and Sexual Activity  . Alcohol use: No    Alcohol/week: 0.0 standard drinks    Comment: Rare  . Drug use: No  . Sexual activity: Yes    Birth control/protection: None  Other Topics Concern  . Not on file  Social History Narrative   Lives at home w/ her husband and son   Right-handed   Caffeine: seldom, soda on the weekend   Social Determinants of Health   Financial Resource Strain:   . Difficulty of Paying  Living Expenses:   Food Insecurity:   . Worried About Programme researcher, broadcasting/film/video in the Last Year:   . Barista in the Last Year:   Transportation Needs:   . Freight forwarder (Medical):   Marland Kitchen Lack of Transportation (Non-Medical):   Physical Activity:   . Days of Exercise per Week:   . Minutes of Exercise per Session:   Stress:   . Feeling of Stress :   Social Connections:   . Frequency of Communication with Friends and Family:   . Frequency of Social Gatherings with Friends and Family:   . Attends Religious Services:   . Active Member of Clubs or Organizations:   . Attends Banker Meetings:   Marland Kitchen Marital Status:   Intimate Partner Violence:   . Fear of Current or Ex-Partner:   . Emotionally Abused:   Marland Kitchen Physically Abused:   . Sexually Abused:  Family History:  Family History  Problem Relation Age of Onset  . Heart disease Father 48       MI  . Hypertension Maternal Grandmother   . Hyperlipidemia Brother     Medications:   Current Outpatient Medications on File Prior to Visit  Medication Sig Dispense Refill  . aspirin EC 81 MG EC tablet Take 1 tablet (81 mg total) by mouth daily.    . cholecalciferol (VITAMIN D) 1000 UNITS tablet Take 1,000 Units by mouth daily.    . Omega-3 Fatty Acids (FISH OIL) 500 MG CAPS Natures made Fish oil pearls 500mg  BID (Patient taking differently: Take 1 capsule by mouth daily. Natures made Fish oil pearls 500mg  BID) 180 capsule 1  . Probiotic Product (PROBIOTIC PO) Take 1 capsule by mouth daily.     . rizatriptan (MAXALT) 10 MG tablet Take 10 mg by mouth as needed for migraine (headache).     . rosuvastatin (CRESTOR) 20 MG tablet Take 1 tablet (20 mg total) by mouth daily. 90 tablet 3   No current facility-administered medications on file prior to visit.    Allergies:   Allergies  Allergen Reactions  . Tape Rash     Physical Exam  Vitals:   09/29/19 1553  BP: 116/77  Pulse: 87  Temp: 97.7 F (36.5 C)  Weight:  73 kg  Height: 5\' 4"  (1.626 m)   Body mass index is 27.64 kg/m. No exam data present  General: well developed, well nourished, pleasant middle-age female, seated, in no evident distress Head: head normocephalic and atraumatic.   Neck: supple with no carotid or supraclavicular bruits Cardiovascular: regular rate and rhythm, no murmurs Musculoskeletal: no deformity Skin:  no rash/petichiae Vascular:  Normal pulses all extremities   Neurologic Exam Mental Status: Awake and fully alert.   Normal speech and language.  Oriented to place and time. Recent and remote memory intact. Attention span, concentration and fund of knowledge appropriate. Mood and affect appropriate.  Montreal cognitive assessment she scored 27/30 with   deficist in delayed recall and language.  Clock drawing 4/4.  On depression scale she scored 3 not depressed Cranial Nerves: Fundoscopic exam reveals sharp disc margin not done s. Pupils equal, briskly reactive to light. Extraocular movements full without nystagmus. Visual fields full to confrontation. Hearing intact. Facial sensation intact. Face, tongue, palate moves normally and symmetrically.  Motor: Normal bulk and tone. Normal strength in all tested extremity muscles except mild diminished fine finger movements on the left and orbits right over left upper extremity  and decreased left hand dexterity. Sensory.: intact to touch , pinprick , position and vibratory sensation.  Coordination: Rapid alternating movements normal in all extremities except decreased left hand. Finger-to-nose and heel-to-shin performed accurately bilaterally. Gait and Station: Arises from chair without difficulty. Stance is normal. Gait demonstrates normal stride length with moderate imbalance Reflexes: 1+ and symmetric. Toes downgoing.     NIHSS  1 Modified Rankin  2    Diagnostic Data (Labs, Imaging, Testing)   Code Stroke CT head hyperdense R MCA bifurcation. No acute abnormality. R  maxillary sinus dz. ASPECTS 10.     CTA head & neck no LVO. Mild distal SMALL VESSEL DISEASE anterior and posterior circulation. Neck ok   CT perfusion Unremarkable   MRI  2 tiny acute cortical R brain (posterior middle frontal gyrus and posterior R parietal lobe). Read as old cerebellar infarcts but  Dr. feels they are flow voids and not old stroke. Mild  paranasal sinus dz.   2D Echo normal   TEE  PFO present  EEG normal   LE dopplers no DVT   LDL 151 mg%  HgbA1c 5.3  UDS not done   TSH normal    ASSESSMENT: Tara [name] is a 54 y.o. year old female presented with LUE numbness and weakness along with severe dysarthria and dysphagia on 07/29/2019 with stroke work-up revealing 2 tiny right parietal infarcts embolic secondary to unknown source.  Loop recorder placed which has not shown atrial fibrillation thus far.  Also evidence of PFO with unclear relation to stroke.  Vascular risk factors include HLD and migraines.  Mild residual complaints of cognitive difficulties, dizziness and fatigue.  She has of small PFO for which she wants to wait on elective endovascular closure.   PLAN:  I had a long discussion with the patient with regards to her complaints of cognitive slowing, fatigue and dizziness following her stroke in March 2021.  She has only mild cognitive impairment on testing today and does not appear to be significantly depressed.  I recommend she continue ongoing physical occupational and start speech therapy for cognition as well.  She was advised to stay out of work till June 6 till her appointment with her primary care physician Dr. Modesto Charon.  She will stay on aspirin for stroke prevention and maintain aggressive risk factor modification with strict control of hypertension blood pressure goal below 130/90, lipids with LDL cholesterol goal below 70 mg percent and diabetes with hemoglobin A1c goal below 6.5%.  She was also advised to consider possible participation in the  [place] trial for stroke prevention if interested and was given information to review and decide.  She was advised to continue follow-up with physical therapy for vestibular rehab for her dizziness. Continue elective follow-up for a PFO for now but may consider closure in the future when she is ready Greater than 50% time during this 30-minute follow-up visit was spent on counseling and coordination of care.  .  About her cryptogenic stroke and discussion about her cognitive difficulties and dizziness and answering questions.   Delia Heady, MD  [facility] [address] [place], [place] [address]  Phone [phone] Fax [phone] Note: This document was prepared with digital dictation and possible smart phrase technology. Any transcriptional errors that result from this process are unintentional.

## 2019-09-29 NOTE — Telephone Encounter (Signed)
Ok will do.

## 2019-09-30 ENCOUNTER — Ambulatory Visit: Payer: 59 | Attending: Family Medicine

## 2019-09-30 DIAGNOSIS — R42 Dizziness and giddiness: Secondary | ICD-10-CM | POA: Insufficient documentation

## 2019-09-30 DIAGNOSIS — R278 Other lack of coordination: Secondary | ICD-10-CM | POA: Insufficient documentation

## 2019-09-30 DIAGNOSIS — I69318 Other symptoms and signs involving cognitive functions following cerebral infarction: Secondary | ICD-10-CM | POA: Insufficient documentation

## 2019-09-30 DIAGNOSIS — R2689 Other abnormalities of gait and mobility: Secondary | ICD-10-CM | POA: Insufficient documentation

## 2019-09-30 DIAGNOSIS — R2681 Unsteadiness on feet: Secondary | ICD-10-CM | POA: Insufficient documentation

## 2019-09-30 DIAGNOSIS — M6281 Muscle weakness (generalized): Secondary | ICD-10-CM | POA: Insufficient documentation

## 2019-09-30 DIAGNOSIS — R471 Dysarthria and anarthria: Secondary | ICD-10-CM | POA: Insufficient documentation

## 2019-09-30 DIAGNOSIS — R41841 Cognitive communication deficit: Secondary | ICD-10-CM | POA: Insufficient documentation

## 2019-09-30 NOTE — Telephone Encounter (Signed)
Work Museum/gallery exhibitions officer to Loni Dolly at [employer] for pt to be out of work till November 02, 2019. Pt will be assess by PCP for work capability ongoing if needed. Fax twice and confirmed.

## 2019-10-01 ENCOUNTER — Ambulatory Visit: Payer: 59 | Admitting: Speech Pathology

## 2019-10-01 ENCOUNTER — Other Ambulatory Visit: Payer: Self-pay

## 2019-10-01 DIAGNOSIS — R42 Dizziness and giddiness: Secondary | ICD-10-CM | POA: Diagnosis not present

## 2019-10-01 DIAGNOSIS — R41841 Cognitive communication deficit: Secondary | ICD-10-CM | POA: Diagnosis not present

## 2019-10-01 DIAGNOSIS — R2681 Unsteadiness on feet: Secondary | ICD-10-CM | POA: Diagnosis not present

## 2019-10-01 DIAGNOSIS — M6281 Muscle weakness (generalized): Secondary | ICD-10-CM | POA: Diagnosis not present

## 2019-10-01 DIAGNOSIS — I69318 Other symptoms and signs involving cognitive functions following cerebral infarction: Secondary | ICD-10-CM | POA: Diagnosis present

## 2019-10-01 DIAGNOSIS — R2689 Other abnormalities of gait and mobility: Secondary | ICD-10-CM | POA: Diagnosis not present

## 2019-10-01 DIAGNOSIS — R278 Other lack of coordination: Secondary | ICD-10-CM | POA: Diagnosis present

## 2019-10-01 DIAGNOSIS — R471 Dysarthria and anarthria: Secondary | ICD-10-CM | POA: Diagnosis present

## 2019-10-01 NOTE — Patient Instructions (Signed)
   Tips to help facilitate better attention, concentration, focus   Do harder, longer tasks when you are most alert/awake  Break down larger tasks into small parts  Limit distractions of TV, radio, conversation, e mails/texts, appliance noise, etc - if a job is important, do it in a quiet room  Be aware of how you are functioning in high stimulation environments such as large stores, parties, restaurants - any place with lots of lights, noise, signs etc  Group conversations may be more difficult to process than one on one conversations  Give yourself extra time to process conversation, reading materials, directions or information from your healthcare providers  Organization is key - clutters of laundry, mail, paperwork, dirty dishes - all make it more difficult to concentrate  Before you start a task, have all the needed supplies, directions, recipes ready and organized. This way you don't have to go looking for something in the middle of a task and become distracted.   Be aware of fatigue - take rests or breaks when needed to re-group and re-focus  

## 2019-10-02 LAB — CUP PACEART REMOTE DEVICE CHECK
Date Time Interrogation Session: 20210504232442
Implantable Pulse Generator Implant Date: 20210303

## 2019-10-03 ENCOUNTER — Other Ambulatory Visit: Payer: Self-pay

## 2019-10-03 ENCOUNTER — Ambulatory Visit: Payer: 59 | Admitting: Physical Therapy

## 2019-10-03 DIAGNOSIS — R41841 Cognitive communication deficit: Secondary | ICD-10-CM | POA: Diagnosis not present

## 2019-10-03 DIAGNOSIS — R2689 Other abnormalities of gait and mobility: Secondary | ICD-10-CM

## 2019-10-03 DIAGNOSIS — I69318 Other symptoms and signs involving cognitive functions following cerebral infarction: Secondary | ICD-10-CM | POA: Diagnosis not present

## 2019-10-03 DIAGNOSIS — R278 Other lack of coordination: Secondary | ICD-10-CM | POA: Diagnosis not present

## 2019-10-03 DIAGNOSIS — R2681 Unsteadiness on feet: Secondary | ICD-10-CM | POA: Diagnosis not present

## 2019-10-03 DIAGNOSIS — R42 Dizziness and giddiness: Secondary | ICD-10-CM | POA: Diagnosis not present

## 2019-10-03 DIAGNOSIS — M6281 Muscle weakness (generalized): Secondary | ICD-10-CM

## 2019-10-03 DIAGNOSIS — R471 Dysarthria and anarthria: Secondary | ICD-10-CM | POA: Diagnosis not present

## 2019-10-03 NOTE — Therapy (Addendum)
[facility] [address] [place], [place], [address] Phone: [phone]   Fax:  [phone]  Physical Therapy Treatment  Patient Details  Name: Tara [name] MRN: [id number] Date of Birth: Nov 15, 1965 No data recorded  Encounter Date: 10/03/2019  PT End of Session - 10/03/19 1531    Visit Number  14    Number of Visits  17    Authorization Type  [facility]    PT Start Time  1448    PT Stop Time  1532    PT Time Calculation (min)  44 min    Equipment Utilized During Treatment  Gait belt    Activity Tolerance  Patient tolerated treatment well   limited by dizziness; at worst, 2/10   Behavior During Therapy  [facility] for tasks assessed/performed       Past Medical History:  Diagnosis Date  . Asthma   . Hyperlipidemia   . Migraine   . Rotator cuff tear   . Spondylolisthesis    L4-5    Past Surgical History:  Procedure Laterality Date  . BUBBLE STUDY  07/30/2019   Procedure: BUBBLE STUDY;  Surgeon: Donato Heinz, MD;  Location: [facility];  Service: Cardiovascular;;  . LOOP RECORDER INSERTION N/A 07/30/2019   Procedure: LOOP RECORDER INSERTION;  Surgeon: Evans Lance, MD;  Location: [facility] CV LAB;  Service: Cardiovascular;  Laterality: N/A;  . No prior surgery    . TEE WITHOUT CARDIOVERSION N/A 07/30/2019   Procedure: TRANSESOPHAGEAL ECHOCARDIOGRAM (TEE);  Surgeon: Donato Heinz, MD;  Location: [facility] ENDOSCOPY;  Service: Cardiovascular;  Laterality: N/A;    There were no vitals filed for this visit.  Subjective Assessment - 10/03/19 1450    Subjective  Had a bad dizzy spell yesterday. Was shopping in [place]. Felt nauseuous afterwards and her husband had to drive home and then had to go home and take a nap. Saw Dr. Leonie Man the other day who recommended to continue with therapy and to make sure she is resting. Not a lot of dizziness today - very slight today.    Patient Stated Goals  Pt's goals for  therapy are to strengthen to help with the tired feeling.    Currently in Pain?  No/denies    Pain Onset  1 to 4 weeks ago                       [facility] Adult PT Treatment/Exercise - 10/03/19 0001      Ambulation/Gait   Ambulation/Gait  Yes    Ambulation/Gait Assistance  5: Supervision;4: Min guard    Ambulation Distance (Feet)  230 Feet    Assistive device  None    Gait Pattern  Step-through pattern;Decreased arm swing - right;Decreased arm swing - left;Decreased step length - left;Decreased step length - right;Decreased stride length;Decreased trunk rotation    Ambulation Surface  Level;Indoor    Gait Comments  ambulating in and out of cones in figure 8 pattern 2 x 20' reps, with cues for increased step length and arm swing, min guard for balance, pt more guarded 1/10 dizziness. Additional gait with pt holding trekking poles with PT posterior to help facilitate arm swing/trunk rotation due to pt continuing to remain guarded with gait. Cues for incr step length B. Pt reporting 2/10 dizziness afterwards and needing a seated rest break       Vestibular Treatment/Exercise - 10/03/19 1500      X1 Viewing Horizontal  Foot Position  standing with UE support on chair, feet apart    Reps  10    Comments  x3 reps, cues for slightly incr speed and proper ROM, 1/10 dizziness during first 2nd reps, 2/10 dizziness during last rep - upgraded to standing for HEP      X1 Viewing Vertical   Foot Position  standing with UE support on chair    Reps  10    Comments  x3, 1/10 dizziness - upgraded for HEP         Balance Exercises - 10/03/19 1528      Balance Exercises: Standing   Standing Eyes Closed  Wide (BOA);Foam/compliant surface    Standing Eyes Closed Limitations  on foam in corner for incr vestibular input for balance, intermittent fingertip support, 3 x 20 second reps, pt reporting at most 1/10 dizziness         10/05/19 1737  PT Education  Education Details upgraded  VOR x1 exercise for HEP to standing  Person(s) Educated Patient  Methods Explanation;Demonstration;Handout;Verbal cues  Comprehension Verbalized understanding;Returned demonstration;Verbal cues required     PT Short Term Goals - 09/10/19 0944      PT SHORT TERM GOAL #1   Title  Pt will be independent with HEP for improved strength, balance, and gait.  TARGET for all STGs:  4 weeks:  09/05/2019    Time  4    Period  Weeks    Status  Achieved      PT SHORT TERM GOAL #2   Title  Pt will improve 5x sit<>stand to less than or equal to 15 seconds for improved functional lower extremity strength.    Baseline  21.68 seconds on 09/03/19    Time  4    Period  Weeks    Status  Not Met      PT SHORT TERM GOAL #3   Title  Pt will improve DGI score to at least 16/24 for decresaed fall risk.    Baseline  10/24 on 09/10/19    Time  4    Period  Weeks    Status  Not Met      PT SHORT TERM GOAL #4   Title  Pt will verbalize understanding of:  fall prevention in home environment and CVA warning signs/symptoms    Time  4    Period  Weeks    Status  Achieved        PT Long Term Goals - 09/25/19 1210      PT LONG TERM GOAL #1   Title  Pt will be independent with progression of HEP to address strength, balance, gait, and vestibular impairments.  TARGET 10/23/2019 (may be delayed due to scheduling)    Baseline  pt will continue to benefit from revisions/additions to HEP    Time  4    Period  Weeks    Status  On-going    Target Date  10/23/19      PT LONG TERM GOAL #2   Title  Pt will improve 5x sit<>stand to less than or equal to 22 seconds for improved functional strength.    Baseline  38.9 seconds with no UE support from standard chair    Time  4    Period  Weeks    Status  Revised      PT LONG TERM GOAL #3   Title  Pt will improve DGI score to at least 13/24 for decreased fall risk and improved functional  mobility.    Baseline  9/24 on 09/25/19    Time  4    Period  Weeks    Status   Revised      PT LONG TERM GOAL #4   Title  Pt will ambulate at least 500 ft, indoor and outdoor surfaces, with supervision and no LOB for return to independent community gait.    Baseline  not performed today due to time constraints    Time  4    Period  Weeks    Status  Revised      PT LONG TERM GOAL #5   Title  Pt will improve gait speed to at least 1.9 ft/sec for improved gait efficiency and decr fall risk.    Baseline  1.57 ft/sec on 09/25/19    Time  4    Period  Weeks    Status  New           10/05/19 1739  Plan  Clinical Impression Statement Able to upgrade VOR x1 in horizonal and vertical direction to standing with BUE support on chair with feet apart, with pt rating dizziness at most 2/10. Use of trekking poles used today for PT to help facilitate arm swing and trunk rotation  due to pt continuing to remain guarded with gait. Pt rating 2/10 dizziness afterwards. Needed a couple seated/standing rest breaks for dizziness to subside. Will continue to progress towards LTGs.  Personal Factors and Comorbidities Comorbidity 3+  Comorbidities PMH:  asthma, hyperlipidemia, spondyloslisthesis, R RTC tear  Examination-Activity Limitations Locomotion Level;Transfers;Stand;Stairs  Examination-Participation Restrictions Community Activity;Yard Work;Other (walking for exercise in neighborhood, return to work)  Pt will benefit from skilled therapeutic intervention in order to improve on the following deficits Abnormal gait;Difficulty walking;Decreased endurance;Decreased balance;Decreased activity tolerance;Decreased strength;Decreased mobility  Stability/Clinical Decision Making Evolving/Moderate complexity  Rehab Potential Good  PT Frequency 1x / week  PT Duration 4 weeks  PT Treatment/Interventions ADLs/Self Care Home Management;Gait training;Stair training;Functional mobility training;Therapeutic activities;Therapeutic exercise;Balance training;Neuromuscular re-education;DME  Instruction;Patient/family education;Electrical Stimulation;Orthotic Fit/Training;Vestibular  PT Next Visit Plan work on how was VOR x1 to HEP? weight shifting, continue to work on standing balance on compliant surface - eyes open with head motions (in gentle ranges), eyes closed balance on compliant surface - with wide BOS. environmental scanning. practice turning - try 180 and 360 degree slowly.  Consulted and Agree with Plan of Care Patient        Patient will benefit from skilled therapeutic intervention in order to improve the following deficits and impairments:     Visit Diagnosis: Muscle weakness (generalized)  Other lack of coordination  Dizziness and giddiness  Other abnormalities of gait and mobility  Unsteadiness on feet     Problem List Patient Active Problem List   Diagnosis Date Noted  . CVA (cerebral vascular accident) ([facility]) 07/29/2019  . Dysphagia 07/29/2019  . Left hand weakness 07/29/2019  . Palpitations 09/15/2015  . Diastolic dysfunction 75/64/3329  . Renal cyst, left 06/19/2013  . History of migraine headaches 03/05/2013  . Complicated migraine 51/88/4166  . HLD (hyperlipidemia) 03/05/2013  . 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 Rotator cuff syndrome 03/05/2013  . Low back pain 03/05/2013    Arliss Journey, PT, DPT  10/03/2019, 4:18 PM  [facility] [address] [place], [place], [address] Phone: [phone]   Fax:  [phone]  Name: Tara [name] MRN: [id number] Date of Birth: [birth year]

## 2019-10-03 NOTE — Therapy (Signed)
[facility] [address] [place], [place], [address] Phone: [phone]   Fax:  [phone]  Speech Language Pathology Evaluation  Patient Details  Name: Tara MICHAELIS MRN: [id number] Date of Birth: 1965-07-19 Referring Provider (SLP): Ihor Austin NP   Encounter Date: 10/01/2019  End of Session - 10/03/19 1043    Visit Number  1    Number of Visits  17    Date for SLP Re-Evaluation  11/26/19    Authorization Type  none    SLP Start Time  1147    SLP Stop Time   1233    SLP Time Calculation (min)  46 min    Activity Tolerance  Patient tolerated treatment well;Other (comment)   increased h/a and nausea      Past Medical History:  Diagnosis Date  . Asthma   . Hyperlipidemia   . Migraine   . Rotator cuff tear   . Spondylolisthesis    L4-5    Past Surgical History:  Procedure Laterality Date  . BUBBLE STUDY  07/30/2019   Procedure: BUBBLE STUDY;  Surgeon: [name], MD;  Location: [facility] ENDOSCOPY;  Service: Cardiovascular;;  . LOOP RECORDER INSERTION N/A 07/30/2019   Procedure: LOOP RECORDER INSERTION;  Surgeon: [name], MD;  Location: [facility] INVASIVE CV LAB;  Service: Cardiovascular;  Laterality: N/A;  . No prior surgery    . TEE WITHOUT CARDIOVERSION N/A 07/30/2019   Procedure: TRANSESOPHAGEAL ECHOCARDIOGRAM (TEE);  Surgeon: [name], MD;  Location: [facility] ENDOSCOPY;  Service: Cardiovascular;  Laterality: N/A;    There were no vitals filed for this visit.  Subjective Assessment - 10/03/19 1041    Subjective  "I stop a task and then I come back and the task isn't done"    Currently in Pain?  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   No    Is the patient reluctant to leave their home because of a fear of falling?   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     Cognition   Overall Cognitive Status  Impaired/Different from baseline  Area of Impairment  Attention;Memory;Problem solving    Current Attention Level  Sustained    Memory  Decreased short-term memory    Problem Solving  Slow processing;Requires verbal cues;Difficulty sequencing    Attention  Selective;Alternating    Selective Attention  Impaired    Selective Attention Impairment  Verbal complex;Functional basic    Alternating Attention  Impaired    Alternating Attention Impairment  Verbal basic;Functional basic      Auditory Comprehension   Overall Auditory Comprehension  Appears within functional limits for tasks assessed    Interfering Components  Attention;Processing speed;Working memory      Reading Comprehension   Reading Status  Impaired    Word level  76-100% accurate    Sentence Level  76-100% accurate    Paragraph Level  51-75% accurate    Functional Environmental (signs, name badge)  Within functional limits    Interfering Components  Attention;Processing time;Working Copy;Visual cueing      Expression   Primary Mode of Expression  Verbal      Verbal  Expression   Overall Verbal Expression  Impaired    Initiation  No impairment    Level of Generative/Spontaneous Verbalization  Conversation    Repetition  No impairment    Naming  Impairment    Responsive  76-100% accurate    Confrontation  75-100% accurate    Convergent  75-100% accurate    Divergent  75-100% accurate    Interfering Components  Attention      Written Expression   Dominant Hand  Right    Written Expression  Not tested      Oral Motor/Sensory Function   Overall Oral Motor/Sensory Function  Appears within functional limits for tasks assessed      Motor Speech   Overall Motor Speech  Appears within functional limits for tasks assessed      Standardized Assessments   Standardized Assessments   Cognitive Linguistic Quick Test      Cognitive Linguistic Quick Test (Ages 18-69)   Attention  Mild    Memory  Moderate    Executive Function  WNL    Language  WNL    Visuospatial Skills  Mild    Severity Rating Total  16    Composite Severity Rating  13.6                      SLP Education - 10/03/19 1043    Education Details  areas of impairment, goals, compensations for attention    Person(s) Educated  Patient    Methods  Explanation;Demonstration;Verbal cues;Handout    Comprehension  Verbal cues required;Need further instruction       SLP Short Term Goals - 10/03/19 1046      SLP SHORT TERM GOAL #1   Title  Pt will utiltize external aids to schedule and complete daily tasks with rare min A over 3 sessions    Time  4    Period  Weeks    Status  New      SLP SHORT TERM GOAL #2   Title  Pt will uitlize compensatory strategies to attend to and process verbal instructions and converstion accurately with rare min A over 2 sessions    Time  4    Period  Weeks    Status  New      SLP SHORT TERM GOAL #3   Title  Pt will read 1 paragraph (5-7  sentences) and recall details with rare min A over 2 sessions    Time  4    Period  Weeks    Status   New       SLP Long Term Goals - 10/03/19 1050      SLP LONG TERM GOAL #1   Title  Pt will utilize compensations for attention to prepare a main dish for 3 meals with rare min A    Time  8    Period  Weeks    Status  New      SLP LONG TERM GOAL #2   Title  Pt will carryover compensatory strategies for attention and processing to run 3 errands in a row successfully with rare min A    Time  8    Period  Weeks    Status  New      SLP LONG TERM GOAL #3   Title  Pt will return to ADL/IADL task after a distraction and succesfully complete task before starting a new activity with rare min A over 3 sessions    Time  8    Period  Weeks    Status  New       Plan - 10/03/19 1044    Clinical Impression Statement  Flo Berroa presents to outpt ST with mild to moderate cognitive linguistic impairments following CVA on 07/29/19. Prior to CVA she was independent with all IADL's and working full time. Mily reports she is easily distracted, leaving tasks uncompleted. She looses track of her task when the phone rings and states that she gets distracted in conversations and looses her words. She affirms light sensitivity and increased dizziness in stores. She reports anxiety about getting to do list completed each day.Cognitive fatigue and brain fog present after concentrating for short period. At this time, Terrina is only able to read a couple sentences in a paragraph due to loosing focus with poor attention.  She is very concerned about being able to return to work due to her poor attention. The Cognitive Linguistic Quick Test revealed mild attention and visuospatial impairments. Memory was moderately impaired, however I believe this was affected by slow processing and attention as she didn't respond to stimuli within alloted time on visual memory test. Speech has low volume and affect relatively flat. I recommend skilled ST to maximize cognitive communication for success with IADL's and possible  return to work in future.    Speech Therapy Frequency  2x / week    Duration  --   8 weeks or 17 visits   Treatment/Interventions  Language facilitation;Environmental controls;Cueing hierarchy;SLP instruction and feedback;Compensatory strategies;Functional tasks;Cognitive reorganization;Patient/family education;Multimodal communcation approach;Internal/external aids;Compensatory techniques    Potential to Achieve Goals  Good       Patient will benefit from skilled therapeutic intervention in order to improve the following deficits and impairments:   Cognitive communication deficit    Problem List Patient Active Problem List   Diagnosis Date Noted  . CVA (cerebral vascular accident) (HCC) 07/29/2019  . Dysphagia 07/29/2019  . Left hand weakness 07/29/2019  . Palpitations 09/15/2015  . Diastolic dysfunction 09/15/2015  . Renal cyst, left 06/19/2013  . History of migraine headaches 03/05/2013  . 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 HLD (hyperlipidemia) 03/05/2013  . 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 Rotator cuff syndrome 03/05/2013  . Low back pain 03/05/2013    Kentarius Partington, Radene Journey MS, CCC-SLP 10/03/2019, 10:54 AM  [facility] [address] [place],  [place], [address] Phone: [phone]   Fax:  [phone]  Name: MEEGHAN SKIPPER MRN: [id number] Date of Birth: 1966/03/07

## 2019-10-03 NOTE — Patient Instructions (Signed)
Access Code: [email or web address] URL: [email or web address] Date: 10/03/2019 Prepared by: Sherlie Ban  Exercises Sit to Stand - 1-2 x daily - 5 x weekly - 3 sets - 5 reps Standing Marching - 1-2 x daily - 5 x weekly - 3 sets Standing Balance with Eyes Closed - 2 x daily - 5 x weekly - 3 sets - 15 hold Tandem Stance - 1 x daily - 5 x weekly - 3 sets - 20 hold Seated Nose to Left Knee Vestibular Habituation - 2 x daily - 5 x weekly - 1 sets - 3-4 reps Seated Nose to Right Knee Vestibular Habituation - 2 x daily - 5 x weekly - 1 sets - 3-4 reps Vestibular Habituation - Seated Horizontal Head Rotation - 1 x daily - 5 x weekly - 3 sets - 5 reps Seated Head Nods Vestibular Habituation - 1 x daily - 5 x weekly - 3 sets - 5 reps Lateral Weight Shift with Arm Raise - 1 x daily - 5 x weekly - 1 sets - 10 reps Standing Quarter Turn with Counter Support - 1 x daily - 5 x weekly - 1 sets - 5 reps Standing Gaze Stabilization with Head Rotation - 1-2 x daily - 5 x weekly - 3 sets - 10 reps

## 2019-10-06 ENCOUNTER — Ambulatory Visit (INDEPENDENT_AMBULATORY_CARE_PROVIDER_SITE_OTHER): Payer: 59 | Admitting: *Deleted

## 2019-10-06 DIAGNOSIS — I639 Cerebral infarction, unspecified: Secondary | ICD-10-CM | POA: Diagnosis not present

## 2019-10-06 NOTE — Progress Notes (Signed)
[facility] Summary Report / Loop Recorder 

## 2019-10-07 ENCOUNTER — Encounter: Payer: 59 | Admitting: Speech Pathology

## 2019-10-07 ENCOUNTER — Ambulatory Visit: Payer: 59 | Admitting: Occupational Therapy

## 2019-10-07 ENCOUNTER — Ambulatory Visit: Payer: 59 | Admitting: Physical Therapy

## 2019-10-07 ENCOUNTER — Other Ambulatory Visit: Payer: Self-pay

## 2019-10-07 DIAGNOSIS — R42 Dizziness and giddiness: Secondary | ICD-10-CM

## 2019-10-07 DIAGNOSIS — M6281 Muscle weakness (generalized): Secondary | ICD-10-CM

## 2019-10-07 DIAGNOSIS — R278 Other lack of coordination: Secondary | ICD-10-CM | POA: Diagnosis not present

## 2019-10-07 DIAGNOSIS — R2689 Other abnormalities of gait and mobility: Secondary | ICD-10-CM | POA: Diagnosis not present

## 2019-10-07 DIAGNOSIS — R2681 Unsteadiness on feet: Secondary | ICD-10-CM | POA: Diagnosis not present

## 2019-10-07 DIAGNOSIS — R41841 Cognitive communication deficit: Secondary | ICD-10-CM | POA: Diagnosis not present

## 2019-10-07 DIAGNOSIS — I69318 Other symptoms and signs involving cognitive functions following cerebral infarction: Secondary | ICD-10-CM | POA: Diagnosis not present

## 2019-10-07 DIAGNOSIS — R471 Dysarthria and anarthria: Secondary | ICD-10-CM | POA: Diagnosis not present

## 2019-10-07 NOTE — Therapy (Signed)
[facility] [address] [place], [place], [address] Phone: [phone]   Fax:  [phone]  Physical Therapy Treatment  Patient Details  Name: Tara [name] MRN: [id number] Date of Birth: 1965-08-10 No data recorded  Encounter Date: 10/07/2019  PT End of Session - 10/07/19 1438    Visit Number  15    Number of Visits  17    Authorization Type  [facility] [employer]    PT Start Time  1320   pt arrived late   PT Stop Time  1401    PT Time Calculation (min)  41 min    Equipment Utilized During Treatment  Gait belt    Activity Tolerance  Patient tolerated treatment well   limited by dizziness; at worst, 2/10   Behavior During Therapy  [facility] for tasks assessed/performed       Past Medical History:  Diagnosis Date  . Asthma   . Hyperlipidemia   . Migraine   . Rotator cuff tear   . Spondylolisthesis    L4-5    Past Surgical History:  Procedure Laterality Date  . BUBBLE STUDY  07/30/2019   Procedure: BUBBLE STUDY;  Surgeon: Donato Heinz, MD;  Location: [facility];  Service: Cardiovascular;;  . LOOP RECORDER INSERTION N/A 07/30/2019   Procedure: LOOP RECORDER INSERTION;  Surgeon: Evans Lance, MD;  Location: [place] CV LAB;  Service: Cardiovascular;  Laterality: N/A;  . No prior surgery    . TEE WITHOUT CARDIOVERSION N/A 07/30/2019   Procedure: TRANSESOPHAGEAL ECHOCARDIOGRAM (TEE);  Surgeon: Donato Heinz, MD;  Location: [facility] ENDOSCOPY;  Service: Cardiovascular;  Laterality: N/A;    There were no vitals filed for this visit.  Subjective Assessment - 10/07/19 1322    Subjective  Dizziness is better today. Had some slight dizziness on Saturday.    Patient Stated Goals  Pt's goals for therapy are to strengthen to help with the tired feeling.    Currently in Pain?  No/denies    Pain Onset  1 to 4 weeks ago             Vestibular Assessment - 10/07/19 1327      Positional Sensitivities    Supine to Sitting  Lightheadedness   mild dizziness   Rolling Right  Lightheadedness   mild dizziness   Rolling Left  Lightheadedness   mild dizziness              [facility] Adult PT Treatment/Exercise - 10/07/19 0001      Transfers   Comments  Sit <> stands from standard arm chair with no UE support for functional strengthening, cues for anterior weight shift and posture and then with 3 reps of lateral weight shift at counter x4 reps overall and then sitting back down to chair with no UE support, one rep with reaching towards cabinets. Max 1-2/10 dizziness with activity.       Ambulation/Gait   Ambulation/Gait  Yes    Ambulation/Gait Assistance  5: Supervision;4: Min guard    Ambulation/Gait Assistance Details  between activities in session - cues for incr B arm swing for reciprocal arm swing and trunk rotation during gait as pt is normally more guarded    Ambulation Distance (Feet)  115 Feet   total throughout session   Assistive device  None    Gait Pattern  Step-through pattern;Decreased arm swing - right;Decreased arm swing - left;Decreased step length - left;Decreased step length - right;Decreased stride length;Decreased trunk rotation  Ambulation Surface  Level;Indoor      Vestibular Treatment/Exercise - 10/07/19 0001      Vestibular Treatment/Exercise   Vestibular Treatment Provided  Habituation    Habituation Exercises  Horizontal Roll      Horizontal Roll   Symptom Description   Due to motion sensitivity with rolling performed rolling right (head and body roll with knees bent in hooklying position) and left x4 reps each with gradually increasing speed when rolling, dizziness 1/10 but lasting shorter bouts of time with increased reps.          Balance Exercises - 10/07/19 1348      Balance Exercises: Standing   Other Standing Exercises Comments  rockerboard in M/L direction first holding on with weight shift towards R and L and then progressing with no UE  support x10 reps, with worst was 1-2/10 dizziness and pt reporting feeling swimmyheaded, performed an additional x10 reps with UE support with pt no longer reporting feeling swimmyheaded, but still had minimal dizziness.         PT Education - 10/07/19 1437    Education Details  continued to educate on importance of performing head/body movement during functional tasks (like when pt comes in to therapy to turn head while getting hand santitizer from wall)    Person(s) Educated  Patient    Methods  Explanation    Comprehension  Verbalized understanding       PT Short Term Goals - 09/10/19 0944      PT SHORT TERM GOAL #1   Title  Pt will be independent with HEP for improved strength, balance, and gait.  TARGET for all STGs:  4 weeks:  09/05/2019    Time  4    Period  Weeks    Status  Achieved      PT SHORT TERM GOAL #2   Title  Pt will improve 5x sit<>stand to less than or equal to 15 seconds for improved functional lower extremity strength.    Baseline  21.68 seconds on 09/03/19    Time  4    Period  Weeks    Status  Not Met      PT SHORT TERM GOAL #3   Title  Pt will improve DGI score to at least 16/24 for decresaed fall risk.    Baseline  10/24 on 09/10/19    Time  4    Period  Weeks    Status  Not Met      PT SHORT TERM GOAL #4   Title  Pt will verbalize understanding of:  fall prevention in home environment and CVA warning signs/symptoms    Time  4    Period  Weeks    Status  Achieved        PT Long Term Goals - 09/25/19 1210      PT LONG TERM GOAL #1   Title  Pt will be independent with progression of HEP to address strength, balance, gait, and vestibular impairments.  TARGET 10/23/2019 (may be delayed due to scheduling)    Baseline  pt will continue to benefit from revisions/additions to HEP    Time  4    Period  Weeks    Status  On-going    Target Date  10/23/19      PT LONG TERM GOAL #2   Title  Pt will improve 5x sit<>stand to less than or equal to 22 seconds  for improved functional strength.    Baseline  38.9 seconds  with no UE support from standard chair    Time  4    Period  Weeks    Status  Revised      PT LONG TERM GOAL #3   Title  Pt will improve DGI score to at least 13/24 for decreased fall risk and improved functional mobility.    Baseline  9/24 on 09/25/19    Time  4    Period  Weeks    Status  Revised      PT LONG TERM GOAL #4   Title  Pt will ambulate at least 500 ft, indoor and outdoor surfaces, with supervision and no LOB for return to independent community gait.    Baseline  not performed today due to time constraints    Time  4    Period  Weeks    Status  Revised      PT LONG TERM GOAL #5   Title  Pt will improve gait speed to at least 1.9 ft/sec for improved gait efficiency and decr fall risk.    Baseline  1.57 ft/sec on 09/25/19    Time  4    Period  Weeks    Status  New          Plan - 10/07/19 1806    Clinical Impression Statement  Pt with motion sensitivity with bed mobility, especially with rolling R/L (minimal 1/5). Performed habituation exercises with rolling with pt able to gradually incr speed with each rep and pt having decr duration of dizziness with decr reps. Pt continues to remain guarded with gait and needs frequent cues for reciprocal arm swing to assist with balance and trunk rotation. Needed intermittent breaks due to dizziness. Encouraged pt to continue to move head more with activity during everyday functional tasks in order to decr dizziness. Pt will slow progress due to dizziness - will continue to progress towards LTGs.    Personal Factors and Comorbidities  Comorbidity 3+    Comorbidities  PMH:  asthma, hyperlipidemia, spondyloslisthesis, R RTC tear    Examination-Activity Limitations  Locomotion Level;Transfers;Stand;Stairs    Examination-Participation Restrictions  Community Activity;Yard Work;Other   walking for exercise in neighborhood, return to work   Stability/Clinical Decision Making   Evolving/Moderate complexity    Rehab Potential  Good    PT Frequency  1x / week    PT Duration  4 weeks    PT Treatment/Interventions  ADLs/Self Care Home Management;Gait training;Stair training;Functional mobility training;Therapeutic activities;Therapeutic exercise;Balance training;Neuromuscular re-education;DME Instruction;Patient/family education;Electrical Stimulation;Orthotic Fit/Training;Vestibular    PT Next Visit Plan  NuStep for movement. standing weight shifting -anything to encourage movement, continue to work on standing balance on compliant surface - eyes open with head motions (in gentle ranges), eyes closed balance on compliant surface - with wide BOS. environmental scanning. practice turning - try 180 and 360 degree slowly.    Consulted and Agree with Plan of Care  Patient       Patient will benefit from skilled therapeutic intervention in order to improve the following deficits and impairments:  Abnormal gait, Difficulty walking, Decreased endurance, Decreased balance, Decreased activity tolerance, Decreased strength, Decreased mobility  Visit Diagnosis: Other lack of coordination  Muscle weakness (generalized)  Dizziness and giddiness     Problem List Patient Active Problem List   Diagnosis Date Noted  . CVA (cerebral vascular accident) ([place]) 07/29/2019  . Dysphagia 07/29/2019  . Left hand weakness 07/29/2019  . Palpitations 09/15/2015  . Diastolic dysfunction 63/89/3734  . Renal cyst, left  06/19/2013  . History of migraine headaches 03/05/2013  . Complicated migraine 35/46/5681  . HLD (hyperlipidemia) 03/05/2013  . 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 Rotator cuff syndrome 03/05/2013  . Low back pain 03/05/2013    Arliss Journey, PT, DPT  10/07/2019, 6:07 PM  [facility] [address] [place], [place], [address] Phone: [phone]   Fax:  [phone]  Name: Tara [name] MRN:  [id number] Date of Birth: 01/22/1966

## 2019-10-07 NOTE — Therapy (Signed)
[facility] [address] [place], [place], [address] Phone: [phone]   Fax:  [phone]  Occupational Therapy Treatment  Patient Details  Name: Tara [name] MRN: [id number] Date of Birth: 25-Sep-1965 No data recorded  Encounter Date: 10/07/2019  OT End of Session - 10/07/19 1403    Visit Number  10    Number of Visits  13    Date for OT Re-Evaluation  10/10/19    Authorization Type  [facility]    Authorization Time Period  week 5/6    OT Start Time  1407    OT Stop Time  1445    OT Time Calculation (min)  38 min    Activity Tolerance  Patient tolerated treatment well    Behavior During Therapy  [facility] for tasks assessed/performed       Past Medical History:  Diagnosis Date  . Asthma   . Hyperlipidemia   . Migraine   . Rotator cuff tear   . Spondylolisthesis    L4-5    Past Surgical History:  Procedure Laterality Date  . BUBBLE STUDY  07/30/2019   Procedure: BUBBLE STUDY;  Surgeon: [name], MD;  Location: [facility] ENDOSCOPY;  Service: Cardiovascular;;  . LOOP RECORDER INSERTION N/A 07/30/2019   Procedure: LOOP RECORDER INSERTION;  Surgeon: [name], MD;  Location: [facility] INVASIVE CV LAB;  Service: Cardiovascular;  Laterality: N/A;  . No prior surgery    . TEE WITHOUT CARDIOVERSION N/A 07/30/2019   Procedure: TRANSESOPHAGEAL ECHOCARDIOGRAM (TEE);  Surgeon: [name], MD;  Location: [facility] ENDOSCOPY;  Service: Cardiovascular;  Laterality: N/A;    There were no vitals filed for this visit.  Subjective Assessment - 10/07/19 1407    Currently in Pain?  No/denies            Treatment:Standing with right hand providing support on tabletop while reaching for graded clothespins on a lower surface to place on vertical antennae for head and eye movements and trunk rotation . Pt required several v.c and close supervison-minguard for balance. (Pt rates dizziness at a 2/10, however it appears  to be worse.) Copying small peg design on tabletop while performing head turns s to right side to locate design, min difficulty and increased time Gripper set at level 3 for sustained grip with LUE, min difficulty/ drops Arm bike x 6 mins level 3 for conditioning, min v.c for speed, pt only achieved 22 rpm with pt c/o numbness in LUE.  Therapist encouraged pt to move her head mor with activity to improve her dizziness.                 OT Short Term Goals - 10/07/19 1408      OT SHORT TERM GOAL #1   Title  Independent with HEP for LUE strengthening (shoulder and hand)    Time  3    Period  Weeks    Status  Achieved      OT SHORT TERM GOAL #2   Title  Grip strength Lt hand to be 45 lbs or greater    Baseline  35 lbs (Rt = 56)    Time  3    Period  Weeks    Status  On-going   42.5, 46- inconsistent     OT SHORT TERM GOAL #3   Title  Pt will increase typing speed to 20 wpm, 90% accuracy in prep for return to work    Time  3  Period  Weeks    Status  On-going   18 wpm     OT SHORT TERM GOAL #4   Title  Pt to perform environmental scanning w/ head turns and no LOB, min dizziness w/ 90% accuracy    Time  3    Period  Weeks    Status  On-going        OT Long Term Goals - 10/07/19 1418      OT LONG TERM GOAL #1   Title  Pt to report less drops Lt hand t/o day    Time  6    Period  Weeks    Status  On-going      OT LONG TERM GOAL #2   Title  Pt to perform cleaning tasks for 20 min w/o rest mod I level    Time  6    Period  Weeks    Status  On-going      OT LONG TERM GOAL #3   Title  Pt to perform full cooking tasks mod I level safely    Time  6    Period  Weeks    Status  On-going   not consistently     OT LONG TERM GOAL #4   Title  Pt to simulate work tasks w/ extra time as required    Time  6    Period  Weeks    Status  On-going            Plan - 10/07/19 1419    Clinical Impression Statement  Pt making slow progress towards goals. Pt  remains limited by dizziness.    Occupational performance deficits (Please refer to evaluation for details):  IADL's;Work;Leisure    Rehab Potential  Good    OT Frequency  2x / week    OT Duration  6 weeks    OT Treatment/Interventions  Self-care/ADL training;Therapeutic exercise;Functional Mobility Training;Neuromuscular education;Manual Therapy;Therapeutic activities;Coping strategies training;DME and/or AE instruction;Cognitive remediation/compensation;Visual/perceptual remediation/compensation;Moist Heat;Passive range of motion;Patient/family education    Plan  continue typing, visual scanning    Recommended Other Services  Speech eval scheduled    Consulted and Agree with Plan of Care  Patient       Patient will benefit from skilled therapeutic intervention in order to improve the following deficits and impairments:           Visit Diagnosis: Muscle weakness (generalized)  Other lack of coordination  Unsteadiness on feet  Other abnormalities of gait and mobility    Problem List Patient Active Problem List   Diagnosis Date Noted  . CVA (cerebral vascular accident) ([place]) 07/29/2019  . Dysphagia 07/29/2019  . Left hand weakness 07/29/2019  . Palpitations 09/15/2015  . Diastolic dysfunction 09/32/6712  . Renal cyst, left 06/19/2013  . History of migraine headaches 03/05/2013  . Complicated migraine 45/80/9983  . HLD (hyperlipidemia) 03/05/2013  . 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 Rotator cuff syndrome 03/05/2013  . Low back pain 03/05/2013    Tara [name] 10/07/2019, 2:27 PM  [facility] [address] [place], [place], [address] Phone: [phone]   Fax:  [phone]  Name: Tara [name] MRN: [id number] Date of Birth: 06/30/1965

## 2019-10-08 ENCOUNTER — Ambulatory Visit: Payer: 59 | Admitting: Speech Pathology

## 2019-10-08 ENCOUNTER — Ambulatory Visit: Payer: 59 | Admitting: Occupational Therapy

## 2019-10-08 ENCOUNTER — Encounter: Payer: Self-pay | Admitting: Speech Pathology

## 2019-10-08 ENCOUNTER — Ambulatory Visit: Payer: 59 | Admitting: Physical Therapy

## 2019-10-08 DIAGNOSIS — R471 Dysarthria and anarthria: Secondary | ICD-10-CM

## 2019-10-08 DIAGNOSIS — R41841 Cognitive communication deficit: Secondary | ICD-10-CM

## 2019-10-08 DIAGNOSIS — R2689 Other abnormalities of gait and mobility: Secondary | ICD-10-CM | POA: Diagnosis not present

## 2019-10-08 DIAGNOSIS — R2681 Unsteadiness on feet: Secondary | ICD-10-CM

## 2019-10-08 DIAGNOSIS — M6281 Muscle weakness (generalized): Secondary | ICD-10-CM | POA: Diagnosis not present

## 2019-10-08 DIAGNOSIS — R278 Other lack of coordination: Secondary | ICD-10-CM | POA: Diagnosis not present

## 2019-10-08 DIAGNOSIS — I69318 Other symptoms and signs involving cognitive functions following cerebral infarction: Secondary | ICD-10-CM

## 2019-10-08 DIAGNOSIS — R42 Dizziness and giddiness: Secondary | ICD-10-CM | POA: Diagnosis not present

## 2019-10-08 NOTE — Patient Instructions (Signed)
   Double check that you have actually completed task before checking off that you have done it  Practice reading with a bookmark or index card under the line you are reading to help you eyes focus on the correct line  If you try to shop again, try out a visor or ball cap and sunglasses when you try the store  Pick 2-3 chores that are priorities a day - 4-5 tasks is too much right now - Getting your husband food is 1 of those priorities  Speech and conversation are very rapid. You may be processing conversationjust slightly slower, making it harder to get all of the information. Repeat back important information that you have heard to make sure you got all of the information  Limit phone conversations to 20-30 minutes - it is a lot of brain work to process what they are saying, then process what you are going to say

## 2019-10-08 NOTE — Therapy (Signed)
[facility] [address] [place], [place], [address] Phone: [phone]   Fax:  [phone]  Speech Language Pathology Treatment  Patient Details  Name: Tara [name] MRN: [id number] Date of Birth: 1965/06/05 Referring Provider (SLP): Frann Rider NP   Encounter Date: 10/08/2019  End of Session - 10/08/19 1458    Visit Number  2    Number of Visits  17    Date for SLP Re-Evaluation  11/26/19    Authorization Type  none    SLP Start Time  1355    SLP Stop Time   1440    SLP Time Calculation (min)  45 min    Activity Tolerance  Patient tolerated treatment well   dizziness by end of session      Past Medical History:  Diagnosis Date  . Asthma   . Hyperlipidemia   . Migraine   . Rotator cuff tear   . Spondylolisthesis    L4-5    Past Surgical History:  Procedure Laterality Date  . BUBBLE STUDY  07/30/2019   Procedure: BUBBLE STUDY;  Surgeon: Donato Heinz, MD;  Location: [facility];  Service: Cardiovascular;;  . LOOP RECORDER INSERTION N/A 07/30/2019   Procedure: LOOP RECORDER INSERTION;  Surgeon: Evans Lance, MD;  Location: [place] CV LAB;  Service: Cardiovascular;  Laterality: N/A;  . No prior surgery    . TEE WITHOUT CARDIOVERSION N/A 07/30/2019   Procedure: TRANSESOPHAGEAL ECHOCARDIOGRAM (TEE);  Surgeon: Donato Heinz, MD;  Location: [facility] ENDOSCOPY;  Service: Cardiovascular;  Laterality: N/A;    There were no vitals filed for this visit.  Subjective Assessment - 10/08/19 1358    Subjective  "My husband pointed out that I didn't finish a task"    Currently in Pain?  No/denies            ADULT SLP TREATMENT - 10/08/19 1401      General Information   Behavior/Cognition  Alert;Cooperative;Pleasant mood      Treatment Provided   Treatment provided  Cognitive-Linquistic      Cognitive-Linquistic Treatment   Treatment focused on   Cognition;Dysarthria;Patient/family/caregiver education    Skilled Treatment  Pt reports using a to do list, however chores are started but left undone. Tara [name] is to check off each task only after she double checks that it is comlplete. Tara [name] reported 6 chores that she did yesterday, however she fell asleep early in the evening until 2:00am and did not get dinner for her or her spouse. We discussed setting priorities of 3 tasks with rests in between. Making food for her spouse is a priority. Educated pt re: strategy of repeating back what she heard on phone conversations with FMLA, MD, disability phone calls. She agrees she doesn't process everything she is being told and is easliy distracted. Attended to reading with larger print and use of paper to stay on the correct line with rare min A. Tara [name] required min questioning cues to verbalize her volume is too soft. When I asked she stated her volume was ok, but with further questioning, she realized the front desk had a hard time hearing her a check in. Will add HEP for dysarthria next few visits as tolerated.      Assessment / Recommendations / Plan   Plan  Continue with current plan of care      Progression Toward Goals   Progression toward goals  Progressing toward goals       SLP Education -  10/08/19 1454    Education Details  energy conservation, prioritize tasks, compensations for slow processing and attention in phone calls    Person(s) Educated  Patient    Methods  Explanation;Demonstration;Verbal cues;Handout    Comprehension  Verbal cues required;Need further instruction       SLP Short Term Goals - 10/08/19 1458      SLP SHORT TERM GOAL #1   Title  Pt will utiltize external aids to schedule and complete daily tasks with rare min A over 3 sessions    Time  4    Period  Weeks    Status  On-going      SLP SHORT TERM GOAL #2   Title  Pt will uitlize compensatory strategies to attend to and process verbal instructions and  converstion accurately with rare min A over 2 sessions    Time  4    Period  Weeks    Status  On-going      SLP SHORT TERM GOAL #3   Title  Pt will read 1 paragraph (5-7 sentences) and recall details with rare min A over 2 sessions    Time  4    Period  Weeks    Status  On-going       SLP Long Term Goals - 10/08/19 1458      SLP LONG TERM GOAL #1   Title  Pt will utilize compensations for attention to prepare a main dish for 3 meals with rare min A    Time  8    Period  Weeks    Status  On-going      SLP LONG TERM GOAL #2   Title  Pt will carryover compensatory strategies for attention and processing to run 3 errands in a row successfully with rare min A    Time  8    Period  Weeks    Status  On-going      SLP LONG TERM GOAL #3   Title  Pt will return to ADL/IADL task after a distraction and succesfully complete task before starting a new activity with rare min A over 3 sessions    Time  8    Period  Weeks    Status  On-going       Plan - 10/08/19 1455    Clinical Impression Statement  Tara [name] continues to present with 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 Mild dysarthria characterized by sub WNL volume affecting intelligibility. Ongoing training of compensations for cogntive impairments for successful completion of IADL's, intelligiblilty. Continue skilled ST    Speech Therapy Frequency  2x / week    Duration  --   8 weeks or 17 visits   Treatment/Interventions  Language facilitation;Environmental controls;Cueing hierarchy;SLP instruction and feedback;Compensatory strategies;Functional tasks;Cognitive reorganization;Patient/family education;Multimodal communcation approach;Internal/external aids;Compensatory techniques    Potential to Achieve Goals  Good       Patient will benefit from skilled therapeutic intervention in order to improve the following deficits and impairments:   Cognitive communication  deficit  Dysarthria and anarthria    Problem List Patient Active Problem List   Diagnosis Date Noted  . CVA (cerebral vascular accident) ([facility]) 07/29/2019  . Dysphagia 07/29/2019  . Left hand weakness 07/29/2019  . Palpitations 09/15/2015  . Diastolic dysfunction 09/15/2015  . Renal cyst, left 06/19/2013  . History of migraine headaches 03/05/2013  . 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 HLD (hyperlipidemia) 03/05/2013  . 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 Rotator cuff  syndrome 03/05/2013  . Low back pain 03/05/2013    Tara [name], Radene Journey MS, CCC-SLP 10/08/2019, 3:00 PM  [facility] [address] [place], [place], [address] Phone: [phone]   Fax:  [phone]   Name: Tara [name] MRN: [id number] Date of Birth: 1965-07-16

## 2019-10-08 NOTE — Therapy (Signed)
[facility] [address] [place], [place], [address] Phone: [phone]   Fax:  [phone]  Occupational Therapy Treatment  Patient Details  Name: Tara [name] MRN: [id number] Date of Birth: 1965-10-25 No data recorded  Encounter Date: 10/08/2019  OT End of Session - 10/08/19 1157    Visit Number  11    Number of Visits  13    Date for OT Re-Evaluation  10/10/19    Authorization Type  [facility]    Authorization Time Period  week 5/6    OT Start Time  1151    OT Stop Time  1229    OT Time Calculation (min)  38 min    Activity Tolerance  Patient tolerated treatment well    Behavior During Therapy  [facility] for tasks assessed/performed       Past Medical History:  Diagnosis Date  . Asthma   . Hyperlipidemia   . Migraine   . Rotator cuff tear   . Spondylolisthesis    L4-5    Past Surgical History:  Procedure Laterality Date  . BUBBLE STUDY  07/30/2019   Procedure: BUBBLE STUDY;  Surgeon: Donato Heinz, MD;  Location: [facility];  Service: Cardiovascular;;  . LOOP RECORDER INSERTION N/A 07/30/2019   Procedure: LOOP RECORDER INSERTION;  Surgeon: Evans Lance, MD;  Location: [place] CV LAB;  Service: Cardiovascular;  Laterality: N/A;  . No prior surgery    . TEE WITHOUT CARDIOVERSION N/A 07/30/2019   Procedure: TRANSESOPHAGEAL ECHOCARDIOGRAM (TEE);  Surgeon: Donato Heinz, MD;  Location: [facility] ENDOSCOPY;  Service: Cardiovascular;  Laterality: N/A;    There were no vitals filed for this visit.  Subjective Assessment - 10/08/19 1157    Currently in Pain?  No/denies    Pain Onset  1 to 4 weeks ago        Treatment: Typing activity "clouds" for increased speed and accuracy, min difficulty Scanning activity in standing to put together mod complex puzzle, min v.c to reduce UE support and to perform head turns when scanning, pt had to sit down 3/4 of the way though task due to dizziness. Pt   Reports dizziness 1-2 out of 10 however she is unable to continue with another task until she takes a rest break. Pt reports that the vibrant colors of the puzzle impact her dizziness. Grooved pegboard for increased fine motor coordination with LUE, removing with in hand manipulation, pt demonstrates improving fine motor coordination. Pt still c/o numbness in several of her digits.                     OT Short Term Goals - 10/07/19 1408      OT SHORT TERM GOAL #1   Title  Independent with HEP for LUE strengthening (shoulder and hand)    Time  3    Period  Weeks    Status  Achieved      OT SHORT TERM GOAL #2   Title  Grip strength Lt hand to be 45 lbs or greater    Baseline  35 lbs (Rt = 56)    Time  3    Period  Weeks    Status  On-going   42.5, 46- inconsistent     OT SHORT TERM GOAL #3   Title  Pt will increase typing speed to 20 wpm, 90% accuracy in prep for return to work    Time  3    Period  Weeks    Status  On-going   18 wpm     OT SHORT TERM GOAL #4   Title  Pt to perform environmental scanning w/ head turns and no LOB, min dizziness w/ 90% accuracy    Time  3    Period  Weeks    Status  On-going        OT Long Term Goals - 10/07/19 1418      OT LONG TERM GOAL #1   Title  Pt to report less drops Lt hand t/o day    Time  6    Period  Weeks    Status  On-going      OT LONG TERM GOAL #2   Title  Pt to perform cleaning tasks for 20 min w/o rest mod I level    Time  6    Period  Weeks    Status  On-going      OT LONG TERM GOAL #3   Title  Pt to perform full cooking tasks mod I level safely    Time  6    Period  Weeks    Status  On-going   not consistently     OT LONG TERM GOAL #4   Title  Pt to simulate work tasks w/ extra time as required    Time  6    Period  Weeks    Status  On-going              Patient will benefit from skilled therapeutic intervention in order to improve the following deficits and impairments:            Visit Diagnosis: Muscle weakness (generalized)  Other lack of coordination  Unsteadiness on feet    Problem List Patient Active Problem List   Diagnosis Date Noted  . CVA (cerebral vascular accident) ([facility]) 07/29/2019  . Dysphagia 07/29/2019  . Left hand weakness 07/29/2019  . Palpitations 09/15/2015  . Diastolic dysfunction 09/15/2015  . Renal cyst, left 06/19/2013  . History of migraine headaches 03/05/2013  . 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 HLD (hyperlipidemia) 03/05/2013  . 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 Rotator cuff syndrome 03/05/2013  . Low back pain 03/05/2013    Tara [name] 10/08/2019, 12:05 PM  [facility] [address] [place], [place], [address] Phone: [phone]   Fax:  [phone]  Name: Tara [name] MRN: [id number] Date of Birth: [birth year]

## 2019-10-09 ENCOUNTER — Other Ambulatory Visit: Payer: Self-pay

## 2019-10-09 ENCOUNTER — Ambulatory Visit: Payer: 59

## 2019-10-09 DIAGNOSIS — R471 Dysarthria and anarthria: Secondary | ICD-10-CM | POA: Diagnosis not present

## 2019-10-09 DIAGNOSIS — R41841 Cognitive communication deficit: Secondary | ICD-10-CM

## 2019-10-09 DIAGNOSIS — R2681 Unsteadiness on feet: Secondary | ICD-10-CM | POA: Diagnosis not present

## 2019-10-09 DIAGNOSIS — R42 Dizziness and giddiness: Secondary | ICD-10-CM | POA: Diagnosis not present

## 2019-10-09 DIAGNOSIS — R278 Other lack of coordination: Secondary | ICD-10-CM | POA: Diagnosis not present

## 2019-10-09 DIAGNOSIS — M6281 Muscle weakness (generalized): Secondary | ICD-10-CM | POA: Diagnosis not present

## 2019-10-09 DIAGNOSIS — I69318 Other symptoms and signs involving cognitive functions following cerebral infarction: Secondary | ICD-10-CM | POA: Diagnosis not present

## 2019-10-09 DIAGNOSIS — R2689 Other abnormalities of gait and mobility: Secondary | ICD-10-CM | POA: Diagnosis not present

## 2019-10-09 NOTE — Therapy (Signed)
[facility] [address] [place], [place], [address] Phone: [phone]   Fax:  [phone]  Speech Language Pathology Treatment  Patient Details  Name: Tara [name] MRN: [id number] Date of Birth: Jun 23, 1965 Referring Provider (SLP): Frann Rider NP   Encounter Date: 10/09/2019  End of Session - 10/09/19 1404    Visit Number  3    Number of Visits  17    Date for SLP Re-Evaluation  11/26/19    Authorization Type  none    SLP Start Time  1320    SLP Stop Time   1402    SLP Time Calculation (min)  42 min    Activity Tolerance  Patient tolerated treatment well   dizziness by end of session      Past Medical History:  Diagnosis Date  . Asthma   . Hyperlipidemia   . Migraine   . Rotator cuff tear   . Spondylolisthesis    L4-5    Past Surgical History:  Procedure Laterality Date  . BUBBLE STUDY  07/30/2019   Procedure: BUBBLE STUDY;  Surgeon: Donato Heinz, MD;  Location: [facility];  Service: Cardiovascular;;  . LOOP RECORDER INSERTION N/A 07/30/2019   Procedure: LOOP RECORDER INSERTION;  Surgeon: Evans Lance, MD;  Location: [place] CV LAB;  Service: Cardiovascular;  Laterality: N/A;  . No prior surgery    . TEE WITHOUT CARDIOVERSION N/A 07/30/2019   Procedure: TRANSESOPHAGEAL ECHOCARDIOGRAM (TEE);  Surgeon: Donato Heinz, MD;  Location: [facility] ENDOSCOPY;  Service: Cardiovascular;  Laterality: N/A;    There were no vitals filed for this visit.  Subjective Assessment - 10/09/19 1328    Subjective  "Mickel Baas told me I would see you."    Currently in Pain?  No/denies            ADULT SLP TREATMENT - 10/09/19 1329      General Information   Behavior/Cognition  Alert;Cooperative;Pleasant mood   anxious(?), labile(?)     Treatment Provided   Treatment provided  Cognitive-Linquistic      Cognitive-Linquistic Treatment   Treatment focused on   Cognition;Dysarthria;Patient/family/caregiver education    Skilled Treatment  Pt reported that Kristopher Oppenheim was extremely difficult to navigate due to incr'd visul stimulation. She provided what appeared to be an adequte summry of wht she and SLP discussed yeterday. . Pt did not bring her list of chores for today but recalled she has already done 2 things. Pt completed business call for today - callled [employer] company about windshield on husband's car. Pt able to tell SLP strategy of repeating back to confirm comprehension and writing details down for incr'd recall. SLP congratulated pt on picking a manageble amount of chores for today. Pt talking about concern about going back to work - SLP suggested pt to have that as a goal but not to dwell on it as it is psychologically unproductive. SLP encouraged pt to look behind and see progress towards her goal. Told pt if she needs assistance with managing these feelings of being unproductive and "stupid" professional options (i.e., psychologist) are available if she has interest in that. Pt told SLP she is planning tonight to schedule her 3 things for tomorrow.       Assessment / Recommendations / Plan   Plan  Continue with current plan of care      Progression Toward Goals   Progression toward goals  Progressing toward goals  SLP Short Term Goals - 10/09/19 1404      SLP SHORT TERM GOAL #1   Title  Pt will utiltize external aids to schedule and complete daily tasks with rare min A over 3 sessions    Time  4    Period  Weeks    Status  On-going      SLP SHORT TERM GOAL #2   Title  Pt will uitlize compensatory strategies to attend to and process verbal instructions and converstion accurately with rare min A over 2 sessions    Time  4    Period  Weeks    Status  On-going      SLP SHORT TERM GOAL #3   Title  Pt will read 1 paragraph (5-7 sentences) and recall details with rare min A over 2 sessions    Time  4    Period  Weeks     Status  On-going       SLP Long Term Goals - 10/09/19 1405      SLP LONG TERM GOAL #1   Title  Pt will utilize compensations for attention to prepare a main dish for 3 meals with rare min A    Time  8    Period  Weeks    Status  On-going      SLP LONG TERM GOAL #2   Title  Pt will carryover compensatory strategies for attention and processing to run 3 errands in a row successfully with rare min A    Time  8    Period  Weeks    Status  On-going      SLP LONG TERM GOAL #3   Title  Pt will return to ADL/IADL task after a distraction and succesfully complete task before starting a new activity with rare min A over 3 sessions    Time  8    Period  Weeks    Status  On-going       Plan - 10/09/19 1404    Clinical Impression Statement  Kylan continues to present with 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 Mild dysarthria characterized by sub WNL volume affecting intelligibility. Ongoing training of compensations for cogntive impairments for successful completion of IADL's, intelligiblilty. Continue skilled ST    Speech Therapy Frequency  2x / week    Duration  --   8 weeks or 17 visits   Treatment/Interventions  Language facilitation;Environmental controls;Cueing hierarchy;SLP instruction and feedback;Compensatory strategies;Functional tasks;Cognitive reorganization;Patient/family education;Multimodal communcation approach;Internal/external aids;Compensatory techniques    Potential to Achieve Goals  Good       Patient will benefit from skilled therapeutic intervention in order to improve the following deficits and impairments:   Cognitive communication deficit  Dysarthria and anarthria    Problem List Patient Active Problem List   Diagnosis Date Noted  . CVA (cerebral vascular accident) ([facility]) 07/29/2019  . Dysphagia 07/29/2019  . Left hand weakness 07/29/2019  . Palpitations 09/15/2015  . Diastolic dysfunction  09/15/2015  . Renal cyst, left 06/19/2013  . History of migraine headaches 03/05/2013  . 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 HLD (hyperlipidemia) 03/05/2013  . 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 Rotator cuff syndrome 03/05/2013  . Low back pain 03/05/2013    [facility] ,MS, CCC-SLP  10/09/2019, 2:06 PM  [facility] [address] [place], [place], [address] Phone: [phone]   Fax:  [phone]   Name: SAMMYE STAFF MRN: [id number] Date of Birth:  03/05/1966 

## 2019-10-13 ENCOUNTER — Ambulatory Visit: Payer: 59 | Admitting: Speech Pathology

## 2019-10-14 ENCOUNTER — Ambulatory Visit: Payer: 59 | Admitting: Speech Pathology

## 2019-10-14 ENCOUNTER — Ambulatory Visit: Payer: 59 | Admitting: Physical Therapy

## 2019-10-14 ENCOUNTER — Other Ambulatory Visit: Payer: Self-pay

## 2019-10-14 DIAGNOSIS — I69318 Other symptoms and signs involving cognitive functions following cerebral infarction: Secondary | ICD-10-CM | POA: Diagnosis not present

## 2019-10-14 DIAGNOSIS — R2681 Unsteadiness on feet: Secondary | ICD-10-CM | POA: Diagnosis not present

## 2019-10-14 DIAGNOSIS — R41841 Cognitive communication deficit: Secondary | ICD-10-CM | POA: Diagnosis not present

## 2019-10-14 DIAGNOSIS — R471 Dysarthria and anarthria: Secondary | ICD-10-CM | POA: Diagnosis not present

## 2019-10-14 DIAGNOSIS — R42 Dizziness and giddiness: Secondary | ICD-10-CM | POA: Diagnosis not present

## 2019-10-14 DIAGNOSIS — R278 Other lack of coordination: Secondary | ICD-10-CM | POA: Diagnosis not present

## 2019-10-14 DIAGNOSIS — R2689 Other abnormalities of gait and mobility: Secondary | ICD-10-CM | POA: Diagnosis not present

## 2019-10-14 DIAGNOSIS — M6281 Muscle weakness (generalized): Secondary | ICD-10-CM | POA: Diagnosis not present

## 2019-10-14 NOTE — Patient Instructions (Addendum)
Start writing down the things you want to accomplish (chores, prepping for dinner, etc). 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 Use a similar process for home management.   By trying to hold all the things you want to do in your head, you are taking up more of your brain's space and energy. Writing things down will free up some space and allow you to focus on what you are doing.   Continue using your calendar when you are planning out your day. Make sure to manage your energy, build in time for rest, and schedule more challenging and demanding tasks for when you are fresh.   Bring your list with you when you come next time.  When you are working on your CBL, use a timer and schedule times to take a break. Try to take a break before you get overwhelmed. Keep a pen and notebook out and take some notes as you go along. Pause, rewind, relisten if you feel you are getting distracted or miss something.

## 2019-10-14 NOTE — Therapy (Signed)
[facility] [address] [place], [place], [address] Phone: [phone]   Fax:  [phone]  Physical Therapy Treatment  Patient Details  Name: Tara [name] MRN: [id number] Date of Birth: Oct 08, 1965 No data recorded  Encounter Date: 10/14/2019  PT End of Session - 10/14/19 1510    Visit Number  16    Number of Visits  17    Authorization Type  [facility]    PT Start Time  6010    PT Stop Time  1230    PT Time Calculation (min)  41 min    Equipment Utilized During Treatment  Gait belt    Activity Tolerance  Patient tolerated treatment well   limited by dizziness; at worst, 2/10   Behavior During Therapy  [facility] for tasks assessed/performed       Past Medical History:  Diagnosis Date  . Asthma   . Hyperlipidemia   . Migraine   . Rotator cuff tear   . Spondylolisthesis    L4-5    Past Surgical History:  Procedure Laterality Date  . BUBBLE STUDY  07/30/2019   Procedure: BUBBLE STUDY;  Surgeon: Donato Heinz, MD;  Location: [facility];  Service: Cardiovascular;;  . LOOP RECORDER INSERTION N/A 07/30/2019   Procedure: LOOP RECORDER INSERTION;  Surgeon: Evans Lance, MD;  Location: [facility];  Service: Cardiovascular;  Laterality: N/A;  . No prior surgery    . TEE WITHOUT CARDIOVERSION N/A 07/30/2019   Procedure: TRANSESOPHAGEAL ECHOCARDIOGRAM (TEE);  Surgeon: Donato Heinz, MD;  Location: [facility];  Service: Cardiovascular;  Laterality: N/A;    There were no vitals filed for this visit.  Subjective Assessment - 10/14/19 1150    Subjective  Feel some better; trying to move a little more.    Patient Stated Goals  Pt's goals for therapy are to strengthen to help with the tired feeling.    Currently in Pain?  No/denies    Pain Onset  1 to 4 weeks ago                        [facility] Adult PT Treatment/Exercise - 10/14/19 1150      Transfers   Transfers  Sit to  Stand;Stand to Sit    Sit to Stand  5: Supervision;Without upper extremity assist;From chair/3-in-1    Stand to Sit  5: Supervision;Without upper extremity assist;To chair/3-in-1    Transfer Cueing  Pt performs 5x sit>stand from chair, 52.84 seconds, 48.06 seconds    Comments  Prior to sit<>stand repetitions, pt performs forward lean>upright posture x 5 reps, to try to assist with improved forward lean to initiate transfers more efficiently.      Ambulation/Gait   Ambulation/Gait  Yes    Ambulation/Gait Assistance  5: Supervision    Ambulation/Gait Assistance Details  Pt slows pace of gait during curves/turns in gym area    Ambulation Distance (Feet)  230 Feet    Assistive device  None    Gait Pattern  Step-through pattern;Decreased arm swing - right;Decreased arm swing - left;Decreased step length - left;Decreased step length - right;Decreased stride length;Decreased trunk rotation;Narrow base of support    Ambulation Surface  Level;Indoor          Balance Exercises - 10/14/19 1206      Balance Exercises: Standing   Tandem Gait  Forward;4 reps   slow turns to change directions along counter   Retro Gait  Upper extremity support;3 reps   Fwd/back along counter   Other Standing Exercises  Sidestep-together, R and L, x 10 reps each side (with increased dizziness to 2).  Back-step together, then forward step-togehter, x 5 reps, with UE support at counter.    Other Standing Exercises Comments  Reviewed and practiced quarter turns, each side, x 2 reps with head turn leading quarter turn.  Pt with significantly slowed pace with this activity.      Self Care: Discussed pt's outings this past week to grocery store (x 20 minutes, 6 items), to [employer] (had to leave after 5 minutes due to nausea), and to restaurant, all of which pt reported as overstimulating.  Discussed trying to do outings in smaller time frames and less busy times, for grocery store, less time and less items, gradually  adding time and items.  Discussed using strategies when dizziness increases, such as deep breaths, relaxing posture/shoulder circles, and widening her BOS for weightshifting to get through and let the unsteadiness/dizziness settle.  Discussed this as more real-life habituation and applauded pt for trying, but did recommend that she do these items as discussed to avoid complete overstimulation and shutting down that activity.  Continued to reiterate relaxed posture and relaxed arm swing with gait and standing activities to help with improved overall ease of movement.    PT Short Term Goals - 09/10/19 0944      PT SHORT TERM GOAL #1   Title  Pt will be independent with HEP for improved strength, balance, and gait.  TARGET for all STGs:  4 weeks:  09/05/2019    Time  4    Period  Weeks    Status  Achieved      PT SHORT TERM GOAL #2   Title  Pt will improve 5x sit<>stand to less than or equal to 15 seconds for improved functional lower extremity strength.    Baseline  21.68 seconds on 09/03/19    Time  4    Period  Weeks    Status  Not Met      PT SHORT TERM GOAL #3   Title  Pt will improve DGI score to at least 16/24 for decresaed fall risk.    Baseline  10/24 on 09/10/19    Time  4    Period  Weeks    Status  Not Met      PT SHORT TERM GOAL #4   Title  Pt will verbalize understanding of:  fall prevention in home environment and CVA warning signs/symptoms    Time  4    Period  Weeks    Status  Achieved        PT Long Term Goals - 09/25/19 1210      PT LONG TERM GOAL #1   Title  Pt will be independent with progression of HEP to address strength, balance, gait, and vestibular impairments.  TARGET 10/23/2019 (may be delayed due to scheduling)    Baseline  pt will continue to benefit from revisions/additions to HEP    Time  4    Period  Weeks    Status  On-going    Target Date  10/23/19      PT LONG TERM GOAL #2   Title  Pt will improve 5x sit<>stand to less than or equal to 22  seconds for improved functional strength.    Baseline  38.9 seconds with no UE support from standard chair    Time  4  Period  Weeks    Status  Revised      PT LONG TERM GOAL #3   Title  Pt will improve DGI score to at least 13/24 for decreased fall risk and improved functional mobility.    Baseline  9/24 on 09/25/19    Time  4    Period  Weeks    Status  Revised      PT LONG TERM GOAL #4   Title  Pt will ambulate at least 500 ft, indoor and outdoor surfaces, with supervision and no LOB for return to independent community gait.    Baseline  not performed today due to time constraints    Time  4    Period  Weeks    Status  Revised      PT LONG TERM GOAL #5   Title  Pt will improve gait speed to at least 1.9 ft/sec for improved gait efficiency and decr fall risk.    Baseline  1.57 ft/sec on 09/25/19    Time  4    Period  Weeks    Status  New            Plan - 10/14/19 1511    Clinical Impression Statement  Pt reporting that she has been out to restaurant and grocery store and [facility] in the past week (with noted "overstimulation"), with pt reproting nausea and needing to leave [facility] after about 5 minutes.  Applauded pt's efforts for community activity, but discussed performing in smaller time frames and less stimulating environments at first.  Pt continues to remain gaurded iwth walkinga nd change of direction activities.  Pt's dizziness comes on to about 2 with sidestepping and forward/back stepping at counter.  She will continue to benefit from skilled PT to help wtih habituation and progression of dynamic standing and balance and gait activities.    Personal Factors and Comorbidities  Comorbidity 3+    Comorbidities  PMH:  asthma, hyperlipidemia, spondyloslisthesis, R RTC tear    Examination-Activity Limitations  Locomotion Level;Transfers;Stand;Stairs    Examination-Participation Restrictions  Community Activity;Yard Work;Other   walking for exercise in neighborhood,  return to work   Stability/Clinical Decision Making  Evolving/Moderate complexity    Rehab Potential  Good    PT Frequency  1x / week    PT Duration  4 weeks    PT Treatment/Interventions  ADLs/Self Care Home Management;Gait training;Stair training;Functional mobility training;Therapeutic activities;Therapeutic exercise;Balance training;Neuromuscular re-education;DME Instruction;Patient/family education;Electrical Stimulation;Orthotic Fit/Training;Vestibular    PT Next Visit Plan  Need to check goals next visit and likely renew?-NuStep for movement. standing weight shifting -anything to encourage movement, continue to work on standing balance on compliant surface - eyes open with head motions (in gentle ranges), eyes closed balance on compliant surface - with wide BOS. environmental scanning. practice turning - try 180 and 360 degree slowly.    Consulted and Agree with Plan of Care  Patient       Patient will benefit from skilled therapeutic intervention in order to improve the following deficits and impairments:  Abnormal gait, Difficulty walking, Decreased endurance, Decreased balance, Decreased activity tolerance, Decreased strength, Decreased mobility  Visit Diagnosis: Unsteadiness on feet  Other symptoms and signs involving cognitive functions following cerebral infarction  Other abnormalities of gait and mobility     Problem List Patient Active Problem List   Diagnosis Date Noted  . CVA (cerebral vascular accident) ([place]) 07/29/2019  . Dysphagia 07/29/2019  . Left hand weakness 07/29/2019  . Palpitations 09/15/2015  .  Diastolic dysfunction 56/21/3086  . Renal cyst, left 06/19/2013  . History of migraine headaches 03/05/2013  . Complicated migraine 57/84/6962  . HLD (hyperlipidemia) 03/05/2013  . 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 Rotator cuff syndrome 03/05/2013  . Low back pain 03/05/2013    Garv Kuechle W. 10/14/2019, 3:14 PM  Frazier Butt., PT  [facility] [address] [place], [place], [address] Phone: [phone]   Fax:  [phone]  Name: Tara [name] MRN: [id number] Date of Birth: 10-15-1965

## 2019-10-14 NOTE — Therapy (Signed)
[facility] [address] [place], [place], [address] Phone: [phone]   Fax:  [phone]  Speech Language Pathology Treatment  Patient Details  Name: Tara [name] MRN: [id number] Date of Birth: [birth year] Referring Provider (SLP): Frann Rider NP   Encounter Date: 10/14/2019  End of Session - 10/14/19 1307    Visit Number  4    Number of Visits  17    Date for SLP Re-Evaluation  11/26/19    SLP Start Time  0737    SLP Stop Time   1145    SLP Time Calculation (min)  43 min    Activity Tolerance  Patient tolerated treatment well       Past Medical History:  Diagnosis Date  . Asthma   . Hyperlipidemia   . Migraine   . Rotator cuff tear   . Spondylolisthesis    L4-5    Past Surgical History:  Procedure Laterality Date  . BUBBLE STUDY  07/30/2019   Procedure: BUBBLE STUDY;  Surgeon: Donato Heinz, MD;  Location: [facility];  Service: Cardiovascular;;  . LOOP RECORDER INSERTION N/A 07/30/2019   Procedure: LOOP RECORDER INSERTION;  Surgeon: Evans Lance, MD;  Location: [facility];  Service: Cardiovascular;  Laterality: N/A;  . No prior surgery    . TEE WITHOUT CARDIOVERSION N/A 07/30/2019   Procedure: TRANSESOPHAGEAL ECHOCARDIOGRAM (TEE);  Surgeon: Donato Heinz, MD;  Location: [facility] ENDOSCOPY;  Service: Cardiovascular;  Laterality: N/A;    There were no vitals filed for this visit.  Subjective Assessment - 10/14/19 1104    Currently in Pain?  No/denies            ADULT SLP TREATMENT - 10/14/19 1306      General Information   Behavior/Cognition  Alert;Cooperative;Pleasant mood      Treatment Provided   Treatment provided  Cognitive-Linquistic      Pain Assessment   Pain Assessment  No/denies pain      Cognitive-Linquistic Treatment   Treatment focused on  Cognition;Patient/family/caregiver education    Skilled Treatment  Patient continues to report getting  distracted and not completing tasks when she starts a new activity. SLP inquired about how patient is managing her daily tasks; she is not writing anything down. 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 SLP educated pt that by writing down her home management tasks, she can reduce some of her cognitive load during the day. 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 See pt instructions for details.      Assessment / Recommendations / Plan   Plan  Continue with current plan of care      Progression Toward Goals   Progression toward goals  Progressing toward goals       SLP Education - 10/14/19 1307    Education Details  compensations for attention, information processing    Person(s) Educated  Patient    Methods  Explanation;Handout    Comprehension  Verbalized understanding       SLP Short Term Goals - 10/14/19 1138      SLP SHORT TERM GOAL #1   Title  Pt will utiltize external aids to schedule and complete daily tasks  with rare min A over 3 sessions    Time  3    Period  Weeks    Status  On-going      SLP SHORT TERM GOAL #2   Title  Pt will uitlize compensatory strategies to attend to and process verbal instructions and converstion accurately with rare min A over 2 sessions    Time  3    Period  Weeks    Status  On-going      SLP SHORT TERM GOAL #3   Title  Pt will read 1 paragraph (5-7 sentences) and recall details with rare min A over 2 sessions    Time  3    Period  Weeks    Status  On-going       SLP Long Term Goals - 10/14/19 1141      SLP LONG TERM GOAL #1   Title  Pt will utilize compensations for attention to prepare a main dish for 3 meals with rare min A    Time  7    Period  Weeks     Status  On-going      SLP LONG TERM GOAL #2   Title  Pt will carryover compensatory strategies for attention and processing to run 3 errands in a row successfully with rare min A    Time  7    Period  Weeks    Status  On-going      SLP LONG TERM GOAL #3   Title  Pt will return to ADL/IADL task after a distraction and succesfully complete task before starting a new activity with rare min A over 3 sessions    Time  7    Period  Weeks    Status  On-going       Plan - 10/14/19 1308    Clinical Impression Statement  Tara [name] continues to present with 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 Mild dysarthria characterized by sub WNL volume affecting intelligibility. Ongoing training of compensations for cogntive impairments for successful completion of IADL's, intelligiblilty. Continue skilled ST    Speech Therapy Frequency  2x / week    Duration  --   8 weeks or 17 visits   Treatment/Interventions  Language facilitation;Environmental controls;Cueing hierarchy;SLP instruction and feedback;Compensatory strategies;Functional tasks;Cognitive reorganization;Patient/family education;Multimodal communcation approach;Internal/external aids;Compensatory techniques    Potential to Achieve Goals  Good       Patient will benefit from skilled therapeutic intervention in order to improve the following deficits and impairments:   Cognitive communication deficit    Problem List Patient Active Problem List   Diagnosis Date Noted  . CVA (cerebral vascular accident) ([facility]) 07/29/2019  . Dysphagia 07/29/2019  . Left hand weakness 07/29/2019  . Palpitations 09/15/2015  . Diastolic dysfunction 09/15/2015  . Renal cyst, left 06/19/2013  . History of migraine headaches 03/05/2013  . 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 HLD (hyperlipidemia) 03/05/2013  . 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 Rotator cuff syndrome 03/05/2013  . Low back pain  03/05/2013   Rondel Baton, MS, CCC-SLP Speech-Language Pathologist  Tara [name] 10/14/2019, 1:08 PM  [facility] [address] [place], [place], [address] Phone: [phone]   Fax:  [phone]   Name: Tara [name] MRN: [id number] Date of Birth: 1966-05-04

## 2019-10-16 ENCOUNTER — Ambulatory Visit: Payer: 59 | Admitting: Occupational Therapy

## 2019-10-16 ENCOUNTER — Other Ambulatory Visit: Payer: Self-pay

## 2019-10-16 DIAGNOSIS — M6281 Muscle weakness (generalized): Secondary | ICD-10-CM | POA: Diagnosis not present

## 2019-10-16 DIAGNOSIS — R2681 Unsteadiness on feet: Secondary | ICD-10-CM | POA: Diagnosis not present

## 2019-10-16 DIAGNOSIS — R42 Dizziness and giddiness: Secondary | ICD-10-CM | POA: Diagnosis not present

## 2019-10-16 DIAGNOSIS — R278 Other lack of coordination: Secondary | ICD-10-CM | POA: Diagnosis not present

## 2019-10-16 DIAGNOSIS — R2689 Other abnormalities of gait and mobility: Secondary | ICD-10-CM | POA: Diagnosis not present

## 2019-10-16 DIAGNOSIS — I69318 Other symptoms and signs involving cognitive functions following cerebral infarction: Secondary | ICD-10-CM

## 2019-10-16 DIAGNOSIS — R41841 Cognitive communication deficit: Secondary | ICD-10-CM | POA: Diagnosis not present

## 2019-10-16 DIAGNOSIS — R471 Dysarthria and anarthria: Secondary | ICD-10-CM | POA: Diagnosis not present

## 2019-10-16 NOTE — Therapy (Signed)
[facility] [address] [place], [place], [address] Phone: [phone]   Fax:  [phone]  Occupational Therapy Treatment  Patient Details  Name: Tara [name] MRN: [id number] Date of Birth: 27-Feb-1966 No data recorded  Encounter Date: 10/16/2019  OT End of Session - 10/16/19 1717    Visit Number  12    Number of Visits  28    Date for OT Re-Evaluation  12/14/19    Authorization Type  [facility]    Authorization Time Period  week 6/6    OT Start Time  1620    OT Stop Time  1710    OT Time Calculation (min)  50 min    Activity Tolerance  Patient tolerated treatment well    Behavior During Therapy  [facility] for tasks assessed/performed       Past Medical History:  Diagnosis Date  . Asthma   . Hyperlipidemia   . Migraine   . Rotator cuff tear   . Spondylolisthesis    L4-5    Past Surgical History:  Procedure Laterality Date  . BUBBLE STUDY  07/30/2019   Procedure: BUBBLE STUDY;  Surgeon: [name], MD;  Location: [facility]OSCOPY;  Service: Cardiovascular;;  . LOOP RECORDER INSERTION N/A 07/30/2019   Procedure: LOOP RECORDER INSERTION;  Surgeon: [name], MD;  Location: [facility];  Service: Cardiovascular;  Laterality: N/A;  . No prior surgery    . TEE WITHOUT CARDIOVERSION N/A 07/30/2019   Procedure: TRANSESOPHAGEAL ECHOCARDIOGRAM (TEE);  Surgeon: [name], MD;  Location: [facility] ENDOSCOPY;  Service: Cardiovascular;  Laterality: N/A;    There were no vitals filed for this visit.  Subjective Assessment - 10/16/19 1624    Subjective   Pt ranks dizziness 1/10    Pertinent History  CVA 07/29/19. PMH: HLD, migraines, asthma, small chronic cerebellar infarcts    Limitations  fall risk, loop recorder, ? swallowing precautions (chin tuck)    Patient Stated Goals  typing, cooking, cleaning, driving, working    Currently in Pain?  No/denies       Reviewed goals and progress to date  for renewal.  Discussed ways to modify and grade tasks for functional improvement and to increase head turns for scanning during functional tasks.  Pt practiced getting items in/out of cabinet to shelf and cues to lessen UE support. Pt then practiced getting things out of lower cabinet w/ increased dizziness - pt continues to rank 2/10 but pt has to stop activity and occasionally reports mild nausea.  Practiced tossing scarf for vertical eye movements. Pt walking while looking for numbers (therapist walking behind w/ numbered cards) placed in Rt and Lt fields to encourage head turns, however pt still compensating by turning body                      OT Short Term Goals - 10/07/19 1408      OT SHORT TERM GOAL #1   Title  Independent with HEP for LUE strengthening (shoulder and hand)    Time  3    Period  Weeks    Status  Achieved      OT SHORT TERM GOAL #2   Title  Grip strength Lt hand to be 45 lbs or greater    Baseline  35 lbs (Rt = 56)    Time  3    Period  Weeks    Status  On-going   42.5, 46-  inconsistent     OT SHORT TERM GOAL #3   Title  Pt will increase typing speed to 20 wpm, 90% accuracy in prep for return to work    Time  3    Period  Weeks    Status  On-going   18 wpm     OT SHORT TERM GOAL #4   Title  Pt to perform environmental scanning w/ head turns and no LOB, min dizziness w/ 90% accuracy    Time  3    Period  Weeks    Status  On-going        OT Long Term Goals - 10/16/19 1718      OT LONG TERM GOAL #1   Title  Pt to report less drops Lt hand t/o day    Time  6    Period  Weeks    Status  On-going   1-2 drops/day     OT LONG TERM GOAL #2   Title  Pt to perform cleaning tasks for 20 min w/o rest mod I level    Time  6    Period  Weeks    Status  On-going   Pt reports cleaning vacuuming 1-2 rooms but then has to rest; pt requires several rest breaks     OT LONG TERM GOAL #3   Title  Pt to perform full cooking tasks mod I level  safely    Time  6    Period  Weeks    Status  On-going   not consistently, Pt reports heating somethin up, simple stovetop meal (eggs)     OT LONG TERM GOAL #4   Title  Pt to simulate work tasks w/ extra time as required    Time  6    Period  Weeks    Status  On-going            Plan - 10/16/19 1720    Clinical Impression Statement  Renewal completed today for additional 8 weeks (beginning next week). Pt making slow progress towards goals but remains limited by dizziness.    Occupational performance deficits (Please refer to evaluation for details):  IADL's;Work;Leisure    Rehab Potential  Good    OT Frequency  2x / week    OT Duration  8 weeks   additional weeks   OT Treatment/Interventions  Self-care/ADL training;Therapeutic exercise;Functional Mobility Training;Neuromuscular education;Manual Therapy;Therapeutic activities;Coping strategies training;DME and/or AE instruction;Cognitive remediation/compensation;Visual/perceptual remediation/compensation;Moist Heat;Passive range of motion;Patient/family education    Plan  continue typing, visual scanning    Consulted and Agree with Plan of Care  Patient       Patient will benefit from skilled therapeutic intervention in order to improve the following deficits and impairments:           Visit Diagnosis: Other symptoms and signs involving cognitive functions following cerebral infarction  Unsteadiness on feet  Other lack of coordination    Problem List Patient Active Problem List   Diagnosis Date Noted  . CVA (cerebral vascular accident) ([place]) 07/29/2019  . Dysphagia 07/29/2019  . Left hand weakness 07/29/2019  . Palpitations 09/15/2015  . Diastolic dysfunction 08/65/7846  . Renal cyst, left 06/19/2013  . History of migraine headaches 03/05/2013  . Complicated migraine 96/29/5284  . HLD (hyperlipidemia) 03/05/2013  . 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 Rotator cuff syndrome 03/05/2013  . Low back pain  03/05/2013    Carey Bullocks, OTR/L 10/16/2019, 5:24 PM  [facility] [phone]  [facility] [address] [place], [place], [address] Phone: [phone]   Fax:  [phone]  Name: Tara [name] MRN: [id number] Date of Birth: 10-06-1965

## 2019-10-20 ENCOUNTER — Ambulatory Visit: Payer: 59 | Admitting: Physical Therapy

## 2019-10-20 ENCOUNTER — Other Ambulatory Visit: Payer: Self-pay

## 2019-10-20 ENCOUNTER — Ambulatory Visit: Payer: 59 | Admitting: Occupational Therapy

## 2019-10-20 ENCOUNTER — Ambulatory Visit: Payer: 59

## 2019-10-20 VITALS — BP 118/78 | HR 78

## 2019-10-20 DIAGNOSIS — R471 Dysarthria and anarthria: Secondary | ICD-10-CM

## 2019-10-20 DIAGNOSIS — R42 Dizziness and giddiness: Secondary | ICD-10-CM

## 2019-10-20 DIAGNOSIS — R2681 Unsteadiness on feet: Secondary | ICD-10-CM | POA: Diagnosis not present

## 2019-10-20 DIAGNOSIS — R2689 Other abnormalities of gait and mobility: Secondary | ICD-10-CM | POA: Diagnosis not present

## 2019-10-20 DIAGNOSIS — R41841 Cognitive communication deficit: Secondary | ICD-10-CM | POA: Diagnosis not present

## 2019-10-20 DIAGNOSIS — I69318 Other symptoms and signs involving cognitive functions following cerebral infarction: Secondary | ICD-10-CM | POA: Diagnosis not present

## 2019-10-20 DIAGNOSIS — R278 Other lack of coordination: Secondary | ICD-10-CM | POA: Diagnosis not present

## 2019-10-20 DIAGNOSIS — M6281 Muscle weakness (generalized): Secondary | ICD-10-CM | POA: Diagnosis not present

## 2019-10-20 NOTE — Therapy (Signed)
[facility] [address] [place], [place], [address] Phone: [phone]   Fax:  [phone]  Speech Language Pathology Treatment  Patient Details  Name: Tara [name] MRN: [id number] Date of Birth: Dec 19, 1965 Referring Provider (SLP): Frann Rider NP   Encounter Date: 10/20/2019  End of Session - 10/20/19 1607    Visit Number  5    Number of Visits  17    Date for SLP Re-Evaluation  11/26/19    SLP Start Time  48    SLP Stop Time   1100    SLP Time Calculation (min)  40 min    Activity Tolerance  Patient tolerated treatment well       Past Medical History:  Diagnosis Date  . Asthma   . Hyperlipidemia   . Migraine   . Rotator cuff tear   . Spondylolisthesis    L4-5    Past Surgical History:  Procedure Laterality Date  . BUBBLE STUDY  07/30/2019   Procedure: BUBBLE STUDY;  Surgeon: Donato Heinz, MD;  Location: [facility];  Service: Cardiovascular;;  . LOOP RECORDER INSERTION N/A 07/30/2019   Procedure: LOOP RECORDER INSERTION;  Surgeon: Evans Lance, MD;  Location: [facility];  Service: Cardiovascular;  Laterality: N/A;  . No prior surgery    . TEE WITHOUT CARDIOVERSION N/A 07/30/2019   Procedure: TRANSESOPHAGEAL ECHOCARDIOGRAM (TEE);  Surgeon: Donato Heinz, MD;  Location: [facility] ENDOSCOPY;  Service: Cardiovascular;  Laterality: N/A;    Vitals:   10/20/19 1029  BP: 118/78  Pulse: 78    Subjective Assessment - 10/20/19 1028    Subjective  "I started to get some swimmy-headedness. Can you check my blood pressure?"            ADULT SLP TREATMENT - 10/20/19 1035      General Information   Behavior/Cognition  Alert;Cooperative;Pleasant mood      Treatment Provided   Treatment provided  Cognitive-Linquistic      Cognitive-Linquistic Treatment   Treatment focused on  Cognition    Skilled Treatment  "I didn't bring my notebook with me today." Pt reported completing  chores each day since last session, writing down chores in her notebook. Pt going to call about her hospital bill, do a load of laundry and cook dinner. Tomorrow pt will plan to fold clothes tomorrow. With questioning pt told SLP she will need to refresh" them tomorrow if she folds them tomorrow. Pt is still having difficulty with alternating attention.; not finishing tasks. Pt provided example of situatoin with alternating attention and pt reclled strategy disovered with SLP Camie Patience about listing items which distract her from completing her initial task. SLP learned pt has Alexa and so SLP printed off some commands for Alexa to create, add to and change to-do lists.       Assessment / Recommendations / Plan   Plan  Continue with current plan of care      Progression Toward Goals   Progression toward goals  Progressing toward goals       SLP Education - 10/20/19 1607    Education Details  Alexa commands which may help pt compensate for attention/memory    Person(s) Educated  Patient    Methods  Explanation;Handout    Comprehension  Verbalized understanding       SLP Short Term Goals - 10/20/19 1609      SLP SHORT TERM GOAL #1   Title  Pt will utiltize external aids  to schedule and complete daily tasks with rare min A over 3 sessions    Time  2    Period  Weeks    Status  On-going      SLP SHORT TERM GOAL #2   Title  Pt will uitlize compensatory strategies to attend to and process verbal instructions and converstion accurately with rare min A over 2 sessions    Time  2    Period  Weeks    Status  On-going      SLP SHORT TERM GOAL #3   Title  Pt will read 1 paragraph (5-7 sentences) and recall details with rare min A over 2 sessions    Time  2    Period  Weeks    Status  On-going       SLP Long Term Goals - 10/20/19 1609      SLP LONG TERM GOAL #1   Title  Pt will utilize compensations for attention to prepare a main dish for 3 meals with rare min A    Time  6    Period   Weeks    Status  On-going      SLP LONG TERM GOAL #2   Title  Pt will carryover compensatory strategies for attention and processing to run 3 errands in a row successfully with rare min A    Time  6    Period  Weeks    Status  On-going      SLP LONG TERM GOAL #3   Title  Pt will return to ADL/IADL task after a distraction and succesfully complete task before starting a new activity with rare min A over 3 sessions    Time  6    Period  Weeks    Status  On-going       Plan - 10/20/19 1608    Clinical Impression Statement  Melek continues to present with 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 Mild dysarthria characterized by sub WNL volume which does, rarely, affect intelligibility. Ongoing training of compensations for cogntive impairments for successful completion of IADL's, intelligiblilty. Continue skilled ST    Speech Therapy Frequency  2x / week    Duration  --   8 weeks or 17 visits   Treatment/Interventions  Language facilitation;Environmental controls;Cueing hierarchy;SLP instruction and feedback;Compensatory strategies;Functional tasks;Cognitive reorganization;Patient/family education;Multimodal communcation approach;Internal/external aids;Compensatory techniques    Potential to Achieve Goals  Good       Patient will benefit from skilled therapeutic intervention in order to improve the following deficits and impairments:   Cognitive communication deficit  Dysarthria and anarthria    Problem List Patient Active Problem List   Diagnosis Date Noted  . CVA (cerebral vascular accident) (HCC) 07/29/2019  . Dysphagia 07/29/2019  . Left hand weakness 07/29/2019  . Palpitations 09/15/2015  . Diastolic dysfunction 09/15/2015  . Renal cyst, left 06/19/2013  . History of migraine headaches 03/05/2013  . 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 HLD (hyperlipidemia) 03/05/2013  . 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  Rotator cuff syndrome 03/05/2013  . Low back pain 03/05/2013    [facility] ,MS, CCC-SLP  10/20/2019, 4:10 PM  [facility] [address] [place], [place], [address] Phone: [phone]   Fax:  [phone]   Name: Tara [name] MRN: [id number] Date of Birth: 09-09-1965

## 2019-10-20 NOTE — Therapy (Signed)
[facility] [address] [place], [place], [address] Phone: [phone]   Fax:  [phone]  Physical Therapy Treatment  Patient Details  Name: Tara [name] MRN: [id number] Date of Birth: 1965/12/31 No data recorded  Encounter Date: 10/20/2019  PT End of Session - 10/20/19 1244    Visit Number  17    Number of Visits  22    Authorization Type  [facility]    PT Start Time  7121    PT Stop Time  1232    PT Time Calculation (min)  47 min    Equipment Utilized During Treatment  Gait belt    Activity Tolerance  Patient tolerated treatment well   limited by dizziness; 2-3/10 at times, causes pt to stop, close eyes and rest from activity   Behavior During Therapy  [facility] for tasks assessed/performed       Past Medical History:  Diagnosis Date  . Asthma   . Hyperlipidemia   . Migraine   . Rotator cuff tear   . Spondylolisthesis    L4-5    Past Surgical History:  Procedure Laterality Date  . BUBBLE STUDY  07/30/2019   Procedure: BUBBLE STUDY;  Surgeon: Donato Heinz, MD;  Location: [facility];  Service: Cardiovascular;;  . LOOP RECORDER INSERTION N/A 07/30/2019   Procedure: LOOP RECORDER INSERTION;  Surgeon: Evans Lance, MD;  Location: [facility];  Service: Cardiovascular;  Laterality: N/A;  . No prior surgery    . TEE WITHOUT CARDIOVERSION N/A 07/30/2019   Procedure: TRANSESOPHAGEAL ECHOCARDIOGRAM (TEE);  Surgeon: Donato Heinz, MD;  [facility]: [facility];  Service: Cardiovascular;  Laterality: N/A;    There were no vitals filed for this visit.  Subjective Assessment - 10/20/19 1135    Subjective  Saturday was a better day for me-yesterday was too hot.  This morning I started my day with a woozy head ; have been doing some of my ironing and cleaning.  Have not gone to another store.  Was so tired yesterday, that I ended up taking a 2 hr. nap.    Patient Stated Goals  Pt's goals for  therapy are to strengthen to help with the tired feeling.    Currently in Pain?  No/denies                        [facility] Adult PT Treatment/Exercise - 10/20/19 0001      Transfers   Transfers  Sit to Stand;Stand to Sit    Sit to Stand  5: Supervision;Without upper extremity assist;From chair/3-in-1    Five time sit to stand comments   39.97 no UE support   46.69 sec   Stand to Sit  5: Supervision;Without upper extremity assist;To chair/3-in-1    Comments  Pt very guarded with sit to stand, decreased forward lean and slowed descent into sitting.      Ambulation/Gait   Ambulation/Gait  Yes    Ambulation/Gait Assistance  5: Supervision    Ambulation/Gait Assistance Details  Pt slows pace during curves/turns in gym area.    Ambulation Distance (Feet)  230 Feet    Assistive device  None    Gait Pattern  Step-through pattern;Decreased arm swing - right;Decreased arm swing - left;Decreased step length - left;Decreased step length - right;Decreased stride length;Decreased trunk rotation;Wide base of support   foot flat, decreased toe off to initiate stance phase   Ambulation Surface  Level;Indoor  Gait velocity  18.74 sec = 1.75 ft/sec     Stairs  Yes    Stairs Assistance  5: Supervision    Stair Management Technique  Two rails;Alternating pattern;Forwards    Number of Stairs  4    Height of Stairs  6      Standardized Balance Assessment   Standardized Balance Assessment  Dynamic Gait Index      Dynamic Gait Index   Level Surface  Mild Impairment    Change in Gait Speed  Mild Impairment    Gait with Horizontal Head Turns  Moderate Impairment    Gait with Vertical Head Turns  Moderate Impairment    Gait and Pivot Turn  Moderate Impairment   11.4 sec for 180 turns   Step Over Obstacle  Moderate Impairment    Step Around Obstacles  Mild Impairment    Steps  Mild Impairment    Total Score  12          Balance Exercises - 10/20/19 1239      Balance Exercises:  Standing   Other Standing Exercises  Stagger stance forward/back rocking x 10 reps each foot position; heel/toe raises x 10 reps.  Cues with stagger stance positioning to focus on push-off for improved push-off with gait, followed by step through pattern of gait 15 ft with cues for increased heelstrike and increased toe off.        PT Education - 10/20/19 1242    Education Details  Progress towards goals (pt's progress is slow, but pt has made slight improvements since end of April measures); discussed POC, importance of continued movement with daily activities, rest with fatigue (fatigue related to CVA)    Person(s) Educated  Patient    Methods  Explanation    Comprehension  Verbalized understanding       PT Short Term Goals - 09/10/19 0944      PT SHORT TERM GOAL #1   Title  Pt will be independent with HEP for improved strength, balance, and gait.  TARGET for all STGs:  4 weeks:  09/05/2019    Time  4    Period  Weeks    Status  Achieved      PT SHORT TERM GOAL #2   Title  Pt will improve 5x sit<>stand to less than or equal to 15 seconds for improved functional lower extremity strength.    Baseline  21.68 seconds on 09/03/19    Time  4    Period  Weeks    Status  Not Met      PT SHORT TERM GOAL #3   Title  Pt will improve DGI score to at least 16/24 for decresaed fall risk.    Baseline  10/24 on 09/10/19    Time  4    Period  Weeks    Status  Not Met      PT SHORT TERM GOAL #4   Title  Pt will verbalize understanding of:  fall prevention in home environment and CVA warning signs/symptoms    Time  4    Period  Weeks    Status  Achieved        PT Long Term Goals - 10/20/19 1211      PT LONG TERM GOAL #1   Title  Pt will be independent with progression of HEP to address strength, balance, gait, and vestibular impairments.  TARGET 10/23/2019 (may be delayed due to scheduling)    Baseline  pt will continue  to benefit from revisions/additions to HEP    Time  4    Period   Weeks    Status  On-going      PT LONG TERM GOAL #2   Title  Pt will improve 5x sit<>stand to less than or equal to 22 seconds for improved functional strength.    Baseline  38.9 seconds with no UE support from standard chair, 39.97 sec 10/20/2019    Time  4    Period  Weeks    Status  Not Met      PT LONG TERM GOAL #3   Title  Pt will improve DGI score to at least 13/24 for decreased fall risk and improved functional mobility.    Baseline  9/24 on 09/25/19; 12/24 10/20/2019    Time  4    Period  Weeks    Status  Not Met      PT LONG TERM GOAL #4   Title  Pt will ambulate at least 500 ft, indoor and outdoor surfaces, with supervision and no LOB for return to independent community gait.    Baseline  not performed today due to time constraints; 230 ft indoors    Time  4    Period  Weeks    Status  Not Met      PT LONG TERM GOAL #5   Title  Pt will improve gait speed to at least 1.9 ft/sec for improved gait efficiency and decr fall risk.    Baseline  1.57 ft/sec on 09/25/19; 1.75 ft/sec    Time  4    Period  Weeks    Status  Not Met        New LTGs for renewal: PT Long Term Goals - 10/20/19 1258      PT LONG TERM GOAL #1   Title  Pt will be independent with progression of HEP to address strength, balance, gait, and vestibular impairments.  TARGET 11/26/2019 for all LTGs    Baseline  pt will continue to benefit from further revisions/additions to HEP    Time  5    Period  Weeks    Status  On-going      PT LONG TERM GOAL #2   Title  Pt perform sit>stand in less than 5 seconds for improved transfers and initiation of gait.    Baseline  38.9 seconds with no UE support from standard chair, 39.97 sec 10/20/2019; 1st sit<>stand takes >9 seconds    Time  5    Period  Weeks    Status  Revised      PT LONG TERM GOAL #3   Title  Pt will improve DGI score to at least 15/24 for decreased fall risk and improved functional mobility.    Baseline  9/24 on 09/25/19; 12/24 10/20/2019    Time   5    Period  Weeks    Status  Revised      PT LONG TERM GOAL #4   Title  Pt will ambulate at least 500 ft, indoor and outdoor surfaces, with supervision and no LOB for return to independent community gait.    Baseline  230 ft indoors 10/20/2019    Time  5    Period  Weeks    Status  On-going      PT LONG TERM GOAL #5   Title  Pt will improve gait speed to at least 2 ft/sec for improved gait efficiency and decr fall risk.    Baseline  1.57 ft/sec on 09/25/19; 1.75 ft/sec    Time  5    Period  Weeks    Status  Revised            Plan - 10/20/19 1249    Clinical Impression Statement  Assessed pt's LTGs this visit, with pt meeting LTG 1 for HEP (she performs updated exercises as recommended).  LTG 2-5 not met; LTG 2 for 5x sit<>stand not met, with slowed sit<>stand 39.97 sec; LTG 3 not met for DGI, with score 12/24 (improved from 9/24).  LTG 4 not met for longer distance gait (after about 230 ft, pt reports 2-3/10 dizziness and needs to stop for rest break).  LTG 5 not met, with gait velocity 1.75 ft/sec (improved from last measure late April of 1.57 ft/sec).  Measures are overall slower compared to eval in March, but have improved slowly from last measures taken in April.  Pt remains guarded with all mobility, including sit<>stand, gait, and turns.  She has reported transition to some community mobility, including stores, but only for short bouts of time, due to feeling overstimulated.  With pt's slight improvements in the past month, will plan to conitnue PT to work on motion sensitivity, improved overall functional mobility, gait, transfers.  Pt is to see neurologist in approx 1 month; discussed with pt that if she isn't making further progress at that time, we may discuss discharging therapy at that time.    Personal Factors and Comorbidities  Comorbidity 3+    Comorbidities  PMH:  asthma, hyperlipidemia, spondyloslisthesis, R RTC tear    Examination-Activity Limitations  Locomotion  Level;Transfers;Stand;Stairs    Examination-Participation Restrictions  Community Activity;Yard Work;Other   walking for exercise in neighborhood, return to work   Stability/Clinical Decision Making  Evolving/Moderate complexity    Rehab Potential  Good    PT Frequency  1x / week    PT Duration  Other (comment)   5 more weeks, per recert 0/02/711   PT Treatment/Interventions  ADLs/Self Care Home Management;Gait training;Stair training;Functional mobility training;Therapeutic activities;Therapeutic exercise;Balance training;Neuromuscular re-education;DME Instruction;Patient/family education;Electrical Stimulation;Orthotic Fit/Training;Vestibular    PT Next Visit Plan  NuStep for movement. standing weight shifting -fully review HEP and make sure pt is performing at home; gait along counter-forward/back, forward with head turns; work on increasing speed and excursion of movements; corner balance, corner trunk rotation    Consulted and Agree with Plan of Care  Patient       Patient will benefit from skilled therapeutic intervention in order to improve the following deficits and impairments:  Abnormal gait, Difficulty walking, Decreased endurance, Decreased balance, Decreased activity tolerance, Decreased strength, Decreased mobility  Visit Diagnosis: Other abnormalities of gait and mobility  Unsteadiness on feet  Other symptoms and signs involving cognitive functions following cerebral infarction  Dizziness and giddiness  Muscle weakness (generalized)     Problem List Patient Active Problem List   Diagnosis Date Noted  . CVA (cerebral vascular accident) ([facility]) 07/29/2019  . Dysphagia 07/29/2019  . Left hand weakness 07/29/2019  . Palpitations 09/15/2015  . Diastolic dysfunction 19/75/8832  . Renal cyst, left 06/19/2013  . History of migraine headaches 03/05/2013  . Complicated migraine 54/98/2641  . HLD (hyperlipidemia) 03/05/2013  . 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 Rotator  cuff syndrome 03/05/2013  . Low back pain 03/05/2013    Moyinoluwa Dawe W. 10/20/2019, 12:57 PM  Frazier Butt., PT   [facility] [address] [place], [place], [address] Phone: [phone]  Fax:  [phone]  Name: Tara [name] MRN: [id number] Date of Birth: 02/27/1966

## 2019-10-20 NOTE — Patient Instructions (Addendum)
Alexa commands: To-do and shopping lists  Add task to to-do list: "Alexa, add 'go to the grocery store' to my to-do list" or "Alexa, I need to make an appointment with the doctor."  Create a new to-do item: "Alexa, create a to-do."  Check calendar events: "Alexa, what's on my calendar for tomorrow?"  Add an event to a calendar: "Alexa, add [event] to my calendar for [day] at [time]" or "Alexa, add an event to my calendar."  Move a calendar event: "Alexa, move my meeting from 82 to 3."  Create a shopping list: "Alexa, add eggs to my shopping list" or "Alexa, I need to buy laundry detergent."  Check your shopping list: "Alexa, what's on my shopping list?"  Create a reminder: "Alexa, reminder" or "Alexa, remind me to check the oven in 5 minutes."  Check on existing reminders: "Alexa, what are my reminders this weekend?" or "Alexa, what reminders do I have tomorrow?"

## 2019-10-21 ENCOUNTER — Ambulatory Visit: Payer: 59 | Admitting: Occupational Therapy

## 2019-10-21 ENCOUNTER — Ambulatory Visit: Payer: 59 | Admitting: Speech Pathology

## 2019-10-21 DIAGNOSIS — M6281 Muscle weakness (generalized): Secondary | ICD-10-CM | POA: Diagnosis not present

## 2019-10-21 DIAGNOSIS — R2681 Unsteadiness on feet: Secondary | ICD-10-CM

## 2019-10-21 DIAGNOSIS — R278 Other lack of coordination: Secondary | ICD-10-CM

## 2019-10-21 DIAGNOSIS — R2689 Other abnormalities of gait and mobility: Secondary | ICD-10-CM | POA: Diagnosis not present

## 2019-10-21 DIAGNOSIS — R471 Dysarthria and anarthria: Secondary | ICD-10-CM | POA: Diagnosis not present

## 2019-10-21 DIAGNOSIS — I69318 Other symptoms and signs involving cognitive functions following cerebral infarction: Secondary | ICD-10-CM | POA: Diagnosis not present

## 2019-10-21 DIAGNOSIS — R41841 Cognitive communication deficit: Secondary | ICD-10-CM | POA: Diagnosis not present

## 2019-10-21 DIAGNOSIS — R42 Dizziness and giddiness: Secondary | ICD-10-CM | POA: Diagnosis not present

## 2019-10-21 NOTE — Therapy (Signed)
[facility] [address] [place], [place], [address] Phone: [phone]   Fax:  [phone]  Occupational Therapy Treatment  Patient Details  Name: Tara [name] MRN: [id number] Date of Birth: [birth year] No data recorded  Encounter Date: 10/21/2019  OT End of Session - 10/21/19 1047    Visit Number  13    Number of Visits  28    Date for OT Re-Evaluation  12/14/19    Authorization Type  [facility] [employer]    Authorization Time Period  week 1/8    OT Start Time  1023    OT Stop Time  1102    OT Time Calculation (min)  39 min    Activity Tolerance  Patient tolerated treatment well    Behavior During Therapy  [facility] for tasks assessed/performed       Past Medical History:  Diagnosis Date  . Asthma   . Hyperlipidemia   . Migraine   . Rotator cuff tear   . Spondylolisthesis    L4-5    Past Surgical History:  Procedure Laterality Date  . BUBBLE STUDY  07/30/2019   Procedure: BUBBLE STUDY;  Surgeon: [name], MD;  Location: [facility] ENDOSCOPY;  Service: Cardiovascular;;  . LOOP RECORDER INSERTION N/A 07/30/2019   Procedure: LOOP RECORDER INSERTION;  Surgeon: [name], MD;  Location: [facility] INVASIVE CV LAB;  Service: Cardiovascular;  Laterality: N/A;  . No prior surgery    . TEE WITHOUT CARDIOVERSION N/A 07/30/2019   Procedure: TRANSESOPHAGEAL ECHOCARDIOGRAM (TEE);  Surgeon: [name], MD;  Location: [facility] ENDOSCOPY;  Service: Cardiovascular;  Laterality: N/A;    There were no vitals filed for this visit.  Subjective Assessment - 10/21/19 1047    Pertinent History  CVA 07/29/19. PMH: HLD, migraines, asthma, small chronic cerebellar infarcts    Limitations  fall risk, loop recorder, ? swallowing precautions (chin tuck)    Patient Stated Goals  typing, cooking, cleaning, driving, working    Currently in Pain?  No/denies      Standing: pt copying small peg design on vertical surface for visual scanning,  looking vertically up/down in smaller ranges, and for dynamic standing. Pt dropped 2 pegs from Lt hand however copied design 100% accurate. Therapist then moved peg container further down (on chair) to increase vertical head turns to remove pegs from pegboard and place in container - pt w/ LOB posteriorly x 1 and required UE support near end of task due to pt feeling like she was moving.  Typing (copying article from magazine onto word document) with extra time required and line guide needed for smaller text. Pt with multiple errors in typing mostly not hitting space key, min errors between s and a.                        OT Short Term Goals - 10/07/19 1408      OT SHORT TERM GOAL #1   Title  Independent with HEP for LUE strengthening (shoulder and hand)    Time  3    Period  Weeks    Status  Achieved      OT SHORT TERM GOAL #2   Title  Grip strength Lt hand to be 45 lbs or greater    Baseline  35 lbs (Rt = 56)    Time  3    Period  Weeks    Status  On-going   42.5, 46- inconsistent  OT SHORT TERM GOAL #3   Title  Pt will increase typing speed to 20 wpm, 90% accuracy in prep for return to work    Time  3    Period  Weeks    Status  On-going   18 wpm     OT SHORT TERM GOAL #4   Title  Pt to perform environmental scanning w/ head turns and no LOB, min dizziness w/ 90% accuracy    Time  3    Period  Weeks    Status  On-going        OT Long Term Goals - 10/16/19 1718      OT LONG TERM GOAL #1   Title  Pt to report less drops Lt hand t/o day    Time  6    Period  Weeks    Status  On-going   1-2 drops/day     OT LONG TERM GOAL #2   Title  Pt to perform cleaning tasks for 20 min w/o rest mod I level    Time  6    Period  Weeks    Status  On-going   Pt reports cleaning vacuuming 1-2 rooms but then has to rest; pt requires several rest breaks     OT LONG TERM GOAL #3   Title  Pt to perform full cooking tasks mod I level safely    Time  6    Period   Weeks    Status  On-going   not consistently, Pt reports heating somethin up, simple stovetop meal (eggs)     OT LONG TERM GOAL #4   Title  Pt to simulate work tasks w/ extra time as required    Time  6    Period  Weeks    Status  On-going            Plan - 10/21/19 1050    Clinical Impression Statement  Pt progressing with typing with extra time required and use of line guide to copy smaller print text.    Occupational performance deficits (Please refer to evaluation for details):  IADL's;Work;Leisure    Body Structure / Function / Physical Skills  ADL;IADL;Strength;Coordination;FMC;UE functional use;Vision    Cognitive Skills  Perception;Problem Solve;Attention    Rehab Potential  Good    OT Frequency  2x / week    OT Duration  8 weeks    OT Treatment/Interventions  Self-care/ADL training;Therapeutic exercise;Functional Mobility Training;Neuromuscular education;Manual Therapy;Therapeutic activities;Coping strategies training;DME and/or AE instruction;Cognitive remediation/compensation;Visual/perceptual remediation/compensation;Moist Heat;Passive range of motion;Patient/family education    Plan  continue progress towards goals    Consulted and Agree with Plan of Care  Patient       Patient will benefit from skilled therapeutic intervention in order to improve the following deficits and impairments:   Body Structure / Function / Physical Skills: ADL, IADL, Strength, Coordination, FMC, UE functional use, Vision Cognitive Skills: Perception, Problem Solve, Attention     Visit Diagnosis: Unsteadiness on feet  Other lack of coordination  Other symptoms and signs involving cognitive functions following cerebral infarction    Problem List Patient Active Problem List   Diagnosis Date Noted  . CVA (cerebral vascular accident) ([place]) 07/29/2019  . Dysphagia 07/29/2019  . Left hand weakness 07/29/2019  . Palpitations 09/15/2015  . Diastolic dysfunction 68/34/1962  . Renal  cyst, left 06/19/2013  . History of migraine headaches 03/05/2013  . Complicated migraine 22/97/9892  . HLD (hyperlipidemia) 03/05/2013  . 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  Rotator cuff syndrome 03/05/2013  . Low back pain 03/05/2013    Kelli Churn, OTR/L 10/21/2019, 10:54 AM  [facility] [address] [place], [place], [address] Phone: [phone]   Fax:  [phone]  Name: Tara [name] MRN: [id number] Date of Birth: 1965/09/08

## 2019-10-28 ENCOUNTER — Ambulatory Visit: Payer: 59 | Attending: Family Medicine

## 2019-10-28 ENCOUNTER — Other Ambulatory Visit: Payer: Self-pay

## 2019-10-28 DIAGNOSIS — R42 Dizziness and giddiness: Secondary | ICD-10-CM | POA: Diagnosis present

## 2019-10-28 DIAGNOSIS — R2681 Unsteadiness on feet: Secondary | ICD-10-CM | POA: Insufficient documentation

## 2019-10-28 DIAGNOSIS — I69318 Other symptoms and signs involving cognitive functions following cerebral infarction: Secondary | ICD-10-CM | POA: Insufficient documentation

## 2019-10-28 DIAGNOSIS — R278 Other lack of coordination: Secondary | ICD-10-CM | POA: Insufficient documentation

## 2019-10-28 DIAGNOSIS — R41841 Cognitive communication deficit: Secondary | ICD-10-CM | POA: Insufficient documentation

## 2019-10-28 DIAGNOSIS — R2689 Other abnormalities of gait and mobility: Secondary | ICD-10-CM | POA: Diagnosis not present

## 2019-10-28 DIAGNOSIS — R471 Dysarthria and anarthria: Secondary | ICD-10-CM | POA: Insufficient documentation

## 2019-10-28 DIAGNOSIS — M6281 Muscle weakness (generalized): Secondary | ICD-10-CM | POA: Insufficient documentation

## 2019-10-28 NOTE — Therapy (Signed)
[facility] [address] [place], [place], [address] Phone: [phone]   Fax:  [phone]  Speech Language Pathology Treatment  Patient Details  Name: Tara [name] MRN: [id number] Date of Birth: 1965/11/15 Referring Provider (SLP): Frann Rider NP   Encounter Date: 10/28/2019  End of Session - 10/28/19 1716    Visit Number  6    Number of Visits  17    Date for SLP Re-Evaluation  11/26/19    SLP Start Time  1150    SLP Stop Time   1230    SLP Time Calculation (min)  40 min    Activity Tolerance  Patient tolerated treatment well       Past Medical History:  Diagnosis Date  . Asthma   . Hyperlipidemia   . Migraine   . Rotator cuff tear   . Spondylolisthesis    L4-5    Past Surgical History:  Procedure Laterality Date  . BUBBLE STUDY  07/30/2019   Procedure: BUBBLE STUDY;  Surgeon: Donato Heinz, MD;  Location: [facility];  Service: Cardiovascular;;  . LOOP RECORDER INSERTION N/A 07/30/2019   Procedure: LOOP RECORDER INSERTION;  Surgeon: Evans Lance, MD;  Location: [place] CV LAB;  Service: Cardiovascular;  Laterality: N/A;  . No prior surgery    . TEE WITHOUT CARDIOVERSION N/A 07/30/2019   Procedure: TRANSESOPHAGEAL ECHOCARDIOGRAM (TEE);  Surgeon: Donato Heinz, MD;  Location: [facility] ENDOSCOPY;  Service: Cardiovascular;  Laterality: N/A;    There were no vitals filed for this visit.  Subjective Assessment - 10/28/19 1159    Subjective  Pt arrived at 1155 for 1145 appointment.    Currently in Pain?  Yes    Pain Score  4     Pain Location  Hand    Pain Orientation  Left    Pain Descriptors / Indicators  Aching;Numbness    Pain Radiating Towards  down arm to elbow    Pain Onset  1 to 4 weeks ago    Pain Frequency  Intermittent    Effect of Pain on Daily Activities  interrups sleep            ADULT SLP TREATMENT - 10/28/19 1202      General Information    Behavior/Cognition  Alert;Cooperative;Pleasant mood      Treatment Provided   Treatment provided  Cognitive-Linquistic      Cognitive-Linquistic Treatment   Treatment focused on  Cognition    Skilled Treatment  Pt reports incr'd fatigue compared to pre-morbidly. SLP told pt SLP concerned that physcial fatigue will affect mental function at work especially - SLP   suggested graduated return to work. NP explained finger/hand pain and numbness to pt - NP stated pain would go away however has not. "I use the Alexa now for the to-do-list". SLP learned pt is now using Alexa for reminder for appointments but looking on paper if necessary for exact time. Pt reported she now has incr'd her chores to 2-3 per day plus dinner via [employer] reminders. Pt has less distractions from task than 2 weeks ago. Pt stated she is scheduled to return to work next Monday 11-04-19 and has MD appointment 10-31-19 with PCP. Today, SLP read information for 30-45 seconds and pt took notes to simulate detailed patient reports. Pt took notes  "It's good with me if I take notes." This is a practice pt had prior to CVA. Pt recalled details of these selections with success. SLP problem  solved with pt how to minimize distraction  - pt will use the telephone talking to a patient - "I will make sure there is nothing going to distract me."      Assessment / Recommendations / Plan   Plan  Continue with current plan of care      Progression Toward Goals   Progression toward goals  Progressing toward goals         SLP Short Term Goals - 10/28/19 1717      SLP SHORT TERM GOAL #1   Title  Pt will utiltize external aids to schedule and complete daily tasks with rare min A over 3 sessions    Baseline  10-28-19    Time  1    Period  Weeks    Status  On-going      SLP SHORT TERM GOAL #2   Title  Pt will uitlize compensatory strategies to attend to and process verbal instructions and converstion accurately with rare min A over 2 sessions     Baseline  10-28-19    Time  1    Period  Weeks    Status  On-going      SLP SHORT TERM GOAL #3   Title  Pt will read 1 paragraph (5-7 sentences) and recall details with rare min A over 2 sessions    Time  1    Period  Weeks    Status  On-going       SLP Long Term Goals - 10/20/19 1609      SLP LONG TERM GOAL #1   Title  Pt will utilize compensations for attention to prepare a main dish for 3 meals with rare min A    Time  6    Period  Weeks    Status  On-going      SLP LONG TERM GOAL #2   Title  Pt will carryover compensatory strategies for attention and processing to run 3 errands in a row successfully with rare min A    Time  6    Period  Weeks    Status  On-going      SLP LONG TERM GOAL #3   Title  Pt will return to ADL/IADL task after a distraction and succesfully complete task before starting a new activity with rare min A over 3 sessions    Time  6    Period  Weeks    Status  On-going       Plan - 10/28/19 1716    Clinical Impression Statement  Tara [name] continues to present with cognitive linguistic impairments of attention, processing, and visuospatial skills. Pt reports using more compensations including smart speaker in the past week, and imroved alternating atteniton at home. She wrote notes adequate for recall of facts in 45 seconds of auditory information with independence. Pt voice loudness improving. Ongoing training of compensations for cogntive impairments for successful completion of IADL's, intelligiblilty. Continue skilled ST    Speech Therapy Frequency  2x / week    Duration  --   8 weeks or 17 visits   Treatment/Interventions  Language facilitation;Environmental controls;Cueing hierarchy;SLP instruction and feedback;Compensatory strategies;Functional tasks;Cognitive reorganization;Patient/family education;Multimodal communcation approach;Internal/external aids;Compensatory techniques    Potential to Achieve Goals  Good       Patient will benefit from  skilled therapeutic intervention in order to improve the following deficits and impairments:   Cognitive communication deficit  Dysarthria and anarthria    Problem List Patient Active Problem List  Diagnosis Date Noted  . CVA (cerebral vascular accident) ([facility]) 07/29/2019  . Dysphagia 07/29/2019  . Left hand weakness 07/29/2019  . Palpitations 09/15/2015  . Diastolic dysfunction 09/15/2015  . Renal cyst, left 06/19/2013  . History of migraine headaches 03/05/2013  . 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 HLD (hyperlipidemia) 03/05/2013  . 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 Rotator cuff syndrome 03/05/2013  . Low back pain 03/05/2013    [facility] ,MS, CCC-SLP  10/28/2019, 5:20 PM  [facility] [address] [place], [place], [address] Phone: [phone]   Fax:  [phone]   Name: Tara [name] MRN: [id number] Date of Birth: 23-Aug-1965

## 2019-10-29 ENCOUNTER — Ambulatory Visit: Payer: 59

## 2019-10-29 ENCOUNTER — Ambulatory Visit: Payer: 59 | Admitting: Occupational Therapy

## 2019-10-29 ENCOUNTER — Ambulatory Visit: Payer: 59 | Admitting: Physical Therapy

## 2019-10-29 DIAGNOSIS — R2689 Other abnormalities of gait and mobility: Secondary | ICD-10-CM

## 2019-10-29 DIAGNOSIS — M6281 Muscle weakness (generalized): Secondary | ICD-10-CM

## 2019-10-29 DIAGNOSIS — R41841 Cognitive communication deficit: Secondary | ICD-10-CM | POA: Diagnosis not present

## 2019-10-29 DIAGNOSIS — R42 Dizziness and giddiness: Secondary | ICD-10-CM | POA: Diagnosis not present

## 2019-10-29 DIAGNOSIS — E559 Vitamin D deficiency, unspecified: Secondary | ICD-10-CM | POA: Diagnosis not present

## 2019-10-29 DIAGNOSIS — R2681 Unsteadiness on feet: Secondary | ICD-10-CM

## 2019-10-29 DIAGNOSIS — R471 Dysarthria and anarthria: Secondary | ICD-10-CM

## 2019-10-29 DIAGNOSIS — I69318 Other symptoms and signs involving cognitive functions following cerebral infarction: Secondary | ICD-10-CM

## 2019-10-29 DIAGNOSIS — R000 Tachycardia, unspecified: Secondary | ICD-10-CM | POA: Diagnosis not present

## 2019-10-29 DIAGNOSIS — G43109 Migraine with aura, not intractable, without status migrainosus: Secondary | ICD-10-CM | POA: Diagnosis not present

## 2019-10-29 DIAGNOSIS — R278 Other lack of coordination: Secondary | ICD-10-CM

## 2019-10-29 DIAGNOSIS — E7800 Pure hypercholesterolemia, unspecified: Secondary | ICD-10-CM | POA: Diagnosis not present

## 2019-10-29 MED FILL — ROSUVASTATIN CALCIUM 40 MG: 40 | 30 days supply | Qty: 30 | Fill #0

## 2019-10-29 NOTE — Therapy (Signed)
[facility] [address] [place], [place], [address] Phone: [phone]   Fax:  [phone]  Occupational Therapy Treatment  Patient Details  Name: Tara [name] MRN: [id number] Date of Birth: 08/21/1965 No data recorded  Encounter Date: 10/29/2019  OT End of Session - 10/29/19 1629    Visit Number  14    Number of Visits  28    Date for OT Re-Evaluation  12/14/19    Authorization Type  [facility]    Authorization Time Period  week 2/8    OT Start Time  1620    OT Stop Time  1700    OT Time Calculation (min)  40 min    Activity Tolerance  Patient tolerated treatment well    Behavior During Therapy  [facility] for tasks assessed/performed       Past Medical History:  Diagnosis Date  . Asthma   . Hyperlipidemia   . Migraine   . Rotator cuff tear   . Spondylolisthesis    L4-5    Past Surgical History:  Procedure Laterality Date  . BUBBLE STUDY  07/30/2019   Procedure: BUBBLE STUDY;  Surgeon: [name], MD;  Location: [facility] ENDOSCOPY;  Service: Cardiovascular;;  . LOOP RECORDER INSERTION N/A 07/30/2019   Procedure: LOOP RECORDER INSERTION;  Surgeon: [name], MD;  Location: [facility] INVASIVE CV LAB;  Service: Cardiovascular;  Laterality: N/A;  . No prior surgery    . TEE WITHOUT CARDIOVERSION N/A 07/30/2019   Procedure: TRANSESOPHAGEAL ECHOCARDIOGRAM (TEE);  Surgeon: [name], MD;  Location: [facility] ENDOSCOPY;  Service: Cardiovascular;  Laterality: N/A;    There were no vitals filed for this visit.  Subjective Assessment - 10/29/19 1750    Subjective   Pt plans to reurn to work next week, therapist recommends pt returns part time.    Currently in Pain?  Yes    Pain Score  2     Pain Location  Hand    Pain Orientation  Left    Pain Descriptors / Indicators  Numbness    Pain Type  Acute pain    Pain Onset  1 to 4 weeks ago    Pain Frequency  Intermittent    Aggravating Factors   use    Pain  Relieving Factors  unknown           Typing test 14 wpm, 80% accuracy. Typing activity, word tris for visual input and typing speed, dizziness increased to 2/10 Dynamic functional reaching activity to retrieve graded clothespins from low surface with LUE and place on vertical antennae with LUE, pt required close supervision and multiple rest breaks due to dizziness to complete task. Arm bike x 5 mins level 3 for conditioning                  OT Short Term Goals - 10/07/19 1408      OT SHORT TERM GOAL #1   Title  Independent with HEP for LUE strengthening (shoulder and hand)    Time  3    Period  Weeks    Status  Achieved      OT SHORT TERM GOAL #2   Title  Grip strength Lt hand to be 45 lbs or greater    Baseline  35 lbs (Rt = 56)    Time  3    Period  Weeks    Status  On-going   42.5, 46- inconsistent     OT SHORT  TERM GOAL #3   Title  Pt will increase typing speed to 20 wpm, 90% accuracy in prep for return to work    Time  3    Period  Weeks    Status  On-going   18 wpm     OT SHORT TERM GOAL #4   Title  Pt to perform environmental scanning w/ head turns and no LOB, min dizziness w/ 90% accuracy    Time  3    Period  Weeks    Status  On-going        OT Long Term Goals - 10/16/19 1718      OT LONG TERM GOAL #1   Title  Pt to report less drops Lt hand t/o day    Time  6    Period  Weeks    Status  On-going   1-2 drops/day     OT LONG TERM GOAL #2   Title  Pt to perform cleaning tasks for 20 min w/o rest mod I level    Time  6    Period  Weeks    Status  On-going   Pt reports cleaning vacuuming 1-2 rooms but then has to rest; pt requires several rest breaks     OT LONG TERM GOAL #3   Title  Pt to perform full cooking tasks mod I level safely    Time  6    Period  Weeks    Status  On-going   not consistently, Pt reports heating somethin up, simple stovetop meal (eggs)     OT LONG TERM GOAL #4   Title  Pt to simulate work tasks w/  extra time as required    Time  6    Period  Weeks    Status  On-going            Plan - 10/29/19 1630    Clinical Impression Statement  Pt is progressing towards goals with improving tolerance of visual input on computer during activities. Pt continues to be significantly limited in activity tolerance due to dizziness.    Occupational performance deficits (Please refer to evaluation for details):  IADL's;Work;Leisure    Body Structure / Function / Physical Skills  ADL;IADL;Strength;Coordination;FMC;UE functional use;Vision    Cognitive Skills  Perception;Problem Solve;Attention    Rehab Potential  Good    OT Frequency  2x / week    OT Duration  8 weeks    OT Treatment/Interventions  Self-care/ADL training;Therapeutic exercise;Functional Mobility Training;Neuromuscular education;Manual Therapy;Therapeutic activities;Coping strategies training;DME and/or AE instruction;Cognitive remediation/compensation;Visual/perceptual remediation/compensation;Moist Heat;Passive range of motion;Patient/family education    Plan  continue progress towards goals    Consulted and Agree with Plan of Care  Patient       Patient will benefit from skilled therapeutic intervention in order to improve the following deficits and impairments:   Body Structure / Function / Physical Skills: ADL, IADL, Strength, Coordination, FMC, UE functional use, Vision Cognitive Skills: Perception, Problem Solve, Attention     Visit Diagnosis: Other lack of coordination  Other symptoms and signs involving cognitive functions following cerebral infarction  Other abnormalities of gait and mobility  Muscle weakness (generalized)    Problem List Patient Active Problem List   Diagnosis Date Noted  . CVA (cerebral vascular accident) ([place]) 07/29/2019  . Dysphagia 07/29/2019  . Left hand weakness 07/29/2019  . Palpitations 09/15/2015  . Diastolic dysfunction 86/57/8469  . Renal cyst, left 06/19/2013  . History of  migraine headaches 03/05/2013  .  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 HLD (hyperlipidemia) 03/05/2013  . 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 Rotator cuff syndrome 03/05/2013  . Low back pain 03/05/2013    Xadrian Craighead 10/29/2019, 5:52 PM  [facility] [address] [place], [place], [address] Phone: [phone]   Fax:  [phone]  Name: Tara [name] MRN: [id number] Date of Birth: Jan 04, 1966
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
## 2019-10-29 NOTE — Patient Instructions (Addendum)
Access Code: [email or web address] URL: [email or web address] Date: 10/29/2019 Prepared by: Lonia Blood  Exercises Sit to Stand - 1-2 x daily - 5 x weekly - 3 sets - 5 reps Standing Marching - 1-2 x daily - 5 x weekly - 3 sets Standing Balance with Eyes Closed - 2 x daily - 5 x weekly - 3 sets - 15 hold Tandem Stance - 1 x daily - 5 x weekly - 3 sets - 20 hold Seated Nose to Left Knee Vestibular Habituation - 2 x daily - 5 x weekly - 1 sets - 3-4 reps Seated Nose to Right Knee Vestibular Habituation - 2 x daily - 5 x weekly - 1 sets - 3-4 reps Vestibular Habituation - Seated Horizontal Head Rotation - 1 x daily - 5 x weekly - 3 sets - 5 reps Seated Head Nods Vestibular Habituation - 1 x daily - 5 x weekly - 3 sets - 5 reps Lateral Weight Shift with Arm Raise - 1 x daily - 5 x weekly - 1 sets - 10 reps Standing Quarter Turn with Counter Support - 1 x daily - 5 x weekly - 1 sets - 5 reps Standing Gaze Stabilization with Head Rotation - 1-2 x daily - 5 x weekly - 3 sets - 10 reps   Verbally added side stepping, forward/back walking, forward walk with head turns at counter to try at home

## 2019-10-30 NOTE — Therapy (Signed)
[facility] [address] [place], [place], [address] Phone: [phone]   Fax:  [phone]  Physical Therapy Treatment  Patient Details  Name: Tara [name] MRN: [id number] Date of Birth: December 11, 1965 No data recorded  Encounter Date: 10/29/2019  PT End of Session - 10/30/19 1555    Visit Number  18    Number of Visits  22    Authorization Type  [facility]    PT Start Time  1705   Pt used the restroom between OT and PT   PT Stop Time  1749    PT Time Calculation (min)  44 min    Equipment Utilized During Treatment  Gait belt    Activity Tolerance  Patient tolerated treatment well   limited/slowed by dizziness; 1-2/10 at times, causes pt to stop, close eyes and rest from activity   Behavior During Therapy  [facility] for tasks assessed/performed       Past Medical History:  Diagnosis Date  . Asthma   . Hyperlipidemia   . Migraine   . Rotator cuff tear   . Spondylolisthesis    L4-5    Past Surgical History:  Procedure Laterality Date  . BUBBLE STUDY  07/30/2019   Procedure: BUBBLE STUDY;  Surgeon: Donato Heinz, MD;  Location: [facility];  Service: Cardiovascular;;  . LOOP RECORDER INSERTION N/A 07/30/2019   Procedure: LOOP RECORDER INSERTION;  Surgeon: Evans Lance, MD;  Location: [facility];  Service: Cardiovascular;  Laterality: N/A;  . No prior surgery    . TEE WITHOUT CARDIOVERSION N/A 07/30/2019   Procedure: TRANSESOPHAGEAL ECHOCARDIOGRAM (TEE);  Surgeon: Donato Heinz, MD;  Location: [facility] ENDOSCOPY;  Service: Cardiovascular;  Laterality: N/A;    There were no vitals filed for this visit.  Subjective Assessment - 10/29/19 1708    Subjective  Been doing okay, still have some of that numbness/achiness in L UE and hands that wake me up at night.  At night, pain goes up to an 8.  Had headache on Friday and Saturday.    Patient Stated Goals  Pt's goals for therapy are to strengthen to  help with the tired feeling.    Currently in Pain?  Yes    Pain Score  2     Pain Location  Hand   arm   Pain Orientation  Left    Pain Descriptors / Indicators  Aching;Numbness    Pain Type  Chronic pain    Pain Radiating Towards  elbow to hand    Pain Onset  1 to 4 weeks ago    Aggravating Factors   overuse    Pain Relieving Factors  rest                        [facility] Adult PT Treatment/Exercise - 10/29/19 1715      Ambulation/Gait   Ambulation/Gait  Yes    Ambulation/Gait Assistance  5: Supervision    Ambulation/Gait Assistance Details  Utilized bilateral walking poles x 2 laps in gym, to facilitate improved reciprocal arm swing.  Provided cues for patient to focus on step length and allow PT to facilitate arm swing.  Then removed walking poles x 230 ft, with continue cues for relaxed arm swing and step length.    Ambulation Distance (Feet)  230 Feet   x 2; 40 ft x 2   Assistive device  None    Gait Pattern  Step-through pattern;Decreased  arm swing - right;Decreased arm swing - left;Decreased step length - left;Decreased step length - right;Decreased stride length;Decreased trunk rotation;Wide base of support    Ambulation Surface  Level;Indoor         Neuro Re-education: Reviewed the following BOLD exercises in pt's HEP; remaining exercises-pt verbally reviews and reports doing.   Access Code: [email or web address] URL: [email or web address] Date: 10/29/2019 Prepared by: Mady Haagensen  Sit to Stand - 1-2 x daily - 5 x weekly - 3 sets - 5 reps  Standing Marching - 1-2 x daily - 5 x weekly - 3 sets  Standing Balance with Eyes Closed - 2 x daily - 5 x weekly - 3 sets - 15 hold Tandem Stance - 1 x daily - 5 x weekly - 3 sets - 20 hold Seated Nose to Left Knee Vestibular Habituation - 2 x daily - 5 x weekly - 1 sets - 3-4 reps-MODIFIED to nose to left knee forward trunk bending with increased speed, repeated 3-4 times forward bend to knee, then return to sit  tall quickly, then stay upright for symptoms to settle Seated Nose to Right Knee Vestibular Habituation - 2 x daily - 5 x weekly - 1 sets - 3-4 reps-MODIFIED to nose to left knee forward trunk bending with increased speed, repeated 3-4 times forward bend to knee, then return to sit tall quickly, then stay upright for symptoms to settle Vestibular Habituation - Seated Horizontal Head Rotation - 1 x daily - 5 x weekly - 3 sets - 5 reps-Performed in standing (2 sets, 2nd set with increased speed) Seated Head Nods Vestibular Habituation - 1 x daily - 5 x weekly - 3 sets - 5 reps-Performed in standing (2 sets, 2nd set with increased speed) Lateral Weight Shift with Arm Raise - 1 x daily - 5 x weekly - 1 sets - 10 reps Standing Quarter Turn with Counter Support - 1 x daily - 5 x weekly - 1 sets - 5 reps Standing Gaze Stabilization with Head Rotation - 1-2 x daily - 5 x weekly - 3 sets - 10 reps   Balance Exercises - 10/29/19 1732      Balance Exercises: Standing   Retro Gait  Upper extremity support;3 reps   Fwd/Back along counter   Sidestepping  2 reps;Upper extremity support   Sidestep R>Sidestep L, repeat 10 reps    Other Standing Exercises  Progressed to sidestepping with head turns, R and L along counter with UE support     Other Standing Exercises Comments  Progressed to forward walking along counter, x 3 laps with head turns, slowed pace with turning.     Also performed stagger stance forward/back rocking at counter, 10 reps each foot position  With all exercises, pt rates dizziness as 1-2/10 (even though at times, she has to close eyes for symptoms to settle); slowed transitions with turns and between exercises.   PT Education - 10/30/19 1552    Education Details  Reviewed HEP and made modifications-see instructions; continued to reinforce MOVEMENT, faster, bigger motions, more movement in normal movement patterns throughout the day    Person(s) Educated  Patient    Methods   Explanation;Demonstration;Verbal cues;Handout    Comprehension  Verbalized understanding;Returned demonstration       PT Short Term Goals - 09/10/19 0944      PT SHORT TERM GOAL #1   Title  Pt will be independent with HEP for improved strength, balance, and gait.  TARGET for all STGs:  4  weeks:  09/05/2019    Time  4    Period  Weeks    Status  Achieved      PT SHORT TERM GOAL #2   Title  Pt will improve 5x sit<>stand to less than or equal to 15 seconds for improved functional lower extremity strength.    Baseline  21.68 seconds on 09/03/19    Time  4    Period  Weeks    Status  Not Met      PT SHORT TERM GOAL #3   Title  Pt will improve DGI score to at least 16/24 for decresaed fall risk.    Baseline  10/24 on 09/10/19    Time  4    Period  Weeks    Status  Not Met      PT SHORT TERM GOAL #4   Title  Pt will verbalize understanding of:  fall prevention in home environment and CVA warning signs/symptoms    Time  4    Period  Weeks    Status  Achieved        PT Long Term Goals - 10/20/19 1258      PT LONG TERM GOAL #1   Title  Pt will be independent with progression of HEP to address strength, balance, gait, and vestibular impairments.  TARGET 11/26/2019 for all LTGs    Baseline  pt will continue to benefit from further revisions/additions to HEP    Time  5    Period  Weeks    Status  On-going      PT LONG TERM GOAL #2   Title  Pt perform sit>stand in less than 5 seconds for improved transfers and initiation of gait.    Baseline  38.9 seconds with no UE support from standard chair, 39.97 sec 10/20/2019; 1st sit<>stand takes >9 seconds    Time  5    Period  Weeks    Status  Revised      PT LONG TERM GOAL #3   Title  Pt will improve DGI score to at least 15/24 for decreased fall risk and improved functional mobility.    Baseline  9/24 on 09/25/19; 12/24 10/20/2019    Time  5    Period  Weeks    Status  Revised      PT LONG TERM GOAL #4   Title  Pt will ambulate at  least 500 ft, indoor and outdoor surfaces, with supervision and no LOB for return to independent community gait.    Baseline  230 ft indoors 10/20/2019    Time  5    Period  Weeks    Status  On-going      PT LONG TERM GOAL #5   Title  Pt will improve gait speed to at least 2 ft/sec for improved gait efficiency and decr fall risk.    Baseline  1.57 ft/sec on 09/25/19; 1.75 ft/sec    Time  5    Period  Weeks    Status  Revised            Plan - 10/30/19 1556    Clinical Impression Statement  Pt performed and/or verbalized performance of exercises for her HEP.  Modified seated forward trunk bending to allow for increased speed and repetition of movement; pt noted to have slightly improved speed of head motions with static standing exercises.  Worked on dynamic walking along counter with added head turns, with overall slowed pace.  Pt continues with slowed,  guarded pace with gait and will continue to benefit from further skilled PT to address progression of balance and gait activities.    Personal Factors and Comorbidities  Comorbidity 3+    Comorbidities  PMH:  asthma, hyperlipidemia, spondyloslisthesis, R RTC tear    Examination-Activity Limitations  Locomotion Level;Transfers;Stand;Stairs    Examination-Participation Restrictions  Community Activity;Yard Work;Other   walking for exercise in neighborhood, return to work   Stability/Clinical Decision Making  Evolving/Moderate complexity    Rehab Potential  Good    PT Frequency  1x / week    PT Duration  Other (comment)   5 more weeks, per recert 7/49/4496   PT Treatment/Interventions  ADLs/Self Care Home Management;Gait training;Stair training;Functional mobility training;Therapeutic activities;Therapeutic exercise;Balance training;Neuromuscular re-education;DME Instruction;Patient/family education;Electrical Stimulation;Orthotic Fit/Training;Vestibular    PT Next Visit Plan  NuStep for movement.  Gait along counter-forward/back, forward  with head turns; work on increasing speed and excursion of movements; corner balance, corner trunk rotation; progress towards LTGs    Consulted and Agree with Plan of Care  Patient       Patient will benefit from skilled therapeutic intervention in order to improve the following deficits and impairments:  Abnormal gait, Difficulty walking, Decreased endurance, Decreased balance, Decreased activity tolerance, Decreased strength, Decreased mobility  Visit Diagnosis: Other abnormalities of gait and mobility  Unsteadiness on feet  Dizziness and giddiness     Problem List Patient Active Problem List   Diagnosis Date Noted  . CVA (cerebral vascular accident) ([place]) 07/29/2019  . Dysphagia 07/29/2019  . Left hand weakness 07/29/2019  . Palpitations 09/15/2015  . Diastolic dysfunction 75/91/6384  . Renal cyst, left 06/19/2013  . History of migraine headaches 03/05/2013  . Complicated migraine 66/59/9357  . HLD (hyperlipidemia) 03/05/2013  . 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 Rotator cuff syndrome 03/05/2013  . Low back pain 03/05/2013    Soila Printup W. 10/30/2019, 4:00 PM Frazier Butt., PT  [facility] [address] [place], [place], [address] Phone: [phone]   Fax:  [phone]  Name: Tara [name] MRN: [id number] Date of Birth: 1965-06-13

## 2019-10-31 DIAGNOSIS — I6930 Unspecified sequelae of cerebral infarction: Secondary | ICD-10-CM | POA: Diagnosis not present

## 2019-10-31 DIAGNOSIS — E7800 Pure hypercholesterolemia, unspecified: Secondary | ICD-10-CM | POA: Diagnosis not present

## 2019-10-31 DIAGNOSIS — R5383 Other fatigue: Secondary | ICD-10-CM | POA: Diagnosis not present

## 2019-11-05 ENCOUNTER — Ambulatory Visit: Payer: 59 | Admitting: Occupational Therapy

## 2019-11-05 ENCOUNTER — Ambulatory Visit: Payer: 59 | Admitting: Physical Therapy

## 2019-11-05 ENCOUNTER — Other Ambulatory Visit: Payer: Self-pay

## 2019-11-05 ENCOUNTER — Ambulatory Visit: Payer: 59

## 2019-11-05 DIAGNOSIS — R471 Dysarthria and anarthria: Secondary | ICD-10-CM | POA: Diagnosis not present

## 2019-11-05 DIAGNOSIS — R41841 Cognitive communication deficit: Secondary | ICD-10-CM | POA: Diagnosis not present

## 2019-11-05 DIAGNOSIS — R2689 Other abnormalities of gait and mobility: Secondary | ICD-10-CM | POA: Diagnosis not present

## 2019-11-05 DIAGNOSIS — R278 Other lack of coordination: Secondary | ICD-10-CM | POA: Diagnosis not present

## 2019-11-05 DIAGNOSIS — R2681 Unsteadiness on feet: Secondary | ICD-10-CM

## 2019-11-05 DIAGNOSIS — I69318 Other symptoms and signs involving cognitive functions following cerebral infarction: Secondary | ICD-10-CM

## 2019-11-05 DIAGNOSIS — M6281 Muscle weakness (generalized): Secondary | ICD-10-CM | POA: Diagnosis not present

## 2019-11-05 DIAGNOSIS — R42 Dizziness and giddiness: Secondary | ICD-10-CM | POA: Diagnosis not present

## 2019-11-05 NOTE — Therapy (Signed)
[facility] [address] [place], [place], [address] Phone: [phone]   Fax:  [phone]  Occupational Therapy Treatment  Patient Details  Name: Tara [name] MRN: [id number] Date of Birth: Jan 14, 1966 No data recorded  Encounter Date: 11/05/2019  OT End of Session - 11/05/19 1705    Visit Number  15    Number of Visits  28    Date for OT Re-Evaluation  12/14/19    Authorization Type  [facility]    Authorization Time Period  week 3/8    OT Start Time  1623    OT Stop Time  1705    OT Time Calculation (min)  42 min    Activity Tolerance  Patient tolerated treatment well    Behavior During Therapy  [facility] for tasks assessed/performed       Past Medical History:  Diagnosis Date  . Asthma   . Hyperlipidemia   . Migraine   . Rotator cuff tear   . Spondylolisthesis    L4-5    Past Surgical History:  Procedure Laterality Date  . BUBBLE STUDY  07/30/2019   Procedure: BUBBLE STUDY;  Surgeon: [name], MD;  Location: [facility] ENDOSCOPY;  Service: Cardiovascular;;  . LOOP RECORDER INSERTION N/A 07/30/2019   Procedure: LOOP RECORDER INSERTION;  Surgeon: [name], MD;  Location: [facility] INVASIVE CV LAB;  Service: Cardiovascular;  Laterality: N/A;  . No prior surgery    . TEE WITHOUT CARDIOVERSION N/A 07/30/2019   Procedure: TRANSESOPHAGEAL ECHOCARDIOGRAM (TEE);  Surgeon: [name], MD;  Location: [facility] ENDOSCOPY;  Service: Cardiovascular;  Laterality: N/A;    There were no vitals filed for this visit.  Subjective Assessment - 11/05/19 1623    Currently in Pain?  Yes    Pain Score  2     Pain Location  Hand    Pain Orientation  Left    Pain Descriptors / Indicators  Aching;Burning    Pain Type  Chronic pain    Pain Onset  More than a month ago    Pain Frequency  Intermittent    Aggravating Factors   use    Pain Relieving Factors  rest              Treatment: Typing activities for  increased visual input and typing spped, pt with improving performance. Ambulating while performing enviromental scanning to locate items in sequential order, 3 rest breaks required due to dizziness, increased time required, no LOB. Pt reports dizziness max of 2/10 Arm bike x 5 mins level 3 conditioning               OT Short Term Goals - 10/07/19 1408      OT SHORT TERM GOAL #1   Title  Independent with HEP for LUE strengthening (shoulder and hand)    Time  3    Period  Weeks    Status  Achieved      OT SHORT TERM GOAL #2   Title  Grip strength Lt hand to be 45 lbs or greater    Baseline  35 lbs (Rt = 56)    Time  3    Period  Weeks    Status  On-going   42.5, 46- inconsistent     OT SHORT TERM GOAL #3   Title  Pt will increase typing speed to 20 wpm, 90% accuracy in prep for return to work    Time  3  Period  Weeks    Status  On-going   18 wpm     OT SHORT TERM GOAL #4   Title  Pt to perform environmental scanning w/ head turns and no LOB, min dizziness w/ 90% accuracy    Time  3    Period  Weeks    Status  On-going        OT Long Term Goals - 10/16/19 1718      OT LONG TERM GOAL #1   Title  Pt to report less drops Lt hand t/o day    Time  6    Period  Weeks    Status  On-going   1-2 drops/day     OT LONG TERM GOAL #2   Title  Pt to perform cleaning tasks for 20 min w/o rest mod I level    Time  6    Period  Weeks    Status  On-going   Pt reports cleaning vacuuming 1-2 rooms but then has to rest; pt requires several rest breaks     OT LONG TERM GOAL #3   Title  Pt to perform full cooking tasks mod I level safely    Time  6    Period  Weeks    Status  On-going   not consistently, Pt reports heating somethin up, simple stovetop meal (eggs)     OT LONG TERM GOAL #4   Title  Pt to simulate work tasks w/ extra time as required    Time  6    Period  Weeks    Status  On-going            Plan - 11/05/19 1625    Clinical Impression  Statement  Pt is progressing towards goals with improving environmental scanning when amb with walking stick.    Occupational performance deficits (Please refer to evaluation for details):  IADL's;Work;Leisure    Body Structure / Function / Physical Skills  ADL;IADL;Strength;Coordination;FMC;UE functional use;Vision    Cognitive Skills  Perception;Problem Solve;Attention    Rehab Potential  Good    OT Frequency  2x / week    OT Duration  8 weeks    OT Treatment/Interventions  Self-care/ADL training;Therapeutic exercise;Functional Mobility Training;Neuromuscular education;Manual Therapy;Therapeutic activities;Coping strategies training;DME and/or AE instruction;Cognitive remediation/compensation;Visual/perceptual remediation/compensation;Moist Heat;Passive range of motion;Patient/family education    Plan  continue progress towards goals    Consulted and Agree with Plan of Care  Patient       Patient will benefit from skilled therapeutic intervention in order to improve the following deficits and impairments:   Body Structure / Function / Physical Skills: ADL, IADL, Strength, Coordination, FMC, UE functional use, Vision Cognitive Skills: Perception, Problem Solve, Attention     Visit Diagnosis: Other lack of coordination  Other symptoms and signs involving cognitive functions following cerebral infarction  Muscle weakness (generalized)  Unsteadiness on feet  Other abnormalities of gait and mobility    Problem List Patient Active Problem List   Diagnosis Date Noted  . CVA (cerebral vascular accident) ([facility]) 07/29/2019  . Dysphagia 07/29/2019  . Left hand weakness 07/29/2019  . Palpitations 09/15/2015  . Diastolic dysfunction 09/15/2015  . Renal cyst, left 06/19/2013  . History of migraine headaches 03/05/2013  . 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 HLD (hyperlipidemia) 03/05/2013  . 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 Rotator cuff syndrome 03/05/2013  . Low back pain 03/05/2013     Kyran Whittier 11/05/2019, 5:15 PM  [facility] [address]  [place], [place], [address] Phone: [phone]   Fax:  [phone]  Name: Tara [name] MRN: [id number] Date of Birth: 1965/09/12

## 2019-11-05 NOTE — Therapy (Signed)
[facility] [address] [place], [place], [address] Phone: [phone]   Fax:  [phone]  Speech Language Pathology Treatment  Patient Details  Name: Tara [name] MRN: [id number] Date of Birth: 11/22/1965 Referring Provider (SLP): Frann Rider NP   Encounter Date: 11/05/2019  End of Session - 11/05/19 1640    Visit Number  8    Number of Visits  17    Date for SLP Re-Evaluation  11/26/19    SLP Start Time  1532    SLP Stop Time   6378    SLP Time Calculation (min)  43 min    Activity Tolerance  Patient tolerated treatment well       Past Medical History:  Diagnosis Date  . Asthma   . Hyperlipidemia   . Migraine   . Rotator cuff tear   . Spondylolisthesis    L4-5    Past Surgical History:  Procedure Laterality Date  . BUBBLE STUDY  07/30/2019   Procedure: BUBBLE STUDY;  Surgeon: Donato Heinz, MD;  Location: [facility];  Service: Cardiovascular;;  . LOOP RECORDER INSERTION N/A 07/30/2019   Procedure: LOOP RECORDER INSERTION;  Surgeon: Evans Lance, MD;  Location: [facility];  Service: Cardiovascular;  Laterality: N/A;  . No prior surgery    . TEE WITHOUT CARDIOVERSION N/A 07/30/2019   Procedure: TRANSESOPHAGEAL ECHOCARDIOGRAM (TEE);  Surgeon: Donato Heinz, MD;  Location: [facility] ENDOSCOPY;  Service: Cardiovascular;  Laterality: N/A;    There were no vitals filed for this visit.  Subjective Assessment - 11/05/19 1541    Subjective  "He said since I have three therapies, he said to conentrate on those and he'll see me again in July."    Currently in Pain?  Yes    Pain Score  2     Pain Location  Hand    Pain Orientation  Left    Pain Descriptors / Indicators  Aching;Burning    Pain Type  Acute pain    Pain Onset  1 to 4 weeks ago    Pain Frequency  Intermittent            ADULT SLP TREATMENT - 11/05/19 1542      General Information   Behavior/Cognition   Alert;Cooperative;Pleasant mood      Treatment Provided   Treatment provided  Cognitive-Linquistic      Cognitive-Linquistic Treatment   Treatment focused on  Cognition    Skilled Treatment  "I try not to totally depend on Alexa now." Today I just did moving clothes to the dryer, and calling to follow up with my bills."  Pt did both of those things and was then able to fold clothes. As pt passed her unmade bed she made it and then went immediately back to folding clothes. Pt thinks of "only adding one" and then returning to her original task. Pt told SLP any more things and she may forget to return to her original task (anticipatory awareness). Pt learned this from an experience on Monday when she got sidetracked. SLP congratulated pt on "one extra thing" technique. SLP had pt shout "hey" at average low 80s dB. "I used to have a loud voice."       Assessment / Recommendations / Plan   Plan  Continue with current plan of care      Progression Toward Goals   Progression toward goals  Progressing toward goals         SLP  Short Term Goals - 10/29/19 1706      SLP SHORT TERM GOAL #1   Title  Pt will utiltize external aids to schedule and complete daily tasks with rare min A over 3 sessions    Baseline  10-28-19, 10-29-19    Status  Partially Met      SLP SHORT TERM GOAL #2   Title  Pt will uitlize compensatory strategies to attend to and process verbal instructions and converstion accurately with rare min A over 2 sessions    Baseline  10-28-19, 10-29-19    Status  On-going      SLP SHORT TERM GOAL #3   Title  Pt will read 1 paragraph (5-7 sentences) and recall details with rare min A over 2 sessions    Baseline  10-28-19    Status  Achieved       SLP Long Term Goals - 11/05/19 1650      SLP LONG TERM GOAL #1   Title  Pt will utilize compensations for attention to prepare a main dish for 3 meals with rare min A    Baseline  10-28-19, 10-29-19, 11-05-19    Time  4    Status  Achieved      SLP  LONG TERM GOAL #2   Title  Pt will carryover compensatory strategies for attention and processing to run 3 errands in a row successfully with rare min A    Time  4    Period  Weeks    Status  On-going      SLP LONG TERM GOAL #3   Title  Pt will return to ADL/IADL task after a distraction and succesfully complete task before starting a new activity with rare min A over 3 sessions    Baseline  11-05-19    Time  4    Period  Weeks    Status  On-going       Plan - 11/05/19 1647    Clinical Impression Statement  Tara [name] continues to present with at least mild cognitive linguistic impairments of attention, processing, and visuospatial skills. Pt reports using self-regulated attention compenstion successfully in the past few days, and continues to demo improved alternating atteniton at home. She again wrote notes adequate for recall of facts in 2 1/2 minutes of auditory information with independence. Pt voice loudness improving, but loudest HEY! pt could produce was in low 80s dB. SLP to add voice/dysarthria dx to pt's plan next session. Ongoing training of compensations for cogntive impairments for successful completion of IADL's, intelligiblilty. Continue skilled ST    Speech Therapy Frequency  2x / week    Duration  --   8 weeks or 17 visits   Treatment/Interventions  Language facilitation;Environmental controls;Cueing hierarchy;SLP instruction and feedback;Compensatory strategies;Functional tasks;Cognitive reorganization;Patient/family education;Multimodal communcation approach;Internal/external aids;Compensatory techniques    Potential to Achieve Goals  Good       Patient will benefit from skilled therapeutic intervention in order to improve the following deficits and impairments:   Cognitive communication deficit    Problem List Patient Active Problem List   Diagnosis Date Noted  . CVA (cerebral vascular accident) ([facility]) 07/29/2019  . Dysphagia 07/29/2019  . Left hand weakness  07/29/2019  . Palpitations 09/15/2015  . Diastolic dysfunction 98/92/1194  . Renal cyst, left 06/19/2013  . History of migraine headaches 03/05/2013  . Complicated migraine 17/40/8144  . HLD (hyperlipidemia) 03/05/2013  . 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 Rotator cuff syndrome 03/05/2013  . Low back pain 03/05/2013  [facility] ,[place], [place]  11/05/2019, 4:53 PM  [facility] [address] [place], [place], [address] Phone: [phone]   Fax:  [phone]   Name: Tara [name] MRN: [id number] Date of Birth: [birth year]

## 2019-11-05 NOTE — Patient Instructions (Signed)
Trekking poles or walking poles-  You can find them at [place], or probably [email or web address]  I had yours set to a height of 37 inches.

## 2019-11-06 NOTE — Therapy (Signed)
[facility] [address] [place], [place], [address] Phone: [phone]   Fax:  [phone]  Physical Therapy Treatment  Patient Details  Name: Tara [name] MRN: [id number] Date of Birth: 1965/11/24 No data recorded  Encounter Date: 11/05/2019   PT End of Session - 11/06/19 0733    Visit Number 19    Number of Visits 22    Authorization Type [facility]    PT Start Time 1446    PT Stop Time 1530    PT Time Calculation (min) 44 min    Equipment Utilized During Treatment Gait belt    Activity Tolerance Patient tolerated treatment well   limited/slowed by dizziness; 1-2/10 at times, causes pt to stop, close eyes and rest from activity   Behavior During Therapy [facility] for tasks assessed/performed           Past Medical History:  Diagnosis Date  . Asthma   . Hyperlipidemia   . Migraine   . Rotator cuff tear   . Spondylolisthesis    L4-5    Past Surgical History:  Procedure Laterality Date  . BUBBLE STUDY  07/30/2019   Procedure: BUBBLE STUDY;  Surgeon: Donato Heinz, MD;  Location: [facility];  Service: Cardiovascular;;  . LOOP RECORDER INSERTION N/A 07/30/2019   Procedure: LOOP RECORDER INSERTION;  Surgeon: Evans Lance, MD;  Location: [facility];  Service: Cardiovascular;  Laterality: N/A;  . No prior surgery    . TEE WITHOUT CARDIOVERSION N/A 07/30/2019   Procedure: TRANSESOPHAGEAL ECHOCARDIOGRAM (TEE);  Surgeon: Donato Heinz, MD;  Location: [facility] ENDOSCOPY;  Service: Cardiovascular;  Laterality: N/A;    There were no vitals filed for this visit.   Subjective Assessment - 11/05/19 1451    Subjective Still having the pian in L 3rd and 4th fingers and forearm.  Saw the doctor; he said to keep doing what I'm doing.  Otherwise, things about the same.    Patient Stated Goals Pt's goals for therapy are to strengthen to help with the tired feeling.    Currently in Pain? Yes    Pain Score 2      Pain Location Hand    Pain Orientation Left    Pain Descriptors / Indicators Aching;Burning    Pain Type Acute pain    Pain Radiating Towards elbow to hand    Pain Onset 1 to 4 weeks ago    Pain Frequency Intermittent    Aggravating Factors  most of the time    Pain Relieving Factors repositioning                             [facility] Adult PT Treatment/Exercise - 11/05/19 1457      Ambulation/Gait   Ambulation/Gait Yes    Ambulation/Gait Assistance 5: Supervision    Ambulation/Gait Assistance Details Initial gait with no device, then trialed single walking pole/cane.  Pt reports feeling and appears steadier with use of additional single point of stability.  Initial cues provided for cane/walking pole sequence.  Pt reprots liking the walking pole better.    Ambulation Distance (Feet) 80 Feet   x 2; 115 ft no device, 230 ft cane/230, 115 ft walking pole   Assistive device Straight cane;Other (Comment)   Single walking pole   Gait Pattern Step-through pattern;Decreased arm swing - right;Decreased step length - right;Decreased stride length;Decreased trunk rotation;Wide base of support   With use of  walking pole/cane, improved L arm swing   Ambulation Surface Level;Indoor    Pre-Gait Activities Worked on figure-8 turns around cones x 2 reps (timed at 32.41, 33 seconds) with no device.  Additional 2 reps of figure-8 turns/changes of direction with walking pole 35-36 seconds but improved step length and improved head motions with changes of directions.    Gait Comments With use of single walking pole, pt noted to have improved overall step length, improved LUE arm swing, improved fluid head motion with curves in gym.  Pt does not appear as guarded, and pt reports feeling more balanced with the additional point of stability.  Discussed cane versus walking pole.  Pt seems to prefer walking pole; provided info on how to obtain walking pole.      Knee/Hip Exercises: Aerobic    Stepper SciFit, Seated Stepper, Level, 1.5, 4 extremities x 5 minutes, RPM 31-35, to encourage increased activity tolerance and lower extremity flexibility and strength.            Neuro Re-education:   Reviewed HEP (verbally given) last visit, with pt return demo understanding: At counter:  side stepping along counter R and L x 2 reps, then with added head turns x 1 rep Forward/back walking along counter x 2 reps, then forward walking along counter with head turns x 2 reps.  Progressed to forward walking along counter x 2 reps with head nods.  Pt performs with slowed motion, especially with head nods.  Pt reports dizziness 1-2/10.      PT Education - 11/06/19 0733    Education Details Use of walking poles to help with improved ease of motion with gait; how to obtain/order walking poles    Person(s) Educated Patient    Methods Explanation;Handout    Comprehension Verbalized understanding            PT Short Term Goals - 09/10/19 0944      PT SHORT TERM GOAL #1   Title Pt will be independent with HEP for improved strength, balance, and gait.  TARGET for all STGs:  4 weeks:  09/05/2019    Time 4    Period Weeks    Status Achieved      PT SHORT TERM GOAL #2   Title Pt will improve 5x sit<>stand to less than or equal to 15 seconds for improved functional lower extremity strength.    Baseline 21.68 seconds on 09/03/19    Time 4    Period Weeks    Status Not Met      PT SHORT TERM GOAL #3   Title Pt will improve DGI score to at least 16/24 for decresaed fall risk.    Baseline 10/24 on 09/10/19    Time 4    Period Weeks    Status Not Met      PT SHORT TERM GOAL #4   Title Pt will verbalize understanding of:  fall prevention in home environment and CVA warning signs/symptoms    Time 4    Period Weeks    Status Achieved             PT Long Term Goals - 10/20/19 1258      PT LONG TERM GOAL #1   Title Pt will be independent with progression of HEP to address strength,  balance, gait, and vestibular impairments.  TARGET 11/26/2019 for all LTGs    Baseline pt will continue to benefit from further revisions/additions to HEP    Time 5  Period Weeks    Status On-going      PT LONG TERM GOAL #2   Title Pt perform sit>stand in less than 5 seconds for improved transfers and initiation of gait.    Baseline 38.9 seconds with no UE support from standard chair, 39.97 sec 10/20/2019; 1st sit<>stand takes >9 seconds    Time 5    Period Weeks    Status Revised      PT LONG TERM GOAL #3   Title Pt will improve DGI score to at least 15/24 for decreased fall risk and improved functional mobility.    Baseline 9/24 on 09/25/19; 12/24 10/20/2019    Time 5    Period Weeks    Status Revised      PT LONG TERM GOAL #4   Title Pt will ambulate at least 500 ft, indoor and outdoor surfaces, with supervision and no LOB for return to independent community gait.    Baseline 230 ft indoors 10/20/2019    Time 5    Period Weeks    Status On-going      PT LONG TERM GOAL #5   Title Pt will improve gait speed to at least 2 ft/sec for improved gait efficiency and decr fall risk.    Baseline 1.57 ft/sec on 09/25/19; 1.75 ft/sec    Time 5    Period Weeks    Status Revised                 Plan - 11/06/19 0734    Clinical Impression Statement Reviewed updates to HEP last week for dynamic walking at counter at home with added head turns and pt return demo understanding.  She appears to have more hesitancy with head nods that head turns when head motions added in.  With gait activities today, pt trialed cane and walking pole to add additional point of stability for gait.  Pt appears to have improved ease and fluidity of motion with use of walking pole/cane; she reports feeling steadier and prefers walking pole.  Will likely benefit from additional gait training practice with use of walking pole for improved overall functional mobility and balance.    Personal Factors and  Comorbidities Comorbidity 3+    Comorbidities PMH:  asthma, hyperlipidemia, spondyloslisthesis, R RTC tear    Examination-Activity Limitations Locomotion Level;Transfers;Stand;Stairs    Examination-Participation Restrictions Community Activity;Yard Work;Other   walking for exercise in neighborhood, return to work   Stability/Clinical Decision Making Evolving/Moderate complexity    Rehab Potential Good    PT Frequency 1x / week    PT Duration Other (comment)   5 more weeks, per recert 2/84/1324   PT Treatment/Interventions ADLs/Self Care Home Management;Gait training;Stair training;Functional mobility training;Therapeutic activities;Therapeutic exercise;Balance training;Neuromuscular re-education;DME Instruction;Patient/family education;Electrical Stimulation;Orthotic Fit/Training;Vestibular    PT Next Visit Plan NuStep/SciFit for movement.  Gait along counter-forward/back, forward with head turns (try on compliant surface); gait training with single walking pole; work on increasing speed and excursion of movements; corner balance, corner trunk rotation; progress towards LTGs    Consulted and Agree with Plan of Care Patient           Patient will benefit from skilled therapeutic intervention in order to improve the following deficits and impairments:  Abnormal gait, Difficulty walking, Decreased endurance, Decreased balance, Decreased activity tolerance, Decreased strength, Decreased mobility  Visit Diagnosis: Unsteadiness on feet  Other abnormalities of gait and mobility     Problem List Patient Active Problem List   Diagnosis Date Noted  . CVA (  cerebral vascular accident) ([place]) 07/29/2019  . Dysphagia 07/29/2019  . Left hand weakness 07/29/2019  . Palpitations 09/15/2015  . Diastolic dysfunction 16/96/7893  . Renal cyst, left 06/19/2013  . History of migraine headaches 03/05/2013  . Complicated migraine 81/05/7508  . HLD (hyperlipidemia) 03/05/2013  . Asthmatic bronchitis  03/05/2013  . Rotator cuff syndrome 03/05/2013  . Low back pain 03/05/2013    Kishia Shackett W. 11/06/2019, 7:39 AM Frazier Butt., PT  [facility] [address] [place], [place], [address] Phone: [phone]   Fax:  [phone]  Name: LIZZETTE CARBONELL MRN: [id number] Date of Birth: 05/12/1966

## 2019-11-07 ENCOUNTER — Ambulatory Visit: Payer: 59 | Admitting: Occupational Therapy

## 2019-11-07 ENCOUNTER — Other Ambulatory Visit: Payer: Self-pay

## 2019-11-07 ENCOUNTER — Ambulatory Visit: Payer: 59

## 2019-11-07 ENCOUNTER — Encounter: Payer: Self-pay | Admitting: Occupational Therapy

## 2019-11-07 DIAGNOSIS — R471 Dysarthria and anarthria: Secondary | ICD-10-CM | POA: Diagnosis not present

## 2019-11-07 DIAGNOSIS — M6281 Muscle weakness (generalized): Secondary | ICD-10-CM | POA: Diagnosis not present

## 2019-11-07 DIAGNOSIS — R2689 Other abnormalities of gait and mobility: Secondary | ICD-10-CM | POA: Diagnosis not present

## 2019-11-07 DIAGNOSIS — R278 Other lack of coordination: Secondary | ICD-10-CM | POA: Diagnosis not present

## 2019-11-07 DIAGNOSIS — R41841 Cognitive communication deficit: Secondary | ICD-10-CM | POA: Diagnosis not present

## 2019-11-07 DIAGNOSIS — I69318 Other symptoms and signs involving cognitive functions following cerebral infarction: Secondary | ICD-10-CM

## 2019-11-07 DIAGNOSIS — R2681 Unsteadiness on feet: Secondary | ICD-10-CM | POA: Diagnosis not present

## 2019-11-07 DIAGNOSIS — R42 Dizziness and giddiness: Secondary | ICD-10-CM | POA: Diagnosis not present

## 2019-11-07 NOTE — Therapy (Signed)
[facility] [address] [place], [place], [address] Phone: [phone]   Fax:  [phone]  Occupational Therapy Treatment  Patient Details  Name: Tara [name] MRN: [id number] Date of Birth: Oct 07, 1965 No data recorded  Encounter Date: 11/07/2019   OT End of Session - 11/07/19 1151    Visit Number 16    Number of Visits 28    Date for OT Re-Evaluation 12/14/19    Authorization Type [facility]    Authorization Time Period week 3/8    OT Start Time 1147    OT Stop Time 1227    OT Time Calculation (min) 40 min    Activity Tolerance Patient tolerated treatment well    Behavior During Therapy [facility] for tasks assessed/performed           Past Medical History:  Diagnosis Date  . Asthma   . Hyperlipidemia   . Migraine   . Rotator cuff tear   . Spondylolisthesis    L4-5    Past Surgical History:  Procedure Laterality Date  . BUBBLE STUDY  07/30/2019   Procedure: BUBBLE STUDY;  Surgeon: Donato Heinz, MD;  Location: [place];  Service: Cardiovascular;;  . LOOP RECORDER INSERTION N/A 07/30/2019   Procedure: LOOP RECORDER INSERTION;  Surgeon: Evans Lance, MD;  Location: [place] [facility];  Service: Cardiovascular;  Laterality: N/A;  . No prior surgery    . TEE WITHOUT CARDIOVERSION N/A 07/30/2019   Procedure: TRANSESOPHAGEAL ECHOCARDIOGRAM (TEE);  Surgeon: Donato Heinz, MD;  Location: [facility] ENDOSCOPY;  Service: Cardiovascular;  Laterality: N/A;    There were no vitals filed for this visit.   Subjective Assessment - 11/07/19 1148    Subjective  Pt is making a to do list with Alexa at home    Currently in Pain? Yes    Pain Score 3     Pain Location Hand    Pain Orientation Left    Pain Descriptors / Indicators Aching;Burning    Pain Type Acute pain    Pain Onset More than a month ago    Pain Frequency Intermittent    Aggravating Factors  malpositioning    Pain Relieving Factors rest                Treatment:Standing to copy small peg design for dynamic balance and LUE fine motor coordination, min difficulty and pt dizziness went to 3/10. Typing activity word tris for increased speed and accuracy, pt tolerates the visual input better now and demonstrates improving accuracy. Arm bike x 5 mins level 1 for conditioning.                   OT Short Term Goals - 10/07/19 1408      OT SHORT TERM GOAL #1   Title Independent with HEP for LUE strengthening (shoulder and hand)    Time 3    Period Weeks    Status Achieved      OT SHORT TERM GOAL #2   Title Grip strength Lt hand to be 45 lbs or greater    Baseline 35 lbs (Rt = 56)    Time 3    Period Weeks    Status On-going   42.5, 46- inconsistent     OT SHORT TERM GOAL #3   Title Pt will increase typing speed to 20 wpm, 90% accuracy in prep for return to work    Time 3    Period Weeks  Status On-going   18 wpm     OT SHORT TERM GOAL #4   Title Pt to perform environmental scanning w/ head turns and no LOB, min dizziness w/ 90% accuracy    Time 3    Period Weeks    Status On-going             OT Long Term Goals - 10/16/19 1718      OT LONG TERM GOAL #1   Title Pt to report less drops Lt hand t/o day    Time 6    Period Weeks    Status On-going   1-2 drops/day     OT LONG TERM GOAL #2   Title Pt to perform cleaning tasks for 20 min w/o rest mod I level    Time 6    Period Weeks    Status On-going   Pt reports cleaning vacuuming 1-2 rooms but then has to rest; pt requires several rest breaks     OT LONG TERM GOAL #3   Title Pt to perform full cooking tasks mod I level safely    Time 6    Period Weeks    Status On-going   not consistently, Pt reports heating somethin up, simple stovetop meal (eggs)     OT LONG TERM GOAL #4   Title Pt to simulate work tasks w/ extra time as required    Time 6    Period Weeks    Status On-going                 Plan - 11/07/19 1153     Clinical Impression Statement Pt continues to progress towards goals. she demonstrates improved typing speed and improved tolerance of visual activities on computer.    Occupational performance deficits (Please refer to evaluation for details): IADL's;Work;Leisure    Body Structure / Function / Physical Skills ADL;IADL;Strength;Coordination;FMC;UE functional use;Vision    Cognitive Skills Perception;Problem Solve;Attention    Rehab Potential Good    OT Frequency 2x / week    OT Duration 8 weeks    OT Treatment/Interventions Self-care/ADL training;Therapeutic exercise;Functional Mobility Training;Neuromuscular education;Manual Therapy;Therapeutic activities;Coping strategies training;DME and/or AE instruction;Cognitive remediation/compensation;Visual/perceptual remediation/compensation;Moist Heat;Passive range of motion;Patient/family education    Plan continue to address environmental scanning/ dynamic balance    Consulted and Agree with Plan of Care Patient           Patient will benefit from skilled therapeutic intervention in order to improve the following deficits and impairments:   Body Structure / Function / Physical Skills: ADL, IADL, Strength, Coordination, FMC, UE functional use, Vision Cognitive Skills: Perception, Problem Solve, Attention     Visit Diagnosis: Unsteadiness on feet  Other abnormalities of gait and mobility  Other lack of coordination  Other symptoms and signs involving cognitive functions following cerebral infarction  Muscle weakness (generalized)    Problem List Patient Active Problem List   Diagnosis Date Noted  . CVA (cerebral vascular accident) ([facility]) 07/29/2019  . Dysphagia 07/29/2019  . Left hand weakness 07/29/2019  . Palpitations 09/15/2015  . Diastolic dysfunction 09/15/2015  . Renal cyst, left 06/19/2013  . History of migraine headaches 03/05/2013  . 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 HLD (hyperlipidemia) 03/05/2013  . Asthmatic  bronchitis 03/05/2013  . Rotator cuff syndrome 03/05/2013  . Low back pain 03/05/2013    Tara [name] 11/07/2019, 11:54 AM  [facility] [address] [place], [place], [address] Phone: [phone]   Fax:  [phone]  Name:  Tara [name] MRN: [id number] Date of Birth: August 03, 1965

## 2019-11-07 NOTE — Patient Instructions (Signed)
  Increase your number of chores from 2 to 4 and see how it goes with your stamina! It it's too much then back it down to 3 chores/day. OF course you will also always have dinner too.

## 2019-11-07 NOTE — Therapy (Signed)
[facility] [address] [place], [place], [address] Phone: [phone]   Fax:  [phone]  Speech Language Pathology Treatment  Patient Details  Name: Tara [name] MRN: [id number] Date of Birth: 10-Jan-1966 Referring Provider (SLP): Frann Rider NP   Encounter Date: 11/07/2019   End of Session - 11/07/19 1300    Visit Number 9    Number of Visits 17    Date for SLP Re-Evaluation 11/26/19    SLP Start Time 1107   5 minutes late   SLP Stop Time  1146    SLP Time Calculation (min) 39 min    Activity Tolerance Patient tolerated treatment well           Past Medical History:  Diagnosis Date  . Asthma   . Hyperlipidemia   . Migraine   . Rotator cuff tear   . Spondylolisthesis    L4-5    Past Surgical History:  Procedure Laterality Date  . BUBBLE STUDY  07/30/2019   Procedure: BUBBLE STUDY;  Surgeon: Donato Heinz, MD;  Location: [facility];  Service: Cardiovascular;;  . LOOP RECORDER INSERTION N/A 07/30/2019   Procedure: LOOP RECORDER INSERTION;  Surgeon: Evans Lance, MD;  Location: [facility];  Service: Cardiovascular;  Laterality: N/A;  . No prior surgery    . TEE WITHOUT CARDIOVERSION N/A 07/30/2019   Procedure: TRANSESOPHAGEAL ECHOCARDIOGRAM (TEE);  Surgeon: Donato Heinz, MD;  Location: [facility];  Service: Cardiovascular;  Laterality: N/A;    There were no vitals filed for this visit.   Subjective Assessment - 11/07/19 1112    Subjective "This morning I put the dishes away, washed the dishes, and watered teh plants. I still have to iron the clothes."    Currently in Pain? Yes    Pain Score 2     Pain Location Hand    Pain Orientation Left    Pain Descriptors / Indicators Aching;Burning;Numbness    Pain Type Chronic pain                 ADULT SLP TREATMENT - 11/07/19 1117      General Information   Behavior/Cognition Alert;Cooperative;Pleasant mood        Treatment Provided   Treatment provided Cognitive-Linquistic      Cognitive-Linquistic Treatment   Treatment focused on Cognition    Skilled Treatment Pt completed 3 chores in a row prior to ST today (see "S"). Pt and SLP discussed incr'ing her routine number of chores from 2 to 4. Pt reports still needing to take rest breaks - SLP told pt to think about her stamina and modify as necessary maybe back down to 3 chores/day. She is regularly completing 3 chores each day but if she does not take rest breaks she is ready for bed by 7:30PM. Pt showed good/excellent anticipatory awareness re: stamina.       Assessment / Recommendations / Plan   Plan Continue with current plan of care      Progression Toward Goals   Progression toward goals Progressing toward goals              SLP Short Term Goals - 10/29/19 1706      SLP SHORT TERM GOAL #1   Title Pt will utiltize external aids to schedule and complete daily tasks with rare min A over 3 sessions    Baseline 10-28-19, 10-29-19    Status Partially Met      SLP SHORT TERM  GOAL #2   Title Pt will uitlize compensatory strategies to attend to and process verbal instructions and converstion accurately with rare min A over 2 sessions    Baseline 10-28-19, 10-29-19    Status On-going      SLP SHORT TERM GOAL #3   Title Pt will read 1 paragraph (5-7 sentences) and recall details with rare min A over 2 sessions    Baseline 10-28-19    Status Achieved            SLP Long Term Goals - 11/07/19 1302      SLP LONG TERM GOAL #1   Title Pt will utilize compensations for attention to prepare a main dish for 3 meals with rare min A    Baseline 10-28-19, 10-29-19, 11-05-19    Status Achieved      SLP LONG TERM GOAL #2   Title Pt will carryover compensatory strategies for attention and processing to run 3 errands in a row successfully with rare min A    Baseline 11-07-19    Time 4    Period Weeks    Status On-going      SLP LONG TERM GOAL #3   Title  Pt will return to ADL/IADL task after a distraction and succesfully complete task before starting a new activity with rare min A over 3 sessions    Baseline 11-05-19, 11-07-19    Time 4    Period Weeks    Status On-going            Plan - 11/07/19 1300    Clinical Impression Statement Tara [name] continues to present with at least mild but improving cognitive linguistic impairments of attention, processing, and visuospatial skills. See "skilled intervention" for more details. Pt to incr number of scheduled chores each day to 4, and monitoring her stamina level. SLP will add voice/dysarthria dx to pt's plan next session. Ongoing training of compensations for cogntive impairments for successful completion of IADL's, intelligiblilty. Continue skilled ST    Speech Therapy Frequency 2x / week    Duration --   8 weeks or 17 visits   Treatment/Interventions Language facilitation;Environmental controls;Cueing hierarchy;SLP instruction and feedback;Compensatory strategies;Functional tasks;Cognitive reorganization;Patient/family education;Multimodal communcation approach;Internal/external aids;Compensatory techniques    Potential to Achieve Goals Good           Patient will benefit from skilled therapeutic intervention in order to improve the following deficits and impairments:   Cognitive communication deficit    Problem List Patient Active Problem List   Diagnosis Date Noted  . CVA (cerebral vascular accident) ([facility]) 07/29/2019  . Dysphagia 07/29/2019  . Left hand weakness 07/29/2019  . Palpitations 09/15/2015  . Diastolic dysfunction 05/21/8249  . Renal cyst, left 06/19/2013  . History of migraine headaches 03/05/2013  . Complicated migraine 03/70/4888  . HLD (hyperlipidemia) 03/05/2013  . 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 Rotator cuff syndrome 03/05/2013  . Low back pain 03/05/2013    [facility] ,[place], CCC-SLP  11/07/2019, 1:03 PM  [facility] [address] [place], [place], [address] Phone: [phone]   Fax:  [phone]   Name: Tara [name] MRN: [id number] Date of Birth: September 05, 1965

## 2019-11-10 ENCOUNTER — Ambulatory Visit (INDEPENDENT_AMBULATORY_CARE_PROVIDER_SITE_OTHER): Payer: 59 | Admitting: *Deleted

## 2019-11-10 DIAGNOSIS — I639 Cerebral infarction, unspecified: Secondary | ICD-10-CM

## 2019-11-10 LAB — CUP PACEART REMOTE DEVICE CHECK
Date Time Interrogation Session: 20210613230456
Implantable Pulse Generator Implant Date: 20210303

## 2019-11-10 NOTE — Progress Notes (Signed)
[facility] Summary Report / Loop Recorder 

## 2019-11-11 ENCOUNTER — Encounter: Payer: Self-pay | Admitting: Physical Therapy

## 2019-11-11 ENCOUNTER — Other Ambulatory Visit: Payer: Self-pay

## 2019-11-11 ENCOUNTER — Ambulatory Visit: Payer: 59

## 2019-11-11 ENCOUNTER — Ambulatory Visit: Payer: 59 | Admitting: Physical Therapy

## 2019-11-11 ENCOUNTER — Telehealth: Payer: Self-pay | Admitting: Adult Health

## 2019-11-11 DIAGNOSIS — R471 Dysarthria and anarthria: Secondary | ICD-10-CM | POA: Diagnosis not present

## 2019-11-11 DIAGNOSIS — R2681 Unsteadiness on feet: Secondary | ICD-10-CM

## 2019-11-11 DIAGNOSIS — R278 Other lack of coordination: Secondary | ICD-10-CM

## 2019-11-11 DIAGNOSIS — R42 Dizziness and giddiness: Secondary | ICD-10-CM | POA: Diagnosis not present

## 2019-11-11 DIAGNOSIS — M6281 Muscle weakness (generalized): Secondary | ICD-10-CM | POA: Diagnosis not present

## 2019-11-11 DIAGNOSIS — R41841 Cognitive communication deficit: Secondary | ICD-10-CM

## 2019-11-11 DIAGNOSIS — R2689 Other abnormalities of gait and mobility: Secondary | ICD-10-CM | POA: Diagnosis not present

## 2019-11-11 DIAGNOSIS — I69318 Other symptoms and signs involving cognitive functions following cerebral infarction: Secondary | ICD-10-CM

## 2019-11-11 NOTE — Patient Instructions (Signed)
Continue to plan 3-4 tasks a day plus dinner.

## 2019-11-11 NOTE — Telephone Encounter (Signed)
Pt came into the office advise the numbness of middle finger and ring finger persists. It's numbing and burning achy sensation going down to her elbow and wakes her at night. Can you call her to discuss to see what she can do or what is needed. Thank you

## 2019-11-11 NOTE — Therapy (Signed)
[facility] [address] [place], [place], [address] Phone: [phone]   Fax:  [phone]  Speech Language Pathology Treatment  Patient Details  Name: GEARL KIMBROUGH MRN: [id number] Date of Birth: 04/04/1966 Referring Provider (SLP): Frann Rider NP   Encounter Date: 11/11/2019   End of Session - 11/11/19 1657    Visit Number 10    Number of Visits 17    Authorization Type none    SLP Start Time 1106   5 minutes late   SLP Stop Time  1145    SLP Time Calculation (min) 39 min           Past Medical History:  Diagnosis Date  . Asthma   . Hyperlipidemia   . Migraine   . Rotator cuff tear   . Spondylolisthesis    L4-5    Past Surgical History:  Procedure Laterality Date  . BUBBLE STUDY  07/30/2019   Procedure: BUBBLE STUDY;  Surgeon: Donato Heinz, MD;  Location: [facility];  Service: Cardiovascular;;  . LOOP RECORDER INSERTION N/A 07/30/2019   Procedure: LOOP RECORDER INSERTION;  Surgeon: Evans Lance, MD;  Location: [facility];  Service: Cardiovascular;  Laterality: N/A;  . No prior surgery    . TEE WITHOUT CARDIOVERSION N/A 07/30/2019   Procedure: TRANSESOPHAGEAL ECHOCARDIOGRAM (TEE);  Surgeon: Donato Heinz, MD;  Location: [facility];  Service: Cardiovascular;  Laterality: N/A;    There were no vitals filed for this visit.   Subjective Assessment - 11/11/19 1111    Subjective "I probably tried to do too much yesterday - I washed our bedsheets and cooked a meal. .Marland KitchenMarland KitchenI got dizzy towards the end when I cooked."    Currently in Pain? Yes    Pain Score 2     Pain Location Hand    Pain Orientation Left    Pain Descriptors / Indicators Aching;Numbness;Burning    Pain Onset More than a month ago    Pain Frequency Intermittent                 ADULT SLP TREATMENT - 11/11/19 1120      General Information   Behavior/Cognition Alert;Cooperative;Pleasant mood   flat  affect     Treatment Provided   Treatment provided Cognitive-Linquistic      Cognitive-Linquistic Treatment   Treatment focused on Cognition    Skilled Treatment Spoke with pt's PT and she feels dizziness was a factor of pt gaze/vision and not solely overactivity. Pt walked more on Saturday than other days - pt planned this. On Sunday pt went to church and then caughjt up with family from [place] and family brought food to eat. Divided attention today between conversation and heads-down written task. Pt with extra time with divided attention- turned it into alternating attention task 100% of the time with 90% success. "I usually do my 3 chores in a row." Pt has been returning to first task after going to another task in the middle of her first task. Pt tells SLP that if there is alot of information on a page she gets flustered and confused. Pt has been working with her disability paperwork and has organized papers in the order she has to do them.       Assessment / Recommendations / Plan   Plan Continue with current plan of care      Progression Toward Goals   Progression toward goals Progressing toward goals  SLP Short Term Goals - 10/29/19 1706      SLP SHORT TERM GOAL #1   Title Pt will utiltize external aids to schedule and complete daily tasks with rare min A over 3 sessions    Baseline 10-28-19, 10-29-19    Status Partially Met      SLP SHORT TERM GOAL #2   Title Pt will uitlize compensatory strategies to attend to and process verbal instructions and converstion accurately with rare min A over 2 sessions    Baseline 10-28-19, 10-29-19    Status On-going      SLP SHORT TERM GOAL #3   Title Pt will read 1 paragraph (5-7 sentences) and recall details with rare min A over 2 sessions    Baseline 10-28-19    Status Achieved            SLP Long Term Goals - 11/11/19 1123      SLP LONG TERM GOAL #1   Title Pt will utilize compensations for attention to prepare a main dish  for 3 meals with rare min A    Baseline 10-28-19, 10-29-19, 11-05-19    Status Achieved      SLP LONG TERM GOAL #2   Title Pt will carryover compensatory strategies for attention and processing to run 3 errands in a row successfully with rare min A    Baseline 11-07-19, 11-11-19    Time 3    Period Weeks    Status On-going      SLP LONG TERM GOAL #3   Title Pt will return to ADL/IADL task after a distraction and succesfully complete task before starting a new activity with rare min A over 3 sessions    Baseline 11-05-19, 6-11-216-15-21    Time --    Period --    Status Achieved      SLP LONG TERM GOAL #4   Title pt will incr voice volume in 8 minuts simple conversation to low 70s in 2 sessions    Time 3    Period Weeks    Status New            Plan - 11/11/19 1658    Clinical Impression Statement Hanh continues to present with at least mild but improving cognitive linguistic impairments of attention, processing, and visuospatial skills. See "skilled intervention" for more details. Pt  incr'd number of scheduled chores each day to 3-4, and dinner - she will be monitoring her stamina level. Ongoing training of compensations for cogntive impairments for successful completion of IADL's, intelligiblilty. Continue skilled ST    Speech Therapy Frequency 2x / week    Duration --   8 weeks or 17 visits   Treatment/Interventions Language facilitation;Environmental controls;Cueing hierarchy;SLP instruction and feedback;Compensatory strategies;Functional tasks;Cognitive reorganization;Patient/family education;Multimodal communcation approach;Internal/external aids;Compensatory techniques    Potential to Achieve Goals Good           Patient will benefit from skilled therapeutic intervention in order to improve the following deficits and impairments:   Cognitive communication deficit  Dysarthria and anarthria    Problem List Patient Active Problem List   Diagnosis Date Noted  . CVA  (cerebral vascular accident) ([facility]) 07/29/2019  . Dysphagia 07/29/2019  . Left hand weakness 07/29/2019  . Palpitations 09/15/2015  . Diastolic dysfunction 40/76/8088  . Renal cyst, left 06/19/2013  . History of migraine headaches 03/05/2013  . Complicated migraine 03/31/1593  . HLD (hyperlipidemia) 03/05/2013  . 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 Rotator cuff syndrome 03/05/2013  . Low  back pain 03/05/2013    [facility] ,[place], CCC-SLP  11/11/2019, 5:03 PM  [facility] [address] [place], [place], [address] Phone: [phone]   Fax:  [phone]   Name: ELLEANA STILLSON MRN: [id number] Date of Birth: Jan 01, 1966

## 2019-11-11 NOTE — Telephone Encounter (Signed)
We could try low-dose gabapentin at night for possible benefit but side effects can be increased fatigue, drowsiness, and dizziness which may worsen her current stroke deficits.  If she would like to trial medication, I will place order as long as she verbalized understanding of possible side effects.

## 2019-11-11 NOTE — Therapy (Signed)
[facility] [address] [place], [place], [address] Phone: [phone]   Fax:  [phone]  Physical Therapy Treatment  Patient Details  Name: Tara [name] MRN: [id number] Date of Birth: 02-Oct-1965 No data recorded  Encounter Date: 11/11/2019   PT End of Session - 11/11/19 1327    Visit Number 20    Number of Visits 22    Authorization Type [facility]    PT Start Time 1148    PT Stop Time 1230    PT Time Calculation (min) 42 min    Equipment Utilized During Treatment Gait belt    Activity Tolerance Patient tolerated treatment well   limited/slowed by dizziness; 1-2/10 at times, causes pt to stop, close eyes and rest from activity   Behavior During Therapy [facility] for tasks assessed/performed           Past Medical History:  Diagnosis Date  . Asthma   . Hyperlipidemia   . Migraine   . Rotator cuff tear   . Spondylolisthesis    L4-5    Past Surgical History:  Procedure Laterality Date  . BUBBLE STUDY  07/30/2019   Procedure: BUBBLE STUDY;  Surgeon: Donato Heinz, MD;  Location: [facility];  Service: Cardiovascular;;  . LOOP RECORDER INSERTION N/A 07/30/2019   Procedure: LOOP RECORDER INSERTION;  Surgeon: Evans Lance, MD;  Location: [facility];  Service: Cardiovascular;  Laterality: N/A;  . No prior surgery    . TEE WITHOUT CARDIOVERSION N/A 07/30/2019   Procedure: TRANSESOPHAGEAL ECHOCARDIOGRAM (TEE);  Surgeon: Donato Heinz, MD;  Location: [facility];  Service: Cardiovascular;  Laterality: N/A;    There were no vitals filed for this visit.   Subjective Assessment - 11/11/19 1152    Subjective Was cooking yesterday, was chopping vegetables and looking down a lot and had a dizzy spell where she rated 5/10. Lasted about 10-15 minutes. Thinks she over did it and did not take breaks. Reporting 1/10 dizziness right now.    Patient Stated Goals Pt's goals for therapy are to  strengthen to help with the tired feeling.    Currently in Pain? Yes    Pain Score 2     Pain Location Hand    Pain Orientation Left    Pain Onset More than a month ago                       11/11/19 1203  Ambulation/Gait  Ambulation/Gait Yes  Ambulation/Gait Assistance 5: Supervision  Ambulation/Gait Assistance Details ambulated indoor surfaces and outdoors over unlevel surfaces such as grass, pavement with walking pole, pt with no LOB and improved gait stability, pt still with slowed movement when performing turns. no LOB. pt reports not yet purchasing walking pole - states that her daughter might have an extra one that pt will be able to use  Ambulation Distance (Feet) 250 Feet (x115' indoors)  Assistive device Other (Comment) (single walking pole)  Gait Pattern Step-through pattern;Decreased arm swing - right;Decreased step length - right;Decreased stride length;Decreased trunk rotation;Wide base of support (improved L arm swing with use of walking pole)  Ambulation Surface Level;Indoor;Unlevel;Outdoor;Paved;Gravel;Grass  Curb 5: Supervision  Curb Details (indicate cue type and reason) with walking pole, cues for technique and proper sequencing x2 reps  Gait Comments next to countertop: forward gait down and back x2 reps with head turns R/L rating 1-2/10 dizziness, then performed with red compliant surface down and back x2 reps  with pt reporting 3/10 dizziness, needing standing rest break to subside. retro gait down and back x2 reps with cues for incr step length B  Self-Care  Self-Care Other Self-Care Comments  Other Self-Care Comments  Pt reports that she cooked dinner yesterday and spent a lot of time cutting vegetables and she reported that she got up to a 5/10 dizziness and was unable to shower on her own afterwards. Applauded pt for increasing activity around the house and also discussed with pt taking a break when reaching 4-5/10 threshold for dizziness and waiting  for symptoms to subside before continuing activity vs. pushing through a 4-5/10 dizziness without taking a break in order to incr tolerance for IADLs. Pt reported understanding   Knee/Hip Exercises: Aerobic  Stepper SciFit level 1.5 with BLE and BUE RPM 40-45 for incr activity tolerance, BLE mobility and strength for 5 minutes                PT Short Term Goals - 09/10/19 0944      PT SHORT TERM GOAL #1   Title Pt will be independent with HEP for improved strength, balance, and gait.  TARGET for all STGs:  4 weeks:  09/05/2019    Time 4    Period Weeks    Status Achieved      PT SHORT TERM GOAL #2   Title Pt will improve 5x sit<>stand to less than or equal to 15 seconds for improved functional lower extremity strength.    Baseline 21.68 seconds on 09/03/19    Time 4    Period Weeks    Status Not Met      PT SHORT TERM GOAL #3   Title Pt will improve DGI score to at least 16/24 for decresaed fall risk.    Baseline 10/24 on 09/10/19    Time 4    Period Weeks    Status Not Met      PT SHORT TERM GOAL #4   Title Pt will verbalize understanding of:  fall prevention in home environment and CVA warning signs/symptoms    Time 4    Period Weeks    Status Achieved             PT Long Term Goals - 10/20/19 1258      PT LONG TERM GOAL #1   Title Pt will be independent with progression of HEP to address strength, balance, gait, and vestibular impairments.  TARGET 11/26/2019 for all LTGs    Baseline pt will continue to benefit from further revisions/additions to HEP    Time 5    Period Weeks    Status On-going      PT LONG TERM GOAL #2   Title Pt perform sit>stand in less than 5 seconds for improved transfers and initiation of gait.    Baseline 38.9 seconds with no UE support from standard chair, 39.97 sec 10/20/2019; 1st sit<>stand takes >9 seconds    Time 5    Period Weeks    Status Revised      PT LONG TERM GOAL #3   Title Pt will improve DGI score to at least 15/24 for  decreased fall risk and improved functional mobility.    Baseline 9/24 on 09/25/19; 12/24 10/20/2019    Time 5    Period Weeks    Status Revised      PT LONG TERM GOAL #4   Title Pt will ambulate at least 500 ft, indoor and outdoor surfaces, with supervision and  no LOB for return to independent community gait.    Baseline 230 ft indoors 10/20/2019    Time 5    Period Weeks    Status On-going      PT LONG TERM GOAL #5   Title Pt will improve gait speed to at least 2 ft/sec for improved gait efficiency and decr fall risk.    Baseline 1.57 ft/sec on 09/25/19; 1.75 ft/sec    Time 5    Period Weeks    Status Revised                 Plan - 11/11/19 1336    Clinical Impression Statement Today's skilled session continued to focus on gait training with single walking pole, incorporated walking outdoors today over compliant surfaces with no LOB. Pt continues with improved LUE arm swing and incr stability with gait, no LOB noted on compliant surfaces. Pt able to consistently maintain rpm of 40-45 on SciFit today (previously session only 31-35). Pt's dizziness did incr to a 3/10 with head rotations with gait on compliant surface, did decr with standing rest break. Will continue to progress towards LTGs.    Personal Factors and Comorbidities Comorbidity 3+    Comorbidities PMH:  asthma, hyperlipidemia, spondyloslisthesis, R RTC tear    Examination-Activity Limitations Locomotion Level;Transfers;Stand;Stairs    Examination-Participation Restrictions Community Activity;Yard Work;Other   walking for exercise in neighborhood, return to work   Stability/Clinical Decision Making Evolving/Moderate complexity    Rehab Potential Good    PT Frequency 1x / week    PT Duration Other (comment)   5 more weeks, per recert 07/24/7503   PT Treatment/Interventions ADLs/Self Care Home Management;Gait training;Stair training;Functional mobility training;Therapeutic activities;Therapeutic exercise;Balance  training;Neuromuscular re-education;DME Instruction;Patient/family education;Electrical Stimulation;Orthotic Fit/Training;Vestibular    PT Next Visit Plan did pt get walking pole? NuStep/SciFit for movement.  Gait along counter-forward/back, forward with head turns (try on compliant surface); gait training with single walking pole; work on increasing speed and excursion of movements; corner balance, corner trunk rotation; progress towards LTGs    Consulted and Agree with Plan of Care Patient           Patient will benefit from skilled therapeutic intervention in order to improve the following deficits and impairments:  Abnormal gait, Difficulty walking, Decreased endurance, Decreased balance, Decreased activity tolerance, Decreased strength, Decreased mobility  Visit Diagnosis: Unsteadiness on feet  Other abnormalities of gait and mobility  Other lack of coordination  Other symptoms and signs involving cognitive functions following cerebral infarction  Muscle weakness (generalized)     Problem List Patient Active Problem List   Diagnosis Date Noted  . CVA (cerebral vascular accident) ([facility]) 07/29/2019  . Dysphagia 07/29/2019  . Left hand weakness 07/29/2019  . Palpitations 09/15/2015  . Diastolic dysfunction 18/33/5825  . Renal cyst, left 06/19/2013  . History of migraine headaches 03/05/2013  . Complicated migraine 18/98/4210  . HLD (hyperlipidemia) 03/05/2013  . 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 Rotator cuff syndrome 03/05/2013  . Low back pain 03/05/2013    Arliss Journey, PT, DPT  11/11/2019, 1:39 PM  [facility] [address] [place], [place], [address] Phone: [phone]   Fax:  [phone]  Name: Tara [name] MRN: [id number] Date of Birth: 1965-08-31

## 2019-11-12 ENCOUNTER — Encounter: Payer: Self-pay | Admitting: Speech Pathology

## 2019-11-12 ENCOUNTER — Ambulatory Visit: Payer: 59 | Admitting: Occupational Therapy

## 2019-11-12 ENCOUNTER — Ambulatory Visit: Payer: 59 | Admitting: Speech Pathology

## 2019-11-12 DIAGNOSIS — R2681 Unsteadiness on feet: Secondary | ICD-10-CM | POA: Diagnosis not present

## 2019-11-12 DIAGNOSIS — R471 Dysarthria and anarthria: Secondary | ICD-10-CM | POA: Diagnosis not present

## 2019-11-12 DIAGNOSIS — I69318 Other symptoms and signs involving cognitive functions following cerebral infarction: Secondary | ICD-10-CM

## 2019-11-12 DIAGNOSIS — R41841 Cognitive communication deficit: Secondary | ICD-10-CM | POA: Diagnosis not present

## 2019-11-12 DIAGNOSIS — R2689 Other abnormalities of gait and mobility: Secondary | ICD-10-CM | POA: Diagnosis not present

## 2019-11-12 DIAGNOSIS — M6281 Muscle weakness (generalized): Secondary | ICD-10-CM | POA: Diagnosis not present

## 2019-11-12 DIAGNOSIS — R278 Other lack of coordination: Secondary | ICD-10-CM | POA: Diagnosis not present

## 2019-11-12 DIAGNOSIS — R42 Dizziness and giddiness: Secondary | ICD-10-CM | POA: Diagnosis not present

## 2019-11-12 NOTE — Therapy (Signed)
[facility] [address] [place], [place], [address] Phone: [phone]   Fax:  [phone]  Occupational Therapy Treatment  Patient Details  Name: Tara [name] MRN: [id number] Date of Birth: Aug 04, 1965 No data recorded  Encounter Date: 11/12/2019   OT End of Session - 11/12/19 1415    Visit Number 17    Number of Visits 28    Date for OT Re-Evaluation 12/14/19    Authorization Type [facility] [employer]    Authorization Time Period week 4/8    OT Start Time 1320    OT Stop Time 1405    OT Time Calculation (min) 45 min    Activity Tolerance Patient tolerated treatment well    Behavior During Therapy [facility] for tasks assessed/performed           Past Medical History:  Diagnosis Date  . Asthma   . Hyperlipidemia   . Migraine   . Rotator cuff tear   . Spondylolisthesis    L4-5    Past Surgical History:  Procedure Laterality Date  . BUBBLE STUDY  07/30/2019   Procedure: BUBBLE STUDY;  Surgeon: [name], MD;  Location: [facility] ENDOSCOPY;  Service: Cardiovascular;;  . LOOP RECORDER INSERTION N/A 07/30/2019   Procedure: LOOP RECORDER INSERTION;  Surgeon: [name], MD;  Location: [facility] INVASIVE CV LAB;  Service: Cardiovascular;  Laterality: N/A;  . No prior surgery    . TEE WITHOUT CARDIOVERSION N/A 07/30/2019   Procedure: TRANSESOPHAGEAL ECHOCARDIOGRAM (TEE);  Surgeon: [name], MD;  Location: [facility] ENDOSCOPY;  Service: Cardiovascular;  Laterality: N/A;    There were no vitals filed for this visit.   Subjective Assessment - 11/12/19 1326    Pertinent History CVA 07/29/19. PMH: HLD, migraines, asthma, small chronic cerebellar infarcts    Limitations fall risk, loop recorder, ? swallowing precautions (chin tuck)    Patient Stated Goals typing, cooking, cleaning, driving, working    Currently in Pain? Yes    Pain Score 3     Pain Location --   long and ring fingers  down to elbow   Pain Orientation  Left    Pain Descriptors / Indicators Aching;Tingling;Numbness    Pain Type Acute pain    Pain Onset More than a month ago    Pain Frequency Intermittent    Aggravating Factors  malpositioning    Pain Relieving Factors rest           Functional ambulation with environmental scanning using walking stick and min to mod cueing to locate items in gym and not stopping or slowing down w/ ambulation. Progressed to same task while scanning and calling out cards placed in Rt/Lt visual fields by therapist (walking behind pt) w/ decreased head turns (worse on Lt and lower quadrants).  Typing - copying large print text (2 paragraphs) with only min errors but extra time required.  Pt reports chopping some vegetables and cooking meal recently, as well as changing sheet on bed w/ husband's assist. Pt also had concerns and questions about filling out disability papers                        OT Short Term Goals - 10/07/19 1408      OT SHORT TERM GOAL #1   Title Independent with HEP for LUE strengthening (shoulder and hand)    Time 3    Period Weeks    Status Achieved      OT  SHORT TERM GOAL #2   Title Grip strength Lt hand to be 45 lbs or greater    Baseline 35 lbs (Rt = 56)    Time 3    Period Weeks    Status On-going   42.5, 46- inconsistent     OT SHORT TERM GOAL #3   Title Pt will increase typing speed to 20 wpm, 90% accuracy in prep for return to work    Time 3    Period Weeks    Status On-going   18 wpm     OT SHORT TERM GOAL #4   Title Pt to perform environmental scanning w/ head turns and no LOB, min dizziness w/ 90% accuracy    Time 3    Period Weeks    Status On-going             OT Long Term Goals - 10/16/19 1718      OT LONG TERM GOAL #1   Title Pt to report less drops Lt hand t/o day    Time 6    Period Weeks    Status On-going   1-2 drops/day     OT LONG TERM GOAL #2   Title Pt to perform cleaning tasks for 20 min w/o rest mod I level    Time  6    Period Weeks    Status On-going   Pt reports cleaning vacuuming 1-2 rooms but then has to rest; pt requires several rest breaks     OT LONG TERM GOAL #3   Title Pt to perform full cooking tasks mod I level safely    Time 6    Period Weeks    Status On-going   not consistently, Pt reports heating somethin up, simple stovetop meal (eggs)     OT LONG TERM GOAL #4   Title Pt to simulate work tasks w/ extra time as required    Time 6    Period Weeks    Status On-going                 Plan - 11/12/19 1418    Clinical Impression Statement Pt w/ gradual progress towards function, limited by diziness and visual overstimulation    Occupational performance deficits (Please refer to evaluation for details): IADL's;Work;Leisure    Body Structure / Function / Physical Skills ADL;IADL;Strength;Coordination;FMC;UE functional use;Vision    Cognitive Skills Perception;Problem Solve;Attention    Rehab Potential Good    OT Frequency 2x / week    OT Duration 8 weeks    OT Treatment/Interventions Self-care/ADL training;Therapeutic exercise;Functional Mobility Training;Neuromuscular education;Manual Therapy;Therapeutic activities;Coping strategies training;DME and/or AE instruction;Cognitive remediation/compensation;Visual/perceptual remediation/compensation;Moist Heat;Passive range of motion;Patient/family education    Plan continue to address environmental scanning/ dynamic balance, fine motor coordination    Consulted and Agree with Plan of Care Patient           Patient will benefit from skilled therapeutic intervention in order to improve the following deficits and impairments:   Body Structure / Function / Physical Skills: ADL, IADL, Strength, Coordination, FMC, UE functional use, Vision Cognitive Skills: Perception, Problem Solve, Attention     Visit Diagnosis: Other symptoms and signs involving cognitive functions following cerebral infarction  Other lack of  coordination  Unsteadiness on feet    Problem List Patient Active Problem List   Diagnosis Date Noted  . CVA (cerebral vascular accident) (HCC) 07/29/2019  . Dysphagia 07/29/2019  . Left hand weakness 07/29/2019  . Palpitations 09/15/2015  . Diastolic dysfunction  09/15/2015  . Renal cyst, left 06/19/2013  . History of migraine headaches 03/05/2013  . Complicated migraine 16/02/9603  . HLD (hyperlipidemia) 03/05/2013  . 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 Rotator cuff syndrome 03/05/2013  . Low back pain 03/05/2013    Carey Bullocks, OTR/L 11/12/2019, 2:39 PM  [facility] [address] [place], [place], [address] Phone: [phone]   Fax:  [phone]  Name: KEYASIA JOLLIFF MRN: [id number] Date of Birth: [birth year]

## 2019-11-12 NOTE — Telephone Encounter (Signed)
I called pt and LMVM for her re: JM/NP response to try gabapentin if she is interested in this (even with possible SE).  I sent messge on mychart that she may view as well.  Will await call or message.

## 2019-11-12 NOTE — Therapy (Signed)
[facility] [address] [place], [place], [address] Phone: [phone]   Fax:  [phone]  Speech Language Pathology Treatment  Patient Details  Name: Tara [name] MRN: [id number] Date of Birth: 1966-02-15 Referring Provider (SLP): Frann Rider NP   Encounter Date: 11/12/2019   End of Session - 11/12/19 1500    Visit Number 11    Number of Visits 17    Date for SLP Re-Evaluation 11/26/19    SLP Start Time 0962    SLP Stop Time  1448    SLP Time Calculation (min) 43 min    Activity Tolerance Patient tolerated treatment well           Past Medical History:  Diagnosis Date  . Asthma   . Hyperlipidemia   . Migraine   . Rotator cuff tear   . Spondylolisthesis    L4-5    Past Surgical History:  Procedure Laterality Date  . BUBBLE STUDY  07/30/2019   Procedure: BUBBLE STUDY;  Surgeon: Donato Heinz, MD;  Location: [facility];  Service: Cardiovascular;;  . LOOP RECORDER INSERTION N/A 07/30/2019   Procedure: LOOP RECORDER INSERTION;  Surgeon: Evans Lance, MD;  Location: [facility] CV LAB;  Service: Cardiovascular;  Laterality: N/A;  . No prior surgery    . TEE WITHOUT CARDIOVERSION N/A 07/30/2019   Procedure: TRANSESOPHAGEAL ECHOCARDIOGRAM (TEE);  Surgeon: Donato Heinz, MD;  Location: [facility] ENDOSCOPY;  Service: Cardiovascular;  Laterality: N/A;    There were no vitals filed for this visit.   Subjective Assessment - 11/12/19 1423    Subjective "I get dizzy when I look down or turn"    Currently in Pain? Yes    Pain Score 2     Pain Location Finger (Comment which one)    Pain Orientation Left    Pain Descriptors / Indicators Aching;Tingling;Numbness    Pain Type Chronic pain    Pain Onset More than a month ago    Pain Frequency Intermittent    Aggravating Factors  malpositioning                 ADULT SLP TREATMENT - 11/12/19 1424      General Information    Behavior/Cognition Alert;Cooperative;Pleasant mood      Treatment Provided   Treatment provided Cognitive-Linquistic      Cognitive-Linquistic Treatment   Treatment focused on Cognition;Patient/family/caregiver education    Skilled Treatment Pt reports she is doing chores at home but dizziness interferes. Myles states she is overwhelmed re: disability paper work and has difficulty concentrating to fill it out. Generated strategy of choosing 2-3 sections on a page to fill out each day and gather the infomration for those sections only to break up this task. Pt spontaneously alternates attention, rather than dividing attention in attempted divided attention task. However, she is accurate.       Assessment / Recommendations / Plan   Plan Continue with current plan of care      Progression Toward Goals   Progression toward goals Progressing toward goals   dizziness and nausea affecting ability to complete tasks             SLP Short Term Goals - 11/12/19 1459      SLP SHORT TERM GOAL #1   Title Pt will utiltize external aids to schedule and complete daily tasks with rare min A over 3 sessions    Baseline 10-28-19, 10-29-19    Status Partially Met  SLP SHORT TERM GOAL #2   Title Pt will uitlize compensatory strategies to attend to and process verbal instructions and converstion accurately with rare min A over 2 sessions    Baseline 10-28-19, 10-29-19    Status On-going      SLP SHORT TERM GOAL #3   Title Pt will read 1 paragraph (5-7 sentences) and recall details with rare min A over 2 sessions    Baseline 10-28-19    Status Achieved            SLP Long Term Goals - 11/12/19 1459      SLP LONG TERM GOAL #1   Title Pt will utilize compensations for attention to prepare a main dish for 3 meals with rare min A    Baseline 10-28-19, 10-29-19, 11-05-19    Status Achieved      SLP LONG TERM GOAL #2   Title Pt will carryover compensatory strategies for attention and processing to run 3  errands in a row successfully with rare min A    Baseline 11-07-19, 11-11-19    Time 3    Period Weeks    Status On-going      SLP LONG TERM GOAL #3   Title Pt will return to ADL/IADL task after a distraction and succesfully complete task before starting a new activity with rare min A over 3 sessions    Baseline 11-05-19, 6-11-216-15-21    Status Achieved      SLP LONG TERM GOAL #4   Title pt will incr voice volume in 8 minuts simple conversation to low 70s in 2 sessions    Time 3    Period Weeks    Status New            Plan - 11/12/19 1458    Clinical Impression Statement Riana continues to present with at least mild but improving cognitive linguistic impairments of attention, processing, and visuospatial skills. See "skilled intervention" for more details. Pt  incr'd number of scheduled chores each day to 3-4, and dinner - she will be monitoring her stamina level. Ongoing training of compensations for cogntive impairments for successful completion of IADL's, intelligiblilty. Continue skilled ST    Speech Therapy Frequency 2x / week    Duration --   8 weeks or 17 visits   Treatment/Interventions Language facilitation;Environmental controls;Cueing hierarchy;SLP instruction and feedback;Compensatory strategies;Functional tasks;Cognitive reorganization;Patient/family education;Multimodal communcation approach;Internal/external aids;Compensatory techniques    Potential to Achieve Goals Good           Patient will benefit from skilled therapeutic intervention in order to improve the following deficits and impairments:   Cognitive communication deficit    Problem List Patient Active Problem List   Diagnosis Date Noted  . CVA (cerebral vascular accident) ([place]) 07/29/2019  . Dysphagia 07/29/2019  . Left hand weakness 07/29/2019  . Palpitations 09/15/2015  . Diastolic dysfunction 29/51/8841  . Renal cyst, left 06/19/2013  . History of migraine headaches 03/05/2013  .  Complicated migraine 66/10/3014  . HLD (hyperlipidemia) 03/05/2013  . 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 Rotator cuff syndrome 03/05/2013  . Low back pain 03/05/2013    An Lannan, Annye Rusk MS, CCC-SLP 11/12/2019, 3:00 PM  [facility] [address] [place], [place], [address] Phone: [phone]   Fax:  [phone]   Name: CHANTELLA CREECH MRN: [id number] Date of Birth: 1965-11-01

## 2019-11-13 NOTE — Telephone Encounter (Signed)
I called pt and relayed that per JM/NP :   Majority of medications that could be used to help with symptoms will cause similar side effects of gabapentin therefore would recommend continuation with therapy for symptom management.  The appt mentioned was for her regular f/u.

## 2019-11-13 NOTE — Telephone Encounter (Signed)
I called pt to f/u on message she has not seen in mychart.  She does not get on her computer mych.  She continues with decreased stamina and dizzy spells (sporadic).  Has tried neurotin in past (does not recall effects).  Due to possible SE of gabapentin and her current sx she did not want to try.  She is willing to try something else.  Has appt 11-18-19 for evaluation.  Be on look out for [employer] disability pw.

## 2019-11-13 NOTE — Telephone Encounter (Signed)
Majority of medications that could be used to help with symptoms will cause similar side effects of gabapentin therefore would recommend continuation with therapy for symptom management.  No indication for follow-up visit unless a different concern is needed to be addressed.

## 2019-11-17 ENCOUNTER — Ambulatory Visit: Payer: 59 | Admitting: Physical Therapy

## 2019-11-17 ENCOUNTER — Encounter: Payer: Self-pay | Admitting: Physical Therapy

## 2019-11-17 ENCOUNTER — Encounter: Payer: Self-pay | Admitting: Speech Pathology

## 2019-11-17 ENCOUNTER — Ambulatory Visit: Payer: 59 | Admitting: Speech Pathology

## 2019-11-17 ENCOUNTER — Other Ambulatory Visit: Payer: Self-pay

## 2019-11-17 DIAGNOSIS — R2681 Unsteadiness on feet: Secondary | ICD-10-CM | POA: Diagnosis not present

## 2019-11-17 DIAGNOSIS — R42 Dizziness and giddiness: Secondary | ICD-10-CM | POA: Diagnosis not present

## 2019-11-17 DIAGNOSIS — R41841 Cognitive communication deficit: Secondary | ICD-10-CM | POA: Diagnosis not present

## 2019-11-17 DIAGNOSIS — I69318 Other symptoms and signs involving cognitive functions following cerebral infarction: Secondary | ICD-10-CM | POA: Diagnosis not present

## 2019-11-17 DIAGNOSIS — M6281 Muscle weakness (generalized): Secondary | ICD-10-CM

## 2019-11-17 DIAGNOSIS — R278 Other lack of coordination: Secondary | ICD-10-CM

## 2019-11-17 DIAGNOSIS — R2689 Other abnormalities of gait and mobility: Secondary | ICD-10-CM | POA: Diagnosis not present

## 2019-11-17 DIAGNOSIS — R471 Dysarthria and anarthria: Secondary | ICD-10-CM

## 2019-11-17 NOTE — Therapy (Signed)
[facility] [address] [place], [place], [address] Phone: [phone]   Fax:  [phone]  Speech Language Pathology Treatment  Patient Details  Name: Tara [name] MRN: [id number] Date of Birth: 07/25/1965 Referring Provider (SLP): Frann Rider NP   Encounter Date: 11/17/2019   End of Session - 11/17/19 1221    Visit Number 12    Number of Visits 17    Date for SLP Re-Evaluation 11/26/19    Authorization Type none    SLP Start Time 1020    SLP Stop Time  1059    SLP Time Calculation (min) 39 min    Activity Tolerance Patient tolerated treatment well           Past Medical History:  Diagnosis Date  . Asthma   . Hyperlipidemia   . Migraine   . Rotator cuff tear   . Spondylolisthesis    L4-5    Past Surgical History:  Procedure Laterality Date  . BUBBLE STUDY  07/30/2019   Procedure: BUBBLE STUDY;  Surgeon: Donato Heinz, MD;  Location: [facility];  Service: Cardiovascular;;  . LOOP RECORDER INSERTION N/A 07/30/2019   Procedure: LOOP RECORDER INSERTION;  Surgeon: Evans Lance, MD;  Location: [facility] CV LAB;  Service: Cardiovascular;  Laterality: N/A;  . No prior surgery    . TEE WITHOUT CARDIOVERSION N/A 07/30/2019   Procedure: TRANSESOPHAGEAL ECHOCARDIOGRAM (TEE);  Surgeon: Donato Heinz, MD;  Location: [facility] ENDOSCOPY;  Service: Cardiovascular;  Laterality: N/A;    There were no vitals filed for this visit.   Subjective Assessment - 11/17/19 1029    Subjective "I am really dragging today"    Currently in Pain? No/denies                 ADULT SLP TREATMENT - 11/17/19 1029      General Information   Behavior/Cognition Alert;Cooperative;Pleasant mood      Treatment Provided   Treatment provided Cognitive-Linquistic      Cognitive-Linquistic Treatment   Treatment focused on Cognition;Patient/family/caregiver education    Skilled Treatment Pt verbalizes surprise  that she has fatigue today after having 2 father's day celebration. We discussed energy conservation and planning out activities and preparing for fatigue. She attempted grocery store, ans was able to get 3/8 items due to dizziness and sensory hyperstimulation. Reviewed strategies for sensory hyperstimulation. Speech volume addressed today - initial volume sub WNL averaged 65dB. She did report some people not hearing her aat the park party this weekend. Trained her in HEP for dysarthria with occasional min A Pt generated strategy of getting 1-3 items at a time - she will mentall plan her trip before going in. Also trained pt in strategy of doing her meal prep early in the day then rest, then finish the meal       Assessment / Recommendations / [place] with current plan of care      Progression Toward Goals   Progression toward goals Progressing toward goals            SLP Education - 11/17/19 1219    Education Details HEP for dysarthria; compensations for attention, memory, processing    Person(s) Educated Patient    Methods Explanation;Handout    Comprehension Verbalized understanding;Returned demonstration;Verbal cues required;Need further instruction            SLP Short Term Goals - 11/17/19 1220      SLP SHORT TERM GOAL #1  Title Pt will utiltize external aids to schedule and complete daily tasks with rare min A over 3 sessions    Baseline 10-28-19, 10-29-19    Status Partially Met      SLP SHORT TERM GOAL #2   Title Pt will uitlize compensatory strategies to attend to and process verbal instructions and converstion accurately with rare min A over 2 sessions    Baseline 10-28-19, 10-29-19    Status On-going      SLP SHORT TERM GOAL #3   Title Pt will read 1 paragraph (5-7 sentences) and recall details with rare min A over 2 sessions    Baseline 10-28-19    Status Achieved            SLP Long Term Goals - 11/17/19 1220      SLP LONG TERM GOAL #1   Title Pt will  utilize compensations for attention to prepare a main dish for 3 meals with rare min A    Baseline 10-28-19, 10-29-19, 11-05-19    Status Achieved      SLP LONG TERM GOAL #2   Title Pt will carryover compensatory strategies for attention and processing to run 3 errands in a row successfully with rare min A    Baseline 11-07-19, 11-11-19    Time 2    Period Weeks    Status On-going      SLP LONG TERM GOAL #3   Title Pt will return to ADL/IADL task after a distraction and succesfully complete task before starting a new activity with rare min A over 3 sessions    Baseline 11-05-19, 6-11-216-15-21    Status Achieved      SLP LONG TERM GOAL #4   Title pt will incr voice volume in 8 minuts simple conversation to low 70s in 2 sessions    Time 2    Period Weeks    Status On-going            Plan - 11/17/19 1220    Clinical Impression Statement Tara [name] continues to present with at least mild but improving cognitive linguistic impairments of attention, processing, and visuospatial skills. See "skilled intervention" for more details. Pt  incr'd number of scheduled chores each day to 3-4, and dinner - she will be monitoring her stamina level. Ongoing training of compensations for cogntive impairments for successful completion of IADL's, intelligiblilty. Continue skilled ST    Speech Therapy Frequency 2x / week    Duration --   8 weeks or 17 visits   Treatment/Interventions Language facilitation;Environmental controls;Cueing hierarchy;SLP instruction and feedback;Compensatory strategies;Functional tasks;Cognitive reorganization;Patient/family education;Multimodal communcation approach;Internal/external aids;Compensatory techniques    Potential to Achieve Goals Good           Patient will benefit from skilled therapeutic intervention in order to improve the following deficits and impairments:   Dysarthria and anarthria  Cognitive communication deficit    Problem List Patient Active Problem List    Diagnosis Date Noted  . CVA (cerebral vascular accident) ([place]) 07/29/2019  . Dysphagia 07/29/2019  . Left hand weakness 07/29/2019  . Palpitations 09/15/2015  . Diastolic dysfunction 26/20/3559  . Renal cyst, left 06/19/2013  . History of migraine headaches 03/05/2013  . Complicated migraine 74/16/3845  . HLD (hyperlipidemia) 03/05/2013  . 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 Rotator cuff syndrome 03/05/2013  . Low back pain 03/05/2013    Tara [name], Annye Rusk MS, CCC-SLP 11/17/2019, 12:23 PM  [facility] [address] [place], [place], [address]  Phone: [phone]   Fax:  [phone]   Name: Tara [name] MRN: [id number] Date of Birth: [birth year]

## 2019-11-17 NOTE — Therapy (Signed)
[facility] [address] [place], [place], [address] Phone: [phone]   Fax:  [phone]  Physical Therapy Treatment  Patient Details  Name: Tara [name] MRN: [id number] Date of Birth: Jun 23, 1965 No data recorded  Encounter Date: 11/17/2019   PT End of Session - 11/17/19 1152    Visit Number 21    Number of Visits 22    Authorization Type [facility]    PT Start Time 1105    PT Stop Time 1146    PT Time Calculation (min) 41 min    Equipment Utilized During Treatment Gait belt    Activity Tolerance Patient tolerated treatment well   limited/slowed by dizziness; 1-2/10 at times, causes pt to stop, close eyes and rest from activity   Behavior During Therapy [facility] for tasks assessed/performed           Past Medical History:  Diagnosis Date  . Asthma   . Hyperlipidemia   . Migraine   . Rotator cuff tear   . Spondylolisthesis    L4-5    Past Surgical History:  Procedure Laterality Date  . BUBBLE STUDY  07/30/2019   Procedure: BUBBLE STUDY;  Surgeon: Donato Heinz, MD;  Location: [place];  Service: Cardiovascular;;  . LOOP RECORDER INSERTION N/A 07/30/2019   Procedure: LOOP RECORDER INSERTION;  Surgeon: Evans Lance, MD;  Location: [place] [facility];  Service: Cardiovascular;  Laterality: N/A;  . No prior surgery    . TEE WITHOUT CARDIOVERSION N/A 07/30/2019   Procedure: TRANSESOPHAGEAL ECHOCARDIOGRAM (TEE);  Surgeon: Donato Heinz, MD;  Location: [facility] ENDOSCOPY;  Service: Cardiovascular;  Laterality: N/A;    There were no vitals filed for this visit.   Subjective Assessment - 11/17/19 1108    Subjective Had a busy weekend, dragging more today. "Took a lot out of me". Felt like all of her energy was gone this morning. Tried to do some dance moves with her friends, but it as too much head movement and turns and had to stop. Today dizziness is a 1/10. Last Friday got dizzy inside a store,  thinks she was overstimulated and got dizzy there - felt like she wanted to vomit and then went home. No headache today. Still has to get a walking stick.    Patient Stated Goals Pt's goals for therapy are to strengthen to help with the tired feeling.    Currently in Pain? Yes    Pain Score 2     Pain Location Finger (Comment which one)    Pain Orientation Left    Pain Descriptors / Indicators Aching;Tingling;Numbness;Crying    Pain Onset More than a month ago                             [facility] Adult PT Treatment/Exercise - 11/17/19 1116      Ambulation/Gait   Ambulation/Gait Yes    Ambulation/Gait Assistance 5: Supervision;4: Min guard    Ambulation/Gait Assistance Details 2 laps indoors with walking pole with having pt scan environment and look for cones, cues for incr head movements and to continue gait while scanning environment, ambulated outdoors over unlevel grass surfaces with walking stick with pt demonstrating no LOB, but slower gait speed    Ambulation Distance (Feet) 230 Feet   indoors, plus 230' outdoors   Assistive device Other (Comment);None   single walking stick   Gait Pattern Step-through pattern;Decreased arm swing - right;Decreased  step length - right;Decreased stride length;Decreased trunk rotation;Wide base of support   improved L arm swing with use of walking pole   Ambulation Surface Level;Indoor;Unlevel;Outdoor    Gait Comments with no AD: on mulch side stepping down and back 3 x 15', backwards walking down and back x3 reps with min guard for balance      Exercises   Exercises Other Exercises    Other Exercises  standing in corner with wide BOS twisting to tap opposite wall x5 reps B with cues to pivot on foot and look at hand, pt reporting 2/10 dizziness afterwards, then performed an additional x3 reps B after dizziness subsided, pt needing intermittent UE support on chair       Knee/Hip Exercises: Aerobic   Stepper SciFit level 2.0 with BLE and  BUE RPM 40-45 for incr activity tolerance, BLE mobility and strength for 6 minutes                   PT Education - 11/17/19 1151    Education Details reviewed with pt where to purchase walking poles    Person(s) Educated Patient    Methods Explanation    Comprehension Verbalized understanding            PT Short Term Goals - 09/10/19 0944      PT SHORT TERM GOAL #1   Title Pt will be independent with HEP for improved strength, balance, and gait.  TARGET for all STGs:  4 weeks:  09/05/2019    Time 4    Period Weeks    Status Achieved      PT SHORT TERM GOAL #2   Title Pt will improve 5x sit<>stand to less than or equal to 15 seconds for improved functional lower extremity strength.    Baseline 21.68 seconds on 09/03/19    Time 4    Period Weeks    Status Not Met      PT SHORT TERM GOAL #3   Title Pt will improve DGI score to at least 16/24 for decresaed fall risk.    Baseline 10/24 on 09/10/19    Time 4    Period Weeks    Status Not Met      PT SHORT TERM GOAL #4   Title Pt will verbalize understanding of:  fall prevention in home environment and CVA warning signs/symptoms    Time 4    Period Weeks    Status Achieved             PT Long Term Goals - 10/20/19 1258      PT LONG TERM GOAL #1   Title Pt will be independent with progression of HEP to address strength, balance, gait, and vestibular impairments.  TARGET 11/26/2019 for all LTGs    Baseline pt will continue to benefit from further revisions/additions to HEP    Time 5    Period Weeks    Status On-going      PT LONG TERM GOAL #2   Title Pt perform sit>stand in less than 5 seconds for improved transfers and initiation of gait.    Baseline 38.9 seconds with no UE support from standard chair, 39.97 sec 10/20/2019; 1st sit<>stand takes >9 seconds    Time 5    Period Weeks    Status Revised      PT LONG TERM GOAL #3   Title Pt will improve DGI score to at least 15/24 for decreased fall risk and  improved functional mobility.  Baseline 9/24 on 09/25/19; 12/24 10/20/2019    Time 5    Period Weeks    Status Revised      PT LONG TERM GOAL #4   Title Pt will ambulate at least 500 ft, indoor and outdoor surfaces, with supervision and no LOB for return to independent community gait.    Baseline 230 ft indoors 10/20/2019    Time 5    Period Weeks    Status On-going      PT LONG TERM GOAL #5   Title Pt will improve gait speed to at least 2 ft/sec for improved gait efficiency and decr fall risk.    Baseline 1.57 ft/sec on 09/25/19; 1.75 ft/sec    Time 5    Period Weeks    Status Revised                 Plan - 11/17/19 1154    Clinical Impression Statement Focused on additional gait training today with single walking pole over level indoor surfaces with scanning environment and gait over unlevel compliant surfaces outdoors. Pt continues to remain guarded with head turns with scanning, even with use of walking pole. Needed cues in order to maintain gait speed. At most pt reporting 2/10 dizziness during session after standing trunk rotations in corner that subsided after minimal standing rest break. Reviewed with pt where to purchase single walking pole for home use. Will continue to progress towards LTGs.    Personal Factors and Comorbidities Comorbidity 3+    Comorbidities PMH:  asthma, hyperlipidemia, spondyloslisthesis, R RTC tear    Examination-Activity Limitations Locomotion Level;Transfers;Stand;Stairs    Examination-Participation Restrictions Community Activity;Yard Work;Other   walking for exercise in neighborhood, return to work   Stability/Clinical Decision Making Evolving/Moderate complexity    Rehab Potential Good    PT Frequency 1x / week    PT Duration Other (comment)   5 more weeks, per recert 09/04/8117   PT Treatment/Interventions ADLs/Self Care Home Management;Gait training;Stair training;Functional mobility training;Therapeutic activities;Therapeutic  exercise;Balance training;Neuromuscular re-education;DME Instruction;Patient/family education;Electrical Stimulation;Orthotic Fit/Training;Vestibular    PT Next Visit Plan check LTGs and review HEP. did pt get walking pole? NuStep/SciFit for movement.  Gait along counter-forward/back, forward with head turns (try on compliant surface); gait training with single walking pole; work on increasing speed and excursion of movements; corner balance, corner trunk rotation; progress towards LTGs    Consulted and Agree with Plan of Care Patient           Patient will benefit from skilled therapeutic intervention in order to improve the following deficits and impairments:  Abnormal gait, Difficulty walking, Decreased endurance, Decreased balance, Decreased activity tolerance, Decreased strength, Decreased mobility  Visit Diagnosis: Other lack of coordination  Unsteadiness on feet  Other abnormalities of gait and mobility  Muscle weakness (generalized)     Problem List Patient Active Problem List   Diagnosis Date Noted  . CVA (cerebral vascular accident) ([place]) 07/29/2019  . Dysphagia 07/29/2019  . Left hand weakness 07/29/2019  . Palpitations 09/15/2015  . Diastolic dysfunction 14/78/2956  . Renal cyst, left 06/19/2013  . History of migraine headaches 03/05/2013  . Complicated migraine 21/30/8657  . HLD (hyperlipidemia) 03/05/2013  . 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 Rotator cuff syndrome 03/05/2013  . Low back pain 03/05/2013    Arliss Journey, PT, DPT  11/17/2019, 11:56 AM  [facility] [address] [place], [place], [address] Phone: [phone]   Fax:  [phone]  Name: Alaria Oconnor  Rascon MRN: [id number] Date of Birth: [birth year]

## 2019-11-17 NOTE — Patient Instructions (Addendum)
    Loud AH! 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

## 2019-11-18 ENCOUNTER — Encounter: Payer: Self-pay | Admitting: Adult Health

## 2019-11-18 ENCOUNTER — Ambulatory Visit: Payer: 59 | Admitting: Adult Health

## 2019-11-18 ENCOUNTER — Ambulatory Visit: Payer: 59 | Admitting: Occupational Therapy

## 2019-11-18 VITALS — BP 113/78 | HR 76 | Ht 64.0 in | Wt 160.0 lb

## 2019-11-18 DIAGNOSIS — I639 Cerebral infarction, unspecified: Secondary | ICD-10-CM | POA: Diagnosis not present

## 2019-11-18 DIAGNOSIS — M6281 Muscle weakness (generalized): Secondary | ICD-10-CM | POA: Diagnosis not present

## 2019-11-18 DIAGNOSIS — I69319 Unspecified symptoms and signs involving cognitive functions following cerebral infarction: Secondary | ICD-10-CM

## 2019-11-18 DIAGNOSIS — R278 Other lack of coordination: Secondary | ICD-10-CM

## 2019-11-18 DIAGNOSIS — R2689 Other abnormalities of gait and mobility: Secondary | ICD-10-CM

## 2019-11-18 DIAGNOSIS — R41841 Cognitive communication deficit: Secondary | ICD-10-CM | POA: Diagnosis not present

## 2019-11-18 DIAGNOSIS — R42 Dizziness and giddiness: Secondary | ICD-10-CM | POA: Diagnosis not present

## 2019-11-18 DIAGNOSIS — I69318 Other symptoms and signs involving cognitive functions following cerebral infarction: Secondary | ICD-10-CM

## 2019-11-18 DIAGNOSIS — F0630 Mood disorder due to known physiological condition, unspecified: Secondary | ICD-10-CM

## 2019-11-18 DIAGNOSIS — E785 Hyperlipidemia, unspecified: Secondary | ICD-10-CM

## 2019-11-18 DIAGNOSIS — I69398 Other sequelae of cerebral infarction: Secondary | ICD-10-CM

## 2019-11-18 DIAGNOSIS — R2681 Unsteadiness on feet: Secondary | ICD-10-CM | POA: Diagnosis not present

## 2019-11-18 DIAGNOSIS — R471 Dysarthria and anarthria: Secondary | ICD-10-CM | POA: Diagnosis not present

## 2019-11-18 NOTE — Therapy (Signed)
[facility] [address] [place], [place], [address] Phone: [phone]   Fax:  [phone]  Occupational Therapy Treatment  Patient Details  Name: Tara [name] MRN: [id number] Date of Birth: Jul 05, 1965 No data recorded  Encounter Date: 11/18/2019   OT End of Session - 11/18/19 1038    Visit Number 18    Number of Visits 28    Date for OT Re-Evaluation 12/14/19    Authorization Type [facility]    Authorization Time Period week 4/8    OT Start Time 1033   pt late   OT Stop Time 1058    OT Time Calculation (min) 25 min    Activity Tolerance Patient tolerated treatment well    Behavior During Therapy [facility] for tasks assessed/performed           Past Medical History:  Diagnosis Date  . Asthma   . Hyperlipidemia   . Migraine   . Rotator cuff tear   . Spondylolisthesis    L4-5    Past Surgical History:  Procedure Laterality Date  . BUBBLE STUDY  07/30/2019   Procedure: BUBBLE STUDY;  Surgeon: Donato Heinz, MD;  Location: [place];  Service: Cardiovascular;;  . LOOP RECORDER INSERTION N/A 07/30/2019   Procedure: LOOP RECORDER INSERTION;  Surgeon: Evans Lance, MD;  Location: [place] [facility];  Service: Cardiovascular;  Laterality: N/A;  . No prior surgery    . TEE WITHOUT CARDIOVERSION N/A 07/30/2019   Procedure: TRANSESOPHAGEAL ECHOCARDIOGRAM (TEE);  Surgeon: Donato Heinz, MD;  Location: [facility] ENDOSCOPY;  Service: Cardiovascular;  Laterality: N/A;    There were no vitals filed for this visit.   Subjective Assessment - 11/18/19 1036    Subjective  Pt reports continued numbness in digits    Currently in Pain? Yes    Pain Score 2     Pain Location Finger (Comment which one)    Pain Orientation Left    Pain Descriptors / Indicators Aching    Pain Type Acute pain    Pain Onset More than a month ago    Pain Frequency Intermittent    Aggravating Factors  use    Pain Relieving Factors  unknown               Treatment: Pt arrived late following neurology appt. Typing questions and then generating response to questions, increased time, good accuracy, dizziness 2/ 10. Environmental scanning while walking with walking stick, supervision, pt located all items, improved gait speed today, no LOB.  Dizziness 2-3/10.                   OT Short Term Goals - 10/07/19 1408      OT SHORT TERM GOAL #1   Title Independent with HEP for LUE strengthening (shoulder and hand)    Time 3    Period Weeks    Status Achieved      OT SHORT TERM GOAL #2   Title Grip strength Lt hand to be 45 lbs or greater    Baseline 35 lbs (Rt = 56)    Time 3    Period Weeks    Status On-going   42.5, 46- inconsistent     OT SHORT TERM GOAL #3   Title Pt will increase typing speed to 20 wpm, 90% accuracy in prep for return to work    Time 3    Period Weeks    Status On-going   18 wpm  OT SHORT TERM GOAL #4   Title Pt to perform environmental scanning w/ head turns and no LOB, min dizziness w/ 90% accuracy    Time 3    Period Weeks    Status On-going             OT Long Term Goals - 10/16/19 1718      OT LONG TERM GOAL #1   Title Pt to report less drops Lt hand t/o day    Time 6    Period Weeks    Status On-going   1-2 drops/day     OT LONG TERM GOAL #2   Title Pt to perform cleaning tasks for 20 min w/o rest mod I level    Time 6    Period Weeks    Status On-going   Pt reports cleaning vacuuming 1-2 rooms but then has to rest; pt requires several rest breaks     OT LONG TERM GOAL #3   Title Pt to perform full cooking tasks mod I level safely    Time 6    Period Weeks    Status On-going   not consistently, Pt reports heating somethin up, simple stovetop meal (eggs)     OT LONG TERM GOAL #4   Title Pt to simulate work tasks w/ extra time as required    Time 6    Period Weeks    Status On-going                 Plan - 11/18/19 1215     Clinical Impression Statement Pt is progressing towards goals limited by dizziness.    Occupational performance deficits (Please refer to evaluation for details): IADL's;Work;Leisure    Body Structure / Function / Physical Skills ADL;IADL;Strength;Coordination;FMC;UE functional use;Vision    Cognitive Skills Perception;Problem Solve;Attention    Rehab Potential Good    OT Frequency 2x / week    OT Duration 8 weeks    OT Treatment/Interventions Self-care/ADL training;Therapeutic exercise;Functional Mobility Training;Neuromuscular education;Manual Therapy;Therapeutic activities;Coping strategies training;DME and/or AE instruction;Cognitive remediation/compensation;Visual/perceptual remediation/compensation;Moist Heat;Passive range of motion;Patient/family education    Plan continue to address environmental scanning/ dynamic balance, fine motor coordination    Consulted and Agree with Plan of Care Patient           Patient will benefit from skilled therapeutic intervention in order to improve the following deficits and impairments:   Body Structure / Function / Physical Skills: ADL, IADL, Strength, Coordination, FMC, UE functional use, Vision Cognitive Skills: Perception, Problem Solve, Attention     Visit Diagnosis: Other lack of coordination  Muscle weakness (generalized)  Other abnormalities of gait and mobility  Other symptoms and signs involving cognitive functions following cerebral infarction    Problem List Patient Active Problem List   Diagnosis Date Noted  . CVA (cerebral vascular accident) ([facility]) 07/29/2019  . Dysphagia 07/29/2019  . Left hand weakness 07/29/2019  . Palpitations 09/15/2015  . Diastolic dysfunction 09/15/2015  . Renal cyst, left 06/19/2013  . History of migraine headaches 03/05/2013  . 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 HLD (hyperlipidemia) 03/05/2013  . 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 Rotator cuff syndrome 03/05/2013  . Low back pain 03/05/2013     Allyn Bartelson 11/18/2019, 12:20 PM  [facility] [address] [place], [place], [address] Phone: [phone]   Fax:  [phone]  Name: Tara [name] MRN: [id number] Date of Birth: 01-27-1966

## 2019-11-18 NOTE — Progress Notes (Signed)
[facility] [address] [place]. [place] [address] [phone]       STROKE FOLLOW UP NOTE  [name] Date of Birth:  [birth year] Medical Record Number:  [id number]   Reason for Referral: stroke follow up    CHIEF COMPLAINT:  Chief Complaint  Patient presents with  . Follow-up    corner rm here for a stroke f/u. Pt said she is having numbness tingling and burning in her fingers. Marland Kitchen    HPI:   Today, 11/18/2019, Tara [name] returns for follow-up regarding cryptogenic right parietal stroke in 07/2019.  Residual deficits of mild cognitive impairment, left hand weakness, fatigue with decreased activity tolerance and dizziness/imbalance.  Continues to participate in speech therapy for impairments of attention, processing and visual-spatial skills as well as dysarthria with reported ongoing improvement.  Continues to work with PT for gait training, environmental scanning, and balance and dizziness progressing towards goals.  Continues to work with OT for improvement of LUE fine motor control and coordination and environmental standing/dynamic balance.  She does report ongoing difficulty concentrating especially when having multiple tasks to complete.  Also reports difficulty going into crowded places such as stores as she feels visually overstimulated and starts to feel anxious.  She denies anxiety or depression but based on PHQ-9 of 16 likely underlying depression.  Becomes tearful during visit as she is frustrated with ongoing deficits and feels as though she should have recovered more than what she has.  Reports left hand numbness/tingling located left middle and ring finger with radiation to elbow can wake her up in the middle of the night or become present with increased hand use. 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  Remains on aspirin and Crestor for secondary stroke prevention. Recent lipid  panel by PCP showed LDL 53.  Blood pressure today 113/78.  Loop recorder is not shown atrial fibrillation thus far.  No further concerns at this time.    History copied from prior note for reference purposes only Update 09/29/2019 Dr. Pearlean Brownie ; patient is seen upon request that she call the office requesting medical leave be prolonged as she is having cognitive difficulties and fatigue and dizziness.  She states that she has been getting outpatient physical occupational therapy and but complains of a transient positional dizziness when she looks down or turns her neck quickly.  She is been getting some vestibular therapy which has been helping.  She was recently referred to speech therapy for mental status slowness and progressing with appointment has not happened yet.  She feels overall there is slow and steady progress but she is not ready to return to work yet.  She wants to be out of work till her next assessment with her primary physician Dr. Modesto Charon which is scheduled for June 6.  She has not had any recurrent stroke or TIA symptoms.  She does complain of some intermittent numbness and tightness in her middle 2 fingers in the left hand particularly when she is tired.  She is tolerating aspirin well without bruising or bleeding.  Blood pressures well controlled today it is 116/77.  She had to reduce the dose of Crestor to 20 mg that she had some muscle aches and pains which appear not to have improved.  She does take coenzyme Q 10 as well.  She denies any headache, slurred speech, increasing gait or balance problems.  Initial visit 08/13/2019 JM:  Tara [name] is a 54 year old female who is being seen today for hospital follow-up.  Residual stroke deficits of mild left-sided weakness, dizziness with quick head movements and unsteadiness on feet. She does endorse some improvement but recently just started therapies. She does endorse increased fatigue since discharge and feels like her mind is slower/delayed.  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  She initially had FMLA which expired on 3/15 and she is requesting for extension.  She is eager to continue with therapies for ongoing improvement.  She continues on DAPT but will be completing 3-week Plavix course in the near future.  Denies bleeding or bruising.  She continues on Crestor 40 mg daily but does endorse myalgias bilateral upper extremities and torso.  Blood pressure today 118/88.  Loop recorder has not shown atrial fibrillation thus far.  No further concerns at this time.  Stroke admission 07/29/2019: Tara [name] is a 54 y.o. female with history of HLD, migraine and asthma who presented with HA since 2/28 who woke w/ LUE numbness and weakness, severe dysarthria and dysphagia on 07/29/2019.  Evaluated by stroke team and Dr. Leonie Man with stroke work-up revealing 2 tiny right parietal infarcts embolic secondary to unknown source.  In addition to acute infarcts, MRI also showed several small chronic infarcts in the cerebellum but per review, Dr Leonie Man these were likely flow-voids and not old strokes.  TEE did not show evidence of PFO.  Recommended further evaluation with TCD which was suggestive of medium to large PFO.  Recommended further discussion outpatient regarding possible PFO closure.  Loop recorder placed to rule out atrial fibrillation as possible cause of stroke.  Hypercoagulable work-up negative.  Recommended DAPT for 3 weeks and aspirin alone.  LDL 151 and recommended increasing Crestor frequency to 40 mg daily.  No history of HTN or DM.  Other stroke risk factors include migraines but no prior history of stroke.  Discharged home in stable condition without therapy needs.  Stroke:   2 tiny R parietal  infarcts embolic secondary to unknown source.Marland Kitchen PFO unclear if related to her stroke ROPE score 6 ) 62% chance pfo is related to stroke )  Code Stroke CT head hyperdense R MCA bifurcation. No acute abnormality. R  maxillary sinus dz. ASPECTS 10.     CTA head & neck no LVO. Mild distal SMALL VESSEL DISEASE anterior and posterior circulation. Neck ok   CT perfusion Unremarkable   MRI  2 tiny acute cortical R brain (posterior middle frontal gyrus and posterior R parietal lobe). Read as old cerebellar infarcts but  Dr. Leonie Man feels they are flow voids and not old stroke. Mild paranasal sinus dz.   2D Echo normal   TEE  PFO present  EEG normal   LE dopplers no DVT   LDL 151 mg%  HgbA1c 5.3  UDS not done   TSH normal    ROS:   14 system review of systems performed and negative with exception of fatigue, weakness, dizziness, difficulty in processing, mental slowing, gait impairment depression and anxiety  Depression screen [facility] 2/9 11/18/2019 03/05/2013  Decreased Interest 1 1  Down, Depressed, Hopeless 2 0  PHQ - 2 Score 3 1  Altered sleeping 2 -  Tired, decreased energy 3 -  Change in appetite 1 -  Feeling bad or failure about yourself  2 -  Trouble concentrating 3 -  Moving slowly or fidgety/restless 2 -  Suicidal  thoughts 0 -  PHQ-9 Score 16 -  Difficult doing work/chores Very difficult -   GAD 7 : Generalized Anxiety Score 11/18/2019  Nervous, Anxious, on Edge 1  Control/stop worrying 1  Worry too much - different things 1  Trouble relaxing 1  Restless 1  Easily annoyed or irritable 1  Afraid - awful might happen 1  Total GAD 7 Score 7        PMH:  Past Medical History:  Diagnosis Date  . Asthma   . Hyperlipidemia   . Migraine   . Rotator cuff tear   . Spondylolisthesis    L4-5    PSH:  Past Surgical History:  Procedure Laterality Date  . BUBBLE STUDY  07/30/2019   Procedure: BUBBLE STUDY;  Surgeon: Little IshikawaSchumann, Christopher L, MD;  [facility];  Service: Cardiovascular;;  . LOOP RECORDER INSERTION N/A 07/30/2019   Procedure: LOOP RECORDER INSERTION;  Surgeon: Marinus Mawaylor, Gregg W, MD;  [facility];  Service: Cardiovascular;  Laterality: N/A;    . No prior surgery    . TEE WITHOUT CARDIOVERSION N/A 07/30/2019   Procedure: TRANSESOPHAGEAL ECHOCARDIOGRAM (TEE);  Surgeon: Little IshikawaSchumann, Christopher L, MD;  [facility];  Service: Cardiovascular;  Laterality: N/A;    Social History:  Social History   Socioeconomic History  . Marital status: Married    Spouse name: Not on file  . Number of children: 2  . Years of education: BSN  . Highest education level: Not on file  Occupational History  . Occupation: [employer]  Tobacco Use  . Smoking status: Never Smoker  . Smokeless tobacco: Never Used  Substance and Sexual Activity  . Alcohol use: No    Alcohol/week: 0.0 standard drinks    Comment: Rare  . Drug use: No  . Sexual activity: Yes    Birth control/protection: None  Other Topics Concern  . Not on file  Social History Narrative   Lives at home w/ her husband and son   Right-handed   Caffeine: seldom, soda on the weekend   Social Determinants of Health   Financial Resource Strain:   . Difficulty of Paying Living Expenses:   Food Insecurity:   . Worried About Programme researcher, broadcasting/film/videounning Out of Food in the Last Year:   . Baristaan Out of Food in the Last Year:   Transportation Needs:   . Freight forwarderLack of Transportation (Medical):   Marland Kitchen. Lack of Transportation (Non-Medical):   Physical Activity:   . Days of Exercise per Week:   . Minutes of Exercise per Session:   Stress:   . Feeling of Stress :   Social Connections:   . Frequency of Communication with Friends and Family:   . Frequency of Social Gatherings with Friends and Family:   . Attends Religious Services:   . Active Member of Clubs or Organizations:   . Attends BankerClub or Organization Meetings:   Marland Kitchen. Marital Status:   Intimate Partner Violence:   . Fear of Current or Ex-Partner:   . Emotionally Abused:   Marland Kitchen. Physically Abused:   . Sexually Abused:     Family History:  Family History  Problem Relation Age of Onset  . Heart disease Father 1334       MI  . Hypertension Maternal  Grandmother   . Hyperlipidemia Brother     Medications:   Current Outpatient Medications on File Prior to Visit  Medication Sig Dispense Refill  . aspirin EC 81 MG EC tablet Take 1 tablet (81  mg total) by mouth daily.    . cholecalciferol (VITAMIN D) 1000 UNITS tablet Take 1,000 Units by mouth daily.    . Coenzyme Q10 (COQ10 PO) Take by mouth.    . Omega-3 Fatty Acids (FISH OIL) 500 MG CAPS Natures made Fish oil pearls 500mg  BID (Patient taking differently: Take 1 capsule by mouth daily. Natures made Fish oil pearls 500mg  BID) 180 capsule 1  . Probiotic Product (PROBIOTIC PO) Take 1 capsule by mouth daily.     . rizatriptan (MAXALT) 10 MG tablet Take 10 mg by mouth as needed for migraine (headache).     . rosuvastatin (CRESTOR) 20 MG tablet Take 1 tablet (20 mg total) by mouth daily. 90 tablet 3   No current facility-administered medications on file prior to visit.    Allergies:   Allergies  Allergen Reactions  . Tape Rash     Physical Exam  Vitals:   11/18/19 0918  BP: 113/78  Pulse: 76  Weight: 160 lb (72.6 kg)  Height: 5\' 4"  (1.626 m)   Body mass index is 27.46 kg/m. No exam data present  General: well developed, well nourished, pleasant middle-age female, seated, in no evident distress Head: head normocephalic and atraumatic.   Neck: supple with no carotid or supraclavicular bruits Cardiovascular: regular rate and rhythm, no murmurs Musculoskeletal: no deformity Skin:  no rash/petichiae Vascular:  Normal pulses all extremities   Neurologic Exam Mental Status: Awake and fully alert.   Hypophonia but no evidence of slurred speech or word finding difficulty.  Oriented to place and time. Recent memory impaired and remote memory intact. Attention span, concentration and fund of knowledge appropriate during visit. Mood and affect appropriate.   Cranial Nerves:  Pupils equal, briskly reactive to light. Extraocular movements full without nystagmus. Visual fields full to  confrontation. Hearing intact. Facial sensation intact. Face, tongue, palate moves normally and symmetrically.  Motor: Normal bulk and tone. Normal strength in all tested extremity muscles except mild diminished fine finger movements on the left  Sensory.: slight decreased light touch sensation LUE and LLE compared to right side Coordination: Rapid alternating movements normal in all extremities except decreased left hand. Finger-to-nose and heel-to-shin performed accurately bilaterally. Gait and Station: Arises from chair without difficulty. Stance is normal. Gait demonstrates normal stride length with moderate imbalance Reflexes: 1+ and symmetric. Toes downgoing.       ASSESSMENT: Tara [name] is a 54 y.o. year old female presented with LUE numbness and weakness along with severe dysarthria and dysphagia on 07/29/2019 with stroke work-up revealing 2 tiny right parietal infarcts embolic secondary to unknown source.  Loop recorder placed which has not shown atrial fibrillation thus far.  Also evidence of PFO with unclear relation to stroke.  Vascular risk factors include HLD and migraines.  Mild residual complaints of cognitive difficulties, dizziness/imbalance, fatigue with decreased activity tolerance and decrease left hand functioning with numbness/tingling.  She has of small PFO for which she wants to wait on elective endovascular closure.  She was evaluated for possible participation in Leonie [name] trial unfortunately did not meet criteria   PLAN: -continue to work with therapies for ongoing benefit of residual deficits -moderate depression with PHQ-9 16 which could be contributing to ongoing symptoms and slow recovery. She declines interest in medication use such as with SSRI but declines interest. She is interested in counseling therefore referral placed to neuropsychology for adjustment disorder and depression post stroke -continue to follow with PCP for disability as she is not ready  to return  to work at this time due to difficulty with cognition, imbalance and fatigued with decreased activity level -Recorder will continue to be monitored for possible atrial fibrillation -Continue aspirin and Crestor 20 mg daily for stroke prevention -maintain aggressive risk factor modification with strict control of hypertension blood pressure goal below 130/90, lipids with LDL cholesterol goal below 70 mg percent and diabetes with hemoglobin A1c goal below 6.5%.    Follow-up in 3 months or call earlier if needed   I spent 35 minutes of face-to-face and non-face-to-face time with patient.  This included previsit chart review, lab review, study review, order entry, electronic health record documentation, patient education regarding cryptogenic stroke, long discussion regarding residual deficits and poststroke depression, importance of managing stroke risk factors and answered all questions to patient satisfaction   Ihor Austin, [facility]  [facility] [address] [place], [place] [address]  Phone [phone] Fax [phone] Note: This document was prepared with digital dictation and possible smart phrase technology. Any transcriptional errors that result from this process are unintentional.

## 2019-11-18 NOTE — Patient Instructions (Signed)
Referral placed to neuro psychology for post stroke depression and anxiety that could be causing/contributing to come of your symptoms  Consider use of antidepressant medications if you do continue to experience difficulty with depression   Continue to work with outpatient therapies for ongoing improvement  Continue aspirin 81 mg daily  and Crestor  for secondary stroke prevention  Continue to follow up with PCP regarding cholesterol and blood pressure management   Continue to monitor blood pressure at home  Maintain strict control of hypertension with blood pressure goal below 130/90, diabetes with hemoglobin A1c goal below 6.5% and cholesterol with LDL cholesterol (bad cholesterol) goal below 70 mg/dL. I also advised the patient to eat a healthy diet with plenty of whole grains, cereals, fruits and vegetables, exercise regularly and maintain ideal body weight.  Followup in the future with me in 3 months or call earlier if needed       Thank you for coming to see [place] at [facility]. I hope we have been able to provide you high quality care today.  You may receive a patient satisfaction survey over the next few weeks. We would appreciate your feedback and comments so that we may continue to improve ourselves and the health of our patients.

## 2019-11-18 NOTE — Progress Notes (Signed)
I agree with the above plan 

## 2019-11-19 ENCOUNTER — Other Ambulatory Visit: Payer: Self-pay

## 2019-11-19 ENCOUNTER — Ambulatory Visit: Payer: 59 | Admitting: Occupational Therapy

## 2019-11-19 ENCOUNTER — Ambulatory Visit: Payer: 59 | Admitting: Adult Health

## 2019-11-19 DIAGNOSIS — M6281 Muscle weakness (generalized): Secondary | ICD-10-CM | POA: Diagnosis not present

## 2019-11-19 DIAGNOSIS — R471 Dysarthria and anarthria: Secondary | ICD-10-CM | POA: Diagnosis not present

## 2019-11-19 DIAGNOSIS — R2689 Other abnormalities of gait and mobility: Secondary | ICD-10-CM

## 2019-11-19 DIAGNOSIS — R2681 Unsteadiness on feet: Secondary | ICD-10-CM

## 2019-11-19 DIAGNOSIS — R278 Other lack of coordination: Secondary | ICD-10-CM

## 2019-11-19 DIAGNOSIS — I69318 Other symptoms and signs involving cognitive functions following cerebral infarction: Secondary | ICD-10-CM

## 2019-11-19 DIAGNOSIS — R42 Dizziness and giddiness: Secondary | ICD-10-CM | POA: Diagnosis not present

## 2019-11-19 DIAGNOSIS — R41841 Cognitive communication deficit: Secondary | ICD-10-CM | POA: Diagnosis not present

## 2019-11-19 NOTE — Therapy (Signed)
[facility] [address] [place], [place], [address] Phone: [phone]   Fax:  [phone]  Occupational Therapy Treatment  Patient Details  Name: Tara [name] MRN: [id number] Date of Birth: 10-13-1965 No data recorded  Encounter Date: 11/19/2019   OT End of Session - 11/19/19 1518    Visit Number 19    Number of Visits 28    Date for OT Re-Evaluation 12/14/19    Authorization Type [facility]    Authorization Time Period week 4/8    OT Start Time 1454   Pt late   OT Stop Time 1528    OT Time Calculation (min) 34 min    Activity Tolerance Patient tolerated treatment well    Behavior During Therapy [facility] for tasks assessed/performed           Past Medical History:  Diagnosis Date  . Asthma   . Hyperlipidemia   . Migraine   . Rotator cuff tear   . Spondylolisthesis    L4-5    Past Surgical History:  Procedure Laterality Date  . BUBBLE STUDY  07/30/2019   Procedure: BUBBLE STUDY;  Surgeon: [name], MD;  Location: [facility] ENDOSCOPY;  Service: Cardiovascular;;  . LOOP RECORDER INSERTION N/A 07/30/2019   Procedure: LOOP RECORDER INSERTION;  Surgeon: [name], MD;  Location: [facility] INVASIVE CV LAB;  Service: Cardiovascular;  Laterality: N/A;  . No prior surgery    . TEE WITHOUT CARDIOVERSION N/A 07/30/2019   Procedure: TRANSESOPHAGEAL ECHOCARDIOGRAM (TEE);  Surgeon: [name], MD;  Location: [facility] ENDOSCOPY;  Service: Cardiovascular;  Laterality: N/A;    There were no vitals filed for this visit.   Subjective Assessment - 11/19/19 1515    Subjective  Pt reports continued numbness in digits    Currently in Pain? Yes    Pain Score 2     Pain Location Finger (Comment which one)    Pain Orientation Left    Pain Descriptors / Indicators Aching    Pain Onset More than a month ago    Pain Frequency Intermittent    Aggravating Factors  use    Pain Relieving Factors unknown                  Treatment: simple cooking task to locate items and cook a scrambled egg, pt performed modified I, demonstrating good safety awareness. Pt reports dizziness 2-3 at end of task.  Placing grooved pegs into pegboard with LUE and removing with in hand manipulation for increased fine motor coordination,. Arm bike x 6 mins level 3 for conditioning Word search with word list to right side for head and eye movement, min v.c                 OT Short Term Goals - 10/07/19 1408      OT SHORT TERM GOAL #1   Title Independent with HEP for LUE strengthening (shoulder and hand)    Time 3    Period Weeks    Status Achieved      OT SHORT TERM GOAL #2   Title Grip strength Lt hand to be 45 lbs or greater    Baseline 35 lbs (Rt = 56)    Time 3    Period Weeks    Status On-going   42.5, 46- inconsistent     OT SHORT TERM GOAL #3   Title Pt will increase typing speed to 20 wpm, 90% accuracy in prep for return  to work    Time 3    Period Weeks    Status On-going   18 wpm     OT SHORT TERM GOAL #4   Title Pt to perform environmental scanning w/ head turns and no LOB, min dizziness w/ 90% accuracy    Time 3    Period Weeks    Status On-going             OT Long Term Goals - 11/19/19 1520      OT LONG TERM GOAL #1   Title Pt to report less drops Lt hand t/o day    Time 6    Period Weeks    Status On-going   1-2 drops/day     OT LONG TERM GOAL #2   Title Pt to perform cleaning tasks for 20 min w/o rest mod I level    Time 6    Period Weeks    Status On-going   Pt reports cleaning vacuuming 1-2 rooms but then has to rest; pt requires several rest breaks     OT LONG TERM GOAL #3   Title Pt to perform full cooking tasks mod I level safely    Time 6    Period Weeks    Status On-going   Pt cooked a scrambled egg mod I     OT LONG TERM GOAL #4   Title Pt to simulate work tasks w/ extra time as required    Time 6    Period Weeks    Status On-going                  Plan - 11/19/19 1519    Clinical Impression Statement Pt demonstrated good safety awaness with simple cooking activity.    Occupational performance deficits (Please refer to evaluation for details): IADL's;Work;Leisure    Body Structure / Function / Physical Skills ADL;IADL;Strength;Coordination;FMC;UE functional use;Vision    Cognitive Skills Perception;Problem Solve;Attention    Rehab Potential Good    OT Frequency 2x / week    OT Duration 8 weeks    OT Treatment/Interventions Self-care/ADL training;Therapeutic exercise;Functional Mobility Training;Neuromuscular education;Manual Therapy;Therapeutic activities;Coping strategies training;DME and/or AE instruction;Cognitive remediation/compensation;Visual/perceptual remediation/compensation;Moist Heat;Passive range of motion;Patient/family education    Plan continue to address environmental scanning/ dynamic balance, fine motor coordination    Consulted and Agree with Plan of Care Patient           Patient will benefit from skilled therapeutic intervention in order to improve the following deficits and impairments:   Body Structure / Function / Physical Skills: ADL, IADL, Strength, Coordination, FMC, UE functional use, Vision Cognitive Skills: Perception, Problem Solve, Attention     Visit Diagnosis: Other lack of coordination  Muscle weakness (generalized)  Other symptoms and signs involving cognitive functions following cerebral infarction  Other abnormalities of gait and mobility  Unsteadiness on feet    Problem List Patient Active Problem List   Diagnosis Date Noted  . CVA (cerebral vascular accident) ([facility]) 07/29/2019  . Dysphagia 07/29/2019  . Left hand weakness 07/29/2019  . Palpitations 09/15/2015  . Diastolic dysfunction 35/32/9924  . Renal cyst, left 06/19/2013  . History of migraine headaches 03/05/2013  . Complicated migraine 26/83/4196  . HLD (hyperlipidemia) 03/05/2013  . Asthmatic  bronchitis 03/05/2013  . Rotator cuff syndrome 03/05/2013  . Low back pain 03/05/2013    Alicja Everitt 11/19/2019, 3:21 PM  [facility] [address] [place], [place], [address] Phone: [phone]   Fax:  [phone]  Name: Tara [name] MRN: [id number] Date of Birth: Jul 31, 1965

## 2019-11-24 ENCOUNTER — Ambulatory Visit: Payer: 59 | Admitting: Speech Pathology

## 2019-11-24 ENCOUNTER — Ambulatory Visit: Payer: 59 | Admitting: Occupational Therapy

## 2019-11-24 ENCOUNTER — Encounter: Payer: Self-pay | Admitting: Speech Pathology

## 2019-11-24 ENCOUNTER — Other Ambulatory Visit: Payer: Self-pay

## 2019-11-24 DIAGNOSIS — I69318 Other symptoms and signs involving cognitive functions following cerebral infarction: Secondary | ICD-10-CM

## 2019-11-24 DIAGNOSIS — R278 Other lack of coordination: Secondary | ICD-10-CM

## 2019-11-24 DIAGNOSIS — R2689 Other abnormalities of gait and mobility: Secondary | ICD-10-CM | POA: Diagnosis not present

## 2019-11-24 DIAGNOSIS — R2681 Unsteadiness on feet: Secondary | ICD-10-CM | POA: Diagnosis not present

## 2019-11-24 DIAGNOSIS — R41841 Cognitive communication deficit: Secondary | ICD-10-CM | POA: Diagnosis not present

## 2019-11-24 DIAGNOSIS — M6281 Muscle weakness (generalized): Secondary | ICD-10-CM | POA: Diagnosis not present

## 2019-11-24 DIAGNOSIS — R42 Dizziness and giddiness: Secondary | ICD-10-CM | POA: Diagnosis not present

## 2019-11-24 DIAGNOSIS — R471 Dysarthria and anarthria: Secondary | ICD-10-CM | POA: Diagnosis not present

## 2019-11-24 NOTE — Patient Instructions (Addendum)
   With internal distractions your thoughts racing, trying to be perfect and this is distracting you from doing a task  Be aware of internal physical distractions (hunger, thirst, voiding) - try to complete the task you are on before getting a drink or snack  Keep a notebook, write down what else you need to do, then check the notebook during the day - add your non-priorities, to help stop the thought from spinning in your brain. Cross them off after you are completed.   Consider asking about medication for attention (adderal or ritalin or something like that)  Good job using Alexa  Knowing that some tasks will take you longer and plan accordingly  Mindfulness   Breaking down longer tasks into smaller tasks is OK, including prayers  You look good on the outside, so people will forget that your brain has been injured and is healing  Your brain will heal in it's own time. We can't wish a broken arm to heal faster, and we can't wish your brain to heal faster  Allow yourself 1 1/2 hour to get ready for church - you  Micro-changes in your brain don't show up on a MRI or CT, but there is micro damage   Clutter does hurt your brain after you have had a stroke or BI - we usually have people reduce clutter

## 2019-11-24 NOTE — Therapy (Signed)
[facility] [address] [place], [place], [address] Phone: [phone]   Fax:  [phone]  Occupational Therapy Treatment  Patient Details  Name: Tara [name] MRN: [id number] Date of Birth: [birth year] No data recorded  Encounter Date: 11/24/2019   OT End of Session - 11/24/19 1240    Visit Number 20    Number of Visits 28    Date for OT Re-Evaluation 12/14/19    Authorization Type [facility]    Authorization Time Period week 6/8    OT Start Time 1235    OT Stop Time 1315    OT Time Calculation (min) 40 min    Activity Tolerance Patient tolerated treatment well    Behavior During Therapy [facility] for tasks assessed/performed           Past Medical History:  Diagnosis Date  . Asthma   . Hyperlipidemia   . Migraine   . Rotator cuff tear   . Spondylolisthesis    L4-5    Past Surgical History:  Procedure Laterality Date  . BUBBLE STUDY  07/30/2019   Procedure: BUBBLE STUDY;  Surgeon: [name], MD;  Location: [facility] ENDOSCOPY;  Service: Cardiovascular;;  . LOOP RECORDER INSERTION N/A 07/30/2019   Procedure: LOOP RECORDER INSERTION;  Surgeon: [name], MD;  Location: [facility] INVASIVE CV LAB;  Service: Cardiovascular;  Laterality: N/A;  . No prior surgery    . TEE WITHOUT CARDIOVERSION N/A 07/30/2019   Procedure: TRANSESOPHAGEAL ECHOCARDIOGRAM (TEE);  Surgeon: [name], MD;  Location: [facility] ENDOSCOPY;  Service: Cardiovascular;  Laterality: N/A;    There were no vitals filed for this visit.   Subjective Assessment - 11/24/19 1238    Subjective  Pt reports continued numbness in digits    Pertinent History CVA 07/29/19. PMH: HLD, migraines, asthma, small chronic cerebellar infarcts    Limitations fall risk, loop recorder, ? swallowing precautions (chin tuck)    Patient Stated Goals typing, cooking, cleaning, driving, working    Currently in Pain? Yes    Pain Score 2     Pain Location --   long  and ring fingers   Pain Orientation Left    Pain Descriptors / Indicators Aching;Tingling;Burning;Numbness    Pain Type Acute pain    Pain Radiating Towards elbow to hand    Pain Onset More than a month ago    Pain Frequency Intermittent    Aggravating Factors  use    Pain Relieving Factors unknown           Copying phone #'s from Lt to Rt side of page with 1 error. Mazes x 2 for visual scanning and looking/planning ahead w/ initial difficulty but able I'ly.  Connect the dot for visual scanning with extra time required and looking up occasionally d/t slight increase in dizziness. Pt with decreased visual endurance.  Another 42M print size word search (simple words - all horizontal Lt to Rt) with increase in dizziness to 3/10 - pt found items fairly efficiently.   Typing phone numbers on computer for work simulation, visual scanning, and up/down head turns - pt with compensations, extra time required, and 2 self corrected errors - 1 error not identified.  UBE x 5 min, level 3 at end of session for conditioning                       OT Short Term Goals - 10/07/19 1408      OT  SHORT TERM GOAL #1   Title Independent with HEP for LUE strengthening (shoulder and hand)    Time 3    Period Weeks    Status Achieved      OT SHORT TERM GOAL #2   Title Grip strength Lt hand to be 45 lbs or greater    Baseline 35 lbs (Rt = 56)    Time 3    Period Weeks    Status On-going   42.5, 46- inconsistent     OT SHORT TERM GOAL #3   Title Pt will increase typing speed to 20 wpm, 90% accuracy in prep for return to work    Time 3    Period Weeks    Status On-going   18 wpm     OT SHORT TERM GOAL #4   Title Pt to perform environmental scanning w/ head turns and no LOB, min dizziness w/ 90% accuracy    Time 3    Period Weeks    Status On-going             OT Long Term Goals - 11/19/19 1520      OT LONG TERM GOAL #1   Title Pt to report less drops Lt hand t/o day    Time  6    Period Weeks    Status On-going   1-2 drops/day     OT LONG TERM GOAL #2   Title Pt to perform cleaning tasks for 20 min w/o rest mod I level    Time 6    Period Weeks    Status On-going   Pt reports cleaning vacuuming 1-2 rooms but then has to rest; pt requires several rest breaks     OT LONG TERM GOAL #3   Title Pt to perform full cooking tasks mod I level safely    Time 6    Period Weeks    Status On-going   Pt cooked a scrambled egg mod I     OT LONG TERM GOAL #4   Title Pt to simulate work tasks w/ extra time as required    Time 6    Period Weeks    Status On-going                 Plan - 11/24/19 1314    Clinical Impression Statement Pt progressing with visual scanning with less overall dizziness    Occupational performance deficits (Please refer to evaluation for details): IADL's;Work;Leisure    Body Structure / Function / Physical Skills ADL;IADL;Strength;Coordination;FMC;UE functional use;Vision    Cognitive Skills Perception;Problem Solve;Attention    Rehab Potential Good    OT Frequency 2x / week    OT Duration 8 weeks    OT Treatment/Interventions Self-care/ADL training;Therapeutic exercise;Functional Mobility Training;Neuromuscular education;Manual Therapy;Therapeutic activities;Coping strategies training;DME and/or AE instruction;Cognitive remediation/compensation;Visual/perceptual remediation/compensation;Moist Heat;Passive range of motion;Patient/family education    Plan continue to address environmental scanning/ dynamic balance, fine motor coordination    Consulted and Agree with Plan of Care Patient           Patient will benefit from skilled therapeutic intervention in order to improve the following deficits and impairments:   Body Structure / Function / Physical Skills: ADL, IADL, Strength, Coordination, FMC, UE functional use, Vision Cognitive Skills: Perception, Problem Solve, Attention     Visit Diagnosis: Other lack of  coordination  Muscle weakness (generalized)  Other symptoms and signs involving cognitive functions following cerebral infarction    Problem List Patient Active Problem List  Diagnosis Date Noted  . CVA (cerebral vascular accident) ([place]) 07/29/2019  . Dysphagia 07/29/2019  . Left hand weakness 07/29/2019  . Palpitations 09/15/2015  . Diastolic dysfunction 03/00/9233  . Renal cyst, left 06/19/2013  . History of migraine headaches 03/05/2013  . Complicated migraine 00/76/2263  . HLD (hyperlipidemia) 03/05/2013  . 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 Rotator cuff syndrome 03/05/2013  . Low back pain 03/05/2013    Carey Bullocks, OTR/L 11/24/2019, 1:15 PM  [facility] [address] [place], [place], [address] Phone: [phone]   Fax:  [phone]  Name: REBBECA SHEPERD MRN: [id number] Date of Birth: 09-Nov-1965

## 2019-11-24 NOTE — Therapy (Signed)
[facility] [address] [place], [place], [address] Phone: [phone]   Fax:  [phone]  Speech Language Pathology Treatment  Patient Details  Name: Tara [name] MRN: [id number] Date of Birth: November 12, 1965 Referring Provider (SLP): Frann Rider NP   Encounter Date: 11/24/2019   End of Session - 11/24/19 1519    Visit Number 13    Number of Visits 17    Date for SLP Re-Evaluation 11/26/19    SLP Start Time 8916    SLP Stop Time  1401    SLP Time Calculation (min) 44 min    Activity Tolerance Patient tolerated treatment well           Past Medical History:  Diagnosis Date  . Asthma   . Hyperlipidemia   . Migraine   . Rotator cuff tear   . Spondylolisthesis    L4-5    Past Surgical History:  Procedure Laterality Date  . BUBBLE STUDY  07/30/2019   Procedure: BUBBLE STUDY;  Surgeon: Donato Heinz, MD;  Location: [place];  Service: Cardiovascular;;  . LOOP RECORDER INSERTION N/A 07/30/2019   Procedure: LOOP RECORDER INSERTION;  Surgeon: Evans Lance, MD;  Location: [place] CV LAB;  Service: Cardiovascular;  Laterality: N/A;  . No prior surgery    . TEE WITHOUT CARDIOVERSION N/A 07/30/2019   Procedure: TRANSESOPHAGEAL ECHOCARDIOGRAM (TEE);  Surgeon: Donato Heinz, MD;  Location: [facility] ENDOSCOPY;  Service: Cardiovascular;  Laterality: N/A;    There were no vitals filed for this visit.   Subjective Assessment - 11/24/19 1323    Subjective "The same"    Currently in Pain? Yes    Pain Score 2     Pain Location Finger (Comment which one)    Pain Orientation Left    Pain Descriptors / Indicators Aching;Tingling;Burning    Pain Type Acute pain                 ADULT SLP TREATMENT - 11/24/19 1327      General Information   Behavior/Cognition Alert;Cooperative;Pleasant mood      Treatment Provided   Treatment provided Cognitive-Linquistic      Cognitive-Linquistic  Treatment   Treatment focused on Cognition;Patient/family/caregiver education    Skilled Treatment Pt reports leaving tasks uncompleted due to distractions, primarily internal distractions. Instructed pt to use her legal pad to write down repetitive thoughts, worries and non -priority to do's.  Also trained her in strategy of waiting until a task is complete before getting up for a snack or drink. She verbalizes difficulty getting to appointments and church on time. When asked, Florine stated that it takes her longer to get ready. with cues, she stated she used to get ready for church in 1 hour, but now it takes 1 1/2 hours. She has difficulty leaving the kitchen to get ready unless it is spotless. We discussed using alexa to remind her to get ready for church 1.5 hours before it starts and to try to leave the kitchen at that time. Her husband has become frustrated with her tardiness.       Assessment / Recommendations / Plan   Plan Continue with current plan of care      Progression Toward Goals   Progression toward goals Progressing toward goals            SLP Education - 11/24/19 1517    Education Details compensations for internal distractions and planning extra time to get ready as she  is slower now    Person(s) Educated Patient    Methods Explanation;Handout;Verbal cues    Comprehension Verbalized understanding;Returned demonstration;Verbal cues required;Need further instruction            SLP Short Term Goals - 11/24/19 1518      SLP SHORT TERM GOAL #1   Title Pt will utiltize external aids to schedule and complete daily tasks with rare min A over 3 sessions    Baseline 10-28-19, 10-29-19    Status Partially Met      SLP SHORT TERM GOAL #2   Title Pt will uitlize compensatory strategies to attend to and process verbal instructions and converstion accurately with rare min A over 2 sessions    Baseline 10-28-19, 10-29-19    Status On-going      SLP SHORT TERM GOAL #3   Title Pt  will read 1 paragraph (5-7 sentences) and recall details with rare min A over 2 sessions    Baseline 10-28-19    Status Achieved            SLP Long Term Goals - 11/24/19 1519      SLP LONG TERM GOAL #1   Title Pt will utilize compensations for attention to prepare a main dish for 3 meals with rare min A    Baseline 10-28-19, 10-29-19, 11-05-19    Status Achieved      SLP LONG TERM GOAL #2   Title Pt will carryover compensatory strategies for attention and processing to run 3 errands in a row successfully with rare min A    Baseline 11-07-19, 11-11-19    Time 1    Period Weeks    Status On-going      SLP LONG TERM GOAL #3   Title Pt will return to ADL/IADL task after a distraction and succesfully complete task before starting a new activity with rare min A over 3 sessions    Baseline 11-05-19, 6-11-216-15-21    Status Achieved      SLP LONG TERM GOAL #4   Title pt will incr voice volume in 8 minuts simple conversation to low 70s in 2 sessions    Time 1    Period Weeks    Status On-going            Plan - 11/24/19 1518    Clinical Impression Statement Shaheen continues to present with at least mild but improving cognitive linguistic impairments of attention, processing, and visuospatial skills. See "skilled intervention" for more details. Pt  incr'd number of scheduled chores each day to 3-4, and dinner - she will be monitoring her stamina level. Ongoing training of compensations for cogntive impairments for successful completion of IADL's, intelligiblilty. Continue skilled ST    Speech Therapy Frequency 2x / week    Duration --   8 weeks or 17 visits   Potential to Achieve Goals Good           Patient will benefit from skilled therapeutic intervention in order to improve the following deficits and impairments:   Cognitive communication deficit    Problem List Patient Active Problem List   Diagnosis Date Noted  . CVA (cerebral vascular accident) ([place]) 07/29/2019  .  Dysphagia 07/29/2019  . Left hand weakness 07/29/2019  . Palpitations 09/15/2015  . Diastolic dysfunction 23/76/2831  . Renal cyst, left 06/19/2013  . History of migraine headaches 03/05/2013  . Complicated migraine 51/76/1607  . HLD (hyperlipidemia) 03/05/2013  . 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 Rotator cuff syndrome  03/05/2013  . Low back pain 03/05/2013    Dora Simeone, Annye Rusk MS, CCC-SLP 11/24/2019, 3:20 PM  [facility] [address] [place], [place], [address] Phone: [phone]   Fax:  [phone]   Name: KARON COTTERILL MRN: [id number] Date of Birth: 1965/07/22

## 2019-11-26 ENCOUNTER — Ambulatory Visit: Payer: 59 | Admitting: Physical Therapy

## 2019-11-26 ENCOUNTER — Ambulatory Visit: Payer: 59 | Admitting: Occupational Therapy

## 2019-11-26 ENCOUNTER — Encounter: Payer: Self-pay | Admitting: Physical Therapy

## 2019-11-26 ENCOUNTER — Ambulatory Visit: Payer: 59 | Admitting: Speech Pathology

## 2019-11-26 ENCOUNTER — Other Ambulatory Visit: Payer: Self-pay

## 2019-11-26 DIAGNOSIS — M6281 Muscle weakness (generalized): Secondary | ICD-10-CM

## 2019-11-26 DIAGNOSIS — R278 Other lack of coordination: Secondary | ICD-10-CM

## 2019-11-26 DIAGNOSIS — R2681 Unsteadiness on feet: Secondary | ICD-10-CM

## 2019-11-26 DIAGNOSIS — R471 Dysarthria and anarthria: Secondary | ICD-10-CM

## 2019-11-26 DIAGNOSIS — R2689 Other abnormalities of gait and mobility: Secondary | ICD-10-CM

## 2019-11-26 DIAGNOSIS — I69318 Other symptoms and signs involving cognitive functions following cerebral infarction: Secondary | ICD-10-CM | POA: Diagnosis not present

## 2019-11-26 DIAGNOSIS — R42 Dizziness and giddiness: Secondary | ICD-10-CM | POA: Diagnosis not present

## 2019-11-26 DIAGNOSIS — R41841 Cognitive communication deficit: Secondary | ICD-10-CM | POA: Diagnosis not present

## 2019-11-26 NOTE — Therapy (Addendum)
[facility] [address] [place], [place], [address] Phone: [phone]   Fax:  [phone]  Physical Therapy Treatment/Re-Cert  Patient Details  Name: Tara [name] MRN: [id number] Date of Birth: 11-Oct-1965 No data recorded  Encounter Date: 11/26/2019   PT End of Session - 11/26/19 1723    Visit Number 22    Number of Visits 24    Authorization Type [facility]    PT Start Time 1405    PT Stop Time 1444    PT Time Calculation (min) 39 min    Activity Tolerance Patient tolerated treatment well;Other (comment)   limited by dizziness   Behavior During Therapy [facility] for tasks assessed/performed           Past Medical History:  Diagnosis Date  . Asthma   . Hyperlipidemia   . Migraine   . Rotator cuff tear   . Spondylolisthesis    L4-5    Past Surgical History:  Procedure Laterality Date  . BUBBLE STUDY  07/30/2019   Procedure: BUBBLE STUDY;  Surgeon: Donato Heinz, MD;  Location: [place];  Service: Cardiovascular;;  . LOOP RECORDER INSERTION N/A 07/30/2019   Procedure: LOOP RECORDER INSERTION;  Surgeon: Evans Lance, MD;  Location: [place] CV LAB;  Service: Cardiovascular;  Laterality: N/A;  . No prior surgery    . TEE WITHOUT CARDIOVERSION N/A 07/30/2019   Procedure: TRANSESOPHAGEAL ECHOCARDIOGRAM (TEE);  Surgeon: Donato Heinz, MD;  Location: [facility] ENDOSCOPY;  Service: Cardiovascular;  Laterality: N/A;    There were no vitals filed for this visit.   Subjective Assessment - 11/26/19 1408    Subjective Bought her walking stick, reports it is better. Went to a pharmacy and was visually overstimulated. Still gets visually overwhelmed from being in stores.    Patient Stated Goals Pt's goals for therapy are to strengthen to help with the tired feeling.              [facility] PT Assessment - 11/27/19 0001      Assessment   Medical Diagnosis CVA    Onset Date/Surgical Date 07/29/19        Prior Function   Level of Independence Independent                           11/26/19 1416  Transfers  Sit to Stand 5: Supervision;Without upper extremity assist;From chair/3-in-1;With upper extremity assist  Five time sit to stand comments  1st sit <> stand took 3 seconds and 5x sit <> stand = 31.19 seconds, intermittent UE support, at times uncontrolled descent, 2nd rep of 5x sit <> stand with no UE support = 31.1 seconds, pt reporting 1/10 dizziness after each rep  Stand to Sit 5: Supervision;Without upper extremity assist;To chair/3-in-1;With upper extremity assist  Ambulation/Gait  Ambulation/Gait Yes  Ambulation/Gait Assistance 5: Supervision;4: Min guard  Ambulation/Gait Assistance Details clinic distances and for DGI  Ambulation Distance (Feet)  (clinic distances)  Assistive device Other (Comment) (walking stick)  Gait Pattern Step-through pattern;Decreased arm swing - right;Decreased step length - right;Decreased stride length;Decreased trunk rotation;Wide base of support  Ambulation Surface Level;Outdoor  Gait velocity 24.53 seconds = 1.33 ft/sec   Stairs Yes  Stairs Assistance 5: Supervision  Stairs Assistance Details (indicate cue type and reason) takes incr time when performing, does not look down  Stair Management Technique Two rails;Alternating pattern;Forwards  Number of Stairs 4  Height of Stairs 6  Dynamic Gait Index  Level Surface 2  Change in Gait Speed 1  Gait with Horizontal Head Turns 1  Gait with Vertical Head Turns 2  Gait and Pivot Turn 1 (rating 2-3/10 dizziness afterwards)  Step Over Obstacle 1  Step Around Obstacles 2  Steps 2  Total Score 12  DGI comment: performed with walking stick, reports at most 2/10 dizziness, pt needing a seated rest break towards end        PT Education - 11/27/19 0810    Education Details results of LTGs, extending POC for an additional 1x week for 2 weeks to review and finalize HEP and check  remaining LTG    Person(s) Educated Patient    Methods Explanation    Comprehension Verbalized understanding            PT Short Term Goals - 09/10/19 0944      PT SHORT TERM GOAL #1   Title Pt will be independent with HEP for improved strength, balance, and gait.  TARGET for all STGs:  4 weeks:  09/05/2019    Time 4    Period Weeks    Status Achieved      PT SHORT TERM GOAL #2   Title Pt will improve 5x sit<>stand to less than or equal to 15 seconds for improved functional lower extremity strength.    Baseline 21.68 seconds on 09/03/19    Time 4    Period Weeks    Status Not Met      PT SHORT TERM GOAL #3   Title Pt will improve DGI score to at least 16/24 for decresaed fall risk.    Baseline 10/24 on 09/10/19    Time 4    Period Weeks    Status Not Met      PT SHORT TERM GOAL #4   Title Pt will verbalize understanding of:  fall prevention in home environment and CVA warning signs/symptoms    Time 4    Period Weeks    Status Achieved             PT Long Term Goals - 11/26/19 1414      PT LONG TERM GOAL #1   Title Pt will be independent with progression of HEP to address strength, balance, gait, and vestibular impairments.  TARGET 11/26/2019 for all LTGs    Baseline pt will continue to benefit from further revisions/additions to HEP    Time 5    Period Weeks    Status On-going      PT LONG TERM GOAL #2   Title Pt perform sit>stand in less than 5 seconds for improved transfers and initiation of gait.    Baseline 38.9 seconds with no UE support from standard chair, 39.97 sec 10/20/2019; 1st sit<>stand takes >9 seconds, 1st sit <> stand took 3 seconds and 5x sit <> stand = 31.19 seconds on 11/26/19    Time 5    Period Weeks    Status Achieved      PT LONG TERM GOAL #3   Title Pt will improve DGI score to at least 15/24 for decreased fall risk and improved functional mobility.    Baseline 9/24 on 09/25/19; 12/24 10/20/2019, 12/24 on 11/26/19    Time 5    Period Weeks      Status Not Met      PT LONG TERM GOAL #4   Title Pt will ambulate at least 500 ft, indoor and outdoor surfaces, with supervision and no LOB  for return to independent community gait.    Baseline 230 ft indoors 10/20/2019    Time 5    Period Weeks    Status On-going      PT LONG TERM GOAL #5   Title Pt will improve gait speed to at least 2 ft/sec for improved gait efficiency and decr fall risk.    Baseline 1.57 ft/sec on 09/25/19; 1.75 ft/sec, 24.53 seconds = 1.33 ft/sec  using walking stick on 11/26/19    Time 5    Period Weeks    Status Not Met            Revised/on-going LTGs for re-cert:      PT Long Term Goals - 11/27/19 0825      PT LONG TERM GOAL #1   Title Pt will be independent with progression of HEP to address strength, balance, gait, and vestibular impairments.  TARGET 12/11/19 for all LTGs    Baseline pt will continue to benefit from further revisions/additions to HEP    Time 2    Period Weeks    Status On-going    Target Date 12/11/19      PT LONG TERM GOAL #2   Title Pt will ambulate at least 500 ft, indoor and outdoor surfaces, with supervision and no LOB for return to independent community gait.    Baseline 230 ft indoors 10/20/2019    Time 2    Period Weeks    Status New      PT LONG TERM GOAL #3   Title Pt will improve gait speed to at least 1.6 ft/sec with walking stick for improved gait efficiency and decr fall risk.    Baseline 1.57 ft/sec on 09/25/19; 1.75 ft/sec, 24.53 seconds = 1.33 ft/sec  using walking stick on 11/26/19    Time 2    Period Weeks    Status New      PT LONG TERM GOAL #4   Title --    Baseline --    Time --    Period --    Status --      PT LONG TERM GOAL #5   Title --    Baseline --    Time --    Period --    Status --              Plan - 11/27/19 0821    Clinical Impression Statement Focus of today's skilled session was assessing pt's LTGs. Did not get to assess all LTGs today due to time constraints. Pt met LTG  #2 in regards to sit <> stand, able to perform 1st sit <> stand with no UE support in 3 seconds (previously was 9 seconds), and had an improvement in 5x sit <> stand time, pt able to perform today in 31.19 seconds (previously 39.97 seconds). Performed DGI today with walking stick with pt scoring a 12/24 (no change when last assessed), indicating that pt is still at a high risk for falls. Assessed gait speed today with walking stick with pt performing in 1.33 ft/sec and did not meet LTG #5 and pt demonstrating a slower gait speed than when last assessed, indicating that pt is at a recurrent fall risk. However, pt does demonstrate improved LUE arm swing and improved gait mechanics with use of walking stick. Discussed with pt continuing POC for an additional 1x week for 2 weeks to check remaining LTGs and to finalize HEP due to pt making slight/no improvement from since goals were last checked.  Personal Factors and Comorbidities Comorbidity 3+    Comorbidities PMH:  asthma, hyperlipidemia, spondyloslisthesis, R RTC tear    Examination-Activity Limitations Locomotion Level;Transfers;Stand;Stairs    Examination-Participation Restrictions Community Activity;Yard Work;Other    Stability/Clinical Decision Making Evolving/Moderate complexity    Clinical Decision Making Moderate    Rehab Potential Good    PT Frequency 1x / week    PT Duration 2 weeks    PT Treatment/Interventions ADLs/Self Care Home Management;Gait training;Stair training;Functional mobility training;Therapeutic activities;Therapeutic exercise;Balance training;Neuromuscular re-education;DME Instruction;Patient/family education;Electrical Stimulation;Orthotic Fit/Training;Vestibular    PT Next Visit Plan check remaining LTGs and review HEP. D/C at end of next POC.   NuStep/SciFit for movement.           Patient will benefit from skilled therapeutic intervention in order to improve the following deficits and impairments:  Abnormal gait,  Difficulty walking, Decreased endurance, Decreased balance, Decreased activity tolerance, Decreased strength, Decreased mobility  Visit Diagnosis: Other lack of coordination  Muscle weakness (generalized)  Other symptoms and signs involving cognitive functions following cerebral infarction  Other abnormalities of gait and mobility  Unsteadiness on feet     Problem List Patient Active Problem List   Diagnosis Date Noted  . CVA (cerebral vascular accident) ([place]) 07/29/2019  . Dysphagia 07/29/2019  . Left hand weakness 07/29/2019  . Palpitations 09/15/2015  . Diastolic dysfunction 73/57/8978  . Renal cyst, left 06/19/2013  . History of migraine headaches 03/05/2013  . Complicated migraine 47/84/1282  . HLD (hyperlipidemia) 03/05/2013  . 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 Rotator cuff syndrome 03/05/2013  . Low back pain 03/05/2013    Arliss Journey , PT, DPT  11/27/2019, 8:23 AM  [facility] [address] [place], [place], [address] Phone: [phone]   Fax:  [phone]  Name: KIYLA RINGLER MRN: [id number] Date of Birth: 06/21/1965

## 2019-11-26 NOTE — Therapy (Signed)
[facility] [address] [place], [place], [address] Phone: [phone]   Fax:  [phone]  Speech Language Pathology Treatment  Patient Details  Name: Tara [name] MRN: [id number] Date of Birth: 1965-07-07 Referring Provider (SLP): Ihor Austin NP   Encounter Date: 11/26/2019   End of Session - 11/26/19 1456    Visit Number 14    Number of Visits 17    Date for SLP Re-Evaluation 11/26/19    SLP Start Time 1315    SLP Stop Time  1357    SLP Time Calculation (min) 42 min           Past Medical History:  Diagnosis Date  . Asthma   . Hyperlipidemia   . Migraine   . Rotator cuff tear   . Spondylolisthesis    L4-5    Past Surgical History:  Procedure Laterality Date  . BUBBLE STUDY  07/30/2019   Procedure: BUBBLE STUDY;  Surgeon: [name], MD;  Location: [facility] ENDOSCOPY;  Service: Cardiovascular;;  . LOOP RECORDER INSERTION N/A 07/30/2019   Procedure: LOOP RECORDER INSERTION;  Surgeon: [name], MD;  Location: [facility] INVASIVE CV LAB;  Service: Cardiovascular;  Laterality: N/A;  . No prior surgery    . TEE WITHOUT CARDIOVERSION N/A 07/30/2019   Procedure: TRANSESOPHAGEAL ECHOCARDIOGRAM (TEE);  Surgeon: [name], MD;  Location: [facility] ENDOSCOPY;  Service: Cardiovascular;  Laterality: N/A;    There were no vitals filed for this visit.   Subjective Assessment - 11/26/19 1324    Subjective "It is so hot"    Currently in Pain? Yes    Pain Score 3     Pain Location Finger (Comment which one)                 ADULT SLP TREATMENT - 11/26/19 1325      General Information   Behavior/Cognition Alert;Cooperative;Pleasant mood      Treatment Provided   Treatment provided Cognitive-Linquistic      Cognitive-Linquistic Treatment   Treatment focused on Cognition;Patient/family/caregiver education    Skilled Treatment Pt reports repeating "finish the dishes, finish the dishes" in  her head when she got distracted by an empty paper towel hoder. She verbalizes that "I caught myself" getting distracted.  She left the package of paper towels on the floor yesterday after refilling the holder. We generate strategy of scanning the room before leaving and before bed. Error awareness and attention to detail correcting menu errors      Assessment / Recommendations / Plan   Plan Continue with current plan of care            SLP Education - 11/26/19 1452    Education Details compensations for attention, processing and memory    Person(s) Educated Patient    Methods Explanation;Verbal cues;Handout    Comprehension Verbalized understanding;Verbal cues required            SLP Short Term Goals - 11/26/19 1455      SLP SHORT TERM GOAL #1   Title Pt will utiltize external aids to schedule and complete daily tasks with rare min A over 3 sessions    Baseline 10-28-19, 10-29-19    Status Partially Met      SLP SHORT TERM GOAL #2   Title Pt will uitlize compensatory strategies to attend to and process verbal instructions and converstion accurately with rare min A over 2 sessions    Baseline 10-28-19, 10-29-19    Status  On-going      SLP SHORT TERM GOAL #3   Title Pt will read 1 paragraph (5-7 sentences) and recall details with rare min A over 2 sessions    Baseline 10-28-19    Status Achieved            SLP Long Term Goals - 11/26/19 1455      SLP LONG TERM GOAL #1   Title Pt will utilize compensations for attention to prepare a main dish for 3 meals with rare min A    Baseline 10-28-19, 10-29-19, 11-05-19    Status Achieved      SLP LONG TERM GOAL #2   Title Pt will carryover compensatory strategies for attention and processing to run 3 errands in a row successfully with rare min A    Baseline 11-07-19, 11-11-19    Time 1    Period Weeks    Status Achieved      SLP LONG TERM GOAL #3   Title Pt will return to ADL/IADL task after a distraction and succesfully complete task  before starting a new activity with rare min A over 3 sessions    Baseline 11-05-19, 6-11-216-15-21    Status Achieved      SLP LONG TERM GOAL #4   Title pt will incr voice volume in 8 minuts simple conversation to low 70s in 2 sessions    Time 1    Period Weeks    Status On-going            Plan - 11/26/19 1453    Clinical Impression Statement Tara [name] continues to present with at least mild but improving cognitive linguistic impairments of attention, processing, and visuospatial skills. See "skilled intervention" for more details. Pt  incr'd number of scheduled chores each day to 3-4, and dinner - she will be monitoring her stamina level. Ongoing training of compensations for cogntive impairments for successful completion of IADL's, intelligiblilty. Continue skilled ST    Speech Therapy Frequency 2x / week    Duration --   8 weeks or 17 visits   Treatment/Interventions Language facilitation;Environmental controls;Cueing hierarchy;SLP instruction and feedback;Compensatory strategies;Functional tasks;Cognitive reorganization;Patient/family education;Multimodal communcation approach;Internal/external aids;Compensatory techniques    Potential to Achieve Goals Good           Patient will benefit from skilled therapeutic intervention in order to improve the following deficits and impairments:   Cognitive communication deficit  Dysarthria and anarthria    Problem List Patient Active Problem List   Diagnosis Date Noted  . CVA (cerebral vascular accident) ([place]) 07/29/2019  . Dysphagia 07/29/2019  . Left hand weakness 07/29/2019  . Palpitations 09/15/2015  . Diastolic dysfunction 63/14/9702  . Renal cyst, left 06/19/2013  . History of migraine headaches 03/05/2013  . Complicated migraine 63/78/5885  . HLD (hyperlipidemia) 03/05/2013  . 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 Rotator cuff syndrome 03/05/2013  . Low back pain 03/05/2013    Bailey Kolbe, Annye Rusk MS, CCC-SLP 11/26/2019,  2:58 PM  [facility] [address] [place], [place], [address] Phone: [phone]   Fax:  [phone]   Name: Tara [name] MRN: [id number] Date of Birth: [birth year]

## 2019-11-26 NOTE — Therapy (Signed)
[facility] [address] [place], [place], [address] Phone: [phone]   Fax:  [phone]  Occupational Therapy Treatment  Patient Details  Name: Tara [name] MRN: [id number] Date of Birth: 11/09/1965 No data recorded  Encounter Date: 11/26/2019   OT End of Session - 11/26/19 1505    Visit Number 21    Number of Visits 28    Date for OT Re-Evaluation 12/14/19    Authorization Type [facility]    Authorization Time Period week 6/8    OT Start Time 1230    OT Stop Time 1315    OT Time Calculation (min) 45 min    Activity Tolerance Patient tolerated treatment well    Behavior During Therapy [facility] for tasks assessed/performed           Past Medical History:  Diagnosis Date  . Asthma   . Hyperlipidemia   . Migraine   . Rotator cuff tear   . Spondylolisthesis    L4-5    Past Surgical History:  Procedure Laterality Date  . BUBBLE STUDY  07/30/2019   Procedure: BUBBLE STUDY;  Surgeon: Donato Heinz, MD;  Location: [facility];  Service: Cardiovascular;;  . LOOP RECORDER INSERTION N/A 07/30/2019   Procedure: LOOP RECORDER INSERTION;  Surgeon: Evans Lance, MD;  Location: [place] CV LAB;  Service: Cardiovascular;  Laterality: N/A;  . No prior surgery    . TEE WITHOUT CARDIOVERSION N/A 07/30/2019   Procedure: TRANSESOPHAGEAL ECHOCARDIOGRAM (TEE);  Surgeon: Donato Heinz, MD;  Location: [facility] ENDOSCOPY;  Service: Cardiovascular;  Laterality: N/A;    There were no vitals filed for this visit.   Subjective Assessment - 11/26/19 1236    Pertinent History CVA 07/29/19. PMH: HLD, migraines, asthma, small chronic cerebellar infarcts    Limitations fall risk, loop recorder, ? swallowing precautions (chin tuck)    Patient Stated Goals typing, cooking, cleaning, driving, working    Currently in Pain? Yes    Pain Score 3     Pain Location --   Long and ring finger   Pain Orientation Left    Pain  Descriptors / Indicators Tingling;Burning;Numbness    Pain Type Acute pain    Pain Onset More than a month ago    Pain Frequency Intermittent    Aggravating Factors  use    Pain Relieving Factors unknown          Discussed plans for d/c next session and began assessing remaining ST and LTG's (see goal section). Pt now typing 20 wpm at 97% accuracy but this is not yet consistent. Pt performed environmental scanning today finding 9/12 items (75% accuracy) missing 3 on Rt side (2 upper Rt quadrant) d/t decreased head turns and decreased up/down motion.  Discussed plans for long term disability as pt is not ready for work demands due to decreased mental endurance, ability to id and correct errors, internal distractions, dizziness, and cognitive deficits. Discussed recommendations upon d/c including: continuing computer activities at home (typing, copying (both words and numbers), checking and responding to emails, etc), word searches, IADLS (2-3 chores per day including: laundry, cleaning, cooking, grocery shopping with sup), continuing use of pill box and use of Alexa and notebook for memory strategies, etc.                         OT Short Term Goals - 11/26/19 1505      OT SHORT TERM GOAL #1  Title Independent with HEP for LUE strengthening (shoulder and hand)    Time 3    Period Weeks    Status Achieved      OT SHORT TERM GOAL #2   Title Grip strength Lt hand to be 45 lbs or greater    Baseline 35 lbs (Rt = 56)    Time 3    Period Weeks    Status Achieved   47 lbs     OT SHORT TERM GOAL #3   Title Pt will increase typing speed to 20 wpm, 90% accuracy in prep for return to work    Time 3    Period Weeks    Status Partially Met   20 wpm 97% accuracy - however not consistent     OT SHORT TERM GOAL #4   Title Pt to perform environmental scanning w/ head turns and no LOB, min dizziness w/ 90% accuracy    Time 3    Period Weeks    Status Not Met   75%-85%              OT Long Term Goals - 11/26/19 1507      OT LONG TERM GOAL #1   Title Pt to report less drops Lt hand t/o day    Time 6    Period Weeks    Status On-going   1-2 drops/day     OT LONG TERM GOAL #2   Title Pt to perform cleaning tasks for 20 min w/o rest mod I level    Time 6    Period Weeks    Status Partially Met   Pt reports she is able to perform cleaning tasks for 20 minutes but may require 1-2 rest breaks     OT LONG TERM GOAL #3   Title Pt to perform full cooking tasks mod I level safely    Time 6    Period Weeks    Status Achieved   met with rest breaks     OT LONG TERM GOAL #4   Title Pt to simulate work tasks w/ extra time as required    Time 6    Period Weeks    Status On-going                 Plan - 11/26/19 1508    Clinical Impression Statement Began assessing remaining goals today in prep for d/c - see goal section for updates    Occupational performance deficits (Please refer to evaluation for details): IADL's;Work;Leisure    Body Structure / Function / Physical Skills ADL;IADL;Strength;Coordination;FMC;UE functional use;Vision    Cognitive Skills Perception;Problem Solve;Attention    OT Frequency 2x / week    OT Duration 8 weeks    OT Treatment/Interventions Self-care/ADL training;Therapeutic exercise;Functional Mobility Training;Neuromuscular education;Manual Therapy;Therapeutic activities;Coping strategies training;DME and/or AE instruction;Cognitive remediation/compensation;Visual/perceptual remediation/compensation;Moist Heat;Passive range of motion;Patient/family education    Plan assess remaining goals and d/c    Consulted and Agree with Plan of Care Patient           Patient will benefit from skilled therapeutic intervention in order to improve the following deficits and impairments:   Body Structure / Function / Physical Skills: ADL, IADL, Strength, Coordination, FMC, UE functional use, Vision Cognitive Skills: Perception, Problem  Solve, Attention     Visit Diagnosis: Other lack of coordination  Muscle weakness (generalized)  Other symptoms and signs involving cognitive functions following cerebral infarction    Problem List Patient Active Problem List  Diagnosis Date Noted  . CVA (cerebral vascular accident) ([place]) 07/29/2019  . Dysphagia 07/29/2019  . Left hand weakness 07/29/2019  . Palpitations 09/15/2015  . Diastolic dysfunction 40/12/6759  . Renal cyst, left 06/19/2013  . History of migraine headaches 03/05/2013  . Complicated migraine 95/01/3266  . HLD (hyperlipidemia) 03/05/2013  . 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 Rotator cuff syndrome 03/05/2013  . Low back pain 03/05/2013    Carey Bullocks, OTR/L 11/26/2019, 3:17 PM  [facility] [address] [place], [place], [address] Phone: [phone]   Fax:  [phone]  Name: ARTELIA GAME MRN: [id number] Date of Birth: 04/10/1966

## 2019-11-26 NOTE — Patient Instructions (Addendum)
   Good job mentally repeating "finish the dishes" to help keep you on task when you got distracted  Mental repetition can also help you remember when you leave a room to go get something  Keep up the good work planning your priorities for the day and week   Keep up the good work using your legal pad to write down your internal distractions and repetitive worries to free up space and energy in your brain  Rest in between chores in a quiet darker room to allow your brain to reset before starting another task  Good job breaking up making dinner and doing some dinner prep earlier in the day  Listening or participating in a group conversation or a long conversation may result in fatigue and you likely aren't getting all of the information. Limit social calls and visits to 20 min or less when you can or need to  Since your stroke, you need to plan where you spend your energy and use your energy on things that are priorities. If you push one day, you may be taking energy from the next day.  When you are out in public (party, restaurant, church meetings etc) come up with a signal with your husband if you need to leave or end a conversation

## 2019-11-27 NOTE — Addendum Note (Signed)
Addended by: Drake Leach on: 11/27/2019 08:28 AM   Modules accepted: Orders

## 2019-12-02 ENCOUNTER — Ambulatory Visit: Payer: 59 | Admitting: Occupational Therapy

## 2019-12-03 ENCOUNTER — Encounter: Payer: Self-pay | Admitting: Physical Therapy

## 2019-12-03 ENCOUNTER — Ambulatory Visit: Payer: 59 | Attending: Family Medicine | Admitting: Physical Therapy

## 2019-12-03 ENCOUNTER — Other Ambulatory Visit: Payer: Self-pay

## 2019-12-03 DIAGNOSIS — R278 Other lack of coordination: Secondary | ICD-10-CM | POA: Insufficient documentation

## 2019-12-03 DIAGNOSIS — I69318 Other symptoms and signs involving cognitive functions following cerebral infarction: Secondary | ICD-10-CM | POA: Diagnosis present

## 2019-12-03 DIAGNOSIS — R2681 Unsteadiness on feet: Secondary | ICD-10-CM | POA: Diagnosis not present

## 2019-12-03 DIAGNOSIS — R471 Dysarthria and anarthria: Secondary | ICD-10-CM | POA: Insufficient documentation

## 2019-12-03 DIAGNOSIS — R2689 Other abnormalities of gait and mobility: Secondary | ICD-10-CM | POA: Insufficient documentation

## 2019-12-03 DIAGNOSIS — M6281 Muscle weakness (generalized): Secondary | ICD-10-CM | POA: Diagnosis present

## 2019-12-03 DIAGNOSIS — R41841 Cognitive communication deficit: Secondary | ICD-10-CM | POA: Insufficient documentation

## 2019-12-03 NOTE — Therapy (Signed)
[facility] [address] [place], [place], [address] Phone: [phone]   Fax:  [phone]  Physical Therapy Treatment  Patient Details  Name: Tara [name] MRN: [id number] Date of Birth: [birth year] No data recorded  Encounter Date: 12/03/2019   PT End of Session - 12/03/19 1706    Visit Number 23    Number of Visits 24    Authorization Type [facility]    PT Start Time 1618    PT Stop Time 1703    PT Time Calculation (min) 45 min    Equipment Utilized During Treatment Gait belt    Activity Tolerance Patient tolerated treatment well;Other (comment)   limited by dizziness   Behavior During Therapy [facility] for tasks assessed/performed           Past Medical History:  Diagnosis Date  . Asthma   . Hyperlipidemia   . Migraine   . Rotator cuff tear   . Spondylolisthesis    L4-5    Past Surgical History:  Procedure Laterality Date  . BUBBLE STUDY  07/30/2019   Procedure: BUBBLE STUDY;  Surgeon: Donato Heinz, MD;  Location: [place];  Service: Cardiovascular;;  . LOOP RECORDER INSERTION N/A 07/30/2019   Procedure: LOOP RECORDER INSERTION;  Surgeon: Evans Lance, MD;  Location: [facility] CV LAB;  Service: Cardiovascular;  Laterality: N/A;  . No prior surgery    . TEE WITHOUT CARDIOVERSION N/A 07/30/2019   Procedure: TRANSESOPHAGEAL ECHOCARDIOGRAM (TEE);  Surgeon: Donato Heinz, MD;  Location: [facility] ENDOSCOPY;  Service: Cardiovascular;  Laterality: N/A;    There were no vitals filed for this visit.   Subjective Assessment - 12/03/19 1622    Subjective Nothing really new. Family visited over the weekend. Was tired and slept a lot yesterday. [place] [employer] [place] over the [place] and was able to go to 2 stores.    Patient Stated Goals Pt's goals for therapy are to strengthen to help with the tired feeling.    Currently in Pain? Yes    Pain Score 1     Pain Location Finger (Comment which one)      Pain Orientation Left    Pain Descriptors / Indicators Tingling;Burning;Numbness                  Access Code: [email or web address] URL: [email or web address] Date: 12/03/2019 Prepared by: Janann August  Began reviewing pt's HEP (only had time for bolded exercises below)  Exercises Sit to Stand - 1-2 x daily - 5 x weekly - 3 sets - 5 reps Standing Marching - 1-2 x daily - 5 x weekly - 3 sets  - with countertop support, cues to hold for 3 seconds for incr SLS  Tandem Stance - 1 x daily - 5 x weekly - 3 sets - 20 hold - in corner with chair anteriorly, and using fingertips intermittently for balance  Seated Nose to Left Knee Vestibular Habituation - 2 x daily - 5 x weekly - 1 sets - 3-4 reps Seated Nose to Right Knee Vestibular Habituation - 2 x daily - 5 x weekly - 1 sets - 3-4 reps -cues for incr speed when performing habituation  Vestibular Habituation - Seated Horizontal Head Rotation - 1 x daily - 5 x weekly - 3 sets - 5 reps Seated Head Nods Vestibular Habituation - 1 x daily - 5 x weekly - 3 sets - 5 reps Lateral Weight Shift with Arm Raise - 1 x daily -  5 x weekly - 1 sets - 10 reps Standing Quarter Turn with Counter Support - 1 x daily - 5 x weekly - 1 sets - 5 reps Standing Gaze Stabilization with Head Rotation - 1-2 x daily - 5 x weekly - 3 sets - 10 reps Standing Balance with Eyes Closed on Foam - 1 x daily - 5 x weekly - 3 sets - 15 hold - wide BOS on single pillow, using chair intermittently for balance - UPGRADED EXERCISE from level ground to compliant surface     Verbally added side stepping, forward/back walking, forward walk with head turns at counter to try at home                [facility] Adult PT Treatment/Exercise - 12/03/19 0001      Transfers   Comments 2 x 5 reps, 2/10 dizziness after 1st rep, 1/10 after 2nd, cues for forward weight shift and nose over toes, from standard chair with no UE support      Ambulation/Gait   Ambulation/Gait  Yes    Ambulation/Gait Assistance 5: Supervision    Ambulation/Gait Assistance Details outdoors over grass and paved surfaces plus indoor level surfaces throughout session. pt using walking stick and demonstrating improved gait mechanics with L arm swing. no LOB over uneven surfaces outdoors    Ambulation Distance (Feet) 500 Feet    Assistive device Other (Comment)   single walking pole   Gait Pattern Step-through pattern;Decreased trunk rotation;Wide base of support    Ambulation Surface Level;Unlevel;Indoor;Outdoor;Paved;Grass                  PT Education - 12/03/19 1706    Education Details progress towards goals, began reviewing HEP    Person(s) Educated Patient    Methods Explanation;Demonstration;Verbal cues    Comprehension Verbalized understanding;Returned demonstration            PT Short Term Goals - 09/10/19 0944      PT SHORT TERM GOAL #1   Title Pt will be independent with HEP for improved strength, balance, and gait.  TARGET for all STGs:  4 weeks:  09/05/2019    Time 4    Period Weeks    Status Achieved      PT SHORT TERM GOAL #2   Title Pt will improve 5x sit<>stand to less than or equal to 15 seconds for improved functional lower extremity strength.    Baseline 21.68 seconds on 09/03/19    Time 4    Period Weeks    Status Not Met      PT SHORT TERM GOAL #3   Title Pt will improve DGI score to at least 16/24 for decresaed fall risk.    Baseline 10/24 on 09/10/19    Time 4    Period Weeks    Status Not Met      PT SHORT TERM GOAL #4   Title Pt will verbalize understanding of:  fall prevention in home environment and CVA warning signs/symptoms    Time 4    Period Weeks    Status Achieved             PT Long Term Goals - 12/03/19 1709      PT LONG TERM GOAL #1   Title Pt will be independent with progression of HEP to address strength, balance, gait, and vestibular impairments.  TARGET 12/11/19 for all LTGs    Baseline pt will continue to  benefit from further revisions/additions to HEP  Time 2    Period Weeks    Status On-going      PT LONG TERM GOAL #2   Title Pt will ambulate at least 500 ft, indoor and outdoor surfaces, with supervision and no LOB for return to independent community gait.    Baseline met on 12/03/19 with use of walking stick    Time 2    Period Weeks    Status Achieved      PT LONG TERM GOAL #3   Title Pt will improve gait speed to at least 1.6 ft/sec with walking stick for improved gait efficiency and decr fall risk.    Baseline 1.57 ft/sec on 09/25/19; 1.75 ft/sec, 24.53 seconds = 1.33 ft/sec  using walking stick on 11/26/19    Time 2    Period Weeks    Status New                 Plan - 12/03/19 1711    Clinical Impression Statement Pt met LTG #2 in regards to gait over unlevel surfaces with walking stick, pt able to perform gait with supervision outdoors on unleve surfaces with no LOB and no incr dizziness. Remainder of session focused on beginning to review and finalize pt's HEP. Pt able to perform with initial cues at times. Upgraded standing corner balance with eyes closed to on a compliant surface with wide BOS for incr vestibular input, with pt reporting no incr in dizziness. Will finish assessing LTGs at next session and finalizing HEP and anticipate D/C.    Personal Factors and Comorbidities Comorbidity 3+    Comorbidities PMH:  asthma, hyperlipidemia, spondyloslisthesis, R RTC tear    Examination-Activity Limitations Locomotion Level;Transfers;Stand;Stairs    Examination-Participation Restrictions Community Activity;Yard Work;Other    Stability/Clinical Decision Making Evolving/Moderate complexity    Rehab Potential Good    PT Frequency 1x / week    PT Duration 2 weeks    PT Treatment/Interventions ADLs/Self Care Home Management;Gait training;Stair training;Functional mobility training;Therapeutic activities;Therapeutic exercise;Balance training;Neuromuscular re-education;DME  Instruction;Patient/family education;Electrical Stimulation;Orthotic Fit/Training;Vestibular    PT Next Visit Plan check remaining LTGs and review/finalize HEP. anticipate D/C.           Patient will benefit from skilled therapeutic intervention in order to improve the following deficits and impairments:  Abnormal gait, Difficulty walking, Decreased endurance, Decreased balance, Decreased activity tolerance, Decreased strength, Decreased mobility  Visit Diagnosis: Other lack of coordination  Muscle weakness (generalized)  Other symptoms and signs involving cognitive functions following cerebral infarction  Other abnormalities of gait and mobility  Unsteadiness on feet     Problem List Patient Active Problem List   Diagnosis Date Noted  . CVA (cerebral vascular accident) ([place]) 07/29/2019  . Dysphagia 07/29/2019  . Left hand weakness 07/29/2019  . Palpitations 09/15/2015  . Diastolic dysfunction 94/76/5465  . Renal cyst, left 06/19/2013  . History of migraine headaches 03/05/2013  . Complicated migraine 03/54/6568  . HLD (hyperlipidemia) 03/05/2013  . 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 Rotator cuff syndrome 03/05/2013  . Low back pain 03/05/2013    Arliss Journey, PT, DPT  12/03/2019, 5:13 PM  [facility] [address] [place], [place], [address] Phone: [phone]   Fax:  [phone]  Name: Tara [name] MRN: [id number] Date of Birth: 07/28/1965

## 2019-12-03 NOTE — Patient Instructions (Signed)
Access Code: [email or web address] URL: [email or web address] Date: 12/03/2019 Prepared by: Sherlie Ban  Exercises Sit to Stand - 1-2 x daily - 5 x weekly - 3 sets - 5 reps Standing Marching - 1-2 x daily - 5 x weekly - 3 sets Tandem Stance - 1 x daily - 5 x weekly - 3 sets - 20 hold Seated Nose to Left Knee Vestibular Habituation - 2 x daily - 5 x weekly - 1 sets - 3-4 reps Seated Nose to Right Knee Vestibular Habituation - 2 x daily - 5 x weekly - 1 sets - 3-4 reps Vestibular Habituation - Seated Horizontal Head Rotation - 1 x daily - 5 x weekly - 3 sets - 5 reps Seated Head Nods Vestibular Habituation - 1 x daily - 5 x weekly - 3 sets - 5 reps Lateral Weight Shift with Arm Raise - 1 x daily - 5 x weekly - 1 sets - 10 reps Standing Quarter Turn with Counter Support - 1 x daily - 5 x weekly - 1 sets - 5 reps Standing Gaze Stabilization with Head Rotation - 1-2 x daily - 5 x weekly - 3 sets - 10 reps Standing Balance with Eyes Closed on Foam - 1 x daily - 5 x weekly - 3 sets - 15 hold

## 2019-12-04 ENCOUNTER — Ambulatory Visit: Payer: 59 | Admitting: Occupational Therapy

## 2019-12-04 ENCOUNTER — Encounter: Payer: Self-pay | Admitting: Occupational Therapy

## 2019-12-04 DIAGNOSIS — M6281 Muscle weakness (generalized): Secondary | ICD-10-CM

## 2019-12-04 DIAGNOSIS — R2681 Unsteadiness on feet: Secondary | ICD-10-CM

## 2019-12-04 DIAGNOSIS — R2689 Other abnormalities of gait and mobility: Secondary | ICD-10-CM

## 2019-12-04 DIAGNOSIS — I69318 Other symptoms and signs involving cognitive functions following cerebral infarction: Secondary | ICD-10-CM

## 2019-12-04 DIAGNOSIS — R471 Dysarthria and anarthria: Secondary | ICD-10-CM | POA: Diagnosis not present

## 2019-12-04 DIAGNOSIS — R278 Other lack of coordination: Secondary | ICD-10-CM | POA: Diagnosis not present

## 2019-12-04 DIAGNOSIS — R41841 Cognitive communication deficit: Secondary | ICD-10-CM | POA: Diagnosis not present

## 2019-12-04 NOTE — Therapy (Signed)
[facility] [address] [place], [place], [address] Phone: [phone]   Fax:  [phone]  Occupational Therapy Treatment  Patient Details  Name: Tara [name] MRN: [id number] Date of Birth: 1965-10-24 No data recorded  Encounter Date: 12/04/2019   OT End of Session - 12/04/19 1442    Visit Number 22    Number of Visits 28    Date for OT Re-Evaluation 12/14/19    Authorization Type [facility]    OT Start Time 1151    OT Stop Time 1230    OT Time Calculation (min) 39 min    Activity Tolerance Patient tolerated treatment well    Behavior During Therapy [facility] for tasks assessed/performed           Past Medical History:  Diagnosis Date  . Asthma   . Hyperlipidemia   . Migraine   . Rotator cuff tear   . Spondylolisthesis    L4-5    Past Surgical History:  Procedure Laterality Date  . BUBBLE STUDY  07/30/2019   Procedure: BUBBLE STUDY;  Surgeon: Donato Heinz, MD;  Location: [facility];  Service: Cardiovascular;;  . LOOP RECORDER INSERTION N/A 07/30/2019   Procedure: LOOP RECORDER INSERTION;  Surgeon: Evans Lance, MD;  Location: [facility];  Service: Cardiovascular;  Laterality: N/A;  . No prior surgery    . TEE WITHOUT CARDIOVERSION N/A 07/30/2019   Procedure: TRANSESOPHAGEAL ECHOCARDIOGRAM (TEE);  Surgeon: Donato Heinz, MD;  Location: [facility];  Service: Cardiovascular;  Laterality: N/A;    There were no vitals filed for this visit.              Treatment: Therapist discussed d/c recommendations with patient. Enviromental scanning(basic) with 100% accuracy at reduced speed. Dynamic functional reaching with LUE to retrieve graded clothespins from low surface to place on antennae, min v.c  Patient was encouraged to gradually increase her activity level at home.           OT Education - 12/04/19 1441    Education Details Discharge recommendations see pt  instructions, therapist reveiwed with pt.    Person(s) Educated Patient    Methods Explanation;Demonstration;Verbal cues;Handout    Comprehension Verbalized understanding;Returned demonstration;Verbal cues required            OT Short Term Goals - 11/26/19 1505      OT SHORT TERM GOAL #1   Title Independent with HEP for LUE strengthening (shoulder and hand)    Time 3    Period Weeks    Status Achieved      OT SHORT TERM GOAL #2   Title Grip strength Lt hand to be 45 lbs or greater    Baseline 35 lbs (Rt = 56)    Time 3    Period Weeks    Status Achieved   47 lbs     OT SHORT TERM GOAL #3   Title Pt will increase typing speed to 20 wpm, 90% accuracy in prep for return to work    Time 3    Period Weeks    Status Partially Met   20 wpm 97% accuracy - however not consistent     OT SHORT TERM GOAL #4   Title Pt to perform environmental scanning w/ head turns and no LOB, min dizziness w/ 90% accuracy    Time 3    Period Weeks    Status Not Met   75%-85%  OT Long Term Goals - 12/04/19 1645      OT LONG TERM GOAL #1   Title Pt to report less drops Lt hand t/o day    Time 6    Period Weeks    Status Partially Met   1-2 drops/day     OT LONG TERM GOAL #2   Title Pt to perform cleaning tasks for 20 min w/o rest mod I level    Time 6    Period Weeks    Status Partially Met   Pt reports she is able to perform cleaning tasks for 20 minutes but may require 1-2 rest breaks     OT LONG TERM GOAL #3   Title Pt to perform full cooking tasks mod I level safely    Time 6    Period Weeks    Status Achieved   met with rest breaks     OT LONG TERM GOAL #4   Title Pt to simulate work tasks w/ extra time as required    Time 6    Period Weeks    Status Not Met   not fully met due to visual fatigue, dizziness and cognitve deficits.Pt is able to return emails in a quiet environment however she does not demonstrate the endurance to fully complete work activities.                 Plan - 12/04/19 1443    Clinical Impression Statement Therapist discussed plans for d/c with patient. Patient was encouraged to continue to work on recovery at home and she was provided with a list of activities to perfrom. Pt will continue to work with ST to address cognitve deficits.    Occupational performance deficits (Please refer to evaluation for details): IADL's;Work;Leisure    Body Structure / Function / Physical Skills ADL;IADL;Strength;Coordination;FMC;UE functional use;Vision    Cognitive Skills Perception;Problem Solve;Attention    OT Frequency 2x / week    OT Duration 8 weeks    OT Treatment/Interventions Self-care/ADL training;Therapeutic exercise;Functional Mobility Training;Neuromuscular education;Manual Therapy;Therapeutic activities;Coping strategies training;DME and/or AE instruction;Cognitive remediation/compensation;Visual/perceptual remediation/compensation;Moist Heat;Passive range of motion;Patient/family education    Plan discharge OT    Consulted and Agree with Plan of Care --           Patient will benefit from skilled therapeutic intervention in order to improve the following deficits and impairments:   Body Structure / Function / Physical Skills: ADL, IADL, Strength, Coordination, FMC, UE functional use, Vision Cognitive Skills: Perception, Problem Solve, Attention     Visit Diagnosis: Other lack of coordination  Muscle weakness (generalized)  Other symptoms and signs involving cognitive functions following cerebral infarction  Other abnormalities of gait and mobility  Unsteadiness on feet   OCCUPATIONAL THERAPY DISCHARGE SUMMARY    Current functional level related to goals / functional outcomes: Patient made progress overall yet she did not fully meet all goals to due to decreased mental endurance,decreased ability to identify and correct errors, internal distractions, dizziness, and cognitive deficits.    Remaining  deficits: Cognitive deficits, decreased balance, sensory impairment   Education / Equipment: Pt was provided with education regarding: HEP and discharge recommendations. Therapist also recommends pt considers long term disability as she is not ready to return to work due to:  decreased mental endurance,decreased ability to identify and correct errors, internal distractions, dizziness, and cognitive deficits.   Pt's progress with OT has plateaued as pt is so fearful of movement and concerns that it will make her dizzy.  Pt may benefit from counseling, and pt reports that her NP recommended. Pt continues to complain of numbness in her hand, she was encouraged to discuss with her MD. Pt verbalizes understanding of all recommendations and plans for d/c. Pt to continue with ST to address cognitive deficits. Plan: Patient agrees to discharge.  Patient goals were partially met. Patient is being discharged due to lack of progress.  ????? Pt's progress plateaued at this time.     Problem List Patient Active Problem List   Diagnosis Date Noted  . CVA (cerebral vascular accident) ([place]) 07/29/2019  . Dysphagia 07/29/2019  . Left hand weakness 07/29/2019  . Palpitations 09/15/2015  . Diastolic dysfunction 16/38/4536  . Renal cyst, left 06/19/2013  . History of migraine headaches 03/05/2013  . Complicated migraine 46/80/3212  . HLD (hyperlipidemia) 03/05/2013  . 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 Rotator cuff syndrome 03/05/2013  . Low back pain 03/05/2013    Mase Dhondt 12/04/2019, 4:47 PM Theone Murdoch, OTR/L [phone] Phone: [phone] 5:00 PM 12/04/19 [facility] [address] [place], [place], [address] Phone: [phone]   Fax:  [phone]  Name: ROZANN HOLTS MRN: [id number] Date of Birth: 1966/01/07

## 2019-12-04 NOTE — Patient Instructions (Addendum)
Consider pursuing long term disability due to decreased mental endurance, dizziness, and cognitive deficits.    Continue computer activities at home (typing, copying (both words and numbers), checking and responding to emails, etc), word searches, Home management (Perfrom 2-3 chores per day including: laundry, cleaning, cooking, grocery shopping with sup), continue use of pill box and use of Alexa and notebook for memory strategies, etc.   Follow up with neurology regarding referral to counselor

## 2019-12-05 ENCOUNTER — Encounter: Payer: Self-pay | Admitting: Adult Health

## 2019-12-05 DIAGNOSIS — R5383 Other fatigue: Secondary | ICD-10-CM | POA: Diagnosis not present

## 2019-12-05 DIAGNOSIS — I6930 Unspecified sequelae of cerebral infarction: Secondary | ICD-10-CM | POA: Diagnosis not present

## 2019-12-05 DIAGNOSIS — E7800 Pure hypercholesterolemia, unspecified: Secondary | ICD-10-CM | POA: Diagnosis not present

## 2019-12-09 ENCOUNTER — Ambulatory Visit: Payer: 59

## 2019-12-09 ENCOUNTER — Other Ambulatory Visit: Payer: Self-pay

## 2019-12-09 DIAGNOSIS — R41841 Cognitive communication deficit: Secondary | ICD-10-CM | POA: Diagnosis not present

## 2019-12-09 DIAGNOSIS — M6281 Muscle weakness (generalized): Secondary | ICD-10-CM | POA: Diagnosis not present

## 2019-12-09 DIAGNOSIS — R278 Other lack of coordination: Secondary | ICD-10-CM | POA: Diagnosis not present

## 2019-12-09 DIAGNOSIS — R471 Dysarthria and anarthria: Secondary | ICD-10-CM | POA: Diagnosis not present

## 2019-12-09 DIAGNOSIS — R2689 Other abnormalities of gait and mobility: Secondary | ICD-10-CM | POA: Diagnosis not present

## 2019-12-09 DIAGNOSIS — I69318 Other symptoms and signs involving cognitive functions following cerebral infarction: Secondary | ICD-10-CM | POA: Diagnosis not present

## 2019-12-09 DIAGNOSIS — R2681 Unsteadiness on feet: Secondary | ICD-10-CM | POA: Diagnosis not present

## 2019-12-09 NOTE — Patient Instructions (Signed)
Continue to practice your stronger voice - relax your voice box and think of the power in your voice coming from your abdomen.

## 2019-12-09 NOTE — Therapy (Signed)
[facility] [address] [place], [place], [address] Phone: [phone]   Fax:  [phone]  Speech Language Pathology Treatment/Renewal Summary  Patient Details  Name: Tara [name] MRN: [id number] Date of Birth: 09/27/1965 Referring Provider (SLP): Frann Rider NP   Encounter Date: 12/09/2019   End of Session - 12/09/19 1615    Visit Number 15    Number of Visits 23    Date for SLP Re-Evaluation 01/16/20    SLP Start Time 1450    SLP Stop Time  1531    SLP Time Calculation (min) 41 min    Activity Tolerance Patient tolerated treatment well           Past Medical History:  Diagnosis Date  . Asthma   . Hyperlipidemia   . Migraine   . Rotator cuff tear   . Spondylolisthesis    L4-5    Past Surgical History:  Procedure Laterality Date  . BUBBLE STUDY  07/30/2019   Procedure: BUBBLE STUDY;  Surgeon: Donato Heinz, MD;  Location: [facility];  Service: Cardiovascular;;  . LOOP RECORDER INSERTION N/A 07/30/2019   Procedure: LOOP RECORDER INSERTION;  Surgeon: Evans Lance, MD;  Location: [facility];  Service: Cardiovascular;  Laterality: N/A;  . No prior surgery    . TEE WITHOUT CARDIOVERSION N/A 07/30/2019   Procedure: TRANSESOPHAGEAL ECHOCARDIOGRAM (TEE);  Surgeon: Donato Heinz, MD;  Location: [facility] ENDOSCOPY;  Service: Cardiovascular;  Laterality: N/A;    There were no vitals filed for this visit.   Subjective Assessment - 12/09/19 1457    Subjective "Sometimes (loud /a/) makes my throat scratchy."    Currently in Pain? Yes    Pain Location Finger (Comment which one)   middle finger   Pain Orientation Left    Pain Descriptors / Indicators Numbness;Tingling    Pain Type Acute pain                 ADULT SLP TREATMENT - 12/09/19 1459      General Information   Behavior/Cognition Alert;Cooperative;Pleasant mood   flat affect     Treatment Provided   Treatment  provided Cognitive-Linquistic      Cognitive-Linquistic Treatment   Treatment focused on Cognition;Patient/family/caregiver education    Skilled Treatment (speech tx - indvidual) SLP worked on pt's /a/ as she reported lingual strain/squeeze. SLP guided pt through "hey" without laryngeal strain and then helped shape pt's /a/ with WNL vocal quality. Pt with average mid-upper 80s dB. Pt read sentences with WNL volume 100% of the time. SLP incr'd complexity of linguiatic demand and had pt describe pictures - pt req'd rare min A for incr'd strength/volume - average low 70s. however, near the end of the task pt noted to clear throat and cough slightly - "My throat feels scratchy," she said. Pt worked with pt on abdominal breathing (AB) with 50% success at rest in a seated position. Encouraged pt to decr pt strain on vocal folds when she practices louder speech.      Assessment / Recommendations / Plan   Plan Continue with current plan of care      Progression Toward Goals   Progression toward goals Progressing toward goals            SLP Education - 12/09/19 1614    Education Details intro to abdominal breathing    Person(s) Educated Patient    Methods Explanation;Demonstration    Comprehension Verbalized understanding;Returned demonstration;Verbal cues required;Need further  instruction            SLP Short Term Goals - 11/26/19 1455      SLP SHORT TERM GOAL #1   Title Pt will utiltize external aids to schedule and complete daily tasks with rare min A over 3 sessions    Baseline 10-28-19, 10-29-19    Status Partially Met      SLP SHORT TERM GOAL #2   Title Pt will uitlize compensatory strategies to attend to and process verbal instructions and converstion accurately with rare min A over 2 sessions    Baseline 10-28-19, 10-29-19    Status On-going      SLP SHORT TERM GOAL #3   Title Pt will read 1 paragraph (5-7 sentences) and recall details with rare min A over 2 sessions    Baseline 10-28-19     Status Achieved            SLP Long Term Goals - 12/09/19 1619      SLP LONG TERM GOAL #1   Title Pt will utilize compensations for attention to prepare a main dish for 3 meals with rare min A    Baseline 10-28-19, 10-29-19, 11-05-19    Status Achieved      SLP LONG TERM GOAL #2   Title Pt will carryover compensatory strategies for attention and processing to run 3 errands in a row successfully with rare min A    Baseline 11-07-19, 11-11-19    Status Achieved      SLP LONG TERM GOAL #3   Title Pt will return to ADL/IADL task after a distraction and succesfully complete task before starting a new activity with rare min A over 3 sessions    Baseline 11-05-19, 6-11-216-15-21    Status Achieved      SLP LONG TERM GOAL #4   Title pt will incr voice volume in 8 minuts simple conversation to low 70s in 2 sessions    Time 4   renewed 12-08-19   Period Weeks    Status On-going            Plan - 12/09/19 1616    Clinical Impression Statement Tara [name] continues to present with at least mild but improving cognitive linguistic impairments of attention, processing, and visuospatial skills. See "skilled intervention" for more details. We have begun work on improving the strength of her voice, which she states was much stronger premorbidly. She has some voice strength exercises she is completing at this time. Ongoing training of compensations for cogntive impairments for successful completion of IADL's, intelligiblilty. Continue skilled ST    Speech Therapy Frequency 2x / week    Duration --   8 weeks or 17 visits   Treatment/Interventions Language facilitation;Environmental controls;Cueing hierarchy;SLP instruction and feedback;Compensatory strategies;Functional tasks;Cognitive reorganization;Patient/family education;Multimodal communcation approach;Internal/external aids;Compensatory techniques    Potential to Achieve Goals Good           Patient will benefit from skilled therapeutic  intervention in order to improve the following deficits and impairments:   Dysarthria and anarthria  Cognitive communication deficit    Problem List Patient Active Problem List   Diagnosis Date Noted  . CVA (cerebral vascular accident) ([facility]) 07/29/2019  . Dysphagia 07/29/2019  . Left hand weakness 07/29/2019  . Palpitations 09/15/2015  . Diastolic dysfunction 28/76/8115  . Renal cyst, left 06/19/2013  . History of migraine headaches 03/05/2013  . Complicated migraine 72/62/0355  . HLD (hyperlipidemia) 03/05/2013  . 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 Rotator cuff syndrome  03/05/2013  . Low back pain 03/05/2013    [facility] ,MS, CCC-SLP  12/09/2019, 4:20 PM  [facility] [address] [place], [place], [address] Phone: [phone]   Fax:  [phone]   Name: Tara [name] MRN: [id number] Date of Birth: 05/08/1966

## 2019-12-11 ENCOUNTER — Encounter: Payer: 59 | Admitting: Occupational Therapy

## 2019-12-11 ENCOUNTER — Ambulatory Visit: Payer: 59 | Admitting: Physical Therapy

## 2019-12-11 ENCOUNTER — Other Ambulatory Visit: Payer: Self-pay

## 2019-12-11 ENCOUNTER — Encounter: Payer: Self-pay | Admitting: Psychology

## 2019-12-11 DIAGNOSIS — R471 Dysarthria and anarthria: Secondary | ICD-10-CM | POA: Diagnosis not present

## 2019-12-11 DIAGNOSIS — R2681 Unsteadiness on feet: Secondary | ICD-10-CM

## 2019-12-11 DIAGNOSIS — M6281 Muscle weakness (generalized): Secondary | ICD-10-CM | POA: Diagnosis not present

## 2019-12-11 DIAGNOSIS — R278 Other lack of coordination: Secondary | ICD-10-CM | POA: Diagnosis not present

## 2019-12-11 DIAGNOSIS — R41841 Cognitive communication deficit: Secondary | ICD-10-CM | POA: Diagnosis not present

## 2019-12-11 DIAGNOSIS — R2689 Other abnormalities of gait and mobility: Secondary | ICD-10-CM

## 2019-12-11 DIAGNOSIS — I69318 Other symptoms and signs involving cognitive functions following cerebral infarction: Secondary | ICD-10-CM | POA: Diagnosis not present

## 2019-12-11 NOTE — Patient Instructions (Signed)
Access Code: [email or web address] URL: [email or web address] Date: 12/11/2019 Prepared by: Lonia Blood  Exercises Sit to Stand - 1-2 x daily - 5 x weekly - 3 sets - 5 reps Standing Marching - 1-2 x daily - 5 x weekly - 3 sets Tandem Stance - 1 x daily - 5 x weekly - 3 sets - 20 hold Seated Nose to Left Knee Vestibular Habituation - 2 x daily - 5 x weekly - 1 sets - 3-4 reps Seated Nose to Right Knee Vestibular Habituation - 2 x daily - 5 x weekly - 1 sets - 3-4 reps Vestibular Habituation - Seated Horizontal Head Rotation - 1 x daily - 5 x weekly - 3 sets - 5 reps Seated Head Nods Vestibular Habituation - 1 x daily - 5 x weekly - 3 sets - 5 reps Lateral Weight Shift with Arm Raise - 1 x daily - 5 x weekly - 1 sets - 10 reps Standing Quarter Turn with Counter Support - 1 x daily - 5 x weekly - 1 sets - 5 reps Standing Gaze Stabilization with Head Rotation - 1-2 x daily - 5 x weekly - 3 sets - 10 reps Standing Balance with Eyes Closed on Foam - 1 x daily - 5 x weekly - 3 sets - 15 hold

## 2019-12-12 NOTE — Therapy (Signed)
[facility] [address] [place], [place], [address] Phone: [phone]   Fax:  [phone]  Physical Therapy Treatment/Discharge Visit  Patient Details  Name: Tara [name] MRN: [id number] Date of Birth: Nov 24, 1965 No data recorded  Encounter Date: 12/11/2019   PT End of Session - 12/12/19 0733    Visit Number 24    Number of Visits 24    Authorization Type [facility]    PT Start Time 1448    PT Stop Time 1530    PT Time Calculation (min) 42 min    Equipment Utilized During Treatment Gait belt    Activity Tolerance Patient tolerated treatment well;Other (comment)   overall slowed mobility patterns, c/o 1/10 dizziness   Behavior During Therapy [facility] for tasks assessed/performed           Past Medical History:  Diagnosis Date  . Asthma   . Hyperlipidemia   . Migraine   . Rotator cuff tear   . Spondylolisthesis    L4-5    Past Surgical History:  Procedure Laterality Date  . BUBBLE STUDY  07/30/2019   Procedure: BUBBLE STUDY;  Surgeon: [name], MD;  Location: [facility] ENDOSCOPY;  Service: Cardiovascular;;  . LOOP RECORDER INSERTION N/A 07/30/2019   Procedure: LOOP RECORDER INSERTION;  Surgeon: [name], MD;  Location: [facility] INVASIVE CV LAB;  Service: Cardiovascular;  Laterality: N/A;  . No prior surgery    . TEE WITHOUT CARDIOVERSION N/A 07/30/2019   Procedure: TRANSESOPHAGEAL ECHOCARDIOGRAM (TEE);  Surgeon: [name], MD;  Location: [facility] ENDOSCOPY;  Service: Cardiovascular;  Laterality: N/A;    There were no vitals filed for this visit.   Subjective Assessment - 12/11/19 1452    Subjective Nothing new.  Like the walking pole.  Helps me feel I can get out more.    Patient Stated Goals Pt's goals for therapy are to strengthen to help with the tired feeling.    Currently in Pain? Yes    Pain Score 1     Pain Location Finger (Comment which one)   L 3rd and 4th fingers   Pain Orientation  Left    Pain Descriptors / Indicators Numbness;Tingling    Pain Onset More than a month ago    Pain Frequency Intermittent    Aggravating Factors  use    Pain Relieving Factors unknown                             [facility] Adult PT Treatment/Exercise - 12/11/19 1450      Transfers   Transfers Sit to Stand;Stand to Sit    Sit to Stand 6: Modified independent (Device/Increase time)    Stand to Sit Without upper extremity assist;To chair/3-in-1;With upper extremity assist;6: Modified independent (Device/Increase time)      Ambulation/Gait   Ambulation/Gait Yes    Ambulation/Gait Assistance 5: Supervision    Ambulation Distance (Feet) 330 Feet   200   Assistive device Other (Comment)   single walking pole   Gait Pattern Step-through pattern;Decreased trunk rotation;Wide base of support   generally slowed pace   Ambulation Surface Level;Indoor    Gait velocity 22.31 sec= 1.47 ft/sec      High Level Balance   High Level Balance Activities Other (comment)    High Level Balance Comments Forward step and weigthshift, with focus on heelstrike, x 5 reps each leg, then alternating legs x 5 reps.  Standing  at counter with RUE support.                Balance Exercises - 12/11/19 1650      Balance Exercises: Standing   Standing Eyes Opened Wide (BOA);Foam/compliant surface;Head turns;5 reps   Head nods; progressed as part of HEP (from sitting)   Standing Eyes Closed Wide (BOA);Foam/compliant surface;2 reps;20 secs   reviewed as progression of HEP   Other Standing Exercises Progressed HEP in corner, with cues to perform marching in place on foam (trialed x 5 reps); lateral weightshifting (no UE reach) on foam, x 5 reps.    Other Standing Exercises Comments Reviewed quarter turns as part of HEP, reminder cues for head turn to target, then body turn 90 degrees.  Performed 2x to R, then return to center, then 2 x to L and return to center.             PT Education -  12/12/19 0732    Education Details Discussed progress towards goals, POC, including d/c this visit.  Discussed continueing to perform HEP and continueing to walk as part of exercises and continue to slowly return to normal activities    Person(s) Educated Patient    Methods Explanation    Comprehension Verbalized understanding            PT Short Term Goals - 09/10/19 0944      PT SHORT TERM GOAL #1   Title Pt will be independent with HEP for improved strength, balance, and gait.  TARGET for all STGs:  4 weeks:  09/05/2019    Time 4    Period Weeks    Status Achieved      PT SHORT TERM GOAL #2   Title Pt will improve 5x sit<>stand to less than or equal to 15 seconds for improved functional lower extremity strength.    Baseline 21.68 seconds on 09/03/19    Time 4    Period Weeks    Status Not Met      PT SHORT TERM GOAL #3   Title Pt will improve DGI score to at least 16/24 for decresaed fall risk.    Baseline 10/24 on 09/10/19    Time 4    Period Weeks    Status Not Met      PT SHORT TERM GOAL #4   Title Pt will verbalize understanding of:  fall prevention in home environment and CVA warning signs/symptoms    Time 4    Period Weeks    Status Achieved             PT Long Term Goals - 12/12/19 0734      PT LONG TERM GOAL #1   Title Pt will be independent with progression of HEP to address strength, balance, gait, and vestibular impairments.  TARGET 12/11/19 for all LTGs    Baseline pt will continue to benefit from further revisions/additions to HEP    Time 2    Period Weeks    Status Achieved      PT LONG TERM GOAL #2   Title Pt will ambulate at least 500 ft, indoor and outdoor surfaces, with supervision and no LOB for return to independent community gait.    Baseline met on 12/03/19 with use of walking stick    Time 2    Period Weeks    Status Achieved      PT LONG TERM GOAL #3   Title Pt will improve gait speed to at least  1.6 ft/sec with walking stick for improved  gait efficiency and decr fall risk.    Baseline 1.57 ft/sec on 09/25/19; 1.75 ft/sec, 24.53 seconds = 1.33 ft/sec  using walking stick on 11/26/19; 1.47 ft/sec 12/11/2019    Time 2    Period Weeks    Status Not Met                 Plan - 12/12/19 0735    Clinical Impression Statement LTG 1 met for independence with progression of HEP; LTG 3 not met.  Pt has improved gait velocity from 1.33 ft/sec to 1/47 ft/sec with use of walking pole, but not to goal level.  Pt remains overall gaurded with slowed mobility; reports she is trying to get out to community more.  PT has progressed head motions from sitting to standing as part of final HEP.  Pt continues to report 1/10 dizziness and continues to have guarded motions; at this time, PT is recommending discharge for paitnet to continue to work on exercises and progression of walking on her own.    Personal Factors and Comorbidities Comorbidity 3+    Comorbidities PMH:  asthma, hyperlipidemia, spondyloslisthesis, R RTC tear    Examination-Activity Limitations Locomotion Level;Transfers;Stand;Stairs    Examination-Participation Restrictions Community Activity;Yard Work;Other    Stability/Clinical Decision Making Evolving/Moderate complexity    Rehab Potential Good    PT Frequency 1x / week    PT Duration 2 weeks    PT Treatment/Interventions ADLs/Self Care Home Management;Gait training;Stair training;Functional mobility training;Therapeutic activities;Therapeutic exercise;Balance training;Neuromuscular re-education;DME Instruction;Patient/family education;Electrical Stimulation;Orthotic Fit/Training;Vestibular    PT Next Visit Plan Discharge this visit.           Patient will benefit from skilled therapeutic intervention in order to improve the following deficits and impairments:  Abnormal gait, Difficulty walking, Decreased endurance, Decreased balance, Decreased activity tolerance, Decreased strength, Decreased mobility  Visit  Diagnosis: Other abnormalities of gait and mobility  Unsteadiness on feet     Problem List Patient Active Problem List   Diagnosis Date Noted  . CVA (cerebral vascular accident) ([place]) 07/29/2019  . Dysphagia 07/29/2019  . Left hand weakness 07/29/2019  . Palpitations 09/15/2015  . Diastolic dysfunction 16/02/9603  . Renal cyst, left 06/19/2013  . History of migraine headaches 03/05/2013  . Complicated migraine 54/01/8118  . HLD (hyperlipidemia) 03/05/2013  . 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 Rotator cuff syndrome 03/05/2013  . Low back pain 03/05/2013    Deniqua Perry W. 12/12/2019, 7:38 AM  Frazier Butt., PT   [facility] [address] [place], [place], [address] Phone: [phone]   Fax:  [phone]  Name: Tara [name] MRN: [id number] Date of Birth: [birth year]   PHYSICAL THERAPY DISCHARGE SUMMARY  Visits from Start of Care: 24  Current functional level related to goals / functional outcomes:  PT Long Term Goals - 12/12/19 0734      PT LONG TERM GOAL #1   Title Pt will be independent with progression of HEP to address strength, balance, gait, and vestibular impairments.  TARGET 12/11/19 for all LTGs    Baseline pt will continue to benefit from further revisions/additions to HEP    Time 2    Period Weeks    Status Achieved      PT LONG TERM GOAL #2   Title Pt will ambulate at least 500 ft, indoor and outdoor surfaces, with supervision and no LOB for return to independent community gait.    Baseline met on 12/03/19  with use of walking stick    Time 2    Period Weeks    Status Achieved      PT LONG TERM GOAL #3   Title Pt will improve gait speed to at least 1.6 ft/sec with walking stick for improved gait efficiency and decr fall risk.    Baseline 1.57 ft/sec on 09/25/19; 1.75 ft/sec, 24.53 seconds = 1.33 ft/sec  using walking stick on 11/26/19; 1.47 ft/sec 12/11/2019    Time 2    Period Weeks     Status Not Met          Most recent LTGs assessed, pt met 2 of 3 LTGs.   Remaining deficits: Continued decreased balance, guarding of gait and mobility in general, dizziness with head motions   Education / Equipment: Educated in HEP, progression of HEP, importance of continued HEP and MOVEMENT through the day  Plan: Patient agrees to discharge.  Patient goals were partially met. Patient is being discharged due to                                                     ?????maximizing her rehab potential at this time.    Mady Haagensen, PT 12/12/19 7:41 AM Phone: [phone] Fax: [phone]

## 2019-12-15 ENCOUNTER — Ambulatory Visit (INDEPENDENT_AMBULATORY_CARE_PROVIDER_SITE_OTHER): Payer: 59 | Admitting: *Deleted

## 2019-12-15 DIAGNOSIS — I639 Cerebral infarction, unspecified: Secondary | ICD-10-CM | POA: Diagnosis not present

## 2019-12-15 LAB — CUP PACEART REMOTE DEVICE CHECK
Date Time Interrogation Session: 20210718230500
Implantable Pulse Generator Implant Date: 20210303

## 2019-12-16 NOTE — Progress Notes (Signed)
[facility] Summary Report / Loop Recorder 

## 2019-12-17 ENCOUNTER — Ambulatory Visit: Payer: 59 | Admitting: Speech Pathology

## 2019-12-17 ENCOUNTER — Encounter: Payer: Self-pay | Admitting: Speech Pathology

## 2019-12-17 ENCOUNTER — Other Ambulatory Visit: Payer: Self-pay

## 2019-12-17 DIAGNOSIS — M6281 Muscle weakness (generalized): Secondary | ICD-10-CM | POA: Diagnosis not present

## 2019-12-17 DIAGNOSIS — R278 Other lack of coordination: Secondary | ICD-10-CM | POA: Diagnosis not present

## 2019-12-17 DIAGNOSIS — R2689 Other abnormalities of gait and mobility: Secondary | ICD-10-CM | POA: Diagnosis not present

## 2019-12-17 DIAGNOSIS — I69318 Other symptoms and signs involving cognitive functions following cerebral infarction: Secondary | ICD-10-CM | POA: Diagnosis not present

## 2019-12-17 DIAGNOSIS — R2681 Unsteadiness on feet: Secondary | ICD-10-CM | POA: Diagnosis not present

## 2019-12-17 DIAGNOSIS — R471 Dysarthria and anarthria: Secondary | ICD-10-CM

## 2019-12-17 DIAGNOSIS — R41841 Cognitive communication deficit: Secondary | ICD-10-CM | POA: Diagnosis not present

## 2019-12-17 NOTE — Therapy (Signed)
[facility] [address] [place], [place], [address] Phone: [phone]   Fax:  [phone]  Speech Language Pathology Treatment  Patient Details  Name: Tara [name] MRN: [id number] Date of Birth: 1965/09/20 Referring Provider (SLP): Frann Rider NP   Encounter Date: 12/17/2019   End of Session - 12/17/19 1309    Visit Number 16    Number of Visits 23    Date for SLP Re-Evaluation 01/16/20    SLP Start Time 3532    SLP Stop Time  9924    SLP Time Calculation (min) 42 min    Activity Tolerance Patient tolerated treatment well;Other (comment)           Past Medical History:  Diagnosis Date  . Asthma   . Hyperlipidemia   . Migraine   . Rotator cuff tear   . Spondylolisthesis    L4-5    Past Surgical History:  Procedure Laterality Date  . BUBBLE STUDY  07/30/2019   Procedure: BUBBLE STUDY;  Surgeon: Donato Heinz, MD;  Location: [facility];  Service: Cardiovascular;;  . LOOP RECORDER INSERTION N/A 07/30/2019   Procedure: LOOP RECORDER INSERTION;  Surgeon: Evans Lance, MD;  Location: [facility] CV LAB;  Service: Cardiovascular;  Laterality: N/A;  . No prior surgery    . TEE WITHOUT CARDIOVERSION N/A 07/30/2019   Procedure: TRANSESOPHAGEAL ECHOCARDIOGRAM (TEE);  Surgeon: Donato Heinz, MD;  Location: [facility] ENDOSCOPY;  Service: Cardiovascular;  Laterality: N/A;    There were no vitals filed for this visit.   Subjective Assessment - 12/17/19 1237    Subjective "I still get visually overwhelmed in places that are crowded or sometimes I get headaches."    Currently in Pain? Yes    Pain Score 2     Pain Location Finger (Comment which one)    Pain Orientation Left    Pain Descriptors / Indicators Aching;Burning;Tingling    Pain Type Chronic pain    Pain Onset More than a month ago    Pain Frequency Intermittent                 ADULT SLP TREATMENT - 12/17/19 1241      General  Information   Behavior/Cognition Alert;Cooperative;Pleasant mood      Treatment Provided   Treatment provided Cognitive-Linquistic      Cognitive-Linquistic Treatment   Treatment focused on Cognition;Dysarthria;Patient/family/caregiver education    Skilled Treatment Reviewed strategy of not stoping a task until it is complete due to distractions. She reports carrover of this, however she reports leaving the fridge door open. Initited strategy of stop, scan room before going to next task. We discussed being mindful in the moment when completing taks, rather than thinking about the next things she needs to do. She able to go into the store to get 2 things and started to drive short distances. Speech: volume averaged 64dB, loud /a/ to recalibrate volume required visual, verbal cues and modeling increased from 75dB to average of 84dB. Sentence level volume averged 74dB in structured task      Assessment / Recommendations / Plan   Plan Continue with current plan of care      Progression Toward Goals   Progression toward goals Progressing toward goals              SLP Short Term Goals - 12/17/19 1308      SLP SHORT TERM GOAL #1   Title Pt will utiltize external aids to schedule  and complete daily tasks with rare min A over 3 sessions    Baseline 10-28-19, 10-29-19    Status Partially Met      SLP SHORT TERM GOAL #2   Title Pt will uitlize compensatory strategies to attend to and process verbal instructions and converstion accurately with rare min A over 2 sessions    Baseline 10-28-19, 10-29-19    Status On-going      SLP SHORT TERM GOAL #3   Title Pt will read 1 paragraph (5-7 sentences) and recall details with rare min A over 2 sessions    Baseline 10-28-19    Status Achieved            SLP Long Term Goals - 12/17/19 1309      SLP LONG TERM GOAL #1   Title Pt will utilize compensations for attention to prepare a main dish for 3 meals with rare min A    Baseline 10-28-19, 10-29-19, 11-05-19     Status Achieved      SLP LONG TERM GOAL #2   Title Pt will carryover compensatory strategies for attention and processing to run 3 errands in a row successfully with rare min A    Baseline 11-07-19, 11-11-19    Status Achieved      SLP LONG TERM GOAL #3   Title Pt will return to ADL/IADL task after a distraction and succesfully complete task before starting a new activity with rare min A over 3 sessions    Baseline 11-05-19, 6-11-216-15-21    Status Achieved      SLP LONG TERM GOAL #4   Title pt will incr voice volume in 8 minuts simple conversation to low 70s in 2 sessions    Time 3   renewed 12-08-19   Period Weeks    Status On-going            Plan - 12/17/19 1308    Clinical Impression Statement Tara [name] continues to present with at least mild but improving cognitive linguistic impairments of attention, processing, and visuospatial skills. See "skilled intervention" for more details. We have begun work on improving the strength of her voice, which she states was much stronger premorbidly. She has some voice strength exercises she is completing at this time. Ongoing training of compensations for cogntive impairments for successful completion of IADL's, intelligiblilty. Continue skilled ST    Speech Therapy Frequency 2x / week    Duration --   8 weeks or 17 visits   Treatment/Interventions Language facilitation;Environmental controls;Cueing hierarchy;SLP instruction and feedback;Compensatory strategies;Functional tasks;Cognitive reorganization;Patient/family education;Multimodal communcation approach;Internal/external aids;Compensatory techniques    Potential to Achieve Goals Good           Patient will benefit from skilled therapeutic intervention in order to improve the following deficits and impairments:   Cognitive communication deficit  Dysarthria and anarthria    Problem List Patient Active Problem List   Diagnosis Date Noted  . CVA (cerebral vascular accident) ([facility])  07/29/2019  . Dysphagia 07/29/2019  . Left hand weakness 07/29/2019  . Palpitations 09/15/2015  . Diastolic dysfunction 33/29/5188  . Renal cyst, left 06/19/2013  . History of migraine headaches 03/05/2013  . Complicated migraine 41/66/0630  . HLD (hyperlipidemia) 03/05/2013  . 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 Rotator cuff syndrome 03/05/2013  . Low back pain 03/05/2013    Tara [name], Tara Rusk MS, CCC-SLP 12/17/2019, 1:14 PM  [facility] [address] [place], [place], [address] Phone: [phone]   Fax:  [phone]   Name: DEMONI PARMAR MRN: [id number] Date of Birth: 1966-02-11

## 2019-12-17 NOTE — Patient Instructions (Addendum)
   Before you go to another task or leave a room:  Stop   Scan:  corner to corner             Top to bottom  Try to be mindful in the moment of what you are working on, don't go on automatic pilot thinking of what needs to be done next  Consider medication for attention short term - ask your doctor  Keep up loud AH! With effort of 8/10  Practice descriptions - you should feel like you are making effort for volume and a little too loud

## 2019-12-18 ENCOUNTER — Ambulatory Visit: Payer: 59

## 2019-12-18 ENCOUNTER — Other Ambulatory Visit: Payer: Self-pay | Admitting: Family Medicine

## 2019-12-18 ENCOUNTER — Ambulatory Visit
Admission: RE | Admit: 2019-12-18 | Discharge: 2019-12-18 | Disposition: A | Discharge status: Home / Self Care | Payer: 59 | Source: Ambulatory Visit | Attending: Family Medicine | Admitting: Family Medicine

## 2019-12-18 DIAGNOSIS — I69318 Other symptoms and signs involving cognitive functions following cerebral infarction: Secondary | ICD-10-CM | POA: Diagnosis not present

## 2019-12-18 DIAGNOSIS — R471 Dysarthria and anarthria: Secondary | ICD-10-CM

## 2019-12-18 DIAGNOSIS — R41841 Cognitive communication deficit: Secondary | ICD-10-CM

## 2019-12-18 DIAGNOSIS — M6281 Muscle weakness (generalized): Secondary | ICD-10-CM | POA: Diagnosis not present

## 2019-12-18 DIAGNOSIS — R278 Other lack of coordination: Secondary | ICD-10-CM | POA: Diagnosis not present

## 2019-12-18 DIAGNOSIS — Z1231 Encounter for screening mammogram for malignant neoplasm of breast: Secondary | ICD-10-CM

## 2019-12-18 DIAGNOSIS — R2681 Unsteadiness on feet: Secondary | ICD-10-CM | POA: Diagnosis not present

## 2019-12-18 DIAGNOSIS — R2689 Other abnormalities of gait and mobility: Secondary | ICD-10-CM | POA: Diagnosis not present

## 2019-12-18 MED FILL — ROSUVASTATIN CALCIUM 40 MG: 40 | 90 days supply | Qty: 90 | Fill #0

## 2019-12-18 NOTE — Therapy (Signed)
[facility] [address] [place], [place], [address] Phone: [phone]   Fax:  [phone]  Speech Language Pathology Treatment  Patient Details  Name: Tara [name] MRN: [id number] Date of Birth: 11/17/1965 Referring Provider (SLP): Jessica McCue NP   Encounter Date: 12/18/2019   End of Session - 12/18/19 1721    Visit Number 17    Number of Visits 23    Date for SLP Re-Evaluation 01/16/20    SLP Start Time 1319    SLP Stop Time  1400    SLP Time Calculation (min) 41 min    Activity Tolerance Patient tolerated treatment well;Other (comment)           Past Medical History:  Diagnosis Date  . Asthma   . Hyperlipidemia   . Migraine   . Rotator cuff tear   . Spondylolisthesis    L4-5    Past Surgical History:  Procedure Laterality Date  . BUBBLE STUDY  07/30/2019   Procedure: BUBBLE STUDY;  Surgeon: Schumann, Christopher L, MD;  Location: [facility] ENDOSCOPY;  Service: Cardiovascular;;  . LOOP RECORDER INSERTION N/A 07/30/2019   Procedure: LOOP RECORDER INSERTION;  Surgeon: Taylor, Gregg W, MD;  Location: [facility] INVASIVE CV LAB;  Service: Cardiovascular;  Laterality: N/A;  . No prior surgery    . TEE WITHOUT CARDIOVERSION N/A 07/30/2019   Procedure: TRANSESOPHAGEAL ECHOCARDIOGRAM (TEE);  Surgeon: Schumann, Christopher L, MD;  Location: [facility] ENDOSCOPY;  Service: Cardiovascular;  Laterality: N/A;    There were no vitals filed for this visit.   Subjective Assessment - 12/18/19 1326    Subjective Pt reported yesterday's session included work on loudness and compensatory techniques for task completion (scaning the room prior to leaving, etc)    Currently in Pain? Yes    Pain Score 1     Pain Location Finger (Comment which one)   middle and ring fingers   Pain Orientation Left    Pain Descriptors / Indicators Aching;Burning    Pain Type Chronic pain    Pain Radiating Towards to elbow                 ADULT  SLP TREATMENT - 12/18/19 1328      General Information   Behavior/Cognition Alert;Cooperative;Pleasant mood      Treatment Provided   Treatment provided Cognitive-Linquistic      Cognitive-Linquistic Treatment   Treatment focused on Cognition;Dysarthria;Patient/family/caregiver education    Skilled Treatment (cognition 10 imnutes) Pt tells SLP she is still completing her OT exercises and told SLP each one. She dropped a glass Tuesday due to lt hand pain/symptoms.  She told SLP that she is trying to be more mindful about finishing tasks when she elaves the room by scanning the room an dhas done so a few times since her ST yesterday. (speech tx) Pt loud /a/ to habitualize louder conversational speech with average mid 80s dB. SLP targeted louder speech specifically with sentence responses and pt spoke average low to mid 70s dB. In reading 1-2 sentences pt averaged low 70s dB with some fleeting decreases into upper 60s dB. SPL provided pt with sentence elvel tasks for homework telling pt to practice 20 minutes twice a day on her loudness with these tasks.       Assessment / Recommendations / Plan   Plan Continue with current plan of care      Progression Toward Goals   Progression toward goals Progressing toward goals                SLP Short Term Goals - 12/17/19 1308      SLP SHORT TERM GOAL #1   Title Pt will utiltize external aids to schedule and complete daily tasks with rare min A over 3 sessions    Baseline 10-28-19, 10-29-19    Status Partially Met      SLP SHORT TERM GOAL #2   Title Pt will uitlize compensatory strategies to attend to and process verbal instructions and converstion accurately with rare min A over 2 sessions    Baseline 10-28-19, 10-29-19    Status On-going      SLP SHORT TERM GOAL #3   Title Pt will read 1 paragraph (5-7 sentences) and recall details with rare min A over 2 sessions    Baseline 10-28-19    Status Achieved            SLP Long Term Goals - 12/18/19  1723      SLP LONG TERM GOAL #1   Title Pt will utilize compensations for attention to prepare a main dish for 3 meals with rare min A    Baseline 10-28-19, 10-29-19, 11-05-19    Status Achieved      SLP LONG TERM GOAL #2   Title Pt will carryover compensatory strategies for attention and processing to run 3 errands in a row successfully with rare min A    Baseline 11-07-19, 11-11-19    Status Achieved      SLP LONG TERM GOAL #3   Title Pt will return to ADL/IADL task after a distraction and succesfully complete task before starting a new activity with rare min A over 3 sessions    Baseline 11-05-19, 6-11-216-15-21    Status Achieved      SLP LONG TERM GOAL #4   Title pt will incr voice volume in 8 minutes simple conversation to low 70s in 2 sessions    Time 3   renewed 12-08-19   Period Weeks    Status On-going            Plan - 12/18/19 1722    Clinical Impression Statement Aryani continues to present with at least mild but improving cognitive linguistic impairments of attention, processing, and visuospatial skills. See "skilled intervention" for more details. Work contiues on improving the strength of her voice, which she states was much stronger premorbidly. She has some voice strength exercises she is completing at this time. Ongoing training of compensations for cogntive impairments for successful completion of IADL's, intelligiblilty. Continue skilled ST    Speech Therapy Frequency 2x / week    Duration --   8 weeks or 17 visits   Treatment/Interventions Language facilitation;Environmental controls;Cueing hierarchy;SLP instruction and feedback;Compensatory strategies;Functional tasks;Cognitive reorganization;Patient/family education;Multimodal communcation approach;Internal/external aids;Compensatory techniques    Potential to Achieve Goals Good           Patient will benefit from skilled therapeutic intervention in order to improve the following deficits and impairments:     Dysarthria and anarthria  Cognitive communication deficit    Problem List Patient Active Problem List   Diagnosis Date Noted  . CVA (cerebral vascular accident) ([facility]) 07/29/2019  . Dysphagia 07/29/2019  . Left hand weakness 07/29/2019  . Palpitations 09/15/2015  . Diastolic dysfunction 09/15/2015  . Renal cyst, left 06/19/2013  . History of migraine headaches 03/05/2013  . 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 HLD (hyperlipidemia) 03/05/2013  . 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 Rotator cuff syndrome 03/05/2013  . Low back pain 03/05/2013    SCHINKE,CARL ,MS, CCC-SLP    12/18/2019, 5:23 PM  [facility] [address] [place], [place], [address] Phone: [phone]   Fax:  [phone]   Name: Annalaya Wile MRN: [id number] Date of Birth: [birth year]

## 2019-12-22 ENCOUNTER — Other Ambulatory Visit: Payer: Self-pay | Admitting: Family Medicine

## 2019-12-22 DIAGNOSIS — R928 Other abnormal and inconclusive findings on diagnostic imaging of breast: Secondary | ICD-10-CM

## 2019-12-24 ENCOUNTER — Other Ambulatory Visit: Payer: Self-pay

## 2019-12-24 ENCOUNTER — Ambulatory Visit: Payer: 59

## 2019-12-24 DIAGNOSIS — R471 Dysarthria and anarthria: Secondary | ICD-10-CM

## 2019-12-24 DIAGNOSIS — R2681 Unsteadiness on feet: Secondary | ICD-10-CM | POA: Diagnosis not present

## 2019-12-24 DIAGNOSIS — R41841 Cognitive communication deficit: Secondary | ICD-10-CM | POA: Diagnosis not present

## 2019-12-24 DIAGNOSIS — R2689 Other abnormalities of gait and mobility: Secondary | ICD-10-CM | POA: Diagnosis not present

## 2019-12-24 DIAGNOSIS — M6281 Muscle weakness (generalized): Secondary | ICD-10-CM | POA: Diagnosis not present

## 2019-12-24 DIAGNOSIS — R278 Other lack of coordination: Secondary | ICD-10-CM | POA: Diagnosis not present

## 2019-12-24 DIAGNOSIS — I69318 Other symptoms and signs involving cognitive functions following cerebral infarction: Secondary | ICD-10-CM | POA: Diagnosis not present

## 2019-12-24 NOTE — Therapy (Signed)
[facility] [address] [place], [place], [address] Phone: [phone]   Fax:  [phone]  Speech Language Pathology Treatment  Patient Details  Name: Tara [name] MRN: [id number] Date of Birth: April 28, 1966 Referring Provider (SLP): Frann Rider NP   Encounter Date: 12/24/2019   End of Session - 12/24/19 1309    Visit Number 18    Number of Visits 23    Date for SLP Re-Evaluation 01/16/20    SLP Start Time 3403   pt 6 minutes late   SLP Stop Time  1230    SLP Time Calculation (min) 38 min    Activity Tolerance Patient tolerated treatment well;Other (comment)           Past Medical History:  Diagnosis Date  . Asthma   . Hyperlipidemia   . Migraine   . Rotator cuff tear   . Spondylolisthesis    L4-5    Past Surgical History:  Procedure Laterality Date  . BUBBLE STUDY  07/30/2019   Procedure: BUBBLE STUDY;  Surgeon: Donato Heinz, MD;  Location: [facility];  Service: Cardiovascular;;  . LOOP RECORDER INSERTION N/A 07/30/2019   Procedure: LOOP RECORDER INSERTION;  Surgeon: Evans Lance, MD;  Location: [facility];  Service: Cardiovascular;  Laterality: N/A;  . No prior surgery    . TEE WITHOUT CARDIOVERSION N/A 07/30/2019   Procedure: TRANSESOPHAGEAL ECHOCARDIOGRAM (TEE);  Surgeon: Donato Heinz, MD;  Location: [facility] ENDOSCOPY;  Service: Cardiovascular;  Laterality: N/A;    There were no vitals filed for this visit.          ADULT SLP TREATMENT - 12/24/19 1210      General Information   Behavior/Cognition Alert;Cooperative;Pleasant mood      Treatment Provided   Treatment provided Cognitive-Linquistic      Cognitive-Linquistic Treatment   Treatment focused on Dysarthria;Patient/family/caregiver education;Cognition    Skilled Treatment 19 mintues cognition: Pt and SLP discussed pt's return to work - SLP suggested neuropsych to assess if pt has cognitive level to  return to work. Pt has appointmetn 03-10-20. Pt tells SLP stores are much more difficult visually - she gets headache or vertigo which last 4-5 hours after onset. [employer] is worst. SLP suggested pt go at [place] busy times - pt reports she is already doing this. SLP also suggested wearing a ball cap in addition to sunglasses to diminish visual stimulation - which pt stated SLP [place] suggested. (speech tx individual)  Loud /a/upper 70s dB initially but this improved to low 80s dB with consistent SLP cues for loudness. In sentence responses pt mantained WNL volulme 100% of the time.  Incerasing cognitive load pt maintained WNL 85% of the time. Told pt to practice WNL voice volume 30 iminutes/day      Assessment / Recommendations / Plan   Plan Continue with current plan of care      Progression Toward Goals   Progression toward goals Progressing toward goals              SLP Short Term Goals - 12/17/19 1308      SLP SHORT TERM GOAL #1   Title Pt will utiltize external aids to schedule and complete daily tasks with rare min A over 3 sessions    Baseline 10-28-19, 10-29-19    Status Partially Met      SLP SHORT TERM GOAL #2   Title Pt will uitlize compensatory strategies to attend to and process verbal instructions and  converstion accurately with rare min A over 2 sessions    Baseline 10-28-19, 10-29-19    Status On-going      SLP SHORT TERM GOAL #3   Title Pt will read 1 paragraph (5-7 sentences) and recall details with rare min A over 2 sessions    Baseline 10-28-19    Status Achieved            SLP Long Term Goals - 12/24/19 1310      SLP LONG TERM GOAL #1   Title Pt will utilize compensations for attention to prepare a main dish for 3 meals with rare min A    Baseline 10-28-19, 10-29-19, 11-05-19    Status Achieved      SLP LONG TERM GOAL #2   Title Pt will carryover compensatory strategies for attention and processing to run 3 errands in a row successfully with rare min A    Baseline  11-07-19, 11-11-19    Status Achieved      SLP LONG TERM GOAL #3   Title Pt will return to ADL/IADL task after a distraction and succesfully complete task before starting a new activity with rare min A over 3 sessions    Baseline 11-05-19, 6-11-216-15-21    Status Achieved      SLP LONG TERM GOAL #4   Title pt will incr voice volume in 8 minutes simple conversation to low 70s in 2 sessions    Time 2   renewed 12-08-19   Period Weeks    Status On-going            Plan - 12/24/19 1310    Clinical Impression Statement Tara [name] continues to present with at least mild but improving cognitive linguistic impairments of attention, processing, and visuospatial skills. See "skilled intervention" for more details. Work contiues on improving the strength of her voice, which she states was much stronger premorbidly. She has some voice strength exercises she is completing at this time. Ongoing training of compensations for cogntive impairments for successful completion of IADL's, intelligiblilty. Continue skilled ST    Speech Therapy Frequency 2x / week    Duration --   8 weeks or 17 visits   Treatment/Interventions Language facilitation;Environmental controls;Cueing hierarchy;SLP instruction and feedback;Compensatory strategies;Functional tasks;Cognitive reorganization;Patient/family education;Multimodal communcation approach;Internal/external aids;Compensatory techniques    Potential to Achieve Goals Good           Patient will benefit from skilled therapeutic intervention in order to improve the following deficits and impairments:   Cognitive communication deficit  Dysarthria and anarthria    Problem List Patient Active Problem List   Diagnosis Date Noted  . CVA (cerebral vascular accident) ([facility]) 07/29/2019  . Dysphagia 07/29/2019  . Left hand weakness 07/29/2019  . Palpitations 09/15/2015  . Diastolic dysfunction 16/94/5038  . Renal cyst, left 06/19/2013  . History of migraine headaches  03/05/2013  . Complicated migraine 88/28/0034  . HLD (hyperlipidemia) 03/05/2013  . 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 Rotator cuff syndrome 03/05/2013  . Low back pain 03/05/2013    Lieutenant Abarca/,MS, CCC-SLP  12/24/2019, 1:11 PM  [facility] [address] [place], [place], [address] Phone: [phone]   Fax:  [phone]   Name: Tara [name] MRN: [id number] Date of Birth: 04/11/1966

## 2019-12-25 ENCOUNTER — Ambulatory Visit: Payer: 59

## 2019-12-25 ENCOUNTER — Ambulatory Visit
Admission: RE | Admit: 2019-12-25 | Discharge: 2019-12-25 | Disposition: A | Discharge status: Home / Self Care | Payer: 59 | Source: Ambulatory Visit | Attending: Family Medicine | Admitting: Family Medicine

## 2019-12-25 DIAGNOSIS — R928 Other abnormal and inconclusive findings on diagnostic imaging of breast: Secondary | ICD-10-CM | POA: Diagnosis not present

## 2019-12-25 DIAGNOSIS — N6012 Diffuse cystic mastopathy of left breast: Secondary | ICD-10-CM | POA: Diagnosis not present

## 2019-12-25 DIAGNOSIS — R41841 Cognitive communication deficit: Secondary | ICD-10-CM

## 2019-12-25 DIAGNOSIS — R278 Other lack of coordination: Secondary | ICD-10-CM | POA: Diagnosis not present

## 2019-12-25 DIAGNOSIS — R2689 Other abnormalities of gait and mobility: Secondary | ICD-10-CM | POA: Diagnosis not present

## 2019-12-25 DIAGNOSIS — R471 Dysarthria and anarthria: Secondary | ICD-10-CM | POA: Diagnosis not present

## 2019-12-25 DIAGNOSIS — I69318 Other symptoms and signs involving cognitive functions following cerebral infarction: Secondary | ICD-10-CM | POA: Diagnosis not present

## 2019-12-25 DIAGNOSIS — R2681 Unsteadiness on feet: Secondary | ICD-10-CM | POA: Diagnosis not present

## 2019-12-25 DIAGNOSIS — M6281 Muscle weakness (generalized): Secondary | ICD-10-CM | POA: Diagnosis not present

## 2019-12-25 NOTE — Therapy (Signed)
[facility] [address] [place], [place], [address] Phone: [phone]   Fax:  [phone]  Speech Language Pathology Treatment  Patient Details  Name: Tara [name] MRN: [id number] Date of Birth: [birth year] Referring Provider (SLP): Frann Rider NP   Encounter Date: 12/25/2019   End of Session - 12/25/19 1723    Visit Number 19    Number of Visits 23    Date for SLP Re-Evaluation 01/16/20    SLP Start Time 6378    SLP Stop Time  1102    SLP Time Calculation (min) 24 min    Activity Tolerance Patient tolerated treatment well;Other (comment)           Past Medical History:  Diagnosis Date  . Asthma   . Hyperlipidemia   . Migraine   . Rotator cuff tear   . Spondylolisthesis    L4-5    Past Surgical History:  Procedure Laterality Date  . BUBBLE STUDY  07/30/2019   Procedure: BUBBLE STUDY;  Surgeon: Donato Heinz, MD;  Location: [facility];  Service: Cardiovascular;;  . LOOP RECORDER INSERTION N/A 07/30/2019   Procedure: LOOP RECORDER INSERTION;  Surgeon: Evans Lance, MD;  Location: [facility];  Service: Cardiovascular;  Laterality: N/A;  . No prior surgery    . TEE WITHOUT CARDIOVERSION N/A 07/30/2019   Procedure: TRANSESOPHAGEAL ECHOCARDIOGRAM (TEE);  Surgeon: Donato Heinz, MD;  Location: [facility] ENDOSCOPY;  Service: Cardiovascular;  Laterality: N/A;    There were no vitals filed for this visit.   Subjective Assessment - 12/25/19 1041    Currently in Pain? Yes    Pain Score 1     Pain Location Finger (Comment which one)   middle, ring   Pain Orientation Left    Pain Descriptors / Indicators Numbness;Tingling    Pain Type Chronic pain    Pain Radiating Towards to forearm                 ADULT SLP TREATMENT - 12/25/19 1045      General Information   Behavior/Cognition Alert;Cooperative;Pleasant mood      Treatment Provided   Treatment provided  Cognitive-Linquistic      Cognitive-Linquistic Treatment   Treatment focused on Dysarthria;Patient/family/caregiver education;Cognition    Skilled Treatment Pt 22 minutes late due to previous appointment this morning. SLP worked with pt on loudness - /a/ and Hey, and "hey-/a/" - pt complained of pressure in her temples - volume initially upper 70s but after cues from SLP for loudness low-mid 80s dB. In sentence responses pt maintained volume in low 70s dB 70% of the time with rare min A. SLP provided homework for pt for sentences.       Assessment / Recommendations / Plan   Plan Continue with current plan of care      Progression Toward Goals   Progression toward goals Progressing toward goals              SLP Short Term Goals - 12/17/19 1308      SLP SHORT TERM GOAL #1   Title Pt will utiltize external aids to schedule and complete daily tasks with rare min A over 3 sessions    Baseline 10-28-19, 10-29-19    Status Partially Met      SLP SHORT TERM GOAL #2   Title Pt will uitlize compensatory strategies to attend to and process verbal instructions and converstion accurately with rare min A over 2 sessions  Baseline 10-28-19, 10-29-19    Status On-going      SLP SHORT TERM GOAL #3   Title Pt will read 1 paragraph (5-7 sentences) and recall details with rare min A over 2 sessions    Baseline 10-28-19    Status Achieved            SLP Long Term Goals - 12/25/19 1724      SLP LONG TERM GOAL #1   Title Pt will utilize compensations for attention to prepare a main dish for 3 meals with rare min A    Baseline 10-28-19, 10-29-19, 11-05-19    Status Achieved      SLP LONG TERM GOAL #2   Title Pt will carryover compensatory strategies for attention and processing to run 3 errands in a row successfully with rare min A    Baseline 11-07-19, 11-11-19    Status Achieved      SLP LONG TERM GOAL #3   Title Pt will return to ADL/IADL task after a distraction and succesfully complete task before  starting a new activity with rare min A over 3 sessions    Baseline 11-05-19, 6-11-216-15-21    Status Achieved      SLP LONG TERM GOAL #4   Title pt will incr voice volume in 8 minutes simple conversation to low 70s in 2 sessions    Time 2   renewed 12-08-19   Period Weeks    Status On-going            Plan - 12/25/19 1723    Clinical Impression Statement Barrie continues to present with at least mild but improving cognitive linguistic impairments of attention, processing, and visuospatial skills. See "skilled intervention" for more details. Work contiues on improving the strength of her voice, which she states was much stronger premorbidly. She has some voice strength exercises she is completing at this time. Ongoing training of compensations for cogntive impairments for successful completion of IADL's, intelligiblilty. Continue skilled ST    Speech Therapy Frequency 2x / week    Duration --   8 weeks or 17 visits   Treatment/Interventions Language facilitation;Environmental controls;Cueing hierarchy;SLP instruction and feedback;Compensatory strategies;Functional tasks;Cognitive reorganization;Patient/family education;Multimodal communcation approach;Internal/external aids;Compensatory techniques    Potential to Achieve Goals Good           Patient will benefit from skilled therapeutic intervention in order to improve the following deficits and impairments:   Dysarthria and anarthria  Cognitive communication deficit    Problem List Patient Active Problem List   Diagnosis Date Noted  . CVA (cerebral vascular accident) ([facility]) 07/29/2019  . Dysphagia 07/29/2019  . Left hand weakness 07/29/2019  . Palpitations 09/15/2015  . Diastolic dysfunction 72/53/6644  . Renal cyst, left 06/19/2013  . History of migraine headaches 03/05/2013  . Complicated migraine 03/47/4259  . HLD (hyperlipidemia) 03/05/2013  . 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 Rotator cuff syndrome 03/05/2013  . Low  back pain 03/05/2013    [facility] ,[place], CCC-SLP  12/25/2019, 5:24 PM  [facility] [address] [place], [place], [address] Phone: [phone]   Fax:  [phone]   Name: Nevea Spiewak MRN: [id number] Date of Birth: 04-27-1966

## 2019-12-29 ENCOUNTER — Other Ambulatory Visit: Payer: Self-pay

## 2019-12-29 ENCOUNTER — Ambulatory Visit: Payer: 59 | Attending: Family Medicine

## 2019-12-29 DIAGNOSIS — R41841 Cognitive communication deficit: Secondary | ICD-10-CM | POA: Insufficient documentation

## 2019-12-29 DIAGNOSIS — R471 Dysarthria and anarthria: Secondary | ICD-10-CM | POA: Diagnosis not present

## 2019-12-29 NOTE — Therapy (Signed)
[facility] [address] [place], [place], [address] Phone: [phone]   Fax:  [phone]  Speech Language Pathology Treatment  Patient Details  Name: Tara [name] MRN: [id number] Date of Birth: Nov 28, 1965 Referring Provider (SLP): Frann Rider NP   Encounter Date: 12/29/2019   End of Session - 12/29/19 1257    Visit Number 20    Number of Visits 23    Date for SLP Re-Evaluation 01/16/20    SLP Start Time 1153   7 minutes late   SLP Stop Time  71    SLP Time Calculation (min) 37 min    Activity Tolerance Patient tolerated treatment well           Past Medical History:  Diagnosis Date  . Asthma   . Hyperlipidemia   . Migraine   . Rotator cuff tear   . Spondylolisthesis    L4-5    Past Surgical History:  Procedure Laterality Date  . BUBBLE STUDY  07/30/2019   Procedure: BUBBLE STUDY;  Surgeon: Donato Heinz, MD;  Location: [facility];  Service: Cardiovascular;;  . LOOP RECORDER INSERTION N/A 07/30/2019   Procedure: LOOP RECORDER INSERTION;  Surgeon: Evans Lance, MD;  Location: [facility];  Service: Cardiovascular;  Laterality: N/A;  . No prior surgery    . TEE WITHOUT CARDIOVERSION N/A 07/30/2019   Procedure: TRANSESOPHAGEAL ECHOCARDIOGRAM (TEE);  Surgeon: Donato Heinz, MD;  Location: [facility] ENDOSCOPY;  Service: Cardiovascular;  Laterality: N/A;    There were no vitals filed for this visit.   Subjective Assessment - 12/29/19 1156    Subjective Pt is written out of work until October, at time of next visit with PCP.    Currently in Pain? Yes    Pain Score 2     Pain Location Finger (Comment which one)   ring and middle   Pain Orientation Left    Pain Descriptors / Indicators Numbness                 ADULT SLP TREATMENT - 12/29/19 1158      General Information   Behavior/Cognition Alert;Cooperative;Pleasant mood      Treatment Provided   Treatment  provided Cognitive-Linquistic      Cognitive-Linquistic Treatment   Treatment focused on Dysarthria;Cognition    Skilled Treatment "I felt silly" - pt practiced ah twice a day (5 reps each). Today pt's "ah" was consistently at mid 80s dB. In multi-sentence responses pt maintained average dB in low 70s dB - when cognitive load incr'd pt req'd usual min cues for loudness in order to maintain WNL loudness.      Assessment / Recommendations / Plan   Plan Continue with current plan of care      Progression Toward Goals   Progression toward goals Progressing toward goals              SLP Short Term Goals - 12/17/19 1308      SLP SHORT TERM GOAL #1   Title Pt will utiltize external aids to schedule and complete daily tasks with rare min A over 3 sessions    Baseline 10-28-19, 10-29-19    Status Partially Met      SLP SHORT TERM GOAL #2   Title Pt will uitlize compensatory strategies to attend to and process verbal instructions and converstion accurately with rare min A over 2 sessions    Baseline 10-28-19, 10-29-19    Status On-going  SLP SHORT TERM GOAL #3   Title Pt will read 1 paragraph (5-7 sentences) and recall details with rare min A over 2 sessions    Baseline 10-28-19    Status Achieved            SLP Long Term Goals - 12/29/19 1259      SLP LONG TERM GOAL #1   Title Pt will utilize compensations for attention to prepare a main dish for 3 meals with rare min A    Baseline 10-28-19, 10-29-19, 11-05-19    Status Achieved      SLP LONG TERM GOAL #2   Title Pt will carryover compensatory strategies for attention and processing to run 3 errands in a row successfully with rare min A    Baseline 11-07-19, 11-11-19    Status Achieved      SLP LONG TERM GOAL #3   Title Pt will return to ADL/IADL task after a distraction and succesfully complete task before starting a new activity with rare min A over 3 sessions    Baseline 11-05-19, 6-11-216-15-21    Status Achieved      SLP LONG TERM  GOAL #4   Title pt will incr voice volume in 8 minutes simple conversation to low 70s in 2 sessions    Time 2   renewed 12-08-19   Period Weeks    Status On-going            Plan - 12/29/19 1258    Clinical Impression Statement Tara [name] continues to present with at least mild but improving cognitive linguistic impairments of attention, processing, and visuospatial skills. See "skilled intervention" for more details. Work contiues on improving the strength of her voice, which she states was much stronger premorbidly. She continues to report completing voice strength exercises regularly. Ongoing training of compensations for cogntive impairments for successful completion of IADL's, intelligiblilty. Continue skilled ST    Speech Therapy Frequency 2x / week    Duration --   8 weeks or 17 visits   Treatment/Interventions Language facilitation;Environmental controls;Cueing hierarchy;SLP instruction and feedback;Compensatory strategies;Functional tasks;Cognitive reorganization;Patient/family education;Multimodal communcation approach;Internal/external aids;Compensatory techniques    Potential to Achieve Goals Good           Patient will benefit from skilled therapeutic intervention in order to improve the following deficits and impairments:   Dysarthria and anarthria  Cognitive communication deficit    Problem List Patient Active Problem List   Diagnosis Date Noted  . CVA (cerebral vascular accident) ([facility]) 07/29/2019  . Dysphagia 07/29/2019  . Left hand weakness 07/29/2019  . Palpitations 09/15/2015  . Diastolic dysfunction 38/33/3832  . Renal cyst, left 06/19/2013  . History of migraine headaches 03/05/2013  . Complicated migraine 91/91/6606  . HLD (hyperlipidemia) 03/05/2013  . 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 Rotator cuff syndrome 03/05/2013  . Low back pain 03/05/2013    [facility] ,MS, CCC-SLP  12/29/2019, 1:01 PM  [facility] [address] [place], [place], [address] Phone: [phone]   Fax:  [phone]   Name: Tara [name] MRN: [id number] Date of Birth: 08-Dec-1965

## 2019-12-31 ENCOUNTER — Ambulatory Visit: Payer: 59

## 2019-12-31 ENCOUNTER — Other Ambulatory Visit: Payer: Self-pay

## 2019-12-31 DIAGNOSIS — R471 Dysarthria and anarthria: Secondary | ICD-10-CM | POA: Diagnosis not present

## 2019-12-31 DIAGNOSIS — R41841 Cognitive communication deficit: Secondary | ICD-10-CM

## 2019-12-31 NOTE — Therapy (Signed)
[facility] [address] [place], [place], [address] Phone: [phone]   Fax:  [phone]  Speech Language Pathology Treatment  Patient Details  Name: Tara [name] MRN: [id number] Date of Birth: 11-07-1965 Referring Provider (SLP): Frann Rider NP   Encounter Date: 12/31/2019   End of Session - 12/31/19 1237    Visit Number 21    Number of Visits 23    Date for SLP Re-Evaluation 01/16/20    SLP Start Time 1020   4 minutes late   SLP Stop Time  1103    SLP Time Calculation (min) 43 min    Activity Tolerance Patient tolerated treatment well           Past Medical History:  Diagnosis Date  . Asthma   . Hyperlipidemia   . Migraine   . Rotator cuff tear   . Spondylolisthesis    L4-5    Past Surgical History:  Procedure Laterality Date  . BUBBLE STUDY  07/30/2019   Procedure: BUBBLE STUDY;  Surgeon: Donato Heinz, MD;  Location: [facility];  Service: Cardiovascular;;  . LOOP RECORDER INSERTION N/A 07/30/2019   Procedure: LOOP RECORDER INSERTION;  Surgeon: Evans Lance, MD;  Location: [facility];  Service: Cardiovascular;  Laterality: N/A;  . No prior surgery    . TEE WITHOUT CARDIOVERSION N/A 07/30/2019   Procedure: TRANSESOPHAGEAL ECHOCARDIOGRAM (TEE);  Surgeon: Donato Heinz, MD;  Location: [facility] ENDOSCOPY;  Service: Cardiovascular;  Laterality: N/A;    There were no vitals filed for this visit.          ADULT SLP TREATMENT - 12/31/19 1023      General Information   Behavior/Cognition Alert;Cooperative;Pleasant mood      Treatment Provided   Treatment provided Cognitive-Linquistic      Cognitive-Linquistic Treatment   Treatment focused on Cognition;Dysarthria    Skilled Treatment SLP had pt practice loud /a/ 5 reps in order to habitualize louder conversational speech, consistently at mid 80s dB. Pt read short paragraphs and questions/answers with WNL volume.  Little carryover into side-conversation without usual min SLP cues but over 10 minutes pt was more successful with loudness and SLP was able to fade cues to rare.  Pt talked about 2 situations that she demonstrated anticipatory awareness for her memory and organization abilities.      Assessment / Recommendations / Plan   Plan Continue with current plan of care      Progression Toward Goals   Progression toward goals Progressing toward goals            SLP Education - 12/31/19 1237    Education Details external cues for loudness    Person(s) Educated Patient    Methods Explanation    Comprehension Verbalized understanding            SLP Short Term Goals - 12/17/19 1308      SLP SHORT TERM GOAL #1   Title Pt will utiltize external aids to schedule and complete daily tasks with rare min A over 3 sessions    Baseline 10-28-19, 10-29-19    Status Partially Met      SLP SHORT TERM GOAL #2   Title Pt will uitlize compensatory strategies to attend to and process verbal instructions and converstion accurately with rare min A over 2 sessions    Baseline 10-28-19, 10-29-19    Status On-going      SLP SHORT TERM GOAL #3   Title Pt  will read 1 paragraph (5-7 sentences) and recall details with rare min A over 2 sessions    Baseline 10-28-19    Status Achieved            SLP Long Term Goals - 12/31/19 1235      SLP LONG TERM GOAL #1   Title Pt will utilize compensations for attention to prepare a main dish for 3 meals with rare min A    Baseline 10-28-19, 10-29-19, 11-05-19    Status Achieved      SLP LONG TERM GOAL #2   Title Pt will carryover compensatory strategies for attention and processing to run 3 errands in a row successfully with rare min A    Baseline 11-07-19, 11-11-19    Status Achieved      SLP LONG TERM GOAL #3   Title Pt will return to ADL/IADL task after a distraction and succesfully complete task before starting a new activity with rare min A over 3 sessions    Baseline  11-05-19, 6-11-216-15-21    Status Achieved      SLP LONG TERM GOAL #4   Title pt will incr voice volume in 8 minutes simple conversation to low 70s in 2 sessions    Time 2   renewed 12-08-19   Period Weeks    Status On-going            Plan - 12/31/19 1237    Clinical Impression Statement Tara [name] continues to show mild and improving cognitive linguistic impairments of attention, processing, and visuospatial skills. See "skilled intervention" for more details. Work contiues on improving the strength of her voice, which she states was much stronger premorbidly. She continues to report completing voice strength exercises regularly. Ongoing training of compensations for cogntive impairments for successful completion of IADL's, intelligiblilty. Continue skilled ST    Speech Therapy Frequency 2x / week    Duration --   8 weeks or 17 visits   Treatment/Interventions Language facilitation;Environmental controls;Cueing hierarchy;SLP instruction and feedback;Compensatory strategies;Functional tasks;Cognitive reorganization;Patient/family education;Multimodal communcation approach;Internal/external aids;Compensatory techniques    Potential to Achieve Goals Good           Patient will benefit from skilled therapeutic intervention in order to improve the following deficits and impairments:   Dysarthria and anarthria  Cognitive communication deficit    Problem List Patient Active Problem List   Diagnosis Date Noted  . CVA (cerebral vascular accident) ([facility]) 07/29/2019  . Dysphagia 07/29/2019  . Left hand weakness 07/29/2019  . Palpitations 09/15/2015  . Diastolic dysfunction 42/59/5638  . Renal cyst, left 06/19/2013  . History of migraine headaches 03/05/2013  . Complicated migraine 75/64/3329  . HLD (hyperlipidemia) 03/05/2013  . 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 Rotator cuff syndrome 03/05/2013  . Low back pain 03/05/2013    [facility] ,MS, CCC-SLP  12/31/2019, 12:39 PM  [facility] [address] [place], [place], [address] Phone: [phone]   Fax:  [phone]   Name: Tara [name] MRN: [id number] Date of Birth: Mar 03, 1966

## 2020-01-02 ENCOUNTER — Other Ambulatory Visit: Payer: 59

## 2020-01-05 ENCOUNTER — Other Ambulatory Visit: Payer: Self-pay

## 2020-01-05 ENCOUNTER — Ambulatory Visit: Payer: 59

## 2020-01-05 DIAGNOSIS — R471 Dysarthria and anarthria: Secondary | ICD-10-CM | POA: Diagnosis not present

## 2020-01-05 DIAGNOSIS — R41841 Cognitive communication deficit: Secondary | ICD-10-CM | POA: Diagnosis not present

## 2020-01-07 NOTE — Therapy (Signed)
[facility] [address] [place], [place], [address] Phone: [phone]   Fax:  [phone]  Speech Language Pathology Treatment  Patient Details  Name: Tara [name] MRN: [id number] Date of Birth: [birth year] Referring Provider (SLP): Frann Rider NP   Encounter Date: 01/05/2020   End of Session - 01/07/20 1101    Visit Number 22    Number of Visits 31    Date for SLP Re-Evaluation 02/13/20    SLP Start Time 1151    SLP Stop Time  1231    SLP Time Calculation (min) 40 min    Activity Tolerance Patient tolerated treatment well           Past Medical History:  Diagnosis Date  . Asthma   . Hyperlipidemia   . Migraine   . Rotator cuff tear   . Spondylolisthesis    L4-5    Past Surgical History:  Procedure Laterality Date  . BUBBLE STUDY  07/30/2019   Procedure: BUBBLE STUDY;  Surgeon: Donato Heinz, MD;  Location: [facility];  Service: Cardiovascular;;  . LOOP RECORDER INSERTION N/A 07/30/2019   Procedure: LOOP RECORDER INSERTION;  Surgeon: Evans Lance, MD;  Location: [facility];  Service: Cardiovascular;  Laterality: N/A;  . No prior surgery    . TEE WITHOUT CARDIOVERSION N/A 07/30/2019   Procedure: TRANSESOPHAGEAL ECHOCARDIOGRAM (TEE);  Surgeon: Donato Heinz, MD;  Location: [facility] ENDOSCOPY;  Service: Cardiovascular;  Laterality: N/A;    There were no vitals filed for this visit.          ADULT SLP TREATMENT - 01/07/20 0001      General Information   Behavior/Cognition Alert;Cooperative;Pleasant mood      Treatment Provided   Treatment provided Cognitive-Linquistic      Cognitive-Linquistic Treatment   Treatment focused on Dysarthria;Cognition    Skilled Treatment SLP noted pt's voice at average upper 60s - low 70s dB upon entering ST room today. Loud /a/ 5 reps in order to habitualize louder conversational speech, consistently at low to mid 80s dB. Pt engaged  in 5 minutes conversation/monologue with SLP in low 70s dB. Pt read 2-3 short paragraphs (10-14 sentences) and answered questions about them with average low 70s-upper 60s dB. Then SLP asked pt qeustion about the topic of the paragraph for short 2+-minute monologue with average low-70s dB      Assessment / Recommendations / [place] with current plan of care;Goals updated      Progression Toward Goals   Progression toward goals Progressing toward goals              SLP Short Term Goals - 12/17/19 1308      SLP SHORT TERM GOAL #1   Title Pt will utiltize external aids to schedule and complete daily tasks with rare min A over 3 sessions    Baseline 10-28-19, 10-29-19    Status Partially Met      SLP SHORT TERM GOAL #2   Title Pt will uitlize compensatory strategies to attend to and process verbal instructions and converstion accurately with rare min A over 2 sessions    Baseline 10-28-19, 10-29-19    Status On-going      SLP SHORT TERM GOAL #3   Title Pt will read 1 paragraph (5-7 sentences) and recall details with rare min A over 2 sessions    Baseline 10-28-19    Status Achieved  SLP Long Term Goals - 01/07/20 1103      SLP LONG TERM GOAL #1   Title Pt will utilize compensations for attention to prepare a main dish for 3 meals with rare min A    Baseline 10-28-19, 10-29-19, 11-05-19    Status Achieved      SLP LONG TERM GOAL #2   Title Pt will carryover compensatory strategies for attention and processing to run 3 errands in a row successfully with rare min A    Baseline 11-07-19, 11-11-19    Status Achieved      SLP LONG TERM GOAL #3   Title Pt will return to ADL/IADL task after a distraction and succesfully complete task before starting a new activity with rare min A over 3 sessions    Baseline 11-05-19, 6-11-216-15-21    Status Achieved      SLP LONG TERM GOAL #4   Title pt will incr voice volume in 8 minutes simple conversation to low 70s in 2 sessions     Time 5   renewed 01-07-20 for 5 weeks   Period Weeks    Status On-going      SLP LONG TERM GOAL #5   Title pt will demo compensatory strategies or report using compensations at home for deficits in attention in or between 4 therapy sessions    Time [place] - 01/07/20 1101    Clinical Impression Statement Tora continues to show mild and improving cognitive linguistic impairments of attention, processing, and visuospatial skills. The strength of her voice continues to improve as well, in sentence and short conversation (~2 minutes in length). She continues to report completing voice strength exercises regularly. See "skilled intervention" for more details. Ongoing training of compensations for cogntive impairments for successful completion of IADL's, intelligiblilty. Continue skilled ST    Speech Therapy Frequency 2x / week    Duration --   5 weeks or 31 total visits   Treatment/Interventions Language facilitation;Environmental controls;Cueing hierarchy;SLP instruction and feedback;Compensatory strategies;Functional tasks;Cognitive reorganization;Patient/family education;Multimodal communcation approach;Internal/external aids;Compensatory techniques    Potential to Achieve Goals Good           Patient will benefit from skilled therapeutic intervention in order to improve the following deficits and impairments:   Dysarthria and anarthria  Cognitive communication deficit    Problem List Patient Active Problem List   Diagnosis Date Noted  . CVA (cerebral vascular accident) ([place]) 07/29/2019  . Dysphagia 07/29/2019  . Left hand weakness 07/29/2019  . Palpitations 09/15/2015  . Diastolic dysfunction 72/01/4708  . Renal cyst, left 06/19/2013  . History of migraine headaches 03/05/2013  . Complicated migraine 62/83/6629  . HLD (hyperlipidemia) 03/05/2013  . 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 Rotator cuff syndrome 03/05/2013  . Low back  pain 03/05/2013    [facility] ,MS, CCC-SLP  01/07/2020, 11:33 AM  [facility] [address] [place], [place], [address] Phone: [phone]   Fax:  [phone]   Name: Lasondra Hodgkins MRN: [id number] Date of Birth: 23-Dec-1965

## 2020-01-12 ENCOUNTER — Ambulatory Visit: Payer: 59

## 2020-01-12 ENCOUNTER — Other Ambulatory Visit: Payer: Self-pay

## 2020-01-12 DIAGNOSIS — R471 Dysarthria and anarthria: Secondary | ICD-10-CM

## 2020-01-12 DIAGNOSIS — R41841 Cognitive communication deficit: Secondary | ICD-10-CM

## 2020-01-12 NOTE — Therapy (Signed)
[facility] [address] [place], [place], [address] Phone: [phone]   Fax:  [phone]  Speech Language Pathology Treatment  Patient Details  Name: Tara [name] MRN: [id number] Date of Birth: 1966/02/26 Referring Provider (SLP): Frann Rider NP   Encounter Date: 01/12/2020   End of Session - 01/12/20 1324    Visit Number 23    Number of Visits 31    Date for SLP Re-Evaluation 02/13/20    SLP Start Time 1105    SLP Stop Time  1146    SLP Time Calculation (min) 41 min    Activity Tolerance Patient tolerated treatment well           Past Medical History:  Diagnosis Date  . Asthma   . Hyperlipidemia   . Migraine   . Rotator cuff tear   . Spondylolisthesis    L4-5    Past Surgical History:  Procedure Laterality Date  . BUBBLE STUDY  07/30/2019   Procedure: BUBBLE STUDY;  Surgeon: Donato Heinz, MD;  Location: [facility];  Service: Cardiovascular;;  . LOOP RECORDER INSERTION N/A 07/30/2019   Procedure: LOOP RECORDER INSERTION;  Surgeon: Evans Lance, MD;  Location: [facility] CV LAB;  Service: Cardiovascular;  Laterality: N/A;  . No prior surgery    . TEE WITHOUT CARDIOVERSION N/A 07/30/2019   Procedure: TRANSESOPHAGEAL ECHOCARDIOGRAM (TEE);  Surgeon: Donato Heinz, MD;  Location: [facility] ENDOSCOPY;  Service: Cardiovascular;  Laterality: N/A;    There were no vitals filed for this visit.   Subjective Assessment - 01/12/20 1112    Currently in Pain? Yes    Pain Score 2     Pain Location Finger (Comment which one)   middle over to little finger   Pain Orientation Left    Pain Descriptors / Indicators Burning;Aching;Numbness    Pain Type Chronic pain    Pain Radiating Towards to elbow                 ADULT SLP TREATMENT - 01/12/20 1113      General Information   Behavior/Cognition Alert;Cooperative   flat affect     Treatment Provided   Treatment provided  Cognitive-Linquistic      Cognitive-Linquistic Treatment   Treatment focused on Dysarthria;Cognition    Skilled Treatment Pt reports doing her homework for stronger speech over the weekend. Pt's volume was mid to upper 60s dB prior to formal therapy tasks.  SLP instructed pt to perform 5 loud /a/, pt with average upper70s - low 80s dB and rare min A for volume incr'd pt volume to low -low 80s dB.  In multiple sentence task (pic sequence description) pt req'd occasional min cues to maintain WNL speech volume . Pt states she cont to have people ask her to repeat. SLP encouraged pt to practice daily for louder speech and provided her with sentence level tasks.       Assessment / Recommendations / Plan   Plan Continue with current plan of care      Progression Toward Goals   Progression toward goals Progressing toward goals              SLP Short Term Goals - 12/17/19 1308      SLP SHORT TERM GOAL #1   Title Pt will utiltize external aids to schedule and complete daily tasks with rare min A over 3 sessions    Baseline 10-28-19, 10-29-19    Status Partially Met  SLP SHORT TERM GOAL #2   Title Pt will uitlize compensatory strategies to attend to and process verbal instructions and converstion accurately with rare min A over 2 sessions    Baseline 10-28-19, 10-29-19    Status On-going      SLP SHORT TERM GOAL #3   Title Pt will read 1 paragraph (5-7 sentences) and recall details with rare min A over 2 sessions    Baseline 10-28-19    Status Achieved            SLP Long Term Goals - 01/12/20 1326      SLP LONG TERM GOAL #1   Title Pt will utilize compensations for attention to prepare a main dish for 3 meals with rare min A    Baseline 10-28-19, 10-29-19, 11-05-19    Status Achieved      SLP LONG TERM GOAL #2   Title Pt will carryover compensatory strategies for attention and processing to run 3 errands in a row successfully with rare min A    Baseline 11-07-19, 11-11-19    Status Achieved       SLP LONG TERM GOAL #3   Title Pt will return to ADL/IADL task after a distraction and succesfully complete task before starting a new activity with rare min A over 3 sessions    Baseline 11-05-19, 6-11-216-15-21    Status Achieved      SLP LONG TERM GOAL #4   Title pt will incr voice volume in 8 minutes simple conversation to low 70s in 2 sessions    Time 5   renewed 01-07-20 for 5 weeks   Period Weeks    Status On-going      SLP LONG TERM GOAL #5   Title pt will demo compensatory strategies or report using compensations at home for deficits in attention in or between 4 therapy sessions    Time 5    Period Weeks    Status On-going            Plan - 01/12/20 1325    Clinical Impression Statement Pt's voice strength continues as a major target of ST. She continues to report completing voice strength exercises regularly. See "skilled intervention" for more details. Ongoing training of compensations for cogntive impairments for successful completion of IADL's, intelligiblilty. Continue skilled ST    Speech Therapy Frequency 2x / week    Duration --   5 weeks or 31 total visits   Treatment/Interventions Language facilitation;Environmental controls;Cueing hierarchy;SLP instruction and feedback;Compensatory strategies;Functional tasks;Cognitive reorganization;Patient/family education;Multimodal communcation approach;Internal/external aids;Compensatory techniques    Potential to Achieve Goals Good           Patient will benefit from skilled therapeutic intervention in order to improve the following deficits and impairments:   Dysarthria and anarthria  Cognitive communication deficit    Problem List Patient Active Problem List   Diagnosis Date Noted  . CVA (cerebral vascular accident) ([facility]) 07/29/2019  . Dysphagia 07/29/2019  . Left hand weakness 07/29/2019  . Palpitations 09/15/2015  . Diastolic dysfunction 11/26/1599  . Renal cyst, left 06/19/2013  . History of migraine  headaches 03/05/2013  . Complicated migraine 09/32/3557  . HLD (hyperlipidemia) 03/05/2013  . 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 Rotator cuff syndrome 03/05/2013  . Low back pain 03/05/2013    [facility] ,MS, CCC-SLP  01/12/2020, 1:44 PM  [facility] [address] [place], [place], [address] Phone: [phone]   Fax:  [phone]   Name: Auriella Wieand  [place] MRN: [id number] Date of Birth: Nov 02, 1965

## 2020-01-19 ENCOUNTER — Ambulatory Visit (INDEPENDENT_AMBULATORY_CARE_PROVIDER_SITE_OTHER): Payer: 59 | Admitting: *Deleted

## 2020-01-19 DIAGNOSIS — I639 Cerebral infarction, unspecified: Secondary | ICD-10-CM | POA: Diagnosis not present

## 2020-01-19 LAB — CUP PACEART REMOTE DEVICE CHECK
Date Time Interrogation Session: 20210820230046
Implantable Pulse Generator Implant Date: 20210303

## 2020-01-21 ENCOUNTER — Other Ambulatory Visit: Payer: Self-pay

## 2020-01-21 ENCOUNTER — Encounter: Payer: Self-pay | Admitting: Speech Pathology

## 2020-01-21 ENCOUNTER — Ambulatory Visit: Payer: 59 | Admitting: Speech Pathology

## 2020-01-21 DIAGNOSIS — R41841 Cognitive communication deficit: Secondary | ICD-10-CM | POA: Diagnosis not present

## 2020-01-21 DIAGNOSIS — R471 Dysarthria and anarthria: Secondary | ICD-10-CM

## 2020-01-21 NOTE — Therapy (Signed)
[facility] [address] [place], [place], [address] Phone: [phone]   Fax:  [phone]  Speech Language Pathology Treatment  Patient Details  Name: Tara [name] MRN: [id number] Date of Birth: 1965-08-17 Referring Provider (SLP): Frann Rider NP   Encounter Date: 01/21/2020   End of Session - 01/21/20 1016    Visit Number 24    Number of Visits 31    Date for SLP Re-Evaluation 02/13/20    SLP Start Time 0932    SLP Stop Time  1017    SLP Time Calculation (min) 45 min    Activity Tolerance Patient tolerated treatment well           Past Medical History:  Diagnosis Date  . Asthma   . Hyperlipidemia   . Migraine   . Rotator cuff tear   . Spondylolisthesis    L4-5    Past Surgical History:  Procedure Laterality Date  . BUBBLE STUDY  07/30/2019   Procedure: BUBBLE STUDY;  Surgeon: Donato Heinz, MD;  Location: [facility];  Service: Cardiovascular;;  . LOOP RECORDER INSERTION N/A 07/30/2019   Procedure: LOOP RECORDER INSERTION;  Surgeon: Evans Lance, MD;  Location: [facility] CV LAB;  Service: Cardiovascular;  Laterality: N/A;  . No prior surgery    . TEE WITHOUT CARDIOVERSION N/A 07/30/2019   Procedure: TRANSESOPHAGEAL ECHOCARDIOGRAM (TEE);  Surgeon: Donato Heinz, MD;  Location: [facility] ENDOSCOPY;  Service: Cardiovascular;  Laterality: N/A;    There were no vitals filed for this visit.   Subjective Assessment - 01/21/20 0942    Subjective "I didn't notice it until my husband told me" re: bloodshot left eye    Currently in Pain? Yes    Pain Score 1     Pain Location Finger (Comment which one)    Pain Orientation Left    Pain Descriptors / Indicators Aching;Burning;Numbness    Pain Type Chronic pain    Pain Onset More than a month ago                 ADULT SLP TREATMENT - 01/21/20 0943      General Information   Behavior/Cognition Alert;Cooperative       Treatment Provided   Treatment provided Cognitive-Linquistic      Cognitive-Linquistic Treatment   Treatment focused on Cognition;Dysarthria;Patient/family/caregiver education    Skilled Treatment Pt reports completing HEP at home. Speech volume 65 dB initially in conversation. Pt is managing disability paperwork, getting medical records and forms in timely mangger by taking notes on due dates using calendar and taking notes on the front of folders for social security, [name] and [employer] and medical records.  Filling out forms continues to cause fatigue- She has been called by these companies that she has missed filling out parts of forms. She is requesting the forms be sent back to her becasue she cannot process what they are requesting over the phone. She is advocating for herself and letting these companies know she has difficulty with attention, memory, and processing.  She calls the companies and asks them to walk her through the forms as she fills them out, becuase she misses parts of the forms and this delays the disability process. She continues to go to the grocery getting 5-7 items at a time due to head aches and dizziness.  Loud /a/ to recalibrate volume averaged 84dB. with cues. Structured speech tasks similarities and differences averaged 72dB with visual verbal cues occasionally.  Assessment / Recommendations / Plan   Plan Continue with current plan of care      Progression Toward Goals   Progression toward goals Progressing toward goals            SLP Education - 01/21/20 1015    Education Details keep doing HEP and focusing on volume    Person(s) Educated Patient    Methods Explanation    Comprehension Verbalized understanding            SLP Short Term Goals - 01/21/20 1016      SLP SHORT TERM GOAL #1   Title Pt will utiltize external aids to schedule and complete daily tasks with rare min A over 3 sessions    Baseline 10-28-19, 10-29-19    Status Partially Met        SLP SHORT TERM GOAL #2   Title Pt will uitlize compensatory strategies to attend to and process verbal instructions and converstion accurately with rare min A over 2 sessions    Baseline 10-28-19, 10-29-19    Status On-going      SLP SHORT TERM GOAL #3   Title Pt will read 1 paragraph (5-7 sentences) and recall details with rare min A over 2 sessions    Baseline 10-28-19    Status Achieved            SLP Long Term Goals - 01/21/20 1016      SLP LONG TERM GOAL #1   Title Pt will utilize compensations for attention to prepare a main dish for 3 meals with rare min A    Baseline 10-28-19, 10-29-19, 11-05-19    Status Achieved      SLP LONG TERM GOAL #2   Title Pt will carryover compensatory strategies for attention and processing to run 3 errands in a row successfully with rare min A    Baseline 11-07-19, 11-11-19    Status Achieved      SLP LONG TERM GOAL #3   Title Pt will return to ADL/IADL task after a distraction and succesfully complete task before starting a new activity with rare min A over 3 sessions    Baseline 11-05-19, 6-11-216-15-21    Status Achieved      SLP LONG TERM GOAL #4   Title pt will incr voice volume in 8 minutes simple conversation to low 70s in 2 sessions    Time 5   renewed 01-07-20 for 5 weeks   Period Weeks    Status On-going      SLP LONG TERM GOAL #5   Title pt will demo compensatory strategies or report using compensations at home for deficits in attention in or between 4 therapy sessions    Time 4    Period Weeks    Status On-going            Plan - 01/21/20 1015    Clinical Impression Statement Pt's voice strength continues as a major target of ST. She continues to report completing voice strength exercises regularly. See "skilled intervention" for more details. Ongoing training of compensations for cogntive impairments for successful completion of IADL's, intelligiblilty. Continue skilled ST    Speech Therapy Frequency 2x / week    Duration --   15  weeks or 31 visits   Treatment/Interventions Language facilitation;Environmental controls;Cueing hierarchy;SLP instruction and feedback;Compensatory strategies;Functional tasks;Cognitive reorganization;Patient/family education;Multimodal communcation approach;Internal/external aids;Compensatory techniques    Potential to Achieve Goals Good           Patient will  benefit from skilled therapeutic intervention in order to improve the following deficits and impairments:   Dysarthria and anarthria  Cognitive communication deficit    Problem List Patient Active Problem List   Diagnosis Date Noted  . CVA (cerebral vascular accident) ([place]) 07/29/2019  . Dysphagia 07/29/2019  . Left hand weakness 07/29/2019  . Palpitations 09/15/2015  . Diastolic dysfunction 45/07/8880  . Renal cyst, left 06/19/2013  . History of migraine headaches 03/05/2013  . Complicated migraine 80/07/4915  . HLD (hyperlipidemia) 03/05/2013  . 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 Rotator cuff syndrome 03/05/2013  . Low back pain 03/05/2013    Tara [name], Annye Rusk MS, CCC-SLP 01/21/2020, 10:21 AM  [facility] [address] [place], [place], [address] Phone: [phone]   Fax:  [phone]   Name: Tara [name] MRN: [id number] Date of Birth: 1966/03/24

## 2020-01-22 NOTE — Progress Notes (Signed)
[facility] Summary Report / Loop Recorder 

## 2020-01-26 ENCOUNTER — Other Ambulatory Visit: Payer: Self-pay

## 2020-01-26 ENCOUNTER — Ambulatory Visit: Payer: 59

## 2020-01-26 DIAGNOSIS — R471 Dysarthria and anarthria: Secondary | ICD-10-CM | POA: Diagnosis not present

## 2020-01-26 DIAGNOSIS — R41841 Cognitive communication deficit: Secondary | ICD-10-CM | POA: Diagnosis not present

## 2020-01-26 NOTE — Therapy (Signed)
[facility] [address] [place], [place], [address] Phone: [phone]   Fax:  [phone]  Speech Language Pathology Treatment  Patient Details  Name: Tara [name] MRN: [id number] Date of Birth: 09/15/1965 Referring Provider (SLP): Jessica McCue NP   Encounter Date: 01/26/2020   End of Session - 01/26/20 1230    Visit Number 25    Number of Visits 31    Date for SLP Re-Evaluation 02/13/20    SLP Start Time 1156    SLP Stop Time  1230    SLP Time Calculation (min) 34 min    Activity Tolerance Patient tolerated treatment well           Past Medical History:  Diagnosis Date  . Asthma   . Hyperlipidemia   . Migraine   . Rotator cuff tear   . Spondylolisthesis    L4-5    Past Surgical History:  Procedure Laterality Date  . BUBBLE STUDY  07/30/2019   Procedure: BUBBLE STUDY;  Surgeon: Schumann, Christopher L, MD;  Location: [facility];  Service: Cardiovascular;;  . LOOP RECORDER INSERTION N/A 07/30/2019   Procedure: LOOP RECORDER INSERTION;  Surgeon: Taylor, Gregg W, MD;  Location: [facility];  Service: Cardiovascular;  Laterality: N/A;  . No prior surgery    . TEE WITHOUT CARDIOVERSION N/A 07/30/2019   Procedure: TRANSESOPHAGEAL ECHOCARDIOGRAM (TEE);  Surgeon: Schumann, Christopher L, MD;  Location: [facility];  Service: Cardiovascular;  Laterality: N/A;    There were no vitals filed for this visit.   Subjective Assessment - 01/26/20 1202    Subjective "I called (the insurance contact) as soon as I got the letter on Thursday but I have not heard from her."    Currently in Pain? Yes    Pain Score 4     Pain Location Finger (Comment which one)    Pain Orientation Left    Pain Descriptors / Indicators Burning;Numbness;Aching    Pain Type Chronic pain                 ADULT SLP TREATMENT - 01/26/20 1204      General Information   Behavior/Cognition Alert;Cooperative       Treatment Provided   Treatment provided Cognitive-Linquistic      Cognitive-Linquistic Treatment   Treatment focused on Dysarthria;Cognition    Skilled Treatment Cognition (10 minutes): Pt continues to manage her insurance paperwork - reporting she followed up with insurance this morning regarding her letter last week, but has not heard back yet. Pt reports she and husband went to [employer] yesterday but only was able to get 3 items before the vertigo set in. Pt was reminded she was advised to decr stimulation by hat and/or glasses, and going at a less-busy time. (speech tx-individual): pt spoke with average mid-upper 60s dB entering ST room. Pt reported she wrote the answers to sentence responses and then said them - SLP explained that the purpose was to have pt generate the sentences spontaneously. Pt reports her husband does not seem to be asking her to repeat as much as 3 months ago, and talks softly more consistently when she wakes up. Loud /a/ today produced mid 80s dB consistently with initial SLP cues. Today with multiple-sentence level responses pt maintained WNL loudness 90% of the time. Pt kept her hand on her abdomen as a tactile cue to use more abdominal support. SLP reiterated to pt she needs to practice 30 minutes a day louder speech, at   least.       Assessment / Recommendations / Plan   Plan Continue with current plan of care      Progression Toward Goals   Progression toward goals Progressing toward goals            SLP Education - 01/26/20 1230    Education Details reiterated 30 minutes a day practice    Person(s) Educated Patient    Methods Explanation    Comprehension Verbalized understanding            SLP Short Term Goals - 01/21/20 1016      SLP SHORT TERM GOAL #1   Title Pt will utiltize external aids to schedule and complete daily tasks with rare min A over 3 sessions    Baseline 10-28-19, 10-29-19    Status Partially Met      SLP SHORT TERM GOAL #2   Title Pt  will uitlize compensatory strategies to attend to and process verbal instructions and converstion accurately with rare min A over 2 sessions    Baseline 10-28-19, 10-29-19    Status On-going      SLP SHORT TERM GOAL #3   Title Pt will read 1 paragraph (5-7 sentences) and recall details with rare min A over 2 sessions    Baseline 10-28-19    Status Achieved            SLP Long Term Goals - 01/26/20 1231      SLP LONG TERM GOAL #1   Title Pt will utilize compensations for attention to prepare a main dish for 3 meals with rare min A    Baseline 10-28-19, 10-29-19, 11-05-19    Status Achieved      SLP LONG TERM GOAL #2   Title Pt will carryover compensatory strategies for attention and processing to run 3 errands in a row successfully with rare min A    Baseline 11-07-19, 11-11-19    Status Achieved      SLP LONG TERM GOAL #3   Title Pt will return to ADL/IADL task after a distraction and succesfully complete task before starting a new activity with rare min A over 3 sessions    Baseline 11-05-19, 6-11-216-15-21    Status Achieved      SLP LONG TERM GOAL #4   Title pt will incr voice volume in 8 minutes simple conversation to low 70s in 2 sessions    Time 4   renewed 01-07-20 for 5 weeks   Period Weeks    Status On-going      SLP LONG TERM GOAL #5   Title pt will demo compensatory strategies or report using compensations at home for deficits in attention in or between 4 therapy sessions    Baseline 01-26-20    Time 3    Period Weeks    Status On-going            Plan - 01/26/20 1230    Clinical Impression Statement Pt's voice strength continues as a major target of ST. She continues to report completing voice strength exercises regularly. See "skilled intervention" for more details. Ongoing training of compensations for cogntive impairments for successful completion of IADL's, intelligiblilty. Continue skilled ST    Speech Therapy Frequency 2x / week    Duration --   15 weeks or 31 visits    Treatment/Interventions Language facilitation;Environmental controls;Cueing hierarchy;SLP instruction and feedback;Compensatory strategies;Functional tasks;Cognitive reorganization;Patient/family education;Multimodal communcation approach;Internal/external aids;Compensatory techniques    Potential to Achieve Goals Good  Patient will benefit from skilled therapeutic intervention in order to improve the following deficits and impairments:   Dysarthria and anarthria  Cognitive communication deficit    Problem List Patient Active Problem List   Diagnosis Date Noted  . CVA (cerebral vascular accident) ([facility]) 07/29/2019  . Dysphagia 07/29/2019  . Left hand weakness 07/29/2019  . Palpitations 09/15/2015  . Diastolic dysfunction 09/15/2015  . Renal cyst, left 06/19/2013  . History of migraine headaches 03/05/2013  . 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 HLD (hyperlipidemia) 03/05/2013  . 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 Rotator cuff syndrome 03/05/2013  . Low back pain 03/05/2013    SCHINKE,CARL ,MS, CCC-SLP  01/26/2020, 12:32 PM  [facility] [address] [place], [place], [address] Phone: [phone]   Fax:  [phone]   Name: Tara [name] MRN: [id number] Date of Birth: 06/09/1965 

## 2020-01-26 NOTE — Patient Instructions (Signed)
   Practice 30 minutes a day with your louder speech!

## 2020-01-28 ENCOUNTER — Other Ambulatory Visit: Payer: Self-pay

## 2020-01-28 ENCOUNTER — Ambulatory Visit: Payer: 59 | Attending: Family Medicine | Admitting: Speech Pathology

## 2020-01-28 DIAGNOSIS — R41841 Cognitive communication deficit: Secondary | ICD-10-CM | POA: Insufficient documentation

## 2020-01-28 DIAGNOSIS — R471 Dysarthria and anarthria: Secondary | ICD-10-CM | POA: Insufficient documentation

## 2020-01-28 NOTE — Therapy (Signed)
[facility] [address] [place], [place], [address] Phone: [phone]   Fax:  [phone]  Speech Language Pathology Treatment  Patient Details  Name: Tara [name] MRN: [id number] Date of Birth: May 22, 1966 Referring Provider (SLP): Frann Rider NP   Encounter Date: 01/28/2020   End of Session - 01/28/20 1316    Visit Number 26    Number of Visits 31    Date for SLP Re-Evaluation 02/13/20    SLP Start Time 3893    SLP Stop Time  1315    SLP Time Calculation (min) 34 min    Activity Tolerance Patient tolerated treatment well           Past Medical History:  Diagnosis Date  . Asthma   . Hyperlipidemia   . Migraine   . Rotator cuff tear   . Spondylolisthesis    L4-5    Past Surgical History:  Procedure Laterality Date  . BUBBLE STUDY  07/30/2019   Procedure: BUBBLE STUDY;  Surgeon: Donato Heinz, MD;  Location: [facility];  Service: Cardiovascular;;  . LOOP RECORDER INSERTION N/A 07/30/2019   Procedure: LOOP RECORDER INSERTION;  Surgeon: Evans Lance, MD;  Location: [facility];  Service: Cardiovascular;  Laterality: N/A;  . No prior surgery    . TEE WITHOUT CARDIOVERSION N/A 07/30/2019   Procedure: TRANSESOPHAGEAL ECHOCARDIOGRAM (TEE);  Surgeon: Donato Heinz, MD;  Location: [facility] ENDOSCOPY;  Service: Cardiovascular;  Laterality: N/A;    There were no vitals filed for this visit.   Subjective Assessment - 01/28/20 1247    Subjective "Sometimes" re: phone calls asking her to repeat    Currently in Pain? Yes    Pain Score 4     Pain Location Finger (Comment which one)    Pain Orientation Left    Pain Descriptors / Indicators Burning;Numbness;Aching    Pain Type Chronic pain    Pain Onset More than a month ago    Pain Frequency Intermittent                 ADULT SLP TREATMENT - 01/28/20 1248      General Information   Behavior/Cognition Alert;Cooperative       Treatment Provided   Treatment provided Cognitive-Linquistic      Cognitive-Linquistic Treatment   Treatment focused on Cognition;Dysarthria;Patient/family/caregiver education    Skilled Treatment Pt arrives 11 minutes late. Lyrique continues to use strategies such as self advocating for speaker to slow down, taking notes and repeating what she has heard on business cals. Pt enters room with average of  65dB. Loud /a/ to recalibrate volume. averaged 75dB.  Structured speech tasks at sentence level, she averaged 68dB with occasional min A. In conversation, Halynn requires frequent mod A to average 68dB.      Assessment / Recommendations / Plan   Plan Continue with current plan of care      Progression Toward Goals   Progression toward goals Progressing toward goals              SLP Short Term Goals - 01/28/20 1306      SLP SHORT TERM GOAL #1   Title Pt will utiltize external aids to schedule and complete daily tasks with rare min A over 3 sessions    Baseline 10-28-19, 10-29-19    Status Partially Met      SLP SHORT TERM GOAL #2   Title Pt will uitlize compensatory strategies to attend to and process  verbal instructions and converstion accurately with rare min A over 2 sessions    Baseline 10-28-19, 10-29-19,    Status Achieved      SLP SHORT TERM GOAL #3   Title Pt will read 1 paragraph (5-7 sentences) and recall details with rare min A over 2 sessions    Baseline 10-28-19    Status Achieved            SLP Long Term Goals - 01/28/20 1307      SLP LONG TERM GOAL #1   Title Pt will utilize compensations for attention to prepare a main dish for 3 meals with rare min A    Baseline 10-28-19, 10-29-19, 11-05-19    Status Achieved      SLP LONG TERM GOAL #2   Title Pt will carryover compensatory strategies for attention and processing to run 3 errands in a row successfully with rare min A    Baseline 11-07-19, 11-11-19    Status Achieved      SLP LONG TERM GOAL #3   Title Pt will  return to ADL/IADL task after a distraction and succesfully complete task before starting a new activity with rare min A over 3 sessions    Baseline 11-05-19, 6-11-216-15-21    Status Achieved      SLP LONG TERM GOAL #4   Title pt will incr voice volume in 8 minutes simple conversation to low 70s in 2 sessions    Time 4   renewed 01-07-20 for 5 weeks   Period Weeks    Status On-going      SLP LONG TERM GOAL #5   Title pt will demo compensatory strategies or report using compensations at home for deficits in attention in or between 4 therapy sessions    Baseline 01-26-20; 01/28/20;    Time 3    Period Weeks    Status Achieved            Plan - 01/28/20 1305    Clinical Impression Statement Pt's voice strength continues as a major target of ST. She continues to report completing voice strength exercises regularly. See "skilled intervention" for more details. Ongoing training of compensations for cogntive impairments for successful completion of IADL's, intelligiblilty. Continue skilled ST    Speech Therapy Frequency 2x / week    Duration --   15 weeks or 31 visits   Treatment/Interventions Language facilitation;Environmental controls;Cueing hierarchy;SLP instruction and feedback;Compensatory strategies;Functional tasks;Cognitive reorganization;Patient/family education;Multimodal communcation approach;Internal/external aids;Compensatory techniques    Potential to Achieve Goals Good           Patient will benefit from skilled therapeutic intervention in order to improve the following deficits and impairments:   Dysarthria and anarthria  Cognitive communication deficit    Problem List Patient Active Problem List   Diagnosis Date Noted  . CVA (cerebral vascular accident) ([place]) 07/29/2019  . Dysphagia 07/29/2019  . Left hand weakness 07/29/2019  . Palpitations 09/15/2015  . Diastolic dysfunction 79/49/9718  . Renal cyst, left 06/19/2013  . History of migraine headaches 03/05/2013   . Complicated migraine 20/99/0689  . HLD (hyperlipidemia) 03/05/2013  . 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 Rotator cuff syndrome 03/05/2013  . Low back pain 03/05/2013    Ellen Mayol, Annye Rusk MS, CCC-SLP 01/28/2020, 1:17 PM  [facility] [address] [place], [place], [address] Phone: [phone]   Fax:  [phone]   Name: Kaili Castille MRN: [id number] Date of Birth: Nov 27, 1965

## 2020-02-04 ENCOUNTER — Other Ambulatory Visit: Payer: Self-pay

## 2020-02-04 ENCOUNTER — Ambulatory Visit: Payer: 59 | Admitting: Speech Pathology

## 2020-02-04 ENCOUNTER — Encounter: Payer: Self-pay | Admitting: Speech Pathology

## 2020-02-04 DIAGNOSIS — R471 Dysarthria and anarthria: Secondary | ICD-10-CM

## 2020-02-04 DIAGNOSIS — R41841 Cognitive communication deficit: Secondary | ICD-10-CM | POA: Diagnosis not present

## 2020-02-04 NOTE — Therapy (Signed)
[facility] [address] [place], [place], [address] Phone: [phone]   Fax:  [phone]  Speech Language Pathology Treatment  Patient Details  Name: Tara [name] MRN: [id number] Date of Birth: 1965-09-20 Referring Provider (SLP): Frann Rider NP   Encounter Date: 02/04/2020   End of Session - 02/04/20 1406    Visit Number 27    Number of Visits 31    Date for SLP Re-Evaluation 02/13/20    SLP Start Time 1330    SLP Stop Time  1401    SLP Time Calculation (min) 31 min           Past Medical History:  Diagnosis Date  . Asthma   . Hyperlipidemia   . Migraine   . Rotator cuff tear   . Spondylolisthesis    L4-5    Past Surgical History:  Procedure Laterality Date  . BUBBLE STUDY  07/30/2019   Procedure: BUBBLE STUDY;  Surgeon: Donato Heinz, MD;  Location: [facility];  Service: Cardiovascular;;  . LOOP RECORDER INSERTION N/A 07/30/2019   Procedure: LOOP RECORDER INSERTION;  Surgeon: Evans Lance, MD;  Location: [facility];  Service: Cardiovascular;  Laterality: N/A;  . No prior surgery    . TEE WITHOUT CARDIOVERSION N/A 07/30/2019   Procedure: TRANSESOPHAGEAL ECHOCARDIOGRAM (TEE);  Surgeon: Donato Heinz, MD;  Location: [facility] ENDOSCOPY;  Service: Cardiovascular;  Laterality: N/A;    There were no vitals filed for this visit.   Subjective Assessment - 02/04/20 1329    Subjective Pt arrives 15 minutes late    Currently in Pain? Yes    Pain Score 3     Pain Location Finger (Comment which one)    Pain Orientation Left    Pain Descriptors / Indicators Burning;Aching;Numbness    Pain Type Chronic pain                 ADULT SLP TREATMENT - 02/04/20 1331      General Information   Behavior/Cognition Alert;Cooperative      Treatment Provided   Treatment provided Cognitive-Linquistic      Cognitive-Linquistic Treatment   Treatment focused on  Cognition;Dysarthria;Patient/family/caregiver education    Skilled Treatment Pt arrives with hoarse voice frequent throat clear and volume average 64dB. Loud /a/ to recalibratin volume averaged 84dB with clear phonation. Strucutred speech tasks focusing on breath support and volume. When she speaks with volume and intent phonation is clear with average of 71dB with rare min A. Edwena Felty did hear her voice improve with volume breath support, as opposed to quiet weak voice with min A. Simple conversation targeting volume, averaged 70dB with rare min A and she maintained clear phonation.       Assessment / Recommendations / Plan   Plan Continue with current plan of care      Progression Toward Goals   Progression toward goals Progressing toward goals            SLP Education - 02/04/20 1404    Education Details HEP for voice, use breath support and speak with intent to clear hoarseness, eliminate throat clears    Person(s) Educated Patient    Methods Explanation;Demonstration;Verbal cues;Handout    Comprehension Verbalized understanding;Returned demonstration;Verbal cues required            SLP Short Term Goals - 02/04/20 1406      SLP SHORT TERM GOAL #1   Title Pt will utiltize external aids to schedule and complete daily  tasks with rare min A over 3 sessions    Baseline 10-28-19, 10-29-19    Status Partially Met      SLP SHORT TERM GOAL #2   Title Pt will uitlize compensatory strategies to attend to and process verbal instructions and converstion accurately with rare min A over 2 sessions    Baseline 10-28-19, 10-29-19,    Status Achieved      SLP SHORT TERM GOAL #3   Title Pt will read 1 paragraph (5-7 sentences) and recall details with rare min A over 2 sessions    Baseline 10-28-19    Status Achieved            SLP Long Term Goals - 02/04/20 1406      SLP LONG TERM GOAL #1   Title Pt will utilize compensations for attention to prepare a main dish for 3 meals with rare min A     Baseline 10-28-19, 10-29-19, 11-05-19    Status Achieved      SLP LONG TERM GOAL #2   Title Pt will carryover compensatory strategies for attention and processing to run 3 errands in a row successfully with rare min A    Baseline 11-07-19, 11-11-19    Status Achieved      SLP LONG TERM GOAL #3   Title Pt will return to ADL/IADL task after a distraction and succesfully complete task before starting a new activity with rare min A over 3 sessions    Baseline 11-05-19, 6-11-216-15-21    Status Achieved      SLP LONG TERM GOAL #4   Title pt will incr voice volume in 8 minutes simple conversation to low 70s in 2 sessions    Baseline 02/04/20;    Time 3   renewed 01-07-20 for 5 weeks   Period Weeks    Status On-going      SLP LONG TERM GOAL #5   Title pt will demo compensatory strategies or report using compensations at home for deficits in attention in or between 4 therapy sessions    Baseline 01-26-20; 01/28/20;    Time 3    Period Weeks    Status Achieved            Plan - 02/04/20 1405    Clinical Impression Statement Pt's voice strength continues as a major target of ST. She continues to report completing voice strength exercises regularly. See "skilled intervention" for more details. Ongoing training of compensations for cogntive impairments for successful completion of IADL's, intelligiblilty. Continue skilled ST    Speech Therapy Frequency 2x / week    Duration --   15 weeks or 31 visits   Treatment/Interventions Language facilitation;Environmental controls;Cueing hierarchy;SLP instruction and feedback;Compensatory strategies;Functional tasks;Cognitive reorganization;Patient/family education;Multimodal communcation approach;Internal/external aids;Compensatory techniques    Potential to Achieve Goals Good           Patient will benefit from skilled therapeutic intervention in order to improve the following deficits and impairments:   Dysarthria and anarthria  Cognitive communication  deficit    Problem List Patient Active Problem List   Diagnosis Date Noted  . CVA (cerebral vascular accident) ([place]) 07/29/2019  . Dysphagia 07/29/2019  . Left hand weakness 07/29/2019  . Palpitations 09/15/2015  . Diastolic dysfunction 09/81/1914  . Renal cyst, left 06/19/2013  . History of migraine headaches 03/05/2013  . Complicated migraine 78/29/5621  . HLD (hyperlipidemia) 03/05/2013  . 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 Rotator cuff syndrome 03/05/2013  . Low back pain 03/05/2013  Jasraj Lappe, Annye Rusk MS, CCC-SLP 02/04/2020, 2:07 PM  [facility] [address] [place], [place], [address] Phone: [phone]   Fax:  [phone]   Name: Yesennia Hirota MRN: [id number] Date of Birth: 1966/01/14

## 2020-02-04 NOTE — Patient Instructions (Addendum)
  Eliminate clearing your throat  It is important that you practice good breath support and good volume at home to reduce hoarseness  You larynx and vocal folds can get weak if you don't exercise good volume and breath support  If it helps you remember to use good volume and breathe , put your hand on your stomach  Have your husband cup his ear when you get low voice or hoarse voice  Effort of 6 or 7 out of 10 when you talk  Listen for the horse or frog. When you hear that, take a big breath and power your voice.

## 2020-02-09 ENCOUNTER — Ambulatory Visit: Payer: 59 | Admitting: Speech Pathology

## 2020-02-09 ENCOUNTER — Other Ambulatory Visit: Payer: Self-pay

## 2020-02-09 ENCOUNTER — Encounter: Payer: Self-pay | Admitting: Speech Pathology

## 2020-02-09 DIAGNOSIS — R471 Dysarthria and anarthria: Secondary | ICD-10-CM | POA: Diagnosis not present

## 2020-02-09 DIAGNOSIS — R41841 Cognitive communication deficit: Secondary | ICD-10-CM | POA: Diagnosis not present

## 2020-02-09 NOTE — Patient Instructions (Signed)
   Keep exercising your voice to keep it strong - loud [facility]!! And reading aloud focusing on volume and strong voice  When you hear the raspy voice, take a breath and power your voice  After you graduate therapies, be thinking about how you will spend your days to give you some structure and be out of the house a little   As the weather cools, consider walking in a park with benches to build endurance  Keep reducing your throat clearing - you are not even

## 2020-02-09 NOTE — Therapy (Signed)
[facility] [address] [place], [place], [address] Phone: [phone]   Fax:  [phone]  Speech Language Pathology Treatment  Patient Details  Name: Tara [name] MRN: [id number] Date of Birth: May 31, 1965 Referring Provider (SLP): Frann Rider NP   Encounter Date: 02/09/2020   End of Session - 02/09/20 1457    Visit Number 28    Number of Visits 31    Date for SLP Re-Evaluation 02/13/20    SLP Start Time 70    SLP Stop Time  1359    SLP Time Calculation (min) 41 min    Activity Tolerance Patient tolerated treatment well           Past Medical History:  Diagnosis Date  . Asthma   . Hyperlipidemia   . Migraine   . Rotator cuff tear   . Spondylolisthesis    L4-5    Past Surgical History:  Procedure Laterality Date  . BUBBLE STUDY  07/30/2019   Procedure: BUBBLE STUDY;  Surgeon: Donato Heinz, MD;  Location: [facility];  Service: Cardiovascular;;  . LOOP RECORDER INSERTION N/A 07/30/2019   Procedure: LOOP RECORDER INSERTION;  Surgeon: Evans Lance, MD;  Location: [facility];  Service: Cardiovascular;  Laterality: N/A;  . No prior surgery    . TEE WITHOUT CARDIOVERSION N/A 07/30/2019   Procedure: TRANSESOPHAGEAL ECHOCARDIOGRAM (TEE);  Surgeon: Donato Heinz, MD;  Location: [facility];  Service: Cardiovascular;  Laterality: N/A;    There were no vitals filed for this visit.   Subjective Assessment - 02/09/20 1327    Subjective "I still have to make the effort" re: speech    Currently in Pain? Yes    Pain Score 3     Pain Location Finger (Comment which one)    Pain Orientation Left    Pain Descriptors / Indicators Aching;Burning;Numbness    Pain Type Chronic pain    Pain Onset More than a month ago                 ADULT SLP TREATMENT - 02/09/20 1329      General Information   Behavior/Cognition Alert;Cooperative;Pleasant mood      Treatment  Provided   Treatment provided Cognitive-Linquistic      Cognitive-Linquistic Treatment   Treatment focused on Dysarthria;Patient/family/caregiver education    Skilled Treatment Pt enters room with clear phonation and average of 68dB with occasional min A.. Ongoing cue to reduce throat clearing. HEP for voice with rare min A average 80dB.[place] Kitchen Conversation volume averaged 68dB with rare min A.  Focus on carryover outside of the room next sessions      Assessment / Recommendations / [place] with current plan of care      Progression Toward Goals   Progression toward goals Progressing toward goals            SLP Education - 02/09/20 1455    Education Details HEP for voice; eliminate throat clears    Person(s) Educated Patient    Methods Explanation;Demonstration;Verbal cues;Handout    Comprehension Verbalized understanding;Returned demonstration            SLP Short Term Goals - 02/09/20 1456      SLP SHORT TERM GOAL #1   Title Pt will utiltize external aids to schedule and complete daily tasks with rare min A over 3 sessions    Baseline 10-28-19, 10-29-19    Status Partially Met  SLP SHORT TERM GOAL #2   Title Pt will uitlize compensatory strategies to attend to and process verbal instructions and converstion accurately with rare min A over 2 sessions    Baseline 10-28-19, 10-29-19,    Status Achieved      SLP SHORT TERM GOAL #3   Title Pt will read 1 paragraph (5-7 sentences) and recall details with rare min A over 2 sessions    Baseline 10-28-19    Status Achieved            SLP Long Term Goals - 02/09/20 1456      SLP LONG TERM GOAL #1   Title Pt will utilize compensations for attention to prepare a main dish for 3 meals with rare min A    Baseline 10-28-19, 10-29-19, 11-05-19    Status Achieved      SLP LONG TERM GOAL #2   Title Pt will carryover compensatory strategies for attention and processing to run 3 errands in a row successfully with rare min A     Baseline 11-07-19, 11-11-19    Status Achieved      SLP LONG TERM GOAL #3   Title Pt will return to ADL/IADL task after a distraction and succesfully complete task before starting a new activity with rare min A over 3 sessions    Baseline 11-05-19, 6-11-216-15-21    Status Achieved      SLP LONG TERM GOAL #4   Title pt will incr voice volume in 8 minutes simple conversation to low 70s in 2 sessions    Baseline 02/04/20; 02/09/20    Time 3   renewed 01-07-20 for 5 weeks   Period Weeks    Status On-going      SLP LONG TERM GOAL #5   Title pt will demo compensatory strategies or report using compensations at home for deficits in attention in or between 4 therapy sessions    Baseline 01-26-20; 01/28/20;    Time 3    Period Weeks    Status Achieved            Plan - 02/09/20 1456    Clinical Impression Statement Pt's voice strength continues as a major target of ST. She continues to report completing voice strength exercises regularly. See "skilled intervention" for more details. Ongoing training of compensations for cogntive impairments for successful completion of IADL's, intelligiblilty. Continue skilled ST    Speech Therapy Frequency 2x / week    Duration --   15 weeks or 31 visits   Treatment/Interventions Language facilitation;Environmental controls;Cueing hierarchy;SLP instruction and feedback;Compensatory strategies;Functional tasks;Cognitive reorganization;Patient/family education;Multimodal communcation approach;Internal/external aids;Compensatory techniques    Potential to Achieve Goals Good           Patient will benefit from skilled therapeutic intervention in order to improve the following deficits and impairments:   Dysarthria and anarthria    Problem List Patient Active Problem List   Diagnosis Date Noted  . CVA (cerebral vascular accident) ([facility]) 07/29/2019  . Dysphagia 07/29/2019  . Left hand weakness 07/29/2019  . Palpitations 09/15/2015  . Diastolic dysfunction  83/38/2505  . Renal cyst, left 06/19/2013  . History of migraine headaches 03/05/2013  . Complicated migraine 39/76/7341  . HLD (hyperlipidemia) 03/05/2013  . 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 Rotator cuff syndrome 03/05/2013  . Low back pain 03/05/2013    Thimothy Barretta, Annye Rusk MS, CCC-SLP 02/09/2020, 2:57 PM  [facility] [address] [place], [place], [address] Phone: [phone]  Fax:  [phone]   Name: Tara [name] MRN: [id number] Date of Birth: 11-24-1965

## 2020-02-11 ENCOUNTER — Ambulatory Visit: Payer: 59

## 2020-02-11 ENCOUNTER — Other Ambulatory Visit: Payer: Self-pay

## 2020-02-11 DIAGNOSIS — R471 Dysarthria and anarthria: Secondary | ICD-10-CM

## 2020-02-11 DIAGNOSIS — R41841 Cognitive communication deficit: Secondary | ICD-10-CM | POA: Diagnosis not present

## 2020-02-11 NOTE — Therapy (Signed)
[facility] [address] [place], [place], [address] Phone: [phone]   Fax:  [phone]  Speech Language Pathology Treatment/Discharge Summary  Patient Details  Name: Tara [name] MRN: [id number] Date of Birth: Dec 15, 1965 Referring Provider (SLP): Frann Rider NP   Encounter Date: 02/11/2020   End of Session - 02/11/20 1427    Visit Number 29    Number of Visits 31    Date for SLP Re-Evaluation 02/13/20    SLP Start Time 1157   10 minutes late   SLP Stop Time  1229    SLP Time Calculation (min) 32 min    Activity Tolerance Patient tolerated treatment well           Past Medical History:  Diagnosis Date  . Asthma   . Hyperlipidemia   . Migraine   . Rotator cuff tear   . Spondylolisthesis    L4-5    Past Surgical History:  Procedure Laterality Date  . BUBBLE STUDY  07/30/2019   Procedure: BUBBLE STUDY;  Surgeon: Donato Heinz, MD;  Location: [facility];  Service: Cardiovascular;;  . LOOP RECORDER INSERTION N/A 07/30/2019   Procedure: LOOP RECORDER INSERTION;  Surgeon: Evans Lance, MD;  Location: [facility];  Service: Cardiovascular;  Laterality: N/A;  . No prior surgery    . TEE WITHOUT CARDIOVERSION N/A 07/30/2019   Procedure: TRANSESOPHAGEAL ECHOCARDIOGRAM (TEE);  Surgeon: Donato Heinz, MD;  Location: [facility]OSCOPY;  Service: Cardiovascular;  Laterality: N/A;    There were no vitals filed for this visit.   Subjective Assessment - 02/11/20 1159    Subjective Pt arrived 10 minutes late. "I have to have (louder talking) in my mind to give myself a reminder to talk louder."    Currently in Pain? Yes    Pain Score 2     Pain Location Finger (Comment which one)    Pain Orientation Left    Pain Descriptors / Indicators Aching;Burning;Numbness    Pain Type Chronic pain                 ADULT SLP TREATMENT - 02/11/20 1201      General Information    Behavior/Cognition Alert;Cooperative;Pleasant mood      Treatment Provided   Treatment provided Cognitive-Linquistic      Cognitive-Linquistic Treatment   Treatment focused on Dysarthria;Patient/family/caregiver education    Skilled Treatment Pt enters room today clearing her throat - SLP reminded pt to sip water instead and provided her with cup of water. Initially, in a short 3 minute conversation she produced clear phonation and average upper 60s dB with rare min A for volume. Loud /a/ copmleted with rare min A for volume, average low 80s dB. Volume in 10 minutes and 11 minutes simple to mod complex conversation averaged upper 60s dB with rare min A. SLP took pt outdoors to verify pt could maintain loudness outside of therapy room - pt maintained 100% intelligible speech for 10 minutes conversation. SLP encouraged pt to cont to practice each day with louder speech. Pt asked SLP to include something about SLP opinion about pt return to work. From a speech perspective, pt appears WFL to return to work. If communicating with people with hearing loss, she will have to use a louder voice to make communication more effective. From a cognitive standpoint, it may be best to have pt undergo cognitive assessment with neuropsychologist to make a determination about ability to return to work.  Assessment / Recommendations / Plan   Plan Discharge SLP treatment due to (comment)   met goals     Progression Toward Goals   Progression toward goals Goals met, education completed, patient discharged from [facility] - 02/09/20 [place] #1   Title Pt will utiltize external aids to schedule and complete daily tasks with rare min A over 3 sessions    Baseline 10-28-19, 10-29-19    Status Partially Met      SLP SHORT TERM GOAL #2   Title Pt will uitlize compensatory strategies to attend to and process verbal instructions and converstion accurately with rare  min A over 2 sessions    Baseline 10-28-19, 10-29-19,    Status Achieved      SLP SHORT TERM GOAL #3   Title Pt will read 1 paragraph (5-7 sentences) and recall details with rare min A over 2 sessions    Baseline 10-28-19    Status Achieved            SLP Long Term Goals - 02/09/20 1456      SLP LONG TERM GOAL #1   Title Pt will utilize compensations for attention to prepare a main dish for 3 meals with rare min A    Baseline 10-28-19, 10-29-19, 11-05-19    Status Achieved      SLP LONG TERM GOAL #2   Title Pt will carryover compensatory strategies for attention and processing to run 3 errands in a row successfully with rare min A    Baseline 11-07-19, 11-11-19    Status Achieved      SLP LONG TERM GOAL #3   Title Pt will return to ADL/IADL task after a distraction and succesfully complete task before starting a new activity with rare min A over 3 sessions    Baseline 11-05-19, 6-11-216-15-21    Status Achieved      SLP LONG TERM GOAL #4   Title pt will incr voice volume in 8 minutes simple conversation to low 70s in 2 sessions    Baseline 02/04/20; 02/09/20    Time 3   renewed 01-07-20 for 5 weeks   Period Weeks    Status On-going      SLP LONG TERM GOAL #5   Title pt will demo compensatory strategies or report using compensations at home for deficits in attention in or between 4 therapy sessions    Baseline 01-26-20; 01/28/20;    Time 3    Period Weeks    Status Achieved            Plan - 02/11/20 1428    Clinical Impression Statement Pt's voice strength continues as a major target of ST. She continues to report completing voice strength exercises regularly. In two 10 minute conversations with simple linguistic demand she maintained upper 60s dB. [place] agrees with d/c today. Pt asked SLP to comment on her ability to return to work: From a speaking standpoint she would be successful on the phone communicating with someone with WNL hearing. With individuals with hearing loss, she may  seem a bit soft if she is not focusing on louder speech. Cognitively, SLP recommends pt undergo neuropsychological assessment to ascertain pt's ability to perform her work duties. See "skilled intervention" for more details. Pt has met all goals and agrees with d/c at this time.    Speech  Therapy Frequency 2x / week    Duration --   15 weeks or 31 visits   Treatment/Interventions Language facilitation;Environmental controls;Cueing hierarchy;SLP instruction and feedback;Compensatory strategies;Functional tasks;Cognitive reorganization;Patient/family education;Multimodal communcation approach;Internal/external aids;Compensatory techniques    Potential to Achieve Goals Good           Patient will benefit from skilled therapeutic intervention in order to improve the following deficits and impairments:   Dysarthria and anarthria  Cognitive communication deficit   SPEECH THERAPY DISCHARGE SUMMARY  Visits from Start of Care: 29  Current functional level related to goals / functional outcomes: See goals above. Pt met all LTGs. She speaks at a volume averaging in upper 60s dB (WNL volume is in the low 70s dB). Outdoors today, [place] was able to maintain 100% intelligibility with consistent mod traffic noise.   Remaining deficits: Mild dysarthria - accentuated with people with some degree of hearing loss. ? Presence of mild cognitive linguistic deficits. 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   Education / Equipment: Attention compensations, compensations to reduce visual distraction and auditory distractions, compensations for using WNL speech loudness, HEP for speech loudness.   Plan: Patient agrees to discharge.  Patient goals were met. Patient is being discharged due to meeting the stated rehab goals.  ?????       Problem List Patient Active Problem List   Diagnosis Date Noted  . CVA (cerebral vascular accident) ([facility])  07/29/2019  . Dysphagia 07/29/2019  . Left hand weakness 07/29/2019  . Palpitations 09/15/2015  . Diastolic dysfunction 26/83/4196  . Renal cyst, left 06/19/2013  . History of migraine headaches 03/05/2013  . Complicated migraine 22/29/7989  . HLD (hyperlipidemia) 03/05/2013  . 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 Rotator cuff syndrome 03/05/2013  . Low back pain 03/05/2013    [facility] ,MS, CCC-SLP  02/11/2020, 2:32 PM  [facility] [address] [place], [place], [address] Phone: [phone]   Fax:  [phone]   Name: Tara [name] MRN: [id number] Date of Birth: [birth year]

## 2020-02-22 LAB — CUP PACEART REMOTE DEVICE CHECK
Date Time Interrogation Session: 20210922230538
Implantable Pulse Generator Implant Date: 20210303

## 2020-02-23 ENCOUNTER — Ambulatory Visit (INDEPENDENT_AMBULATORY_CARE_PROVIDER_SITE_OTHER): Payer: 59 | Admitting: Emergency Medicine

## 2020-02-23 DIAGNOSIS — I639 Cerebral infarction, unspecified: Secondary | ICD-10-CM | POA: Diagnosis not present

## 2020-02-24 NOTE — Progress Notes (Signed)
[facility] Summary Report / Loop Recorder 

## 2020-03-01 ENCOUNTER — Ambulatory Visit (INDEPENDENT_AMBULATORY_CARE_PROVIDER_SITE_OTHER): Payer: 59 | Admitting: Adult Health

## 2020-03-01 ENCOUNTER — Encounter: Payer: Self-pay | Admitting: Adult Health

## 2020-03-01 ENCOUNTER — Telehealth: Payer: Self-pay | Admitting: Adult Health

## 2020-03-01 VITALS — BP 119/84 | HR 79 | Temp 99.3°F | Ht 64.0 in | Wt 160.0 lb

## 2020-03-01 DIAGNOSIS — I69398 Other sequelae of cerebral infarction: Secondary | ICD-10-CM

## 2020-03-01 DIAGNOSIS — F0630 Mood disorder due to known physiological condition, unspecified: Secondary | ICD-10-CM | POA: Diagnosis not present

## 2020-03-01 DIAGNOSIS — I639 Cerebral infarction, unspecified: Secondary | ICD-10-CM | POA: Diagnosis not present

## 2020-03-01 DIAGNOSIS — G4719 Other hypersomnia: Secondary | ICD-10-CM

## 2020-03-01 DIAGNOSIS — R202 Paresthesia of skin: Secondary | ICD-10-CM | POA: Diagnosis not present

## 2020-03-01 DIAGNOSIS — Q2112 Patent foramen ovale: Secondary | ICD-10-CM

## 2020-03-01 DIAGNOSIS — I69319 Unspecified symptoms and signs involving cognitive functions following cerebral infarction: Secondary | ICD-10-CM

## 2020-03-01 DIAGNOSIS — Q211 Atrial septal defect: Secondary | ICD-10-CM

## 2020-03-01 NOTE — Progress Notes (Signed)
Tara [name] is a 54 y.o. female here for a f/u on a stroke. Pt said he is slow with response and mobility. Pt still having congnative problems and is mentally and physically tired. She also has to take frequent breads when completing tasks. Pt also complains of numbness on the left expremity

## 2020-03-01 NOTE — Telephone Encounter (Signed)
Spoke to pt.  She is asking about recommendations going back to work.  I read her what was noted in visit notes.  She wanted Dr. Modesto Charon to be made aware so will fax note to him.  Faxed to Dr. Modesto Charon at [phone]. Confirmed fax received.

## 2020-03-01 NOTE — Telephone Encounter (Signed)
Pt states she has additional questions from visit today that she just remembered. Please advise at [phone].

## 2020-03-01 NOTE — Patient Instructions (Addendum)
Initial evaluation with Dr. 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  Referral placed to sleep clinic to rule out possible sleep apnea - you will be called to schedule initial evaluation  Will do EMG/NCV to further evaluate left arm symptoms - possible post stroke pain but will rule out nerve issues   Continue aspirin 81 mg daily  and Crestor for secondary stroke prevention  Your loop recorder will continue to be monitored for possible atrial fibrillation  Continue to follow up with PCP regarding cholesterol and blood pressure management  Maintain strict control of hypertension with blood pressure goal below 130/90 and cholesterol with LDL cholesterol (bad cholesterol) goal below 70 mg/dL.       Followup in the future with me in 4 months or call earlier if needed       Thank you for coming to see [place] at [facility]. I hope we have been able to provide you high quality care today.  You may receive a patient satisfaction survey over the next few weeks. We would appreciate your feedback and comments so that we may continue to improve ourselves and the health of our patients.    Sleep Apnea Sleep apnea is a condition in which breathing pauses or becomes shallow during sleep. Episodes of sleep apnea usually last 10 seconds or longer, and they may occur as many as 20 times an hour. Sleep apnea disrupts your sleep and keeps your body from getting the rest that it needs. This condition can increase your risk of certain health problems, including:  Heart attack.  Stroke.  Obesity.  Diabetes.  Heart failure.  Irregular heartbeat. What are the causes? There are three kinds of sleep apnea:  Obstructive sleep apnea. This kind is caused by a blocked or collapsed airway.  Central sleep apnea. This kind happens when the part of the brain that controls breathing does not send the correct signals to the muscles  that control breathing.  Mixed sleep apnea. This is a combination of obstructive and central sleep apnea. The most common cause of this condition is a collapsed or blocked airway. An airway can collapse or become blocked if:  Your throat muscles are abnormally relaxed.  Your tongue and tonsils are larger than normal.  You are overweight.  Your airway is smaller than normal. What increases the risk? You are more likely to develop this condition if you:  Are overweight.  Smoke.  Have a smaller than normal airway.  Are elderly.  Are female.  Drink alcohol.  Take sedatives or tranquilizers.  Have a family history of sleep apnea. What are the signs or symptoms? Symptoms of this condition include:  Trouble staying asleep.  Daytime sleepiness and tiredness.  Irritability.  Loud snoring.  Morning headaches.  Trouble concentrating.  Forgetfulness.  Decreased interest in sex.  Unexplained sleepiness.  Mood swings.  Personality changes.  Feelings of depression.  Waking up often during the night to urinate.  Dry mouth.  Sore throat. How is this diagnosed? This condition may be diagnosed with:  A medical history.  A physical exam.  A series of tests that are done while you are sleeping (sleep study). These tests are usually done in a sleep lab, but they may also be done at home. How is this treated? Treatment for this condition aims to restore normal breathing and to ease symptoms during sleep. It may involve managing health issues that can affect breathing, such as high blood pressure or  obesity. Treatment may include:  Sleeping on your side.  Using a decongestant if you have nasal congestion.  Avoiding the use of depressants, including alcohol, sedatives, and narcotics.  Losing weight if you are overweight.  Making changes to your diet.  Quitting smoking.  Using a device to open your airway while you sleep, such as: ? An oral appliance. This is  a custom-made mouthpiece that shifts your lower jaw forward. ? A continuous positive airway pressure (CPAP) device. This device blows air through a mask when you breathe out (exhale). ? A nasal expiratory positive airway pressure (EPAP) device. This device has valves that you put into each nostril. ? A bi-level positive airway pressure (BPAP) device. This device blows air through a mask when you breathe in (inhale) and breathe out (exhale).  Having surgery if other treatments do not work. During surgery, excess tissue is removed to create a wider airway. It is important to get treatment for sleep apnea. Without treatment, this condition can lead to:  High blood pressure.  Coronary artery disease.  In men, an inability to achieve or maintain an erection (impotence).  Reduced thinking abilities. Follow these instructions at home: Lifestyle  Make any lifestyle changes that your health care provider recommends.  Eat a healthy, well-balanced diet.  Take steps to lose weight if you are overweight.  Avoid using depressants, including alcohol, sedatives, and narcotics.  Do not use any products that contain nicotine or tobacco, such as cigarettes, e-cigarettes, and chewing tobacco. If you need help quitting, ask your health care provider. General instructions  Take over-the-counter and prescription medicines only as told by your health care provider.  If you were given a device to open your airway while you sleep, use it only as told by your health care provider.  If you are having surgery, make sure to tell your health care provider you have sleep apnea. You may need to bring your device with you.  Keep all follow-up visits as told by your health care provider. This is important. Contact a health care provider if:  The device that you received to open your airway during sleep is uncomfortable or does not seem to be working.  Your symptoms do not improve.  Your symptoms get  worse. Get help right away if:  You develop: ? Chest pain. ? Shortness of breath. ? Discomfort in your back, arms, or stomach.  You have: ? Trouble speaking. ? Weakness on one side of your body. ? Drooping in your face. These symptoms may represent a serious problem that is an emergency. Do not wait to see if the symptoms will go away. Get medical help right away. Call your local emergency services (911 in the [place] Do not drive yourself to the hospital. Summary  Sleep apnea is a condition in which breathing pauses or becomes shallow during sleep.  The most common cause is a collapsed or blocked airway.  The goal of treatment is to restore normal breathing and to ease symptoms during sleep. This information is not intended to replace advice given to you by your health care provider. Make sure you discuss any questions you have with your health care provider. Document Revised: 10/30/2018 Document Reviewed: 01/08/2018 Elsevier Patient Education  2020 [employer]     Electromyoneurogram Electromyoneurogram is a test to check how well your muscles and nerves are working. This procedure includes the combined use of electromyogram (EMG) and nerve conduction study (NCS). EMG is used to look for muscular disorders. NCS,  which is also called electroneurogram, measures how well your nerves are controlling your muscles. The procedures are usually done together to check if your muscles and nerves are healthy. If the results of the tests are abnormal, this may indicate disease or injury, such as a neuromuscular disease or peripheral nerve damage. Tell a health care provider about:  Any allergies you have.  All medicines you are taking, including vitamins, herbs, eye drops, creams, and over-the-counter medicines.  Any problems you or family members have had with anesthetic medicines.  Any blood disorders you have.  Any surgeries you have had.  Any medical conditions you have.  If  you have a pacemaker.  Whether you are pregnant or may be pregnant. What are the risks? Generally, this is a safe procedure. However, problems may occur, including:  Infection where the electrodes were inserted.  Bleeding. What happens before the procedure? Medicines Ask your health care provider about:  Changing or stopping your regular medicines. This is especially important if you are taking diabetes medicines or blood thinners.  Taking medicines such as aspirin and ibuprofen. These medicines can thin your blood. Do not take these medicines unless your health care provider tells you to take them.  Taking over-the-counter medicines, vitamins, herbs, and supplements. General instructions  Your health care provider may ask you to avoid: ? Beverages that have caffeine, such as coffee and tea. ? Any products that contain nicotine or tobacco. These products include cigarettes, e-cigarettes, and chewing tobacco. If you need help quitting, ask your health care provider.  Do not use lotions or creams on the same day that you will be having the procedure. What happens during the procedure? For EMG   Your health care provider will ask you to stay in a position so that he or she can access the muscle that will be studied. You may be standing, sitting, or lying down.  You may be given a medicine that numbs the area (local anesthetic).  A very thin needle that has an electrode will be inserted into your muscle.  Another small electrode will be placed on your skin near the muscle.  Your health care provider will ask you to continue to remain still.  The electrodes will send a signal that tells about the electrical activity of your muscles. You may see this on a monitor or hear it in the room.  After your muscles have been studied at rest, your health care provider will ask you to contract or flex your muscles. The electrodes will send a signal that tells about the electrical activity of  your muscles.  Your health care provider will remove the electrodes and the electrode needles when the procedure is finished. The procedure may vary among health care providers and hospitals. For NCS   An electrode that records your nerve activity (recording electrode) will be placed on your skin by the muscle that is being studied.  An electrode that is used as a reference (reference electrode) will be placed near the recording electrode.  A paste or gel will be applied to your skin between the recording electrode and the reference electrode.  Your nerve will be stimulated with a mild shock. Your health care provider will measure how much time it takes for your muscle to react.  Your health care provider will remove the electrodes and the gel when the procedure is finished. The procedure may vary among health care providers and hospitals. What happens after the procedure?  It is up to  you to get the results of your procedure. Ask your health care provider, or the department that is doing the procedure, when your results will be ready.  Your health care provider may: ? Give you medicines for any pain. ? Monitor the insertion sites to make sure that bleeding stops. Summary  Electromyoneurogram is a test to check how well your muscles and nerves are working.  If the results of the tests are abnormal, this may indicate disease or injury.  This is a safe procedure. However, problems may occur, such as bleeding and infection.  Your health care provider will do two tests to complete this procedure. One checks your muscles (EMG) and another checks your nerves (NCS).  It is up to you to get the results of your procedure. Ask your health care provider, or the department that is doing the procedure, when your results will be ready. This information is not intended to replace advice given to you by your health care provider. Make sure you discuss any questions you have with your health care  provider. Document Revised: 01/29/2018 Document Reviewed: 01/11/2018 Elsevier Patient Education  2020 [employer].

## 2020-03-01 NOTE — Progress Notes (Signed)
[facility] [address] [place]. [place] [address] [phone]       STROKE FOLLOW UP NOTE  [name] Date of Birth:  1965/06/23 Medical Record Number:  [id number]   Reason for Referral: stroke follow up    CHIEF COMPLAINT:  Chief Complaint  Patient presents with  . Follow-up    rm 9  . Cerebrovascular Accident    Pt said she she is slow with respoce and mobility. Pt still having congnative problems and is mentally tires.    HPI:   Today, 03/01/2020, Tara [name] returns for 77-month stroke follow-up unaccompanied.  Residual deficits of cognitive impairment, dysarthria, imbalance and left hand weakness and numbness as well as continued excessive fatigue.  She recently completed speech therapy as she met stated rehab goals but due to continued cognitive impairment and poststroke anxiety/depression, she has initial evaluation with Dr. Sima [name] 10/13.  Cognition impairment with visual difficulty with high-level functioning, short-term recall, attention, processing and visual-spatial skills.  She also feels overwhelmed in crowded areas such as grocery store or needing to complete a more complex task.  She does report improvement of dizziness/imbalance with less frequency as before - will continue to feel with quick head movements and bending over.  Left hand paresthesias intermittent typically occurring with increased use of left hand or the middle of the night with burning sensation, numbness/tingling and "achy" feeling.  Symptoms present 3rd and 4th digit of left hand radiating in the middle of her wrist and stopping 1-2" below elbow.  Excessive daytime fatigue persists despite sleeping well through the night.  Does report occasional snoring.  She remains on long term disability and in the process of completing social security disability.  Remains on aspirin and Crestor for secondary stroke prevention without side effects.  Blood pressure today 119/84.  Loop  recorder has not shown atrial fibrillation thus far.  No further concerns at this time.     History provided for reference purposes only Update 11/18/2019 JM: Tara [name] returns for follow-up regarding cryptogenic right parietal stroke in 07/2019.  Residual deficits of mild cognitive impairment, left hand weakness, fatigue with decreased activity tolerance and dizziness/imbalance.  Continues to participate in speech therapy for impairments of attention, processing and visual-spatial skills as well as dysarthria with reported ongoing improvement.  Continues to work with PT for gait training, environmental scanning, and balance and dizziness progressing towards goals.  Continues to work with OT for improvement of LUE fine motor control and coordination and environmental standing/dynamic balance.  She does report ongoing difficulty concentrating especially when having multiple tasks to complete.  Also reports difficulty going into crowded places such as stores as she feels visually overstimulated and starts to feel anxious.  She denies anxiety or depression but based on PHQ-9 of 16 likely underlying depression.  Becomes tearful during visit as she is frustrated with ongoing deficits and feels as though she should have recovered more than what she has.  Reports left hand numbness/tingling located left middle and ring finger with radiation to elbow can wake her up in the middle of the night or become present with increased hand use. 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  Remains on aspirin and Crestor for secondary stroke prevention. Recent lipid panel by PCP showed LDL 53.  Blood pressure today 113/78.  Loop recorder is not shown atrial fibrillation thus far.  No further concerns at this time.  Update 09/29/2019 Dr. Leonie [name] ; patient is seen upon request that she call the office requesting medical leave be prolonged as she is having cognitive  difficulties and fatigue and dizziness.  She states that she has been getting outpatient physical occupational therapy and but complains of a transient positional dizziness when she looks down or turns her neck quickly.  She is been getting some vestibular therapy which has been helping.  She was recently referred to speech therapy for mental status slowness and progressing with appointment has not happened yet.  She feels overall there is slow and steady progress but she is not ready to return to work yet.  She wants to be out of work till her next assessment with her primary physician Dr. Jacelyn [name] which is scheduled for June 6.  She has not had any recurrent stroke or TIA symptoms.  She does complain of some intermittent numbness and tightness in her middle 2 fingers in the left hand particularly when she is tired.  She is tolerating aspirin well without bruising or bleeding.  Blood pressures well controlled today it is 116/77.  She had to reduce the dose of Crestor to 20 mg that she had some muscle aches and pains which appear not to have improved.  She does take coenzyme Q 10 as well.  She denies any headache, slurred speech, increasing gait or balance problems.  Initial visit 08/13/2019 JM: Tara [name] is a 54 year old female who is being seen today for hospital follow-up.  Residual stroke deficits of mild left-sided weakness, dizziness with quick head movements and unsteadiness on feet. She does endorse some improvement but recently just started therapies. She does endorse increased fatigue since discharge and feels like her mind is slower/delayed.  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  She initially had FMLA which expired on 3/15 and she is requesting for extension.  She is eager to continue with therapies for ongoing improvement.  She continues on DAPT but will be completing 3-week Plavix course in the near future.  Denies bleeding or bruising.  She continues on  Crestor 40 mg daily but does endorse myalgias bilateral upper extremities and torso.  Blood pressure today 118/88.  Loop recorder has not shown atrial fibrillation thus far.  No further concerns at this time.  Stroke admission 07/29/2019: Tara [name] is a 54 y.o. female with history of HLD, migraine and asthma who presented with HA since 2/28 who woke w/ LUE numbness and weakness, severe dysarthria and dysphagia on 07/29/2019.  Evaluated by stroke team and Dr. Leonie [name] with stroke work-up revealing 2 tiny right parietal infarcts embolic secondary to unknown source.  In addition to acute infarcts, MRI also showed several small chronic infarcts in the cerebellum but per review, Dr Tara [name] these were likely flow-voids and not old strokes.  TEE did not show evidence of PFO.  Recommended further evaluation with TCD which was suggestive of medium to large PFO.  Recommended further discussion outpatient regarding possible PFO closure.  Loop recorder placed to rule out atrial fibrillation as possible cause of stroke.  Hypercoagulable work-up negative.  Recommended DAPT for 3 weeks and aspirin alone.  LDL 151 and recommended increasing Crestor frequency to 40 mg daily.  No history of HTN or DM.  Other stroke risk factors include migraines but no prior history of stroke.  Discharged home in stable condition without therapy needs.  Stroke:   2 tiny R parietal  infarcts  embolic secondary to unknown source.Marland Kitchen PFO unclear if related to her stroke ROPE score 6 ) 62% chance pfo is related to stroke )  Code Stroke CT head hyperdense R MCA bifurcation. No acute abnormality. R maxillary sinus dz. ASPECTS 10.     CTA head & neck no LVO. Mild distal SMALL VESSEL DISEASE anterior and posterior circulation. Neck ok   CT perfusion Unremarkable   MRI  2 tiny acute cortical R brain (posterior middle frontal gyrus and posterior R parietal lobe). Read as old cerebellar infarcts but  Dr. Leonie [name] feels they are flow voids and not old  stroke. Mild paranasal sinus dz.   2D Echo normal   TEE  PFO present  EEG normal   LE dopplers no DVT   LDL 151 mg%  HgbA1c 5.3  UDS not done   TSH normal    ROS:   14 system review of systems performed and negative with exception of those listed in HPI  Depression screen [facility] 2/9 03/01/2020 11/18/2019 03/05/2013  Decreased Interest _0 Down, Depressed, Hopeless 1 2 0  PHQ - 2 Score _1 Altered sleeping 1 2 -  Tired, decreased energy 2 3 -  Change in appetite 0 1 -  Feeling bad or failure about yourself  1 2 -  Trouble concentrating 2 3 -  Moving slowly or fidgety/restless 2 2 -  Suicidal thoughts 0 0 -  PHQ-9 Score 10 16 -  Difficult doing work/chores Somewhat difficult Very difficult -   GAD 7 : Generalized Anxiety Score 03/01/2020 11/18/2019  Nervous, Anxious, on Edge 1 1  Control/stop worrying 1 1  Worry too much - different things 1 1  Trouble relaxing 1 1  Restless 2 1  Easily annoyed or irritable 1 1  Afraid - awful might happen 1 1  Total GAD 7 Score 8 7  Anxiety Difficulty Somewhat difficult -        PMH:  Past Medical History:  Diagnosis Date  . Asthma   . Hyperlipidemia   . Migraine   . Rotator cuff tear   . Spondylolisthesis    L4-5    PSH:  Past Surgical History:  Procedure Laterality Date  . BUBBLE STUDY  07/30/2019   Procedure: BUBBLE STUDY;  Surgeon: Donato Heinz, MD;  Location: [place];  Service: Cardiovascular;;  . LOOP RECORDER INSERTION N/A 07/30/2019   Procedure: LOOP RECORDER INSERTION;  Surgeon: Evans Lance, MD;  Location: [place] [facility];  Service: Cardiovascular;  Laterality: N/A;  . No prior surgery    . TEE WITHOUT CARDIOVERSION N/A 07/30/2019   Procedure: TRANSESOPHAGEAL ECHOCARDIOGRAM (TEE);  Surgeon: Donato Heinz, MD;  Location: [facility] ENDOSCOPY;  Service: Cardiovascular;  Laterality: N/A;    Social History:  Social History   Socioeconomic History  . Marital status: Married    Spouse  name: Not on file  . Number of children: 2  . Years of education: BSN  . Highest education level: Not on file  Occupational History  . Occupation: [employer]  Tobacco Use  . Smoking status: Never Smoker  . Smokeless tobacco: Never Used  Substance and Sexual Activity  . Alcohol use: No    Alcohol/week: 0.0 standard drinks    Comment: Rare  . Drug use: No  . Sexual activity: Yes    Birth control/protection: None  Other Topics Concern  . Not on file  Social History Narrative   Lives at home w/ her  husband and son   Right-handed   Caffeine: seldom, soda on the weekend   Social Determinants of Health   Financial Resource Strain:   . Difficulty of Paying Living Expenses: Not on file  Food Insecurity:   . Worried About Charity fundraiser in the Last Year: Not on file  . Ran Out of Food in the Last Year: Not on file  Transportation Needs:   . Lack of Transportation (Medical): Not on file  . Lack of Transportation (Non-Medical): Not on file  Physical Activity:   . Days of Exercise per Week: Not on file  . Minutes of Exercise per Session: Not on file  Stress:   . Feeling of Stress : Not on file  Social Connections:   . Frequency of Communication with Friends and Family: Not on file  . Frequency of Social Gatherings with Friends and Family: Not on file  . Attends Religious Services: Not on file  . Active Member of Clubs or Organizations: Not on file  . Attends Archivist Meetings: Not on file  . Marital Status: Not on file  Intimate Partner Violence:   . Fear of Current or Ex-Partner: Not on file  . Emotionally Abused: Not on file  . Physically Abused: Not on file  . Sexually Abused: Not on file    Family History:  Family History  Problem Relation Age of Onset  . Heart disease Father 59       MI  . Hypertension Maternal Grandmother   . Hyperlipidemia Brother     Medications:   Current Outpatient Medications on File Prior to Visit  Medication Sig  Dispense Refill  . aspirin EC 81 MG EC tablet Take 1 tablet (81 mg total) by mouth daily.    . cholecalciferol (VITAMIN D) 1000 UNITS tablet Take 1,000 Units by mouth daily.    . Coenzyme Q10 (COQ10 PO) Take by mouth.    . Omega-3 Fatty Acids (FISH OIL) 500 MG CAPS Natures made Fish oil pearls 53m BID (Patient taking differently: Take 1 capsule by mouth daily. Natures made Fish oil pearls 5025mBID) 180 capsule 1  . Probiotic Product (PROBIOTIC PO) Take 1 capsule by mouth daily.     . rizatriptan (MAXALT) 10 MG tablet Take 10 mg by mouth as needed for migraine (headache).     . rosuvastatin (CRESTOR) 20 MG tablet Take 1 tablet (20 mg total) by mouth daily. 90 tablet 3   No current facility-administered medications on file prior to visit.    Allergies:   Allergies  Allergen Reactions  . Tape Rash     Physical Exam  Vitals:   03/01/20 0943  BP: 119/84  Pulse: 79  Temp: 99.3 F (37.4 C)  Weight: 160 lb (72.6 kg)  Height: _0  (1.626 m)   Body mass index is 27.46 kg/m. No exam data present  General: well developed, well nourished, pleasant middle-age female, seated, in no evident distress Head: head normocephalic and atraumatic.   Neck: supple with no carotid or supraclavicular bruits Cardiovascular: regular rate and rhythm, no murmurs Musculoskeletal: no deformity Skin:  no rash/petichiae Vascular:  Normal pulses all extremities   Neurologic Exam Mental Status: Awake and fully alert.   Occasional decreased volume with occasional speech hesitancy.  Oriented to place and time. Recent memory impaired and remote memory intact. Attention span, concentration and fund of knowledge appropriate during visit. Mood and affect appropriate.   MMSE - Mini Mental State Exam 03/01/2020  Orientation  to time 5  Orientation to Place 5  Registration 3  Attention/ Calculation 5  Recall 2  Language- name 2 objects 2  Language- repeat 1  Language- follow 3 step command 3  Language- read  & follow direction 1  Write a sentence 1  Copy design 1  Total score 29   Cranial Nerves:  Pupils equal, briskly reactive to light. Extraocular movements full without nystagmus. Visual fields full to confrontation. Hearing intact. Decreased sensory left lower face. Left nasolabial fold flatting. tongue, palate moves normally and symmetrically.  Motor: Normal bulk and tone. Normal strength in all tested extremity muscles except mild diminished fine finger movements on the left  Sensory.: slight decreased light touch, pinprick and vibratory sensation LUE and LLE compared to right side Coordination: Rapid alternating movements normal in all extremities except decreased left hand. Finger-to-nose and heel-to-shin performed accurately bilaterally. Gait and Station: Arises from chair without difficulty. Stance is normal. Gait demonstrates normal stride length with imbalance and use of cane Reflexes: 1+ and symmetric. Toes downgoing.       ASSESSMENT/PLAN: Tara [name] is a 54 y.o. year old female presented with LUE numbness and weakness along with severe dysarthria and dysphagia on 07/29/2019 with stroke work-up revealing 2 tiny right parietal infarcts embolic secondary to unknown source.  Loop recorder placed which has not shown atrial fibrillation thus far.  Also evidence of PFO with unclear relation to stroke.  Vascular risk factors include HLD and migraines.  She has of small PFO for which she wants to wait on elective endovascular closure.  She was evaluated for possible participation in [place] trial unfortunately did not meet criteria    R parietal strokes, cryptogenic -Residual deficits: Cognitive impairment, dizziness/imbalance, decreased left hand dexterity with paresthesias, excessive fatigue and post stroke anxiety/depression.  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  Currently on  long-term disability through PCP and in the process of applying for Social Security disability.  May possibly benefit from vocational rehab.  This can be further discussed at follow-up visit and after evaluation with Dr. Sima [name]. -Scheduled evaluation with Dr. Sima [name] 10/13 -Loop recorder has not shown atrial fibrillation thus far -continue monthly monitoring by cardiology -Continue aspirin and Crestor 20 mg daily for stroke prevention -maintain aggressive risk factor modification with strict control of hypertension blood pressure goal below 130/90 and lipids with LDL cholesterol goal below 70 mg percent  PFO -She continues to hold off on closure procedure due to COVID-19 concerns -She is aware to reach out to cardiology when she is interested in proceeding  Depression/anxiety, post stroke Adjustment disorder, poststroke -Scheduled initial evaluation with Dr. Sima [name] 10/13 -GAD-7 score 8; PHQ-9 10 -She continues to decline interest in medication management and is looking forward to evaluation with Dr. Sima [name]  Daytime fatigue -Likely multifactorial including post stroke fatigue, underlying depression/anxiety and residual stroke deficits -Referral placed to [place] [facility] to further assess for possible underlying sleep apnea due to prior stroke, occasional snoring and history of migraines  Left hand paresthesias -Likely post stroke as symptom distribution not clearly consistent with specific nerve root/entrapment but as symptoms worsen while sleeping or with increased use, recommend EMG/NCV to rule out possible underlying causes -Discussed use of compression glove or wrist splint for possible benefit      Follow-up in 3 months or call earlier if needed   I spent 35 minutes of face-to-face and non-face-to-face  time with patient.  This included previsit chart review, lab review, study review, order entry, electronic health record documentation, patient education regarding  cryptogenic stroke, long discussion regarding residual deficits and poststroke depression, importance of managing stroke risk factors and answered all questions to patient satisfaction   Frann Rider, [facility]  [facility] [address] [place], [place] [address]  Phone [phone] Fax [phone] Note: This document was prepared with digital dictation and possible smart phrase technology. Any transcriptional errors that result from this process are unintentional.

## 2020-03-01 NOTE — Progress Notes (Signed)
I agree with the above plan 

## 2020-03-09 ENCOUNTER — Encounter: Payer: 59 | Attending: Psychology | Admitting: Psychology

## 2020-03-09 ENCOUNTER — Other Ambulatory Visit: Payer: Self-pay

## 2020-03-09 DIAGNOSIS — R202 Paresthesia of skin: Secondary | ICD-10-CM | POA: Diagnosis not present

## 2020-03-09 DIAGNOSIS — I69919 Unspecified symptoms and signs involving cognitive functions following unspecified cerebrovascular disease: Secondary | ICD-10-CM | POA: Insufficient documentation

## 2020-03-09 DIAGNOSIS — I639 Cerebral infarction, unspecified: Secondary | ICD-10-CM | POA: Diagnosis not present

## 2020-03-09 DIAGNOSIS — G4719 Other hypersomnia: Secondary | ICD-10-CM | POA: Diagnosis not present

## 2020-03-09 DIAGNOSIS — I69398 Other sequelae of cerebral infarction: Secondary | ICD-10-CM | POA: Diagnosis not present

## 2020-03-09 DIAGNOSIS — F0630 Mood disorder due to known physiological condition, unspecified: Secondary | ICD-10-CM | POA: Insufficient documentation

## 2020-03-10 ENCOUNTER — Encounter: Payer: Self-pay | Admitting: Psychology

## 2020-03-10 ENCOUNTER — Encounter: Payer: 59 | Admitting: Psychology

## 2020-03-10 ENCOUNTER — Encounter (HOSPITAL_BASED_OUTPATIENT_CLINIC_OR_DEPARTMENT_OTHER): Payer: 59 | Admitting: Psychology

## 2020-03-10 DIAGNOSIS — G4719 Other hypersomnia: Secondary | ICD-10-CM | POA: Diagnosis not present

## 2020-03-10 DIAGNOSIS — I69919 Unspecified symptoms and signs involving cognitive functions following unspecified cerebrovascular disease: Secondary | ICD-10-CM | POA: Diagnosis not present

## 2020-03-10 DIAGNOSIS — I639 Cerebral infarction, unspecified: Secondary | ICD-10-CM | POA: Diagnosis not present

## 2020-03-10 DIAGNOSIS — I69398 Other sequelae of cerebral infarction: Secondary | ICD-10-CM | POA: Diagnosis not present

## 2020-03-10 DIAGNOSIS — R202 Paresthesia of skin: Secondary | ICD-10-CM | POA: Diagnosis not present

## 2020-03-10 DIAGNOSIS — F0630 Mood disorder due to known physiological condition, unspecified: Secondary | ICD-10-CM

## 2020-03-10 NOTE — Progress Notes (Signed)
Today's visit was a formal neuropsychological evaluation/neurobehavioral diagnostic interview that was conducted with the patient regarding residual effects of her recent stroke that occurred on 07/29/2019.  Initially, 1 hour was spent in this clinical interview and another hour was spent with records review, report preparation and conceptualization of presentation for treatment planning and interventions.  The complete right up will be made tomorrow and will be available in her EMR dated 03/10/2020.  During the interview today the patient had clear difficulties with attention and concentration and focus and descriptions of increased anxiety and depressive symptoms resulting from slow recovery from her stroke events and physical/behavioral patterns consistent with left-handed parathesis.  Please see complete report dated 03/10/2020.

## 2020-03-10 NOTE — Progress Notes (Signed)
Neuropsychological Consultation   Patient:   Tara [name]   DOB:   1965-11-15  MR Number:  [id number]  Location:  [facility] [place], [place] [id number] [facility] [place] [address] Dept: [phone]           Date of Service:   03/10/2020  Start Time:   9 AM End Time:   10 AM today's visit was a completion of a 2-hour appointment that initiated on 03/10/2020 with a formal in person clinical interview with the patient.  The second hour of this appointment was completed today with further review of medical records, completion of formal report with recommendations for therapeutic interventions and treatment planning.  Provider/Observer:  Ilean Skill, Psy.D.       Clinical Neuropsychologist       Billing Code/Service: 25366  Chief Complaint:    Tara [name] is a 55 year old female referred by Frann Rider, NP with [facility] for neuropsychological consultation.  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  The patient had a cerebrovascular accident on 07/29/2019 and is continued to have difficulties with adjustment and residual cognitive deficits and changes, changes in mood and residual left-handed paresthesias.  She has significant fear about stroke and other vascular events with personal experience needs of her father dying of a heart attack when he was 26 years old and her aunt recently dying of a stroke at 53 years old.  Reason for Service:  [name] is a 54 year old female referred by Frann Rider, NP with [facility] for neuropsychological consultation.  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  The patient had a cerebrovascular accident on 07/29/2019 and is continued to have difficulties with  adjustment and residual cognitive deficits and changes, changes in mood and residual left-handed paresthesias.  She has significant fear about stroke and other vascular events with personal experience needs of her father dying of a heart attack when he was 39 years old and her aunt recently dying of a stroke at 2 years old.  The patient had a cerebrovascular accident that occurred on 07/29/2019 after presenting with reports of headache the prior 2 days and waking up with left upper extremity numbness and weakness, severe dysarthria and dysphagia on 07/29/2019.  The patient had a thorough neurological work-up for her stroke revealing 2 tiny right parietal infarcts that were embolic in nature secondary to unknown source.  There were also small infarcts in the cerebellum that were initially felt to be chronic in nature but Dr. Leonie Man felt there were likely flow-voids and not old strokes.  CT head displayed hyperdense right MCA bifurcation and MRI indicated 2 tiny acute cortical right brain (posterior middle frontal gyrus and posterior right parietal lobe) findings.  A loop recorder was placed to rule out A. fib as a possible cause of the stroke.  Hypercoagulable work-up was negative.  Patient had no history of hypertension or diabetes.  Other stroke factors noted included migraines but no prior history of stroke.  The patient was discharged in stable condition.  The patient was followed up with neurological consult on 08/13/2019.  She described residual stroke effects including mild left-sided weakness, dizziness, and balance issues.  She had begun working on PT/OT therapies.  She described increased fatigue and feelings like her "mind is slower/delayed.  She had not returned back to  work.  She was continuing on DAPT as well as completing 3-week Plavix course.  Loop recorder did not show indications of A. fib.  Continued follow-up visits with Dr. Leonie Man including concerns over ongoing cognitive difficulties, fatigue and  dizziness.  She described complaints of transient positional dizziness when she looks down and she was getting vestibular therapy that she felt was helping.  She had also been referred for speech therapy.  She felt that she was having slow and steady progress but she was still not ready to return to work.  No further recent strokes or TIA symptoms were reported.  She also complained of intermittent numbness and tightness in her left middle 2 fingers.  The patient is continued to describe residual deficits including cognitive impairments particular around attention and concentration and distractibility, dysarthria, imbalance, left-handed weakness and left-handed numbness along with excessive fatigue.  She has completed speech therapy and had met rehab goals but there were ongoing issues with anxiety and depression, dizziness, numbness and weakness in her left hand etc.  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  The patient also described memory issues around free recall of information but will later remember it in cueing will help.  Ongoing difficulties with attention and concentration, maintaining sustained attentional focus and distractibility.  She described difficulty going into crowded spaces due to feeling visually overstimulated and will begin to feel very anxious, dizzy and fatigued.  She reports that she will start on task and become distracted and move on to other task before she is completed and be unaware of it she has not completed the previous task.  This pattern is frequently being noted by her husband.  The patient does report that she began noticing some slight change in her emotional response to various things approximately 6 or 7 years ago but is unsure of exactly when.  The patient reports that  she had already begun feeling more easily irritated but her current symptoms are clearly well beyond those symptoms.  The patient has a lot of fear and worry about potential for cerebrovascular events and/or heart attacks as she has had direct family experience with her father dying when she was young (he was 23 years old) and her aunt dying at the age of 77 due to stroke.  The patient describes ongoing difficulties including slower movement mobility along with gait disturbance, which has required her utilizing a walking pole for support at times.  She also describes reduced information processing speed, difficulties with focusing and attentional changes especially when trying to complete multiple task and being visually overstimulated in crowded places and getting dizzy at times.  The patient reports that her sleep patterns are disturbed including going to bed and falling asleep without difficulty but once she is awakened in the middle the night having significant difficulty going back to sleep.  Behavioral Observation: Tariana Moldovan  presents as a 54 y.o.-year-old Right handed Female who appeared her stated age. her dress was Appropriate and she was Well Groomed and her manners were Appropriate to the situation.  her participation was indicative of Appropriate and Redirectable behaviors.  There were physical disabilities noted and the patient continually held her pulled on her left hand particularly her middle fingers likely due to changes in sensation.  she displayed an appropriate level of cooperation and motivation.  Interactions:    Active Appropriate and Redirectable  Attention:   abnormal and attention span appeared shorter than expected for age  Memory:   abnormal; remote memory intact, recent memory impaired  Visuo-spatial:  not examined  Speech (Volume):  normal  Speech:   normal; normal  Thought Process:  Coherent and Relevant  Though Content:  WNL; not suicidal and not  homicidal  Orientation:   person, place, time/date and situation  Judgment:   Fair  Planning:   Poor  Affect:    Anxious and Depressed  Mood:    Dysphoric  Insight:   Good  Intelligence:   normal  Marital Status/Living: The patient was born in [place] along with 6 siblings.  She currently lives with her husband of 31 years and this is her only marriage.  The patient has a 64 year old son and a 2 year old son without any neurological or psychiatric difficulties.  Current Employment: The patient had been working as a Public relations account executive with [employer] but her ongoing difficulties have significantly impacted this work.  Past Employment:  The patient had prior work as a Theatre stage manager with [employer] and worked as an Therapist, sports with [employer] as well as prior working at the [employer] center and [employer].  She has been with [employer] in various capacities for the past 21 years.  Hobbies and interests include cooking, gardening, decorating and traveling.  Substance Use:  No concerns of substance abuse are reported.    Education:   The patient completed high school and graduated with her bachelor's of science in nursing.  Medical History:   Past Medical History:  Diagnosis Date  . Asthma   . Hyperlipidemia   . Migraine   . Rotator cuff tear   . Spondylolisthesis    L4-5        Psychiatric History:  No prior psychiatric history noted.  Family Med/Psych History:  Family History  Problem Relation Age of Onset  . Heart disease Father 71       MI  . Hypertension Maternal Grandmother   . Hyperlipidemia Brother     Risk of Suicide/Violence: virtually non-existent the patient denies any suicidal or homicidal ideation.  Impression/DX:  Tara Felty A. Forsee is a 54 year old female referred by Frann Rider, NP with [facility] for neuropsychological consultation.  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  The patient had a cerebrovascular accident on 07/29/2019 and is continued to have difficulties with adjustment and residual cognitive deficits and changes, changes in mood and residual left-handed paresthesias.  She has significant fear about stroke and other vascular events with personal experience needs of her father dying of a heart attack when he was 6 years old and her aunt recently dying of a stroke at 89 years old.  The patient is clearly having adjustment difficulties with residual effects of her cerebrovascular accident including significant anxiety and depression which are exacerbating residual effects of her cerebrovascular accident that occurred in March of this year.  The patient has not been able to return to work.  Disposition/Plan:  We have set the patient up for psychotherapeutic interventions to work on coping and adjustment to the residual effects of her CVA.  The patient has residual mood disturbance including depression anxiety and increased fear for her own mortality with prior experiences of family members having vascular related deaths.  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

## 2020-03-11 DIAGNOSIS — I6930 Unspecified sequelae of cerebral infarction: Secondary | ICD-10-CM | POA: Diagnosis not present

## 2020-03-11 DIAGNOSIS — R5383 Other fatigue: Secondary | ICD-10-CM | POA: Diagnosis not present

## 2020-03-11 DIAGNOSIS — Z5181 Encounter for therapeutic drug level monitoring: Secondary | ICD-10-CM | POA: Diagnosis not present

## 2020-03-11 DIAGNOSIS — Z23 Encounter for immunization: Secondary | ICD-10-CM | POA: Diagnosis not present

## 2020-03-11 DIAGNOSIS — E7800 Pure hypercholesterolemia, unspecified: Secondary | ICD-10-CM | POA: Diagnosis not present

## 2020-03-11 DIAGNOSIS — R202 Paresthesia of skin: Secondary | ICD-10-CM | POA: Diagnosis not present

## 2020-03-16 DIAGNOSIS — T148XXA Other injury of unspecified body region, initial encounter: Secondary | ICD-10-CM | POA: Diagnosis not present

## 2020-03-25 ENCOUNTER — Ambulatory Visit: Payer: 59 | Admitting: Neurology

## 2020-03-25 ENCOUNTER — Other Ambulatory Visit: Payer: Self-pay

## 2020-03-25 ENCOUNTER — Ambulatory Visit (INDEPENDENT_AMBULATORY_CARE_PROVIDER_SITE_OTHER): Payer: 59 | Admitting: Neurology

## 2020-03-25 DIAGNOSIS — R202 Paresthesia of skin: Secondary | ICD-10-CM

## 2020-03-25 DIAGNOSIS — G5603 Carpal tunnel syndrome, bilateral upper limbs: Secondary | ICD-10-CM

## 2020-03-25 DIAGNOSIS — Z0289 Encounter for other administrative examinations: Secondary | ICD-10-CM

## 2020-03-25 NOTE — Progress Notes (Signed)
See procedure note.

## 2020-03-25 NOTE — Progress Notes (Signed)
Full Name: Tara [name] Gender: Female MRN #: [id number] Date of Birth: [birth year]    Visit Date: 03/25/2020 09:19 Age: 54 Years Examining Physician: Naomie Dean, MD  Referring Physician: Ihor Austin, NP Height: 5 feet 4 inch  History: Left finger numbness and hand pain  Summary: Nerve Conduction Studies were performed on the bilateral upper extremities.  The right median APB motor nerve showed prolonged distal onset latency (5.6 ms, N<4.4). The left  median APB motor nerve showed prolonged distal onset latency (7.3 ms, N<4.4). The right Median 2nd Digit orthodromic sensory nerve showed prolonged distal peak latency (4.2 ms, N<3.4). The left  Median 2nd Digit orthodromic sensory nerve showed prolonged distal peak latency (5.2 ms, N<3.4) and reduced amplitude(7 uV, N>10). The right median/ulnar (palm) comparison nerve showed prolonged distal peak latency (Median Palm, 3.1 ms, N<2.2) and abnormal peak latency difference (Median Palm-Ulnar Palm, 1.3 ms, N<0.4) with a relative median delay. The left median/ulnar (palm) comparison nerve showed prolonged distal peak latency (Median Palm, 3.6 ms, N<2.2) and abnormal peak latency difference (Median Palm-Ulnar Palm, 1.7 ms, N<0.4) with a relative median delay.     Conclusion: This is an abnormal study. There is electrophysiologic evidence of bilateral moderately-severe  left > right Carpal Tunnel Syndrome.  No suggestion of polyneuropathy  or radiculopathy.  ------------------------------- Naomie Dean M.D.  [facility] [address] [place], [place] [address] Tel: [phone] Fax: [phone]  Verbal informed consent was obtained from the patient, patient was informed of potential risk of procedure, including bruising, bleeding, hematoma formation, infection, muscle weakness, muscle pain, numbness, among others.        MNC    Nerve / Sites Muscle Latency Ref. Amplitude Ref. Rel Amp Segments  Distance Velocity Ref. Area    ms ms mV mV %  cm m/s m/s mVms  L Median - APB     Wrist APB 7.3 ?4.4 5.2 ?4.0 100 Wrist - APB 7   20.6     Upper arm APB 11.5  4.9  94.3 Upper arm - Wrist 21 49 ?49 18.8  R Median - APB     Wrist APB 5.6 ?4.4 5.8 ?4.0 100 Wrist - APB 7   21.8     Upper arm APB 9.6  5.0  86.2 Upper arm - Wrist 21 52 ?49 19.2  L Ulnar - ADM     Wrist ADM 3.1 ?3.3 12.6 ?6.0 100 Wrist - ADM 7   46.2     B.Elbow ADM 6.0  12.1  95.8 B.Elbow - Wrist 19 65 ?49 43.2     A.Elbow ADM 7.5  12.3  102 A.Elbow - B.Elbow 10 68 ?49 44.3           SNC    Nerve / Sites Rec. Site Peak Lat Ref.  Amp Ref. Segments Distance Peak Diff Ref.    ms ms V V  cm ms ms  L Median, Ulnar - Transcarpal comparison     Median Palm Wrist 3.6 ?2.2 18 ?35 Median Palm - Wrist 8       Ulnar Palm Wrist 1.9 ?2.2 38 ?12 Ulnar Palm - Wrist 8          Median Palm - Ulnar Palm  1.7 ?0.4  R Median, Ulnar - Transcarpal comparison     Median Palm Wrist 3.1 ?2.2 39 ?35 Median Palm - Wrist 8       Ulnar Palm Wrist 1.9 ?2.2 60 ?12  Ulnar Palm - Wrist 8          Median Palm - Ulnar Palm  1.3 ?0.4  L Median - Orthodromic (Dig II, Mid palm)     Dig II Wrist 5.2 ?3.4 7 ?10 Dig II - Wrist 13    R Median - Orthodromic (Dig II, Mid palm)     Dig II Wrist 4.2 ?3.4 12 ?10 Dig II - Wrist 13    L Ulnar - Orthodromic, (Dig V, Mid palm)     Dig V Wrist 2.6 ?3.1 14 ?5 Dig V - Wrist 23                 F  Wave    Nerve F Lat Ref.   ms ms  L Ulnar - ADM 26.1 ?32.0       EMG Summary Table    Spontaneous MUAP Recruitment  Muscle IA Fib PSW Fasc Other Amp Dur. Poly Pattern  L. Deltoid Normal None None None _______ Normal Normal Normal Normal  L. Triceps brachii Normal None None None _______ Normal Normal Normal Normal  L. Pronator teres Normal None None None _______ Normal Normal Normal Normal  L. First dorsal interosseous Normal None None None _______ Normal Normal Normal Normal  L. Opponens pollicis Normal None None None  _______ Normal Normal Normal Normal  L. Cervical paraspinals (low) Normal None None None _______ Normal Normal Normal Normal

## 2020-03-25 NOTE — Patient Instructions (Signed)
Carpal Tunnel Syndrome  Carpal tunnel syndrome is a condition that causes pain in your hand and arm. The carpal tunnel is a narrow area that is on the palm side of your wrist. Repeated wrist motion or certain diseases may cause swelling in the tunnel. This swelling can pinch the main nerve in the wrist (median nerve). What are the causes? This condition may be caused by:  Repeated wrist motions.  Wrist injuries.  Arthritis.  A sac of fluid (cyst) or abnormal growth (tumor) in the carpal tunnel.  Fluid buildup during pregnancy. Sometimes the cause is not known. What increases the risk? The following factors may make you more likely to develop this condition:  Having a job in which you move your wrist in the same way many times. This includes jobs like being a butcher or a cashier.  Being a woman.  Having other health conditions, such as: ? Diabetes. ? Obesity. ? A thyroid gland that is not active enough (hypothyroidism). ? Kidney failure. What are the signs or symptoms? Symptoms of this condition include:  A tingling feeling in your fingers.  Tingling or a loss of feeling (numbness) in your hand.  Pain in your entire arm. This pain may get worse when you bend your wrist and elbow for a long time.  Pain in your wrist that goes up your arm to your shoulder.  Pain that goes down into your palm or fingers.  A weak feeling in your hands. You may find it hard to grab and hold items. You may feel worse at night. How is this diagnosed? This condition is diagnosed with a medical history and physical exam. You may also have tests, such as:  Electromyogram (EMG). This test checks the signals that the nerves send to the muscles.  Nerve conduction study. This test checks how well signals pass through your nerves.  Imaging tests, such as X-rays, ultrasound, and MRI. These tests check for what might be the cause of your condition. How is this treated? This condition may be treated  with:  Lifestyle changes. You will be asked to stop or change the activity that caused your problem.  Doing exercise and activities that make bones and muscles stronger (physical therapy).  Learning how to use your hand again (occupational therapy).  Medicines for pain and swelling (inflammation). You may have injections in your wrist.  A wrist splint.  Surgery. Follow these instructions at home: If you have a splint:  Wear the splint as told by your doctor. Remove it only as told by your doctor.  Loosen the splint if your fingers: ? Tingle. ? Lose feeling (become numb). ? Turn cold and blue.  Keep the splint clean.  If the splint is not waterproof: ? Do not let it get wet. ? Cover it with a watertight covering when you take a bath or a shower. Managing pain, stiffness, and swelling   If told, put ice on the painful area: ? If you have a removable splint, remove it as told by your doctor. ? Put ice in a plastic bag. ? Place a towel between your skin and the bag. ? Leave the ice on for 20 minutes, 2-3 times per day. General instructions  Take over-the-counter and prescription medicines only as told by your doctor.  Rest your wrist from any activity that may cause pain. If needed, talk with your boss at work about changes that can help your wrist heal.  Do any exercises as told by your doctor,   physical therapist, or occupational therapist.  Keep all follow-up visits as told by your doctor. This is important. Contact a doctor if:  You have new symptoms.  Medicine does not help your pain.  Your symptoms get worse. Get help right away if:  You have very bad numbness or tingling in your wrist or hand. Summary  Carpal tunnel syndrome is a condition that causes pain in your hand and arm.  It is often caused by repeated wrist motions.  Lifestyle changes and medicines are used to treat this problem. Surgery may help in very bad cases.  Follow your doctor's  instructions about wearing a splint, resting your wrist, keeping follow-up visits, and calling for help. This information is not intended to replace advice given to you by your health care provider. Make sure you discuss any questions you have with your health care provider. Document Revised: 09/21/2017 Document Reviewed: 09/21/2017 Elsevier Patient Education  2020 Elsevier Inc.  

## 2020-03-27 LAB — CUP PACEART REMOTE DEVICE CHECK
Date Time Interrogation Session: 20211025230537
Implantable Pulse Generator Implant Date: 20210303

## 2020-03-29 ENCOUNTER — Ambulatory Visit (INDEPENDENT_AMBULATORY_CARE_PROVIDER_SITE_OTHER): Payer: 59

## 2020-03-29 DIAGNOSIS — I639 Cerebral infarction, unspecified: Secondary | ICD-10-CM | POA: Diagnosis not present

## 2020-03-29 NOTE — Procedures (Signed)
      Full Name: Tara [name] Gender: Female MRN #: [id number] Date of Birth: 06/28/1965    Visit Date: 03/25/2020 09:19 Age: 54 Years Examining Physician: Alexandr Yaworski, MD  Referring Physician: Jessica McCue, NP Height: 5 feet 4 inch  History: Left finger numbness and hand pain  Summary: Nerve Conduction Studies were performed on the bilateral upper extremities.  The right median APB motor nerve showed prolonged distal onset latency (5.6 ms, N<4.4). The left  median APB motor nerve showed prolonged distal onset latency (7.3 ms, N<4.4). The right Median 2nd Digit orthodromic sensory nerve showed prolonged distal peak latency (4.2 ms, N<3.4). The left  Median 2nd Digit orthodromic sensory nerve showed prolonged distal peak latency (5.2 ms, N<3.4) and reduced amplitude(7 uV, N>10). The right median/ulnar (palm) comparison nerve showed prolonged distal peak latency (Median Palm, 3.1 ms, N<2.2) and abnormal peak latency difference (Median Palm-Ulnar Palm, 1.3 ms, N<0.4) with a relative median delay. The left median/ulnar (palm) comparison nerve showed prolonged distal peak latency (Median Palm, 3.6 ms, N<2.2) and abnormal peak latency difference (Median Palm-Ulnar Palm, 1.7 ms, N<0.4) with a relative median delay.     Conclusion: This is an abnormal study. There is electrophysiologic evidence of bilateral moderately-severe  left > right Carpal Tunnel Syndrome.  No suggestion of polyneuropathy  or radiculopathy.  ------------------------------- Makailyn Mccormick M.D.  [facility] [address], [address] [place], [place] [address] Tel: [phone] Fax: [phone]  Verbal informed consent was obtained from the patient, patient was informed of potential risk of procedure, including bruising, bleeding, hematoma formation, infection, muscle weakness, muscle pain, numbness, among others.        MNC    Nerve / Sites Muscle Latency Ref. Amplitude Ref. Rel Amp Segments  Distance Velocity Ref. Area    ms ms mV mV %  cm m/s m/s mVms  L Median - APB     Wrist APB 7.3 ?4.4 5.2 ?4.0 100 Wrist - APB 7   20.6     Upper arm APB 11.5  4.9  94.3 Upper arm - Wrist 21 49 ?49 18.8  R Median - APB     Wrist APB 5.6 ?4.4 5.8 ?4.0 100 Wrist - APB 7   21.8     Upper arm APB 9.6  5.0  86.2 Upper arm - Wrist 21 52 ?49 19.2  L Ulnar - ADM     Wrist ADM 3.1 ?3.3 12.6 ?6.0 100 Wrist - ADM 7   46.2     B.Elbow ADM 6.0  12.1  95.8 B.Elbow - Wrist 19 65 ?49 43.2     A.Elbow ADM 7.5  12.3  102 A.Elbow - B.Elbow 10 68 ?49 44.3           SNC    Nerve / Sites Rec. Site Peak Lat Ref.  Amp Ref. Segments Distance Peak Diff Ref.    ms ms V V  cm ms ms  L Median, Ulnar - Transcarpal comparison     Median Palm Wrist 3.6 ?2.2 18 ?35 Median Palm - Wrist 8       Ulnar Palm Wrist 1.9 ?2.2 38 ?12 Ulnar Palm - Wrist 8          Median Palm - Ulnar Palm  1.7 ?0.4  R Median, Ulnar - Transcarpal comparison     Median Palm Wrist 3.1 ?2.2 39 ?35 Median Palm - Wrist 8       Ulnar Palm Wrist 1.9 ?2.2 60 ?12   Ulnar Palm - Wrist 8          Median Palm - Ulnar Palm  1.3 ?0.4  L Median - Orthodromic (Dig II, Mid palm)     Dig II Wrist 5.2 ?3.4 7 ?10 Dig II - Wrist 13    R Median - Orthodromic (Dig II, Mid palm)     Dig II Wrist 4.2 ?3.4 12 ?10 Dig II - Wrist 13    L Ulnar - Orthodromic, (Dig V, Mid palm)     Dig V Wrist 2.6 ?3.1 14 ?5 Dig V - Wrist 11                 F  Wave    Nerve F Lat Ref.   ms ms  L Ulnar - ADM 26.1 ?32.0       EMG Summary Table    Spontaneous MUAP Recruitment  Muscle IA Fib PSW Fasc Other Amp Dur. Poly Pattern  L. Deltoid Normal None None None _______ Normal Normal Normal Normal  L. Triceps brachii Normal None None None _______ Normal Normal Normal Normal  L. Pronator teres Normal None None None _______ Normal Normal Normal Normal  L. First dorsal interosseous Normal None None None _______ Normal Normal Normal Normal  L. Opponens pollicis Normal None None None  _______ Normal Normal Normal Normal  L. Cervical paraspinals (low) Normal None None None _______ Normal Normal Normal Normal       

## 2020-03-30 ENCOUNTER — Telehealth: Payer: Self-pay | Admitting: Adult Health

## 2020-03-30 ENCOUNTER — Ambulatory Visit: Payer: 59 | Attending: Internal Medicine

## 2020-03-30 ENCOUNTER — Other Ambulatory Visit: Payer: Self-pay | Admitting: Adult Health

## 2020-03-30 ENCOUNTER — Telehealth: Payer: Self-pay | Admitting: *Deleted

## 2020-03-30 DIAGNOSIS — R202 Paresthesia of skin: Secondary | ICD-10-CM

## 2020-03-30 DIAGNOSIS — G5603 Carpal tunnel syndrome, bilateral upper limbs: Secondary | ICD-10-CM

## 2020-03-30 DIAGNOSIS — Z23 Encounter for immunization: Secondary | ICD-10-CM

## 2020-03-30 NOTE — Telephone Encounter (Signed)
Pt is asking if Jessica,NP needs to send a referral to the cardiologist for her to be seen about the hole in her heart or if this is a call she(the pt) can make on her own.  Please call

## 2020-03-30 NOTE — Telephone Encounter (Signed)
I called pt and relayed to her the [place]/EMG results.  Referral placed to [facility], I gave her there # [phone].  I faxed to [facility] ortho referral for CTS L>R. [phone]. Pt verbalized understanding.

## 2020-03-30 NOTE — Telephone Encounter (Signed)
-----   Message from Ihor Austin, NP sent at 03/30/2020  9:19 AM EDT ----- Please advise patient that recent EMG/NCV did show bilateral moderate to severe L>R carpal tunnel syndrome. L hand symptomatic progressing interfering with daily activity therefore will refer to orthopedics for further evaluation.

## 2020-03-30 NOTE — Progress Notes (Signed)
   Covid-19 Vaccination Clinic  Name:  Rowen Hur    MRN: [id number] DOB: 04-Apr-1966  03/30/2020  Ms. Arista was observed post Covid-19 immunization for 15 minutes without incident. She was provided with Vaccine Information Sheet and instruction to access the V-Safe system.   Ms. Ronk was instructed to call 911 with any severe reactions post vaccine: Marland Kitchen Difficulty breathing  . Swelling of face and throat  . A fast heartbeat  . A bad rash all over body  . Dizziness and weakness

## 2020-03-31 NOTE — Telephone Encounter (Signed)
Spoke to pt and relayed that per JM/NP 03-01-20  that " PFO -She continues to hold off on closure procedure due to COVID-19 concerns -She is aware to reach out to cardiology when she is interested in proceeding".  I relayed will forward message to them at [facility] that she is wanting to proceed.  Her insurance is asking for CPT codes.  She will be intouch this afternoon.

## 2020-03-31 NOTE — Progress Notes (Signed)
[facility] Summary Report / Loop Recorder 

## 2020-04-01 ENCOUNTER — Ambulatory Visit: Payer: 59 | Admitting: Neurology

## 2020-04-01 ENCOUNTER — Encounter: Payer: Self-pay | Admitting: Neurology

## 2020-04-01 ENCOUNTER — Other Ambulatory Visit: Payer: Self-pay

## 2020-04-01 ENCOUNTER — Ambulatory Visit: Payer: 59 | Admitting: Psychology

## 2020-04-01 VITALS — BP 118/80 | HR 77 | Ht 64.0 in | Wt 158.0 lb

## 2020-04-01 DIAGNOSIS — Q211 Atrial septal defect: Secondary | ICD-10-CM

## 2020-04-01 DIAGNOSIS — G4710 Hypersomnia, unspecified: Secondary | ICD-10-CM | POA: Insufficient documentation

## 2020-04-01 DIAGNOSIS — I5189 Other ill-defined heart diseases: Secondary | ICD-10-CM | POA: Diagnosis not present

## 2020-04-01 DIAGNOSIS — I63411 Cerebral infarction due to embolism of right middle cerebral artery: Secondary | ICD-10-CM | POA: Insufficient documentation

## 2020-04-01 DIAGNOSIS — R0683 Snoring: Secondary | ICD-10-CM | POA: Diagnosis not present

## 2020-04-01 DIAGNOSIS — Q2112 Patent foramen ovale: Secondary | ICD-10-CM

## 2020-04-01 NOTE — Patient Instructions (Signed)

## 2020-04-01 NOTE — Progress Notes (Signed)
Patient of Dr. Delia Heady, MD.

## 2020-04-01 NOTE — Progress Notes (Signed)
SLEEP MEDICINE CLINIC    Provider:  Melvyn Novas, MD  Primary Care Physician:  [name], MD [address] [place] [address]     Referring Provider: Delia Heady, MD  And Rainey Pines, NP   STROKE Service.         Chief Complaint according to patient   Patient presents with:    . New Patient (Initial Visit)           HISTORY OF PRESENT ILLNESS:  Dagmar Adcox is a 54 y.o. 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 She is a hs ospital  Employee, [employer] liasion and Charity fundraiser. She  has a past medical history of Asthma, Hyperlipidemia, Migraine, Rotator cuff tear, and Spondylolisthesis.Artisha Capri is a 54 y.o. year old female presentedShe with LUE numbness and weakness along with severe dysarthria and dysphagia on 07/29/2019 with stroke work-up revealing 2 tiny right parietal infarcts deemed of Embolic origin.Marland Kitchen  Loop recorder placed which has not shown atrial fibrillation thus far.  Also evidence of PFO with unclear relation to stroke.  Vascular risk factors include HLD and migraines.   She has of small PFO for which she wants to wait on elective endovascular closure.  She was evaluated for possible participation in [place] trial unfortunately did not meet criteria    R parietal strokes, cryptogenic -Residual deficits: Cognitive impairment, dizziness/imbalance, decreased left hand dexterity with paresthesias, excessive fatigue and post stroke anxiety/depression.  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  Currently on long-term disability through PCP and in the process of applying for Social Security disability.  May possibly benefit from vocational rehab.  This can be further  discussed at follow-up visit and after evaluation with Dr. Kieth Brightly. -Scheduled evaluation with Dr. Kieth Brightly 10/13 -Loop recorder has not shown atrial fibrillation thus far -continue monthly monitoring by cardiology -Continue aspirin and Crestor 20 mg daily for stroke prevention -maintain aggressive risk factor modification with strict control of hypertension blood pressure goal below 130/90 and lipids with LDL cholesterol goal below 70 mg percent.  PFO -She continues to hold off on closure procedure due to COVID-19 concerns -She is aware to reach out to cardiology when she is interested in proceeding  Depression/anxiety, post stroke Adjustment disorder, poststroke -Scheduled initial evaluation with Dr. Kieth Brightly 10/13 -GAD-7 score 8; PHQ-9 10 -She continues to decline interest in medication management and is looking forward to evaluation with Dr. Kieth Brightly  Daytime fatigue -Likely multifactorial including post stroke fatigue, underlying depression/anxiety and residual stroke deficits -Referral placed to [facility] sleep clinic to further assess for possible underlying sleep apnea due to prior stroke, occasional snoring and history of migraines  Left hand paresthesias -Likely post stroke as symptom distribution not clearly consistent with specific nerve root/entrapment but as symptoms worsen while sleeping or with increased use,- [name] agreed to order History: Left finger numbness and hand pain  Summary: Nerve Conduction Studies were performed on the bilateral upper extremities. The right median APB motor nerve showed prolonged distal onset latency (5.6 ms, N<4.4). The left  median APB motor nerve showed prolonged distal onset latency (7.3 ms, N<4.4). The right Median 2nd Digit orthodromic sensory nerve  showed prolonged distal peak latency (4.2 ms, N<3.4). The left  Median 2nd Digit orthodromic sensory nerve showed prolonged distal peak latency (5.2 ms, N<3.4) and reduced amplitude(7 uV,  N>10). The right median/ulnar (palm) comparison nerve showed prolonged distal peak latency (Median Palm, 3.1 ms, N<2.2) and abnormal peak latency difference (Median Palm-Ulnar Palm, 1.3 ms, N<0.4) with a relative median delay. The left median/ulnar (palm) comparison nerve showed prolonged distal peak latency (Median Palm, 3.6 ms, N<2.2) and abnormal peak latency difference (Median Palm-Ulnar Palm, 1.7 ms, N<0.4) with a relative median delay.     Conclusion: This is an abnormal study. There is electrophysiologic evidence of bilateral moderately-severe  left > right Carpal Tunnel Syndrome.  No suggestion of polyneuropathy or radiculopathy.  ------------------------------- Naomie Dean M.D.  EMG/NCV to rule out possible underlying causes -Discussed use of compression glove or wrist splint for possible benefit.    The patient never had a sleep study . She still has her implanted loop recorder- no atrial fib-.   Nocturia 3-5 times.    Family medical /sleep history: No other family member on CPAP with OSA,  husband has OSA. There is no CVA history, father died at age 64 of a heart attack. She was raised at one of 4 children.  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    The patient currently  On medical leave- Tobacco OFB:[employer] .  ETOH use ; None ,  Caffeine intake in form of Coffee( /) Soda( rarely) Tea ( ) or energy drinks. Regular exercise in form of  Walking, [place], [place].       Sleep habits are as follows: The patient's dinner time is between 8-9 PM. The patient goes to bed at 11 PM and continues to sleep for intervals of 2  hours, wakes for bathroom breaks, and due to the left hand pain.  The preferred sleep position is variable , with the support of 1 pillow.  Dreams are reportedly rare.  6.15 AM is the usual rise time. The patient wakes up spontaneously.   She reports not feeling refreshed or restored in AM  since her stroke, which affected  Right brain-, with symptoms such as rare morning headaches, and residual fatigue.  Naps are taken frequently after hospitalization- lasting from 15-20 minutes and are more refreshing than nocturnal sleep.    Review of Systems: Out of a complete 14 system review, the patient complains of only the following symptoms, and all other reviewed systems are negative.:  Fatigue, sleepiness ,  snoring, fragmented sleep due to nocturia.    How likely are you to doze in the following situations: 0 = not likely, 1 = slight chance, 2 = moderate chance, 3 = high chance   Sitting and Reading? Watching Television? Sitting inactive in a public place (theater or meeting)? As a passenger in a car for an hour without a break? Lying down in the afternoon when circumstances permit? Sitting and talking to someone? Sitting quietly after lunch without alcohol? In a car, while stopped for a few minutes in traffic?   Total =12/ 24 points   FSS endorsed at 42/ 63 points.   Social History   Socioeconomic History  . Marital status: Married    Spouse name: Not on file  . Number of children: 2  . Years of education: BSN  . Highest education level: Not on file  Occupational History  . Occupation: [employer]  Tobacco Use  . Smoking status: Never Smoker  . Smokeless tobacco: Never Used  Substance and Sexual Activity  . Alcohol use: No    Alcohol/week: 0.0 standard drinks    Comment: Rare  . Drug use: No  . Sexual activity: Yes    Birth control/protection: None  Other Topics Concern  . Not on file  Social History Narrative   Lives at home w/ her husband and son   Right-handed   Caffeine: seldom, soda on the weekend   Social Determinants of Health   Financial Resource Strain:   . Difficulty of Paying Living Expenses: Not on file  Food Insecurity:   . Worried About Programme researcher, broadcasting/film/videounning Out of Food in the Last Year: Not on file  . Ran Out of Food in the Last Year: Not on file   Transportation Needs:   . Lack of Transportation (Medical): Not on file  . Lack of Transportation (Non-Medical): Not on file  Physical Activity:   . Days of Exercise per Week: Not on file  . Minutes of Exercise per Session: Not on file  Stress:   . Feeling of Stress : Not on file  Social Connections:   . Frequency of Communication with Friends and Family: Not on file  . Frequency of Social Gatherings with Friends and Family: Not on file  . Attends Religious Services: Not on file  . Active Member of Clubs or Organizations: Not on file  . Attends BankerClub or Organization Meetings: Not on file  . Marital Status: Not on file    Family History  Problem Relation Age of Onset  . Heart disease Father [id number]       MI  . Hypertension Maternal Grandmother   . Hyperlipidemia Brother     Past Medical History:  Diagnosis Date  . Asthma   . Hyperlipidemia   . Migraine   . Rotator cuff tear   . Spondylolisthesis    L4-5    Past Surgical History:  Procedure Laterality Date  . BUBBLE STUDY  07/30/2019   Procedure: BUBBLE STUDY;  Surgeon: Little IshikawaSchumann, Christopher L, MD;  [facility];  Service: Cardiovascular;;  . LOOP RECORDER INSERTION N/A 07/30/2019   Procedure: LOOP RECORDER INSERTION;  Surgeon: Marinus Mawaylor, Gregg W, MD;  [facility];  Service: Cardiovascular;  Laterality: N/A;  . No prior surgery    . TEE WITHOUT CARDIOVERSION N/A 07/30/2019   Procedure: TRANSESOPHAGEAL ECHOCARDIOGRAM (TEE);  Surgeon: Little IshikawaSchumann, Christopher L, MD;  [facility];  Service: Cardiovascular;  Laterality: N/A;     Current Outpatient Medications on File Prior to Visit  Medication Sig Dispense Refill  . aspirin EC 81 MG EC tablet Take 1 tablet (81 mg total) by mouth daily.    . cholecalciferol (VITAMIN D) 1000 UNITS tablet Take 1,000 Units by mouth daily.    . Coenzyme Q10 (COQ10 PO) Take by mouth.    . Diclofenac Potassium,Migraine, (CAMBIA PO) Take by mouth as needed.    . Omega-3  Fatty Acids (FISH OIL) 500 MG CAPS Natures made Fish oil pearls 500mg  BID (Patient taking differently: Take 1 capsule by mouth daily. Natures made Fish oil pearls 500mg  BID) 180 capsule 1  . Probiotic Product (PROBIOTIC PO) Take 1 capsule by mouth daily.     . rizatriptan (MAXALT) 10 MG tablet Take 10 mg by mouth as needed for migraine (headache).     . rosuvastatin (CRESTOR) 20 MG tablet Take 1 tablet (20 mg total) by mouth daily.  90 tablet 3  . scopolamine (TRANSDERM-SCOP) 1 MG/3DAYS Place 1 patch onto the skin as needed.     No current facility-administered medications on file prior to visit.    Allergies  Allergen Reactions  . Tape Rash    Physical exam:  Today's Vitals   04/01/20 0951  BP: 118/80  Pulse: 77  Weight: 158 lb (71.7 kg)  Height: 5\' 4"  (1.626 m)   Body mass index is 27.12 kg/m.   Wt Readings from Last 3 Encounters:  04/01/20 158 lb (71.7 kg)  03/01/20 160 lb (72.6 kg)  11/18/19 160 lb (72.6 kg)     Ht Readings from Last 3 Encounters:  04/01/20 5\' 4"  (1.626 m)  03/01/20 5\' 4"  (1.626 m)  11/18/19 5\' 4"  (1.626 m)      General: The patient is awake, alert and appears not in acute distress. The patient is well groomed. Head: Normocephalic, atraumatic. Neck is supple. Mallampati 2,  neck circumference: 15 inches . Nasal airflow  patent.  Retrognathia is not  seen.  Tongue deviated to the left, right naso liabal fold is less pronounced.  Dental status: intact.  Cardiovascular:  Regular rate and cardiac rhythm by pulse,  without distended neck veins. Respiratory: Lungs are clear to auscultation.  Skin:  Without evidence of ankle edema, or rash. Trunk: The patient's posture is erect.   Neurologic exam : The patient is awake and alert, oriented to place and time.   Memory subjective described as intact.  Attention span & concentration ability appears normal.  Speech is reluctant ,  without  dysarthria, dysphonia but mild hesitancy- aphasia.  Mood and affect  are appropriate.   Cranial nerves: no loss of smell or taste reported  Pupils are equal and briskly reactive to light. Funduscopic exam deferred. .  Extraocular movements in vertical and horizontal planes were intact and without nystagmus. No Diplopia. Visual fields by finger perimetry are intact. Hearing was intact to soft voice and finger rubbing.    Facial sensation  to fine touch reduced on the left- numbness in lower face. .  Facial motor strength is very slightly asymmetric -  Neck ROM : rotation, tilt and flexion extension were normal for age and shoulder shrug was symmetrical.    Motor exam:  Symmetric bulk, tone and ROM. Hip flexion is stronger in the right than left leg,    Normal tone without cog -wheeling, asymmetric grip strength- left less strength.  .   Sensory:  Fine touch, pinprick and vibration were tested  and  normal.  Proprioception tested in the upper extremities was normal.   Coordination: Rapid alternating movements in the fingers/hands were of reduced  speed. No change in  Handwriting.  The Finger-to-nose maneuver was intact without evidence of ataxia, dysmetria or tremor.   Gait and station: Patient could rise unassisted from a seated position, walked with a hiking pole as assistive device.  Stance is of normal width/ base , fragmented turns.  Toe and heel walk were deferred.  Deep tendon reflexes: in the  upper and lower extremities are asymmetric , trace stronger in the left leg.  Babinski response was deferred.      After spending a total time of  40  minutes face to face and additional time for physical and neurologic examination, review of laboratory studies,  personal review of imaging studies, reports and results of other testing and review of referral information / records as far as provided in visit, I have established the  following assessments:  The patient has mild residual motor and sensory deficits from her right hemispheric stroke. Overlapping  with reported carpal tunnel.  1)implanted loop recorder did thus far not indicate of atrial fibrillation. That is the main risk factor of OSA and embolic stroke relation.  2) hypersomnia and increased fatigue can be related to depression and to the location of the stroke in the right hemispheric.    My Plan is to proceed with:  1) we can either use an attended sleep study or a HST to screen for OSA.    I would like to thank Delia Heady, MD and [name], Md [address] [place],  [place] [address] for allowing me to meet with and to take care of this pleasant patient. Her insurance covers her until end of this month.   In short, Makinna Andy is presenting with   I plan a follow up  through our NP within one month by virtual visit - results of HST only. .   CC: I will share my notes with [name] and Dr Modesto Charon..  Electronically signed by: Melvyn Novas, MD 04/01/2020 10:07 AM  [facility] Board certified by The [employer] of Sleep Medicine and Diplomate of the [employer] of Sleep Medicine. Board certified In Neurology through the ABPN, Fellow of the [employer] of Neurology. Medical Director of [facility].

## 2020-04-06 ENCOUNTER — Telehealth: Payer: Self-pay | Admitting: Neurology

## 2020-04-06 ENCOUNTER — Telehealth: Payer: Self-pay | Admitting: Internal Medicine

## 2020-04-06 ENCOUNTER — Institutional Professional Consult (permissible substitution): Payer: 59 | Admitting: Neurology

## 2020-04-06 ENCOUNTER — Telehealth: Payer: Self-pay

## 2020-04-06 DIAGNOSIS — I639 Cerebral infarction, unspecified: Secondary | ICD-10-CM

## 2020-04-06 DIAGNOSIS — Q211 Atrial septal defect: Secondary | ICD-10-CM

## 2020-04-06 DIAGNOSIS — Q2112 Patent foramen ovale: Secondary | ICD-10-CM

## 2020-04-06 NOTE — Telephone Encounter (Signed)
Pt has called back stating that in response to the request for a call back from Meagan she has spoken with her already.  Pt has asked that Dr Vickey Huger be made aware that the sleep study scheduled doesn't agree with the remaining time of her being insured.  Pt is preferring an in home sleep study.,   Please call

## 2020-04-06 NOTE — Telephone Encounter (Signed)
Patient called and she needs a Order for PFO vascular closer sent to her cardiologist.   Dr. Ladona Ridgel .

## 2020-04-06 NOTE — Telephone Encounter (Signed)
I have talked to patient already and tried to schedule patient but she refuses to schedule when I have availability. I offered to put her on a wait list and will call her if I have a cancellation. Pt is aware of this.

## 2020-04-06 NOTE — Telephone Encounter (Signed)
Noted. Patient has already been contacted by sleep lab. She is on wait list and has been advised of the earliest available appointments. At this time we will just wait to see if anything opens up but the patient should consider at least accepting what available openings we have in the sleep lab so she is at least scheduled. If patient returns call please advise above and send to sleep lab for scheduling of whatever available apts at that time.

## 2020-04-06 NOTE — Telephone Encounter (Signed)
LVM for pt to call me back to schedule sleep study  

## 2020-04-06 NOTE — Telephone Encounter (Signed)
° ° ° °  Pt said she saw Dr. Ladona Ridgel in march in the hospital and was told if she is ready to do endovascular closure to call him. She said her neurologist recommend to get that procedure. She is ready to get the procedure

## 2020-04-06 NOTE — Telephone Encounter (Signed)
Called patient today to schedule sleep study. NPSG and HST was ordered by Dr. Vickey Huger. Pt's insurance will allow for either to be scheduled. Pt informed me that her insurance ends on 04/27/2020.  I offered her inlab dates of 11/24, 11/26, 11/28, 11/29 and 11/30. She declined those dates to schedule. Then patient asked about HST appt and I offered her my only availability on 11/29. Pt refused to schedule stating that was too close to the end of the month and she was worried that insurance would not pay for study. I again offered the 11/24 or 11/26 appt for inlab.  She refused stating she would have company at her house and unable to come. I offered pt to be on a waitlist but that I would need to get her on the schedule. Pt refused to give me a date to schedule her and just asked for me to call her if I have any cancellations for this week. Pt states she only wants to come this week. She refuses to come at any other time.

## 2020-04-07 NOTE — Telephone Encounter (Signed)
Per Dr. Zigmund Gottron referral to Dr. Clifton James to discuss PFO closure.  Order entered.  Pt notified via mychart.

## 2020-04-07 NOTE — Telephone Encounter (Signed)
Appt scheduled with [name].

## 2020-04-07 NOTE — Telephone Encounter (Signed)
I called pt and let her know that order was placed by Dr. Lubertha Basque office and is referral to Dr. Clifton James.  Relayed that they sent message via mychart.  She verbalized understanding.

## 2020-04-08 MED FILL — MODERNA COVID-19 VACCINE 10: 100 | 1 days supply | Qty: 0 | Fill #0

## 2020-04-09 ENCOUNTER — Encounter: Payer: Self-pay | Admitting: Cardiovascular Disease

## 2020-04-09 ENCOUNTER — Other Ambulatory Visit: Payer: Self-pay

## 2020-04-09 ENCOUNTER — Ambulatory Visit: Payer: 59 | Admitting: Cardiovascular Disease

## 2020-04-09 VITALS — BP 116/80 | HR 69 | Ht 64.0 in | Wt 160.6 lb

## 2020-04-09 DIAGNOSIS — Q211 Atrial septal defect: Secondary | ICD-10-CM

## 2020-04-09 DIAGNOSIS — Q2112 Patent foramen ovale: Secondary | ICD-10-CM

## 2020-04-09 NOTE — H&P (View-Only) (Signed)
Cardiology Office Note:    Date:  04/12/2020   ID:  Tara [name], DOB 03-24-1966, MRN [id number]  PCP:  [name], MD  [facility] Cardiologist:  No primary care provider on file.  [facility] Electrophysiologist:  None   Referring MD: [name], MD   Chief Complaint  Patient presents with  . PFO Consult    History of Present Illness:    Tara [name] is a 54 y.o. female with a hx of cryptogenic stroke in March 2021, referred by [name] for consideration of PFO closure. She had a prior history of migraine headache, and presented with dysarthria, left arm numbness and weakness, and dysphagia. She was found to have 2 right parietal infarcts suggesting an embolic etiology. Hypercoagulable workup was negative and CTA studies showed no large vessel disease. The patient underwent loop recorder implantation and this has shown no evidence of atrial fibrillation to date. She underwent a transcranial doppler study that was positive for moderate to large right to left shunt. A TEE showed normal LV function, no significant valvular disease, and the presence of a PFO with strongly positive bubble study.   The patient is here with her husband today. She has had migraine headaches since the 1990's. However, she has no prior history of stroke or TIA. She has no history of CAD or symptoms of ischemic heart disease. She does a lot of walking and has no symptoms of chest pain, chest pressure, or shortness of breath with activity. She has made significant improvements since her stroke, but still has some problems with word finding at times. Also reports difficulty with focus and concentration as well as some residual weakness and numbness in the left arm. She has had extensive physical, occupational, and speech therapy with significant improvement in her symptoms. She has no clear history of nickel allergy, but states she had some irritation around her earlobes with some jewelry  when she was a child.   Past Medical History:  Diagnosis Date  . Asthma   . Hyperlipidemia   . Migraine   . Rotator cuff tear   . Spondylolisthesis    L4-5    Past Surgical History:  Procedure Laterality Date  . BUBBLE STUDY  07/30/2019   Procedure: BUBBLE STUDY;  Surgeon: [name], MD;  Location: [facility] ENDOSCOPY;  Service: Cardiovascular;;  . LOOP RECORDER INSERTION N/A 07/30/2019   Procedure: LOOP RECORDER INSERTION;  Surgeon: [name], MD;  Location: [facility] INVASIVE CV LAB;  Service: Cardiovascular;  Laterality: N/A;  . No prior surgery    . TEE WITHOUT CARDIOVERSION N/A 07/30/2019   Procedure: TRANSESOPHAGEAL ECHOCARDIOGRAM (TEE);  Surgeon: [name], MD;  Location: [facility] ENDOSCOPY;  Service: Cardiovascular;  Laterality: N/A;    Current Medications: Current Meds  Medication Sig  . aspirin EC 81 MG EC tablet Take 1 tablet (81 mg total) by mouth daily.  . cholecalciferol (VITAMIN D) 1000 UNITS tablet Take 1,000 Units by mouth daily.  . Coenzyme Q10 (COQ10 PO) Take by mouth.  . Omega-3 Fatty Acids (FISH OIL) 500 MG CAPS Natures made Fish oil pearls 500mg  BID  . Probiotic Product (PROBIOTIC PO) Take 1 capsule by mouth daily.   . rizatriptan (MAXALT) 10 MG tablet Take 10 mg by mouth as needed for migraine (headache).   . rosuvastatin (CRESTOR) 20 MG tablet Take 1 tablet (20 mg total) by mouth daily.  scopolamine (TRANSDERM-SCOP) 1 MG/3DAYS Place 1 patch onto the skin as needed.  Allergies:   Tape   Social History   Socioeconomic History  . Marital status: Married    Spouse name: Not on file  . Number of children: 2  . Years of education: BSN  . Highest education level: Not on file  Occupational History  . Occupation: [place]  Tobacco Use  . Smoking status: Never Smoker  . Smokeless tobacco: Never Used  Substance and Sexual Activity  . Alcohol use: No    Alcohol/week: 0.0 standard drinks    Comment: Rare  . Drug use: No  . Sexual  activity: Yes    Birth control/protection: None  Other Topics Concern  . Not on file  Social History Narrative   Lives at home w/ her husband and son   Right-handed   Caffeine: seldom, soda on the weekend   Social Determinants of Health   Financial Resource Strain:   . Difficulty of Paying Living Expenses: Not on file  Food Insecurity:   . Worried About Programme researcher, broadcasting/film/video in the Last Year: Not on file  . Ran Out of Food in the Last Year: Not on file  Transportation Needs:   . Lack of Transportation (Medical): Not on file  . Lack of Transportation (Non-Medical): Not on file  Physical Activity:   . Days of Exercise per Week: Not on file  . Minutes of Exercise per Session: Not on file  Stress:   . Feeling of Stress : Not on file  Social Connections:   . Frequency of Communication with Friends and Family: Not on file  . Frequency of Social Gatherings with Friends and Family: Not on file  . Attends Religious Services: Not on file  . Active Member of Clubs or Organizations: Not on file  . Attends Banker Meetings: Not on file  . Marital Status: Not on file     Family History: The patient's family history includes Heart disease (age of onset: 106) in her father; Hyperlipidemia in her brother; Hypertension in her maternal grandmother.  ROS:   Please see the history of present illness.    All other systems reviewed and are negative.  EKGs/Labs/Other Studies Reviewed:    The following studies were reviewed today: Echo 07/29/2019: IMPRESSIONS    1. Left ventricular ejection fraction, by estimation, is 60 to 65%. The  left ventricle has normal function. The left ventricle has no regional  wall motion abnormalities. There is mild concentric left ventricular  hypertrophy. Left ventricular diastolic  parameters were normal.  2. Right ventricular systolic function is normal. The right ventricular  size is normal.  3. The mitral valve is normal in structure and  function. No evidence of  mitral valve regurgitation. No evidence of mitral stenosis.  4. The aortic valve is normal in structure and function. Aortic valve  regurgitation is not visualized. No aortic stenosis is present.  5. The inferior vena cava is normal in size with greater than 50%  respiratory variability, suggesting right atrial pressure of 3 mmHg.   Comparison(s): Prior images unable to be directly viewed, comparison made  by report only.   TEE 07/30/2019: IMPRESSIONS    1. Left ventricular ejection fraction, by estimation, is 60 to 65%. The  left ventricle has normal function. The left ventricle has no regional  wall motion abnormalities.  2. Right ventricular systolic function is normal. The right ventricular  size is normal.  3. No left atrial/left atrial appendage thrombus was detected.  4. The mitral valve is normal  in structure and function. Trivial mitral  valve regurgitation.  5. The aortic valve is tricuspid. Aortic valve regurgitation is not  visualized.  6. Positive bubble study. Agitated saline contrast bubble study was  positive with shunting observed within 3-6 cardiac cycles consistent with  PFO   TCD bubble study 07/30/2019: Some HITS (high intensity transient signals) were heard at rest suggestive  of a small PFO. With the Valsalva maneuver, a frank curtain sign was  noted, which suggests a medium to large PFO.  Positive TCD Bubble study indicative of a large right to left shunt.  CTA Head and Neck 07/29/2019: IMPRESSION: 1. No emergent large vessel occlusion. 2. Normal CT perfusion of the head. 3. Mild distal small vessel irregularity in both the anterior and posterior circulation suggesting intracranial atherosclerotic change without a proximal stenosis, aneurysm or branch vessel occlusion. 4. No significant stenosis in the neck.  EKG:  EKG is ordered today.  The ekg ordered today demonstrates normal sinus rhythm 69 bpm, within normal  limits.  Recent Labs: 07/29/2019: ALT 27 07/30/2019: BUN 14; Creatinine, Ser 0.84; Hemoglobin 12.5; Platelets 353; Potassium 4.0; Sodium 140; TSH 1.831  Recent Lipid Panel    Component Value Date/Time   CHOL 241 (H) 07/29/2019 1132   TRIG 193 (H) 07/29/2019 1132   HDL 51 07/29/2019 1132   CHOLHDL 4.7 07/29/2019 1132   VLDL 39 07/29/2019 1132   LDLCALC 151 (H) 07/29/2019 1132     Risk Assessment/Calculations:       Physical Exam:    VS:  BP 116/80   Pulse 69   Ht 5\' 4"  (1.626 m)   Wt 160 lb 9.6 oz (72.8 kg)   LMP 11/24/2013   SpO2 94%   BMI 27.57 kg/m     Wt Readings from Last 3 Encounters:  04/09/20 160 lb 9.6 oz (72.8 kg)  04/01/20 158 lb (71.7 kg)  03/01/20 160 lb (72.6 kg)     GEN:  Well nourished, well developed in no acute distress HEENT: Normal NECK: No JVD; No carotid bruits LYMPHATICS: No lymphadenopathy CARDIAC: RRR, no murmurs, rubs, gallops RESPIRATORY:  Clear to auscultation without rales, wheezing or rhonchi  ABDOMEN: Soft, non-tender, non-distended MUSCULOSKELETAL:  No edema; No deformity  SKIN: Warm and dry NEUROLOGIC:  Alert and oriented x 3 PSYCHIATRIC:  Normal affect   ASSESSMENT:    1. PFO (patent foramen ovale)    PLAN:    In order of problems listed above:  1. The patient presented in March 2021 with cryptogenic stroke and radiographic evidence of a suspected cardioembolic event.  Hospital records and imaging studies are personally reviewed.  There was no evidence of large vessel atherosclerotic disease.  Hypercoagulability panel was negative.  There has been no evidence of atrial fibrillation on implantable loop recorder interrogations.  She has been found to have a moderate to large PFO based on both her transcranial Doppler and transesophageal echo studies.  I have personally reviewed the images of her transesophageal echo study.  She has early right to left shunting based on agitated saline where bubbles can be demonstrated crossing  through the PFO into the left atrium.  There are a large number of bubbles crossing suggesting a moderate to large PFO.  I reviewed clinical trial data with the patient and her husband today, with specific discussion regarding secondary stroke prevention with PFO closure.  I personally reviewed the potential risks of the procedure and demonstrated procedural steps with the patient.  The Amplatzer PFO device is  demonstrated to them today.  Procedural risks we discussed include a low risk of bleeding, infection, vascular injury, stroke, heart attack, cardiac injury, hemopericardium, device embolization, late device erosion, cardiac arrhythmia, emergency cardiac surgery, and death.  They understand that any of these serious risks occur at a low incidence.  We discussed the need for 6 months of dual antiplatelet therapy with aspirin and clopidogrel following PFO closure.  After our discussion today, the patient and husband have decided they would like to proceed.  This will be scheduled at our next available time.    Shared Decision Making/Informed Consent      Medication Adjustments/Labs and Tests Ordered: Current medicines are reviewed at length with the patient today.  Concerns regarding medicines are outlined above.  Orders Placed This Encounter  Procedures  . EKG 12-Lead   No orders of the defined types were placed in this encounter.   Patient Instructions  Medication Instructions (11/17):  1) START PLAVIX 75 mg daily 11/17 *If you need a refill on your cardiac medications before your next appointment, please call your pharmacy*   COVID SCREENING INFORMATION (11/20): You are scheduled for your drive-thru COVID screening on 04/17/20 between 9AM and 11AM. Pre-Procedural COVID-19 Testing Site [address] [place], [place] [address] You will need to go home after your screening and quarantine until your procedure.    PFO CLOSURE INSTRUCTIONS (11/22): You are scheduled for a PFO CLOSURE  on Monday, November 22 with Dr. Cale Decarolis.  1. Please arrive at the [facility] (Main Entrance A) at [facility]: [address] , [place] [address] at 7:00 AM (This time is two hours before your procedure to ensure your preparation). Free valet parking service is available. You are allowed ONE visitor in the waiting room during your procedure. Both you and your guest must wear masks. Special note: Every effort is made to have your procedure done on time. Please understand that emergencies sometimes delay scheduled procedures.  2. Diet: Stay well hydrated the day before your procedure! Do not eat solid foods after midnight.  You may have clear liquids until 5am upon the day of the procedure.  3. Labs: You will have labs drawn when you decide to proceed with procedure.  4. Medication instructions in preparation for your procedure:  1) MAKE SURE TO TAKE YOUR ASPIRIN the morning of your closure  2) MAKE SURE TO TAKE YOUR PLAVIX the morning of your closure  3) You may take your other medications as directed with sips of water   5. Plan for one night stay--bring personal belongings. 6. Bring a current list of your medications and current insurance cards. 7. You MUST have a responsible person to drive you home. 8. Someone MUST be with you the first 24 hours after you arrive home or your discharge will be delayed. 9. Please wear clothes that are easy to get on and off and wear slip-on shoes.   FOLLOW-UP VISIT (TBD): You will be scheduled for a follow-up visit 30 days after your closure.    Signed, Anay Walter, MD  04/12/2020 5:36 PM    [facility] 

## 2020-04-09 NOTE — Patient Instructions (Addendum)
Medication Instructions (11/17):  1) START PLAVIX 75 mg daily 11/17 *If you need a refill on your cardiac medications before your next appointment, please call your pharmacy*   COVID SCREENING INFORMATION (11/20): You are scheduled for your drive-thru COVID screening on 04/17/20 between 9AM and 11AM. Pre-Procedural COVID-19 Testing Site [address] [place], [place] [address] You will need to go home after your screening and quarantine until your procedure.    PFO CLOSURE INSTRUCTIONS (11/22): You are scheduled for a PFO CLOSURE on Monday, November 22 with Dr. [name].  1. Please arrive at the [facility] (Main Entrance A) at [facility]: [address] [place], [place] [address] at 7:00 AM (This time is two hours before your procedure to ensure your preparation). Free valet parking service is available. You are allowed ONE visitor in the waiting room during your procedure. Both you and your guest must wear masks. Special note: Every effort is made to have your procedure done on time. Please understand that emergencies sometimes delay scheduled procedures.  2. Diet: Stay well hydrated the day before your procedure! Do not eat solid foods after midnight.  You may have clear liquids until 5am upon the day of the procedure.  3. Labs: You will have labs drawn when you decide to proceed with procedure.  4. Medication instructions in preparation for your procedure:  1) MAKE SURE TO TAKE YOUR ASPIRIN the morning of your closure  2) MAKE SURE TO TAKE YOUR PLAVIX the morning of your closure  3) You may take your other medications as directed with sips of water   5. Plan for one night stay--bring personal belongings. 6. Bring a current list of your medications and current insurance cards. 7. You MUST have a responsible person to drive you home. 8. Someone MUST be with you the first 24 hours after you arrive home or your discharge will be delayed. 9. Please wear clothes that are  easy to get on and off and wear slip-on shoes.   FOLLOW-UP VISIT (TBD): You will be scheduled for a follow-up visit 30 days after your closure.

## 2020-04-09 NOTE — Progress Notes (Signed)
Cardiology Office Note:    Date:  04/12/2020   ID:  Tara [name], DOB 03-24-1966, MRN [id number]  PCP:  [name], MD  [facility] Cardiologist:  No primary care provider on file.  [facility] Electrophysiologist:  None   Referring MD: [name], MD   Chief Complaint  Patient presents with  . PFO Consult    History of Present Illness:    Tara [name] is a 54 y.o. female with a hx of cryptogenic stroke in March 2021, referred by [name] for consideration of PFO closure. She had a prior history of migraine headache, and presented with dysarthria, left arm numbness and weakness, and dysphagia. She was found to have 2 right parietal infarcts suggesting an embolic etiology. Hypercoagulable workup was negative and CTA studies showed no large vessel disease. The patient underwent loop recorder implantation and this has shown no evidence of atrial fibrillation to date. She underwent a transcranial doppler study that was positive for moderate to large right to left shunt. A TEE showed normal LV function, no significant valvular disease, and the presence of a PFO with strongly positive bubble study.   The patient is here with her husband today. She has had migraine headaches since the 1990's. However, she has no prior history of stroke or TIA. She has no history of CAD or symptoms of ischemic heart disease. She does a lot of walking and has no symptoms of chest pain, chest pressure, or shortness of breath with activity. She has made significant improvements since her stroke, but still has some problems with word finding at times. Also reports difficulty with focus and concentration as well as some residual weakness and numbness in the left arm. She has had extensive physical, occupational, and speech therapy with significant improvement in her symptoms. She has no clear history of nickel allergy, but states she had some irritation around her earlobes with some jewelry  when she was a child.   Past Medical History:  Diagnosis Date  . Asthma   . Hyperlipidemia   . Migraine   . Rotator cuff tear   . Spondylolisthesis    L4-5    Past Surgical History:  Procedure Laterality Date  . BUBBLE STUDY  07/30/2019   Procedure: BUBBLE STUDY;  Surgeon: [name], MD;  Location: [facility] ENDOSCOPY;  Service: Cardiovascular;;  . LOOP RECORDER INSERTION N/A 07/30/2019   Procedure: LOOP RECORDER INSERTION;  Surgeon: [name], MD;  Location: [facility] INVASIVE CV LAB;  Service: Cardiovascular;  Laterality: N/A;  . No prior surgery    . TEE WITHOUT CARDIOVERSION N/A 07/30/2019   Procedure: TRANSESOPHAGEAL ECHOCARDIOGRAM (TEE);  Surgeon: [name], MD;  Location: [facility] ENDOSCOPY;  Service: Cardiovascular;  Laterality: N/A;    Current Medications: Current Meds  Medication Sig  . aspirin EC 81 MG EC tablet Take 1 tablet (81 mg total) by mouth daily.  . cholecalciferol (VITAMIN D) 1000 UNITS tablet Take 1,000 Units by mouth daily.  . Coenzyme Q10 (COQ10 PO) Take by mouth.  . Omega-3 Fatty Acids (FISH OIL) 500 MG CAPS Natures made Fish oil pearls 500mg  BID  . Probiotic Product (PROBIOTIC PO) Take 1 capsule by mouth daily.   . rizatriptan (MAXALT) 10 MG tablet Take 10 mg by mouth as needed for migraine (headache).   . rosuvastatin (CRESTOR) 20 MG tablet Take 1 tablet (20 mg total) by mouth daily.  scopolamine (TRANSDERM-SCOP) 1 MG/3DAYS Place 1 patch onto the skin as needed.  Allergies:   Tape   Social History   Socioeconomic History  . Marital status: Married    Spouse name: Not on file  . Number of children: 2  . Years of education: BSN  . Highest education level: Not on file  Occupational History  . Occupation: [place]  Tobacco Use  . Smoking status: Never Smoker  . Smokeless tobacco: Never Used  Substance and Sexual Activity  . Alcohol use: No    Alcohol/week: 0.0 standard drinks    Comment: Rare  . Drug use: No  . Sexual  activity: Yes    Birth control/protection: None  Other Topics Concern  . Not on file  Social History Narrative   Lives at home w/ her husband and son   Right-handed   Caffeine: seldom, soda on the weekend   Social Determinants of Health   Financial Resource Strain:   . Difficulty of Paying Living Expenses: Not on file  Food Insecurity:   . Worried About Programme researcher, broadcasting/film/video in the Last Year: Not on file  . Ran Out of Food in the Last Year: Not on file  Transportation Needs:   . Lack of Transportation (Medical): Not on file  . Lack of Transportation (Non-Medical): Not on file  Physical Activity:   . Days of Exercise per Week: Not on file  . Minutes of Exercise per Session: Not on file  Stress:   . Feeling of Stress : Not on file  Social Connections:   . Frequency of Communication with Friends and Family: Not on file  . Frequency of Social Gatherings with Friends and Family: Not on file  . Attends Religious Services: Not on file  . Active Member of Clubs or Organizations: Not on file  . Attends Banker Meetings: Not on file  . Marital Status: Not on file     Family History: The patient's family history includes Heart disease (age of onset: 106) in her father; Hyperlipidemia in her brother; Hypertension in her maternal grandmother.  ROS:   Please see the history of present illness.    All other systems reviewed and are negative.  EKGs/Labs/Other Studies Reviewed:    The following studies were reviewed today: Echo 07/29/2019: IMPRESSIONS    1. Left ventricular ejection fraction, by estimation, is 60 to 65%. The  left ventricle has normal function. The left ventricle has no regional  wall motion abnormalities. There is mild concentric left ventricular  hypertrophy. Left ventricular diastolic  parameters were normal.  2. Right ventricular systolic function is normal. The right ventricular  size is normal.  3. The mitral valve is normal in structure and  function. No evidence of  mitral valve regurgitation. No evidence of mitral stenosis.  4. The aortic valve is normal in structure and function. Aortic valve  regurgitation is not visualized. No aortic stenosis is present.  5. The inferior vena cava is normal in size with greater than 50%  respiratory variability, suggesting right atrial pressure of 3 mmHg.   Comparison(s): Prior images unable to be directly viewed, comparison made  by report only.   TEE 07/30/2019: IMPRESSIONS    1. Left ventricular ejection fraction, by estimation, is 60 to 65%. The  left ventricle has normal function. The left ventricle has no regional  wall motion abnormalities.  2. Right ventricular systolic function is normal. The right ventricular  size is normal.  3. No left atrial/left atrial appendage thrombus was detected.  4. The mitral valve is normal  in structure and function. Trivial mitral  valve regurgitation.  5. The aortic valve is tricuspid. Aortic valve regurgitation is not  visualized.  6. Positive bubble study. Agitated saline contrast bubble study was  positive with shunting observed within 3-6 cardiac cycles consistent with  PFO   TCD bubble study 07/30/2019: Some HITS (high intensity transient signals) were heard at rest suggestive  of a small PFO. With the Valsalva maneuver, a frank curtain sign was  noted, which suggests a medium to large PFO.  Positive TCD Bubble study indicative of a large right to left shunt.  CTA Head and Neck 07/29/2019: IMPRESSION: 1. No emergent large vessel occlusion. 2. Normal CT perfusion of the head. 3. Mild distal small vessel irregularity in both the anterior and posterior circulation suggesting intracranial atherosclerotic change without a proximal stenosis, aneurysm or branch vessel occlusion. 4. No significant stenosis in the neck.  EKG:  EKG is ordered today.  The ekg ordered today demonstrates normal sinus rhythm 69 bpm, within normal  limits.  Recent Labs: 07/29/2019: ALT 27 07/30/2019: BUN 14; Creatinine, Ser 0.84; Hemoglobin 12.5; Platelets 353; Potassium 4.0; Sodium 140; TSH 1.831  Recent Lipid Panel    Component Value Date/Time   CHOL 241 (H) 07/29/2019 1132   TRIG 193 (H) 07/29/2019 1132   HDL 51 07/29/2019 1132   CHOLHDL 4.7 07/29/2019 1132   VLDL 39 07/29/2019 1132   LDLCALC 151 (H) 07/29/2019 1132     Risk Assessment/Calculations:       Physical Exam:    VS:  BP 116/80   Pulse 69   Ht 5\' 4"  (1.626 m)   Wt 160 lb 9.6 oz (72.8 kg)   LMP 11/24/2013   SpO2 94%   BMI 27.57 kg/m     Wt Readings from Last 3 Encounters:  04/09/20 160 lb 9.6 oz (72.8 kg)  04/01/20 158 lb (71.7 kg)  03/01/20 160 lb (72.6 kg)     GEN:  Well nourished, well developed in no acute distress HEENT: Normal NECK: No JVD; No carotid bruits LYMPHATICS: No lymphadenopathy CARDIAC: RRR, no murmurs, rubs, gallops RESPIRATORY:  Clear to auscultation without rales, wheezing or rhonchi  ABDOMEN: Soft, non-tender, non-distended MUSCULOSKELETAL:  No edema; No deformity  SKIN: Warm and dry NEUROLOGIC:  Alert and oriented x 3 PSYCHIATRIC:  Normal affect   ASSESSMENT:    1. PFO (patent foramen ovale)    PLAN:    In order of problems listed above:  1. The patient presented in March 2021 with cryptogenic stroke and radiographic evidence of a suspected cardioembolic event.  Hospital records and imaging studies are personally reviewed.  There was no evidence of large vessel atherosclerotic disease.  Hypercoagulability panel was negative.  There has been no evidence of atrial fibrillation on implantable loop recorder interrogations.  She has been found to have a moderate to large PFO based on both her transcranial Doppler and transesophageal echo studies.  I have personally reviewed the images of her transesophageal echo study.  She has early right to left shunting based on agitated saline where bubbles can be demonstrated crossing  through the PFO into the left atrium.  There are a large number of bubbles crossing suggesting a moderate to large PFO.  I reviewed clinical trial data with the patient and her husband today, with specific discussion regarding secondary stroke prevention with PFO closure.  I personally reviewed the potential risks of the procedure and demonstrated procedural steps with the patient.  The Amplatzer PFO device is  demonstrated to them today.  Procedural risks we discussed include a low risk of bleeding, infection, vascular injury, stroke, heart attack, cardiac injury, hemopericardium, device embolization, late device erosion, cardiac arrhythmia, emergency cardiac surgery, and death.  They understand that any of these serious risks occur at a low incidence.  We discussed the need for 6 months of dual antiplatelet therapy with aspirin and clopidogrel following PFO closure.  After our discussion today, the patient and husband have decided they would like to proceed.  This will be scheduled at our next available time.    Shared Decision Making/Informed Consent      Medication Adjustments/Labs and Tests Ordered: Current medicines are reviewed at length with the patient today.  Concerns regarding medicines are outlined above.  Orders Placed This Encounter  Procedures  . EKG 12-Lead   No orders of the defined types were placed in this encounter.   Patient Instructions  Medication Instructions (11/17):  1) START PLAVIX 75 mg daily 11/17 *If you need a refill on your cardiac medications before your next appointment, please call your pharmacy*   COVID SCREENING INFORMATION (11/20): You are scheduled for your drive-thru COVID screening on 04/17/20 between 9AM and 11AM. Pre-Procedural COVID-19 Testing Site [address] [place], [place] [id number] You will need to go home after your screening and quarantine until your procedure.    PFO CLOSURE INSTRUCTIONS (11/22): You are scheduled for a PFO CLOSURE  on Monday, November 22 with Dr. Tonny BollmanMichael Sherly Brodbeck.  1. Please arrive at the [facility] (Main Entrance A) at [facility]: [address] [place], [place] [address] at 7:00 AM (This time is two hours before your procedure to ensure your preparation). Free valet parking service is available. You are allowed ONE visitor in the waiting room during your procedure. Both you and your guest must wear masks. Special note: Every effort is made to have your procedure done on time. Please understand that emergencies sometimes delay scheduled procedures.  2. Diet: Stay well hydrated the day before your procedure! Do not eat solid foods after midnight.  You may have clear liquids until 5am upon the day of the procedure.  3. Labs: You will have labs drawn when you decide to proceed with procedure.  4. Medication instructions in preparation for your procedure:  1) MAKE SURE TO TAKE YOUR ASPIRIN the morning of your closure  2) MAKE SURE TO TAKE YOUR PLAVIX the morning of your closure  3) You may take your other medications as directed with sips of water   5. Plan for one night stay--bring personal belongings. 6. Bring a current list of your medications and current insurance cards. 7. You MUST have a responsible person to drive you home. 8. Someone MUST be with you the first 24 hours after you arrive home or your discharge will be delayed. 9. Please wear clothes that are easy to get on and off and wear slip-on shoes.   FOLLOW-UP VISIT (TBD): You will be scheduled for a follow-up visit 30 days after your closure.    Signed, Tonny BollmanMichael Sanaiya Welliver, MD  04/12/2020 5:36 PM    [facility]

## 2020-04-12 ENCOUNTER — Telehealth: Payer: Self-pay | Admitting: Cardiovascular Disease

## 2020-04-12 ENCOUNTER — Ambulatory Visit (INDEPENDENT_AMBULATORY_CARE_PROVIDER_SITE_OTHER): Payer: 59 | Admitting: Neurology

## 2020-04-12 ENCOUNTER — Telehealth: Payer: Self-pay | Admitting: *Deleted

## 2020-04-12 DIAGNOSIS — G43109 Migraine with aura, not intractable, without status migrainosus: Secondary | ICD-10-CM

## 2020-04-12 DIAGNOSIS — Q2112 Patent foramen ovale: Secondary | ICD-10-CM

## 2020-04-12 DIAGNOSIS — Q211 Atrial septal defect: Secondary | ICD-10-CM

## 2020-04-12 DIAGNOSIS — I5189 Other ill-defined heart diseases: Secondary | ICD-10-CM

## 2020-04-12 DIAGNOSIS — I63411 Cerebral infarction due to embolism of right middle cerebral artery: Secondary | ICD-10-CM

## 2020-04-12 DIAGNOSIS — G4733 Obstructive sleep apnea (adult) (pediatric): Secondary | ICD-10-CM | POA: Diagnosis not present

## 2020-04-12 DIAGNOSIS — R0683 Snoring: Secondary | ICD-10-CM

## 2020-04-12 DIAGNOSIS — J4540 Moderate persistent asthma, uncomplicated: Secondary | ICD-10-CM

## 2020-04-12 DIAGNOSIS — G4710 Hypersomnia, unspecified: Secondary | ICD-10-CM

## 2020-04-12 NOTE — Telephone Encounter (Signed)
The patient reports she has not talked to Billing. Reiterated to her to call her insurance directly (CPT code was given to her at visit Friday). She was given the hospital Billing department as well. She will call her insurance and hospital billing department tomorrow and will be called to confirm plans Wednesday morning. She was grateful for assistance.

## 2020-04-12 NOTE — Telephone Encounter (Signed)
Patient calling about billing questions for her PFO closure on 11/22. Spoke with Pam in billing who states that we do not bill that but [facility] will be the one billing it. Gave the patient [facility] Patient Account Billing number. She is now requesting to speak with Dr. Earmon Phoenix nurse.

## 2020-04-12 NOTE — Telephone Encounter (Signed)
CT - [facility] report attached to [phone], [phone] received fax confirmation that received and appt schedled with Dr. [name] 06-02-2020 0900.

## 2020-04-13 NOTE — Telephone Encounter (Signed)
Mailed this to pt as well.

## 2020-04-14 MED ORDER — CLOPIDOGREL BISULFATE 75 MG PO TABS
75.0000 mg | ORAL_TABLET | Freq: Every day | ORAL | 1 refills | Status: DC
Start: 2020-04-14 — End: 2020-04-15

## 2020-04-14 NOTE — Telephone Encounter (Signed)
Confirmed with the patient she will proceed with closure 11/22. She will START PLAVIX 75 mg daily tomorrow - called in to requested pharmacy. Pre-procedure labs and Covid screen scheduled 11/19 AM. 1 month follow-up visit scheduled 05/26/20 with Carlean Jews. The patient was grateful for call and agrees with plan.

## 2020-04-14 NOTE — Telephone Encounter (Signed)
Patient returning phone call, will await a call back .  °

## 2020-04-14 NOTE — Telephone Encounter (Signed)
Left message to call back  

## 2020-04-15 ENCOUNTER — Other Ambulatory Visit: Payer: Self-pay | Admitting: Medical

## 2020-04-15 ENCOUNTER — Telehealth: Payer: Self-pay | Admitting: *Deleted

## 2020-04-15 MED ORDER — CLOPIDOGREL BISULFATE 75 MG PO TABS: 75.0000 mg | ORAL_TABLET | Freq: Every day | ORAL | 1 refills | Status: DC

## 2020-04-15 NOTE — Telephone Encounter (Signed)
Pt contacted pre-PFO closure scheduled at [facility] for: Monday April 19, 2020 9 AM Verified arrival time and place: [facility] Main Entrance A [facility]) at: 7 AM   No solid food after midnight prior to cath, clear liquids until 5 AM day of procedure.  AM meds can be  taken pre-cath with sips of water including: ASA 81 mg Plavix 75 mg  Confirmed patient has responsible adult to drive home post procedure and be with patient first 24 hours after arriving home: yes  You are allowed ONE visitor in the waiting room during the time you are at the hospital for your procedure. Both you and your visitor must wear a mask once you enter the hospital.       COVID-19 Pre-Screening Questions:  . In the past 14 days have you had any symptoms concerning for COVID-19 infection (fever, chills, cough, or new shortness of breath)? no . In the past 14 days have you been around anyone with known Covid 19? no                 Reviewed procedure/mask/visitor instructions, COVID-19 questions with patient.

## 2020-04-16 ENCOUNTER — Other Ambulatory Visit: Payer: 59 | Admitting: *Deleted

## 2020-04-16 ENCOUNTER — Other Ambulatory Visit (HOSPITAL_COMMUNITY)
Admission: RE | Admit: 2020-04-16 | Discharge: 2020-04-16 | Disposition: A | Discharge status: Home / Self Care | Payer: 59 | Source: Ambulatory Visit | Attending: Cardiovascular Disease | Admitting: Cardiovascular Disease

## 2020-04-16 ENCOUNTER — Other Ambulatory Visit: Payer: Self-pay

## 2020-04-16 DIAGNOSIS — Z20822 Contact with and (suspected) exposure to covid-19: Secondary | ICD-10-CM | POA: Insufficient documentation

## 2020-04-16 DIAGNOSIS — Q2112 Patent foramen ovale: Secondary | ICD-10-CM

## 2020-04-16 DIAGNOSIS — Z01812 Encounter for preprocedural laboratory examination: Secondary | ICD-10-CM | POA: Insufficient documentation

## 2020-04-16 DIAGNOSIS — Q211 Atrial septal defect: Secondary | ICD-10-CM

## 2020-04-16 LAB — BASIC METABOLIC PANEL
BUN/Creatinine Ratio: 12 (ref 9–23)
BUN: 10 mg/dL (ref 6–24)
CO2: 27 mmol/L (ref 20–29)
Calcium: 9.4 mg/dL (ref 8.7–10.2)
Chloride: 105 mmol/L (ref 96–106)
Creatinine, Ser: 0.85 mg/dL (ref 0.57–1.00)
GFR calc Af Amer: 91 mL/min/{1.73_m2} (ref >59)
GFR calc non Af Amer: 79 mL/min/{1.73_m2} (ref >59)
Glucose: 98 mg/dL (ref 65–99)
Potassium: 4.7 mmol/L (ref 3.5–5.2)
Sodium: 143 mmol/L (ref 134–144)

## 2020-04-16 LAB — CBC WITH DIFFERENTIAL/PLATELET
Basophils Absolute: 0 10*3/uL (ref 0.0–0.2)
Basos: 1 %
EOS (ABSOLUTE): 0.5 10*3/uL — ABNORMAL HIGH (ref 0.0–0.4)
Eos: 10 %
Hematocrit: 37.6 % (ref 34.0–46.6)
Hemoglobin: 12.6 g/dL (ref 11.1–15.9)
Immature Grans (Abs): 0 10*3/uL (ref 0.0–0.1)
Immature Granulocytes: 0 %
Lymphocytes Absolute: 1.8 10*3/uL (ref 0.7–3.1)
Lymphs: 32 %
MCH: 31.3 pg (ref 26.6–33.0)
MCHC: 33.5 g/dL (ref 31.5–35.7)
MCV: 94 fL (ref 79–97)
Monocytes Absolute: 0.4 10*3/uL (ref 0.1–0.9)
Monocytes: 7 %
Neutrophils Absolute: 2.8 10*3/uL (ref 1.4–7.0)
Neutrophils: 50 %
Platelets: 390 10*3/uL (ref 150–450)
RBC: 4.02 x10E6/uL (ref 3.77–5.28)
RDW: 11.9 % (ref 11.7–15.4)
WBC: 5.6 10*3/uL (ref 3.4–10.8)

## 2020-04-16 LAB — SARS CORONAVIRUS 2 (TAT 6-24 HRS): SARS Coronavirus 2: NEGATIVE

## 2020-04-16 MED FILL — CLOPIDOGREL 75 MG TABLET: 75 | 90 days supply | Qty: 90 | Fill #0

## 2020-04-19 ENCOUNTER — Ambulatory Visit (HOSPITAL_COMMUNITY)
Admission: RE | Admit: 2020-04-19 | Discharge: 2020-04-19 | Disposition: A | Discharge status: Home / Self Care | Payer: 59 | Attending: Cardiovascular Disease | Admitting: Cardiovascular Disease

## 2020-04-19 ENCOUNTER — Ambulatory Visit (HOSPITAL_BASED_OUTPATIENT_CLINIC_OR_DEPARTMENT_OTHER): Payer: 59

## 2020-04-19 ENCOUNTER — Encounter (HOSPITAL_COMMUNITY)
Admission: RE | Disposition: A | Discharge status: Home / Self Care | Payer: Self-pay | Source: Home / Self Care | Attending: Cardiovascular Disease

## 2020-04-19 DIAGNOSIS — Z7982 Long term (current) use of aspirin: Secondary | ICD-10-CM | POA: Insufficient documentation

## 2020-04-19 DIAGNOSIS — Q2112 Patent foramen ovale: Secondary | ICD-10-CM

## 2020-04-19 DIAGNOSIS — Z8673 Personal history of transient ischemic attack (TIA), and cerebral infarction without residual deficits: Secondary | ICD-10-CM | POA: Diagnosis not present

## 2020-04-19 DIAGNOSIS — Z79899 Other long term (current) drug therapy: Secondary | ICD-10-CM | POA: Insufficient documentation

## 2020-04-19 DIAGNOSIS — Q211 Atrial septal defect: Secondary | ICD-10-CM

## 2020-04-19 HISTORY — PX: PATENT FORAMEN OVALE(PFO) CLOSURE: CATH118300

## 2020-04-19 LAB — POCT ACTIVATED CLOTTING TIME
Activated Clotting Time: 208 seconds
Activated Clotting Time: 197 seconds

## 2020-04-19 LAB — ECHOCARDIOGRAM LIMITED
Area-P 1/2: 2.66 cm2
Height: 64 in
Weight: 2544 oz

## 2020-04-19 SURGERY — PATENT FORAMEN OVALE (PFO) CLOSURE
Anesthesia: LOCAL

## 2020-04-19 MED ORDER — SODIUM CHLORIDE 0.9 % WEIGHT BASED INFUSION: 1.0000 mL/kg/h | INTRAVENOUS | Status: DC

## 2020-04-19 MED ORDER — OXYCODONE HCL 5 MG PO TABS: 5.0000 mg | ORAL_TABLET | ORAL | Status: DC | PRN

## 2020-04-19 MED ORDER — SODIUM CHLORIDE 0.9% FLUSH: 3.0000 mL | Freq: Two times a day (BID) | INTRAVENOUS | Status: DC

## 2020-04-19 MED ORDER — ONDANSETRON HCL 4 MG/2ML IJ SOLN: 4.0000 mg | Freq: Four times a day (QID) | INTRAMUSCULAR | Status: DC | PRN

## 2020-04-19 MED ORDER — LIDOCAINE-EPINEPHRINE 1 %-1:100000 IJ SOLN
INTRAMUSCULAR | Status: AC
  Filled 2020-04-19: qty 1

## 2020-04-19 MED ORDER — FENTANYL CITRATE (PF) 100 MCG/2ML IJ SOLN
INTRAMUSCULAR | Status: AC
  Filled 2020-04-19: qty 2

## 2020-04-19 MED ORDER — ACETAMINOPHEN 325 MG PO TABS: 650.0000 mg | ORAL_TABLET | ORAL | Status: DC | PRN

## 2020-04-19 MED ORDER — SODIUM CHLORIDE 0.9 % IV SOLN
INTRAVENOUS | Status: DC | PRN
  Administered 2020-04-19: 999 mL/h via INTRAVENOUS

## 2020-04-19 MED ORDER — CLOPIDOGREL BISULFATE 75 MG PO TABS: 75.0000 mg | ORAL_TABLET | ORAL | Status: DC

## 2020-04-19 MED ORDER — HEPARIN (PORCINE) IN NACL 1000-0.9 UT/500ML-% IV SOLN
INTRAVENOUS | Status: AC
  Filled 2020-04-19: qty 500

## 2020-04-19 MED ORDER — CEFAZOLIN SODIUM-DEXTROSE 2-4 GM/100ML-% IV SOLN: 2.0000 g | INTRAVENOUS | Status: DC

## 2020-04-19 MED ORDER — MIDAZOLAM HCL 2 MG/2ML IJ SOLN
INTRAMUSCULAR | Status: AC
  Filled 2020-04-19: qty 2

## 2020-04-19 MED ORDER — SODIUM CHLORIDE 0.9 % IV SOLN: 250.0000 mL | INTRAVENOUS | Status: DC | PRN

## 2020-04-19 MED ORDER — FENTANYL CITRATE (PF) 100 MCG/2ML IJ SOLN
INTRAMUSCULAR | Status: DC | PRN
  Administered 2020-04-19: 25 ug via INTRAVENOUS

## 2020-04-19 MED ORDER — LIDOCAINE HCL (PF) 1 % IJ SOLN
INTRAMUSCULAR | Status: DC | PRN
  Administered 2020-04-19: 10 mL

## 2020-04-19 MED ORDER — HYDRALAZINE HCL 20 MG/ML IJ SOLN: 10.0000 mg | INTRAMUSCULAR | Status: AC | PRN

## 2020-04-19 MED ORDER — LIDOCAINE HCL (PF) 1 % IJ SOLN
INTRAMUSCULAR | Status: AC
  Filled 2020-04-19: qty 30

## 2020-04-19 MED ORDER — ASPIRIN 81 MG PO CHEW: 81.0000 mg | CHEWABLE_TABLET | ORAL | Status: DC

## 2020-04-19 MED ORDER — SODIUM CHLORIDE 0.9 % WEIGHT BASED INFUSION
3.0000 mL/kg/h | INTRAVENOUS | Status: AC
  Administered 2020-04-19: 3 mL/kg/h via INTRAVENOUS

## 2020-04-19 MED ORDER — HEPARIN SODIUM (PORCINE) 1000 UNIT/ML IJ SOLN
INTRAMUSCULAR | Status: DC | PRN
  Administered 2020-04-19: 2000 [IU] via INTRAVENOUS
  Administered 2020-04-19: 6000 [IU] via INTRAVENOUS

## 2020-04-19 MED ORDER — CEFAZOLIN SODIUM-DEXTROSE 2-4 GM/100ML-% IV SOLN
INTRAVENOUS | Status: AC
  Filled 2020-04-19: qty 100

## 2020-04-19 MED ORDER — LIDOCAINE-EPINEPHRINE 1 %-1:100000 IJ SOLN
30.0000 mL | Freq: Once | INTRAMUSCULAR | Status: AC
  Administered 2020-04-19: 8 mL

## 2020-04-19 MED ORDER — MIDAZOLAM HCL 2 MG/2ML IJ SOLN
INTRAMUSCULAR | Status: DC | PRN
  Administered 2020-04-19: 2 mg via INTRAVENOUS

## 2020-04-19 MED ORDER — SODIUM CHLORIDE 0.9% FLUSH: 3.0000 mL | INTRAVENOUS | Status: DC | PRN

## 2020-04-19 MED ORDER — LABETALOL HCL 5 MG/ML IV SOLN: 10.0000 mg | INTRAVENOUS | Status: AC | PRN

## 2020-04-19 MED ORDER — SODIUM CHLORIDE 0.9 % IV SOLN: INTRAVENOUS | Status: AC

## 2020-04-19 MED ORDER — CEFAZOLIN SODIUM-DEXTROSE 2-3 GM-%(50ML) IV SOLR
INTRAVENOUS | Status: AC | PRN
  Administered 2020-04-19: 2 g via INTRAVENOUS

## 2020-04-19 MED ORDER — HEPARIN (PORCINE) IN NACL 1000-0.9 UT/500ML-% IV SOLN
INTRAVENOUS | Status: DC | PRN
  Administered 2020-04-19: 500 mL

## 2020-04-19 MED ORDER — HEPARIN SODIUM (PORCINE) 1000 UNIT/ML IJ SOLN
INTRAMUSCULAR | Status: AC
  Filled 2020-04-19: qty 1

## 2020-04-19 SURGICAL SUPPLY — 18 items
CATH ACUNAV 8FR 90CM (CATHETERS) ×2 IMPLANT
CATH EXPO 5F MPA-1 (CATHETERS) IMPLANT
CATH INFINITI 6F MPA2 100CM (CATHETERS) ×2 IMPLANT
CLOSURE PERCLOSE PROSTYLE (VASCULAR PRODUCTS) ×4 IMPLANT
COVER SWIFTLINK CONNECTOR (BAG) ×2 IMPLANT
KIT HEART LEFT (KITS) ×2 IMPLANT
OCCLUDER AMPLATZER PFO 18MM (Prosthesis & Implant Heart) ×2 IMPLANT
PACK CARDIAC CATHETERIZATION (CUSTOM PROCEDURE TRAY) ×2 IMPLANT
PROTECTION STATION PRESSURIZED (MISCELLANEOUS) ×2
SHEATH INTROD W/O MIN 9FR 25CM (SHEATH) ×2 IMPLANT
SHEATH PINNACLE 8F 10CM (SHEATH) ×2 IMPLANT
STATION PROTECTION PRESSURIZED (MISCELLANEOUS) ×1 IMPLANT
SYS DELIVER AMP TREVISIO 8FR (SHEATH) ×2
SYSTEM DELIVER AMP TREVIS 8FR (SHEATH) ×1 IMPLANT
TRANSDUCER W/STOPCOCK (MISCELLANEOUS) ×2 IMPLANT
TUBING CIL FLEX 10 FLL-RA (TUBING) ×2 IMPLANT
WIRE EMERALD 3MM-J .035X150CM (WIRE) ×2 IMPLANT
WIRE G VAS 035X260 STIFF (WIRE) ×2 IMPLANT

## 2020-04-19 NOTE — Procedures (Signed)
PATIENT'S NAME:  Tara [name], [name] DOB:      January 22, 1966      MR#:    [id number]     DATE OF RECORDING: 04/12/2020 [facility] REFERRING M.D.:  Delia Heady, MD Study Performed:   Baseline Polysomnogram HISTORY:  Tara [name] is a 54 y.o. year old Asian female patient seen here upon request/ referral on 04/01/2020 from [name], MD.   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 She is a Agricultural engineer, [employer] Paramedic. She has a medical history of Asthma, Hyperlipidemia, Migraine, Rotator cuff tear, and Spondylolisthesis. Tara [name] presented with LUE numbness and weakness along with severe dysarthria and dysphagia on 07/29/2019, with stroke work-up revealing 2 tiny right parietal infarcts deemed to be of Embolic origin. Loop recorder placed which has not shown atrial fibrillation thus far. Vascular risk factors include HLD and migraines. She has of small PFO for which she wants to wait on elective endovascular closure.  She was evaluated for possible participation in [place] trial but unfortunately did not meet criteria  The patient endorsed the Epworth Sleepiness Scale at 12/24 points.   The patient's weight 158 pounds with a height of 64 (inches), resulting in a BMI of 27.1 kg/m2. The patient's neck circumference measured 15 inches.  CURRENT MEDICATIONS: Aspirin, Vitamin D, COQ10, Omega-3, [place] PO, Probiotics, Maxalt, Crestor.   PROCEDURE:  This is a multichannel digital polysomnogram utilizing the Somnostar 11.2 system.  Electrodes and sensors were applied and monitored per AASM Specifications.   EEG, EOG, Chin and Limb EMG, were sampled at 200 Hz.  ECG, Snore and Nasal Pressure, Thermal Airflow, Respiratory Effort, CPAP Flow and Pressure, Oximetry was sampled at 50 Hz. Digital video and audio were recorded.      BASELINE STUDY: Lights Out was at 22:04 and Lights On at 05:16.  Total recording time  (TRT) was 432.5 minutes, with a total sleep time (TST) of 356 minutes.   The patient's sleep latency was 43.5 minutes.  REM latency was 95.5 minutes.  The sleep efficiency was 82.3 %.     SLEEP ARCHITECTURE: WASO (Wake after sleep onset) was 32.5 minutes.  There were 8.5 minutes in Stage N1, 180.5 minutes Stage N2, 60.5 minutes Stage N3 and 106.5 minutes in Stage REM.  The percentage of Stage N1 was 2.4%, Stage N2 was 50.7%, Stage N3 was 17.% and Stage R (REM sleep) was 29.9%.   RESPIRATORY ANALYSIS:  There were a total of 89 respiratory events:  11 obstructive apneas, 3 central apneas and 0 mixed apneas with a total of 14 apneas and an apnea index (AI) of 2.4 /hour. There were 75 hypopneas with a hypopnea index of 12.6 /hour. The patient also had several  respiratory event related arousals (RERAs).      The total APNEA/HYPOPNEA INDEX (AHI) was 15/hour and the total RESPIRATORY DISTURBANCE INDEX was  15 /hour.  83 events occurred in REM sleep and 10 events in NREM. The REM AHI was  46.8 /hour, versus a non-REM AHI of 1.4. The patient spent 132 minutes of total sleep time in the supine position and 224 minutes in non-supine.  The supine AHI was 27.3 versus a non-supine AHI of 7.8/h.  OXYGEN SATURATION & C02:  The Wake baseline 02 saturation was 96%, with the lowest being 75%. Time spent below 89% saturation equaled 53 minutes.   PERIODIC LIMB MOVEMENTS:   The patient  had a total of 0 Periodic Limb Movements.  The Periodic Limb Movement (PLM) index was 0 and the PLM Arousal index was 0/hour. Ongoing Snoring was noted in all positions. EKG was in keeping with normal sinus rhythm (NSR).   IMPRESSION:  1. Dominantly Obstructive Sleep Apnea (OSA) at a mild degree of AHI 15/h.  2. Strongly positional dependent, supine AHI was 27.3/h.  3. Strongly REM sleep dependent at REM AHI of 46.8/h.  4. Primary Snoring. 5. Sleep hypoxemia, prolonged at 53 minutes with nadir at 75%.     RECOMMENDATIONS:  1. CPAP autotitration is recommended to initiate therapy. PAP settings of pressure from 6- 16 cm water, 3 cm EPR , a mask to be fitted for the patient ( facial droop) and heated humidification.  2. RV with NP in 60-90 days post therapy begin. Any technical problems with mask fit, machine or compliance need to be addressed with [facility].    I certify that I have reviewed the entire raw data recording prior to the issuance of this report in accordance with the Standards of Accreditation of the American Academy of Sleep Medicine (AASM)   Melvyn Novas, MD Diplomat, American Board of Psychiatry and Neurology  Diplomat, American Board of Sleep Medicine Wellsite geologist, [place] [facility] [employer]

## 2020-04-19 NOTE — Progress Notes (Signed)
Patient was given discharge instructions. She verbalized understanding. 

## 2020-04-19 NOTE — Progress Notes (Signed)
IMPRESSION:   1. Dominantly Obstructive Sleep Apnea (OSA) at a mild degree of  AHI 15/h.  2. Strongly positional dependent, supine AHI was 27.3/h.  3. Strongly REM sleep dependent at REM AHI of 46.8/h.  4. Primary Snoring.  5. Sleep hypoxemia, prolonged at 53 minutes with nadir at 75%.    RECOMMENDATIONS:   1. CPAP autotitration is recommended to initiate therapy. PAP  settings of pressure from 6- 16 cm water, 3 cm EPR , a mask to be  fitted for the patient ( facial droop) and heated humidification.   2. RV with NP in 60-90 days post therapy begin. Any technical  problems with mask fit, machine or compliance need to be  addressed with [facility].

## 2020-04-19 NOTE — Progress Notes (Signed)
Patient is still oozing . Dr paged and is coming back to give Lidocaine and Epi.

## 2020-04-19 NOTE — Discharge Instructions (Signed)
Femoral Site Care This sheet gives you information about how to care for yourself after your procedure. Your health care provider may also give you more specific instructions. If you have problems or questions, contact your health care provider. What can I expect after the procedure? After the procedure, it is common to have:  Bruising that usually fades within 1-2 weeks.  Tenderness at the site. Follow these instructions at home: Wound care  Follow instructions from your health care provider about how to take care of your insertion site. Make sure you: ? Wash your hands with soap and water before you change your bandage (dressing). If soap and water are not available, use hand sanitizer. ? Change your dressing as told by your health care provider. ? Leave stitches (sutures), skin glue, or adhesive strips in place. These skin closures may need to stay in place for 2 weeks or longer. If adhesive strip edges start to loosen and curl up, you may trim the loose edges. Do not remove adhesive strips completely unless your health care provider tells you to do that.  Do not take baths, swim, or use a hot tub until your health care provider approves.  You may shower 24-48 hours after the procedure or as told by your health care provider. ? Gently wash the site with plain soap and water. ? Pat the area dry with a clean towel. ? Do not rub the site. This may cause bleeding.  Do not apply powder or lotion to the site. Keep the site clean and dry.  Check your femoral site every day for signs of infection. Check for: ? Redness, swelling, or pain. ? Fluid or blood. ? Warmth. ? Pus or a bad smell. Activity  For the first 2-3 days after your procedure, or as long as directed: ? Avoid climbing stairs as much as possible. ? Do not squat.  Do not lift anything that is heavier than 10 lb (4.5 kg), or the limit that you are told, until your health care provider says that it is safe.  Rest as  directed. ? Avoid sitting for a long time without moving. Get up to take short walks every 1-2 hours.  Do not drive for 24 hours if you were given a medicine to help you relax (sedative). General instructions  Take over-the-counter and prescription medicines only as told by your health care provider.  Keep all follow-up visits as told by your health care provider. This is important. Contact a health care provider if you have:  A fever or chills.  You have redness, swelling, or pain around your insertion site. Get help right away if:  The catheter insertion area swells very fast.  You pass out.  You suddenly start to sweat or your skin gets clammy.  The catheter insertion area is bleeding, and the bleeding does not stop when you hold steady pressure on the area.  The area near or just beyond the catheter insertion site becomes pale, cool, tingly, or numb. These symptoms may represent a serious problem that is an emergency. Do not wait to see if the symptoms will go away. Get medical help right away. Call your local emergency services (911 in the [place] Do not drive yourself to the hospital. Summary  After the procedure, it is common to have bruising that usually fades within 1-2 weeks.  Check your femoral site every day for signs of infection.  Do not lift anything that is heavier than 10 lb (4.5 kg), or the   limit that you are told, until your health care provider says that it is safe. This information is not intended to replace advice given to you by your health care provider. Make sure you discuss any questions you have with your health care provider. Document Revised: 05/28/2017 Document Reviewed: 05/28/2017 Elsevier Patient Education  2020 Elsevier Inc.  

## 2020-04-19 NOTE — Interval H&P Note (Signed)
History and Physical Interval Note:  04/19/2020 10:10 AM  Tara [name]  has presented today for surgery, with the diagnosis of PFO.  The various methods of treatment have been discussed with the patient and family. After consideration of risks, benefits and other options for treatment, the patient has consented to  Procedure(s): PATENT FORAMEN OVALE (PFO) CLOSURE (N/A) as a surgical intervention.  The patient's history has been reviewed, patient examined, no change in status, stable for surgery.  I have reviewed the patient's chart and labs.  Questions were answered to the patient's satisfaction.     [name]

## 2020-04-19 NOTE — Addendum Note (Signed)
Addended by: Melvyn Novas on: 04/19/2020 06:23 PM   Modules accepted: Orders

## 2020-04-19 NOTE — Progress Notes (Signed)
  Echocardiogram 2D Echocardiogram limited has been performed.  Leta Jungling M 04/19/2020, 3:10 PM

## 2020-04-19 NOTE — Progress Notes (Signed)
Injected right groin with lido with epi to help with site oozing.  Injected 8cc into the track and held pressure x 5 minutes.  Still some slight oozing so pressure dressing reapplied.  RN will continue to monitor site.

## 2020-04-20 ENCOUNTER — Encounter (HOSPITAL_COMMUNITY): Payer: Self-pay | Admitting: Cardiovascular Disease

## 2020-04-20 ENCOUNTER — Telehealth: Payer: Self-pay | Admitting: Neurology

## 2020-04-20 NOTE — Telephone Encounter (Signed)
-----   Message from Melvyn Novas, MD sent at 04/19/2020  6:23 PM EST ----- IMPRESSION:   1. Dominantly Obstructive Sleep Apnea (OSA) at a mild degree of  AHI 15/h.  2. Strongly positional dependent, supine AHI was 27.3/h.  3. Strongly REM sleep dependent at REM AHI of 46.8/h.  4. Primary Snoring.  5. Sleep hypoxemia, prolonged at 53 minutes with nadir at 75%.    RECOMMENDATIONS:   1. CPAP autotitration is recommended to initiate therapy. PAP  settings of pressure from 6- 16 cm water, 3 cm EPR , a mask to be  fitted for the patient ( facial droop) and heated humidification.   2. RV with NP in 60-90 days post therapy begin. Any technical  problems with mask fit, machine or compliance need to be  addressed with [facility].

## 2020-04-20 NOTE — Telephone Encounter (Signed)
I called pt. I advised pt that Dr. Vickey Huger reviewed their sleep study results and found that pt has sleep apnea. Dr. Vickey Huger recommends that pt starts auto CPAP 6-16 cm water pressure. I reviewed PAP compliance expectations with the pt. Pt is agreeable to starting a CPAP. I advised pt that an order will be sent to a DME, [facility] and [facility] will call the pt within about one week after they file with the pt's insurance. [facility]) will show the pt how to use the machine, fit for masks, and troubleshoot the CPAP if needed. A follow up appt will need to be made for insurance purposes with Dr. Vickey Huger or NP. Pt verbalized understanding to arrive 15 minutes early and bring their CPAP. A letter with all of this information in it will be mailed to the pt as a reminder. I verified with the pt that the address we have on file is correct. Pt verbalized understanding of results. Pt had no questions at this time but was encouraged to call back if questions arise. I have sent the order to [facility]) and have received confirmation that they have received the order.

## 2020-04-30 LAB — CUP PACEART REMOTE DEVICE CHECK
Date Time Interrogation Session: 20211127230415
Implantable Pulse Generator Implant Date: 20210303

## 2020-05-03 ENCOUNTER — Ambulatory Visit (INDEPENDENT_AMBULATORY_CARE_PROVIDER_SITE_OTHER): Payer: 59

## 2020-05-03 DIAGNOSIS — I639 Cerebral infarction, unspecified: Secondary | ICD-10-CM

## 2020-05-11 ENCOUNTER — Telehealth: Payer: Self-pay | Admitting: Cardiovascular Disease

## 2020-05-11 NOTE — Telephone Encounter (Signed)
The patient cancelled her 1 month closure follow-up because she will not have insurance coverage until 05/29/20. Scheduled her for follow-up with Carlean Jews, PA on 06/02/20. She was grateful for call and agrees with plan.

## 2020-05-11 NOTE — Telephone Encounter (Signed)
New message:     Patient states she is to follow up with doctor in one mo. There is no apts at  this time. Please call patient back.

## 2020-05-12 NOTE — Progress Notes (Signed)
[facility] Summary Report / Loop Recorder 

## 2020-05-13 ENCOUNTER — Ambulatory Visit: Payer: 59 | Admitting: Psychology

## 2020-05-19 DIAGNOSIS — Z0271 Encounter for disability determination: Secondary | ICD-10-CM

## 2020-05-26 ENCOUNTER — Ambulatory Visit: Payer: 59 | Admitting: Physician Assistant

## 2020-06-02 ENCOUNTER — Encounter: Payer: Self-pay | Admitting: Physician Assistant

## 2020-06-02 ENCOUNTER — Ambulatory Visit (INDEPENDENT_AMBULATORY_CARE_PROVIDER_SITE_OTHER): Payer: 59 | Admitting: Physician Assistant

## 2020-06-02 ENCOUNTER — Other Ambulatory Visit: Payer: Self-pay

## 2020-06-02 ENCOUNTER — Other Ambulatory Visit: Payer: Self-pay | Admitting: Physician Assistant

## 2020-06-02 VITALS — BP 98/68 | HR 95 | Ht 64.0 in | Wt 164.4 lb

## 2020-06-02 DIAGNOSIS — Z8774 Personal history of (corrected) congenital malformations of heart and circulatory system: Secondary | ICD-10-CM

## 2020-06-02 DIAGNOSIS — I639 Cerebral infarction, unspecified: Secondary | ICD-10-CM

## 2020-06-02 DIAGNOSIS — G5603 Carpal tunnel syndrome, bilateral upper limbs: Secondary | ICD-10-CM

## 2020-06-02 DIAGNOSIS — R079 Chest pain, unspecified: Secondary | ICD-10-CM

## 2020-06-02 DIAGNOSIS — Z8669 Personal history of other diseases of the nervous system and sense organs: Secondary | ICD-10-CM | POA: Diagnosis not present

## 2020-06-02 MED ORDER — AMOXICILLIN 500 MG PO CAPS: ORAL_CAPSULE | ORAL | 2 refills | Status: DC

## 2020-06-02 MED FILL — AMOXICILLIN 500 MG CAPSULE: 500 | 3 days supply | Qty: 12 | Fill #0

## 2020-06-02 NOTE — Patient Instructions (Signed)
Medication Instructions:  1.) amoxicillin 500 mg tablets---take 4 tablets (2000 mg) by mouth one hour (60 minutes) before any dental procedure  *If you need a refill on your cardiac medications before your next appointment, please call your pharmacy*   Lab Work: none If you have labs (blood work) drawn today and your tests are completely normal, you will receive your results only by: Marland Kitchen MyChart Message (if you have MyChart) OR . A paper copy in the mail If you have any lab test that is abnormal or we need to change your treatment, we will call you to review the results.   Testing/Procedures: Your physician has requested that you have an echocardiogram. Echocardiography is a painless test that uses sound waves to create images of your heart. It provides your doctor with information about the size and shape of your heart and how well your heart's chambers and valves are working. This procedure takes approximately one hour. There are no restrictions for this procedure.   Follow-Up: As scheduled  Other Instructions

## 2020-06-02 NOTE — Progress Notes (Signed)
[facility]   MULTIDISCIPLINARY HEART VALVE CLINIC                                     Cardiology Office Note:    Date:  06/02/2020   ID:  Tara [name], DOB 28-Dec-1965, MRN [id number]  PCP:  Tara Ladd, MD  [name] Cardiologist:  Dr. Jamey Ripa [facility] Electrophysiologist:  None   Referring MD: Tara Ladd, MD   1 month s/p PFO closure.   History of Present Illness:    Tara [name] is a 55 y.o. female with a hx of HLD, asthma, cryptogenic CVA, migraines and PFO s/p percutaneous PFO closure (04/19/20) who presents to clinic for follow up.   She had a cryptogenic stroke in March 2021 and referred to Dr. Excell [name] by Dr Tara [name] for consideration of PFO closure. She had a prior history of migraine headache, and presented with dysarthria, left arm numbness and weakness, and dysphagia. She was found to have 2 right parietal infarcts suggesting an embolic etiology. Hypercoagulable workup was negative and CTA studies showed no large vessel disease. The patient underwent loop recorder implantation and this has shown no evidence of atrial fibrillation to date. She underwent a transcranial doppler study that was positive for moderate to large right to left shunt. A TEE showed normal LV function, no significant valvular disease, and the presence of a PFO with strongly positive bubble study.   She underwent successful transcatheter PFO closure using an 18 mm PFO occluder device on 04/19/20. Post op limited echo showed normal EF and device placement with no atrial level shunting. She was discharged on aspirin and plavix x 6 months.   Today she presents to clinic for follow up. She has been having some afternoon sleepiness since PFO closure. Also having some mild chest pressure in the evening when she is tired. Chest pain worse with a deep breath in and sometimes worse when laying down. Feels more like a pressure and not sharp. Not related to exertion. No LE  edema, orthopnea or pND. Occasional dizziness but no syncope. Since stroke she has and numbness and tingling in hands. She may have carpal tunnel. Saw Dr. Amanda [name] and got a cortisone shot .   Past Medical History:  Diagnosis Date  . Asthma   . Hyperlipidemia   . Migraine   . Rotator cuff tear   . Spondylolisthesis    L4-5    Past Surgical History:  Procedure Laterality Date  . BUBBLE STUDY  07/30/2019   Procedure: BUBBLE STUDY;  Surgeon: Tara Ishikawa, MD;  Location: [facility] ENDOSCOPY;  Service: Cardiovascular;;  . LOOP RECORDER INSERTION N/A 07/30/2019   Procedure: LOOP RECORDER INSERTION;  Surgeon: Tara Maw, MD;  Location: [facility] INVASIVE CV LAB;  Service: Cardiovascular;  Laterality: N/A;  . No prior surgery    . PATENT FORAMEN OVALE(PFO) CLOSURE N/A 04/19/2020   Procedure: PATENT FORAMEN OVALE (PFO) CLOSURE;  Surgeon: Tara Bollman, MD;  Location: [facility] INVASIVE CV LAB;  Service: Cardiovascular;  Laterality: N/A;  . TEE WITHOUT CARDIOVERSION N/A 07/30/2019   Procedure: TRANSESOPHAGEAL ECHOCARDIOGRAM (TEE);  Surgeon: Tara Ishikawa, MD;  Location: [facility] ENDOSCOPY;  Service: Cardiovascular;  Laterality: N/A;    Current Medications: Current Meds  Medication Sig  . amoxicillin (AMOXIL) 500 MG capsule Take 4 tablets (2000 mg) by mouth 60 minutes before any dental procedure  .  aspirin EC 81 MG EC tablet Take 1 tablet (81 mg total) by mouth daily.  . cholecalciferol (VITAMIN D) 1000 UNITS tablet Take 1,000 Units by mouth daily.  . clopidogrel (PLAVIX) 75 MG tablet Take 1 tablet (75 mg total) by mouth daily.  . Coenzyme Q10 (COQ10 PO) Take 1 tablet by mouth daily.   . Omega-3 Fatty Acids (FISH OIL) 500 MG CAPS Natures made Fish oil pearls 500mg  BID  . Probiotic Product (PROBIOTIC PO) Take 1 capsule by mouth daily.   . rizatriptan (MAXALT) 10 MG tablet Take 10 mg by mouth as needed for migraine (headache).   . rosuvastatin (CRESTOR) 20 MG tablet Take 1 tablet (20 mg total) by  mouth daily.  scopolamine (TRANSDERM-SCOP) 1 MG/3DAYS Place 1 patch onto the skin daily as needed (Motion sickness).      Allergies:   Crestor [rosuvastatin], Cyclobenzaprine, and Tape   Social History   Socioeconomic History  . Marital status: Married    Spouse name: Not on file  . Number of children: 2  . Years of education: BSN  . Highest education level: Not on file  Occupational History  . Occupation: [employer]  Tobacco Use  . Smoking status: Never Smoker  . Smokeless tobacco: Never Used  Substance and Sexual Activity  . Alcohol use: No    Alcohol/week: 0.0 standard drinks    Comment: Rare  . Drug use: No  . Sexual activity: Yes    Birth control/protection: None  Other Topics Concern  . Not on file  Social History Narrative   Lives at home w/ her husband and son   Right-handed   Caffeine: seldom, soda on the weekend   Social Determinants of Health   Financial Resource Strain: Not on file  Food Insecurity: Not on file  Transportation Needs: Not on file  Physical Activity: Not on file  Stress: Not on file  Social Connections: Not on file     Family History: The patient's family history includes Heart disease (age of onset: 38) in her father; Hyperlipidemia in her brother; Hypertension in her maternal grandmother.  ROS:   Please see the history of present illness.    All other systems reviewed and are negative.  EKGs/Labs/Other Studies Reviewed:    The following studies were reviewed today:  PFO closure 04/19/20 PATENT FORAMEN OVALE (PFO) CLOSURE  Conclusion Successful transcatheter PFO closure using an 18 mm PFO occluder device with intracardiac echo and fluoroscopic guidance   ASA and clopidogrel x 6 months without interruption  SBE prophylaxis when indicated x 6 months  Limited echo in post-procedure area  Same day discharge protocol if no early complications  Recommendations Antiplatelet/Anticoag Recommend uninterrupted dual antiplatelet  therapy with Aspirin 81mg  daily and Clopidogrel 75mg  daily. 6 months, then long-term ASA alone   _________________  Limited echo 04/19/20 IMPRESSIONS 1. Limited study for PFO closure.  2. Left ventricular ejection fraction, by estimation, is 60 to 65%. The left ventricle has normal function.  3. Echogenic mass noted over the IAS, c/w prior device closure. There is no evidence for residual atrial level shunt.    EKG:  EKG is NOT ordered today.    Recent Labs: 07/29/2019: ALT 27 07/30/2019: TSH 1.831 04/16/2020: BUN 10; Creatinine, Ser 0.85; Hemoglobin 12.6; Platelets 390; Potassium 4.7; Sodium 143  Recent Lipid Panel    Component Value Date/Time   CHOL 241 (H) 07/29/2019 1132   TRIG 193 (H) 07/29/2019 1132   HDL 51 07/29/2019 1132   CHOLHDL  4.7 07/29/2019 1132   VLDL 39 07/29/2019 1132   LDLCALC 151 (H) 07/29/2019 1132     Risk Assessment/Calculations:       Physical Exam:    VS:  BP 98/68   Pulse 95   Ht 5\' 4"  (1.626 m)   Wt 164 lb 6.4 oz (74.6 kg)   LMP 11/24/2013   SpO2 97%   BMI 28.22 kg/m     Wt Readings from Last 3 Encounters:  06/02/20 164 lb 6.4 oz (74.6 kg)  04/19/20 159 lb (72.1 kg)  04/09/20 160 lb 9.6 oz (72.8 kg)     GEN: Well nourished, well developed in no acute distress HEENT: Normal NECK: No JVD; No carotid bruits LYMPHATICS: No lymphadenopathy CARDIAC: RRR, no murmurs, rubs, gallops RESPIRATORY:  Clear to auscultation without rales, wheezing or rhonchi  ABDOMEN: Soft, non-tender, non-distended MUSCULOSKELETAL:  No edema; No deformity  SKIN: Warm and dry NEUROLOGIC:  Alert and oriented x 3 PSYCHIATRIC:  Normal affect   ASSESSMENT:    1. S/P percutaneous patent foramen ovale closure   2. Cerebrovascular accident (CVA), unspecified mechanism (HCC)   3. History of migraine headaches   4. Bilateral carpal tunnel syndrome   5. Chest pain of uncertain etiology    PLAN:    In order of problems listed above:  1. Continue on aspirin and  plavix x 6 months. Then continue on aspirin alone. Went to dentist yesterday for cleaning without SBE prophylaxis. May require a filling. Will RX amoxicillin for subsequent dental encounters. She understands this only has to be continued x6 months. Having some pleuritic chest pain so will get a limited echo to rule out pericardial effusion.   2. now s/p PFO closure. Continue on aspirin and statin  3. These have slightly improved since PFO closure.   4. Pt sounds like she has carpal tunnel syndrome that may require surgery. She got a cortisone shot today. She will wait 6 months to finish course of plavix before considering surgery   5. Chest pain sounds pleuritic. Will get limited echo as above.   Medication Adjustments/Labs and Tests Ordered: Current medicines are reviewed at length with the patient today.  Concerns regarding medicines are outlined above.  Orders Placed This Encounter  Procedures  . ECHOCARDIOGRAM LIMITED  . ECHOCARDIOGRAM COMPLETE BUBBLE STUDY   Meds ordered this encounter  Medications  . amoxicillin (AMOXIL) 500 MG capsule    Sig: Take 4 tablets (2000 mg) by mouth 60 minutes before any dental procedure    Dispense:  12 capsule    Refill:  2    Patient Instructions  Medication Instructions:  1.) amoxicillin 500 mg tablets---take 4 tablets (2000 mg) by mouth one hour (60 minutes) before any dental procedure  *If you need a refill on your cardiac medications before your next appointment, please call your pharmacy*   Lab Work: none If you have labs (blood work) drawn today and your tests are completely normal, you will receive your results only by: 13/12/21 MyChart Message (if you have MyChart) OR . A paper copy in the mail If you have any lab test that is abnormal or we need to change your treatment, we will call you to review the results.   Testing/Procedures: Your physician has requested that you have an echocardiogram. Echocardiography is a painless test that uses  sound waves to create images of your heart. It provides your doctor with information about the size and shape of your heart and how well your heart's  chambers and valves are working. This procedure takes approximately one hour. There are no restrictions for this procedure.   Follow-Up: As scheduled  Other Instructions      Signed, Angelena Form, PA-C  06/02/2020 9:24 PM    [facility]

## 2020-06-03 ENCOUNTER — Ambulatory Visit: Payer: 59 | Admitting: Psychology

## 2020-06-06 LAB — CUP PACEART REMOTE DEVICE CHECK
Date Time Interrogation Session: 20220108230412
Implantable Pulse Generator Implant Date: 20210303

## 2020-06-07 ENCOUNTER — Ambulatory Visit (INDEPENDENT_AMBULATORY_CARE_PROVIDER_SITE_OTHER): Payer: 59

## 2020-06-07 DIAGNOSIS — I63411 Cerebral infarction due to embolism of right middle cerebral artery: Secondary | ICD-10-CM

## 2020-06-15 ENCOUNTER — Other Ambulatory Visit: Payer: Self-pay

## 2020-06-15 ENCOUNTER — Encounter: Payer: 59 | Attending: Psychology | Admitting: Psychology

## 2020-06-15 DIAGNOSIS — I639 Cerebral infarction, unspecified: Secondary | ICD-10-CM | POA: Diagnosis not present

## 2020-06-15 DIAGNOSIS — I69919 Unspecified symptoms and signs involving cognitive functions following unspecified cerebrovascular disease: Secondary | ICD-10-CM | POA: Diagnosis not present

## 2020-06-15 DIAGNOSIS — I69398 Other sequelae of cerebral infarction: Secondary | ICD-10-CM | POA: Diagnosis not present

## 2020-06-15 DIAGNOSIS — R202 Paresthesia of skin: Secondary | ICD-10-CM | POA: Diagnosis not present

## 2020-06-15 DIAGNOSIS — F0630 Mood disorder due to known physiological condition, unspecified: Secondary | ICD-10-CM | POA: Diagnosis present

## 2020-06-17 ENCOUNTER — Encounter: Payer: Self-pay | Admitting: Psychology

## 2020-06-17 NOTE — Progress Notes (Signed)
Neuropsychology Visit  Patient:  Tara [name]   DOB: [birth year]  MR Number: [id number]  Location: [facility] MEDIC[facility] PHYSICAL MEDICINE AND REHABILITATION [address] [place], [id number] [facility] [place] [address] Dept: [phone]  Date of Service: 06/15/2020  Start: 10 AM End: 11 AM  Today's visit was an in person visit is conducted in my outpatient clinic office.  The patient myself were present for this visit.  Duration of Service: 1 Hour  Provider/Observer:     Hershal Coria PsyD  Chief Complaint:      Chief Complaint  Patient presents with  . Cerebrovascular Accident  . Anxiety  . Depression  . Other    Reason For Service:      Tara [name] is a 55 year old female referred by Ihor Austin, NP with [facility] for neuropsychological consultation.  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  The patient had a cerebrovascular accident on 07/29/2019 and is continued to have difficulties with adjustment and residual cognitive deficits and changes, changes in mood and residual left-handed paresthesias.  She has significant fear about stroke and other vascular events with personal experience needs of her father dying of a heart attack when he was 18 years old and her aunt recently dying of a stroke at 75 years old.  Treatment Interventions:  Cognitive behavioral and other coping strategies around adapting to residual effects of her CVA.  Participation Level:   Active  Participation Quality:  Appropriate      Behavioral Observation:  Well Groomed, Alert, and Appropriate.   Current Psychosocial Factors: The patient continues to have significant anxiety and depression and coping with residual effects of her CVA.  She recently had heart valve surgery and has been stressed around issues associated with that.  The patient has been  trouble following some of the foundational issues such as adjustments to sleep patterns and sleep hygiene issues.  Content of Session:   Reviewed current symptoms and continue to work on therapeutic interventions around coping and adjustment to late effects of her CVA.  Effectiveness of Interventions: Patient was fully engaged in rapport has been easily to establish.  The patient actively participates in therapeutic interventions and working on coping resources.  Target Goals:   Improved coping and adjustment issues around residual effects of her CVA.  Goals Last Reviewed:   06/15/2020  Goals Addressed Today:    Today we worked on coping and adjustment issues and working on some of the foundational issues to reduce risk of further stroke and cope with some of the stressors she has with worrying about future risk.  Impression/Diagnosis:   Tara Golden A. Tara [name] is a 55 year old female referred by Ihor Austin, NP with [facility] for neuropsychological consultation.  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  The patient had a cerebrovascular accident on 07/29/2019 and is continued to have difficulties with adjustment and residual cognitive deficits and changes, changes in mood and residual left-handed paresthesias.  She has significant fear about stroke and other vascular events with personal experience needs of her father dying of a heart attack when he was 61 years old and her aunt recently dying of a stroke at 39 years old.  The patient is clearly having adjustment difficulties with residual effects of her cerebrovascular accident including significant anxiety and depression which are exacerbating residual effects of her cerebrovascular accident  that occurred in March of this year.  The patient has not been able to return to work.  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 Clinical Psychologist Neuropsychologist

## 2020-06-18 NOTE — Progress Notes (Signed)
[facility] Summary Report / Loop Recorder 

## 2020-06-23 ENCOUNTER — Other Ambulatory Visit: Payer: Self-pay

## 2020-06-23 ENCOUNTER — Ambulatory Visit (HOSPITAL_COMMUNITY): Payer: 59 | Attending: Internal Medicine

## 2020-06-23 DIAGNOSIS — R079 Chest pain, unspecified: Secondary | ICD-10-CM | POA: Insufficient documentation

## 2020-06-23 DIAGNOSIS — Z8774 Personal history of (corrected) congenital malformations of heart and circulatory system: Secondary | ICD-10-CM | POA: Diagnosis not present

## 2020-06-23 LAB — ECHOCARDIOGRAM LIMITED
Area-P 1/2: 3.31 cm2
S' Lateral: 2.1 cm

## 2020-06-29 ENCOUNTER — Encounter: Payer: 59 | Attending: Psychology | Admitting: Psychology

## 2020-06-29 ENCOUNTER — Ambulatory Visit: Payer: 59 | Admitting: Nurse Practitioner

## 2020-06-29 ENCOUNTER — Other Ambulatory Visit (HOSPITAL_COMMUNITY): Payer: Self-pay | Admitting: Family Medicine

## 2020-06-29 ENCOUNTER — Other Ambulatory Visit: Payer: Self-pay

## 2020-06-29 DIAGNOSIS — I639 Cerebral infarction, unspecified: Secondary | ICD-10-CM | POA: Insufficient documentation

## 2020-06-29 DIAGNOSIS — F0630 Mood disorder due to known physiological condition, unspecified: Secondary | ICD-10-CM | POA: Insufficient documentation

## 2020-06-29 DIAGNOSIS — R202 Paresthesia of skin: Secondary | ICD-10-CM | POA: Diagnosis not present

## 2020-06-29 DIAGNOSIS — I69398 Other sequelae of cerebral infarction: Secondary | ICD-10-CM | POA: Insufficient documentation

## 2020-06-29 DIAGNOSIS — I69919 Unspecified symptoms and signs involving cognitive functions following unspecified cerebrovascular disease: Secondary | ICD-10-CM

## 2020-06-29 DIAGNOSIS — I63411 Cerebral infarction due to embolism of right middle cerebral artery: Secondary | ICD-10-CM | POA: Insufficient documentation

## 2020-07-04 ENCOUNTER — Encounter: Payer: Self-pay | Admitting: Psychology

## 2020-07-04 NOTE — Progress Notes (Addendum)
Neuropsychology Visit  Patient:  Tara [name]   DOB: May 26, 1966  MR Number: [id number]  Location: [facility] [address] [place], [id number] [facility] [place] [address] Dept: [phone]  Date of Service: 06/29/2020  Start: 10 AM End: 11 AM  Today's visit was an in person visit is conducted in my outpatient clinic office.  The patient myself were present for this visit.  Duration of Service: 1 Hour  Provider/Observer:     Hershal Coria PsyD  Chief Complaint:      Chief Complaint  Patient presents with  . Cerebrovascular Accident  . Depression  . Anxiety  . Memory Loss    Reason For Service:      Tara [name] is a 55 year old female referred by Ihor Austin, NP with [facility] for neuropsychological consultation.  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  The patient had a cerebrovascular accident on 07/29/2019 and is continued to have difficulties with adjustment and residual cognitive deficits and changes, changes in mood and residual left-handed paresthesias.  She has significant fear about stroke and other vascular events with personal experience needs of her father dying of a heart attack when he was 63 years old and her aunt recently dying of a stroke at 64 years old.  Treatment Interventions:  Cognitive behavioral and other coping strategies around adapting to residual effects of her CVA.  Participation Level:   Active  Participation Quality:  Appropriate      Behavioral Observation:  Well Groomed, Alert, and Appropriate.   Current Psychosocial Factors: The patient continues to have significant anxiety and depressive type symptoms and difficulty coping with residual effects of her CVA including around her motor deficits.  Content of Session:   Reviewed current symptoms  and continue to work on therapeutic interventions around coping and adjustment to late effects of her CVA.  Effectiveness of Interventions: Patient was fully engaged in rapport has been easily to establish.  The patient actively participates in therapeutic interventions and working on coping resources.  Target Goals:   Improved coping and adjustment issues around residual effects of her CVA.  Goals Last Reviewed:   06/29/2020  Goals Addressed Today:    Today we worked on coping and adjustment issues and working on some of the foundational issues to reduce risk of further stroke and cope with some of the stressors she has with worrying about future risk.  Impression/Diagnosis:   Tara [name] is a 55 year old female referred by Ihor Austin, NP with [facility] for neuropsychological consultation.  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  The patient had a cerebrovascular accident on 07/29/2019 and is continued to have difficulties with adjustment and residual cognitive deficits and changes, changes in mood and residual left-handed paresthesias.  She has significant fear about stroke and other vascular events with personal experience needs of her father dying of a heart attack when he was 73 years old and her aunt recently dying of a stroke at 73 years old.  The patient is clearly having adjustment difficulties with residual effects of her cerebrovascular accident including significant anxiety and depression which are exacerbating residual effects of her cerebrovascular accident that occurred in March of this year.  The patient has not been able to return to work.  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 Clinical Psychologist Neuropsychologist

## 2020-07-05 MED FILL — CLOPIDOGREL 75 MG TABLET: 75 | 30 days supply | Qty: 30 | Fill #1

## 2020-07-06 ENCOUNTER — Ambulatory Visit (INDEPENDENT_AMBULATORY_CARE_PROVIDER_SITE_OTHER): Payer: 59 | Admitting: Adult Health

## 2020-07-06 ENCOUNTER — Encounter: Payer: Self-pay | Admitting: Adult Health

## 2020-07-06 VITALS — BP 101/70 | HR 71 | Ht 64.0 in | Wt 162.6 lb

## 2020-07-06 DIAGNOSIS — G5603 Carpal tunnel syndrome, bilateral upper limbs: Secondary | ICD-10-CM

## 2020-07-06 DIAGNOSIS — I69398 Other sequelae of cerebral infarction: Secondary | ICD-10-CM

## 2020-07-06 DIAGNOSIS — R072 Precordial pain: Secondary | ICD-10-CM

## 2020-07-06 DIAGNOSIS — I639 Cerebral infarction, unspecified: Secondary | ICD-10-CM | POA: Diagnosis not present

## 2020-07-06 DIAGNOSIS — Q211 Atrial septal defect: Secondary | ICD-10-CM

## 2020-07-06 DIAGNOSIS — G4733 Obstructive sleep apnea (adult) (pediatric): Secondary | ICD-10-CM | POA: Diagnosis not present

## 2020-07-06 DIAGNOSIS — F0630 Mood disorder due to known physiological condition, unspecified: Secondary | ICD-10-CM

## 2020-07-06 DIAGNOSIS — Q2112 Patent foramen ovale: Secondary | ICD-10-CM

## 2020-07-06 NOTE — Progress Notes (Signed)
[facility] [address] [place] [address] [phone]       STROKE FOLLOW UP NOTE  [name] Date of Birth:  [birth year] Medical Record Number:  [id number]   Reason for Referral: stroke follow up    CHIEF COMPLAINT:  Chief Complaint  Patient presents with  . Follow-up    Rm 14, alone. Last seen 04/01/20. Follow up for CPAP/CVA. Ambulated with cane, no falls since last seen. Woke up about 2am last night with pressure in chest. Went away on its own around 530am. Worse when she turns on her side. This was the first time this has happened. She has not started CPAP. [employer] told her machine still on back order, waiting to hear about this still.    HPI:   Today, 07/06/2020, Tara [name] returns for 40-monthstroke follow-up.  Overall stable from stroke standpoint without new stroke/TIA symptoms and reports residual cognitive impairment and gait impairment. Use of cane at times - denies any recent falls.she also continues to experience anxiety and depression poststroke and has been followed by Dr. RSima Mataswhich she believes has been beneficial.  She remains on long term disability and still in the process of applying for social security disability. Loop recorder has not shown atrial fibrillation thus far.    Underwent sleep study due to continued complaints of excessive daytime fatigue which was completed on 04/12/2020 which showed evidence of sleep apnea and recommend initiation of CPAP.   She is currently awaiting CPAP machine as this has been on back order.   She also underwent PFO closure successfully without complication on 194/76/5465and recommended aspirin and Plavix for 615-monthuration. She has remained on aspirin and plavix but does report increased bruising therefore started to take aspirin 8114mvery other day and has continued on plavix daily - denies any concerning bleeding.  She has not further discuss this with cardiology.  Complains of  left hand numbness with EMG/NCV completed 03/17/2020 to further evaluate for possible underlying carpal tunnel or post stroke deficit.  Study showed bilateral moderately to severe L>R carpal tunnel syndrome therefore referral placed to Ortho for further treatment options.  They plan on proceeding with release procedure but currently holding until she is able to come off Plavix (around 09/2020). She did receive cortisone injections with improvement of severe pain (not waking her up at night) but continues to feel numbness. She also uses braces at night time as instructed by ortho.   She also reports episode last night of waking up with substernal pressure.  This pain did not radiate and denies any shortness of breath, nausea/vomiting, diaphoresis or palpitations.  Symptoms improved after she propped 3 pillows behind her - reports lasting for approximately 3 hours.  She denies any reoccurrence or prior symptoms.     History provided for reference purposes only Update 03/01/2020 JM: Ms. AjeKimmonsturns for 3-m17-monthoke follow-up unaccompanied.  Residual deficits of cognitive impairment, dysarthria, imbalance and left hand weakness and numbness as well as continued excessive fatigue.  She recently completed speech therapy as she met stated rehab goals but due to continued cognitive impairment and poststroke anxiety/depression, she has initial evaluation with Dr. RodeSima Matas13.  Cognition impairment with visual difficulty with high-level functioning, short-term recall, attention, processing and visual-spatial skills.  She also feels overwhelmed in crowded areas such as grocery store or needing to complete a more complex task.  She does report improvement of dizziness/imbalance with less frequency as before - will continue  to feel with quick head movements and bending over.  Left hand paresthesias intermittent typically occurring with increased use of left hand or the middle of the night with burning sensation,  numbness/tingling and "achy" feeling.  Symptoms present 3rd and 4th digit of left hand radiating in the middle of her wrist and stopping 1-2" below elbow.  Excessive daytime fatigue persists despite sleeping well through the night.  Does report occasional snoring.  She remains on long term disability and in the process of completing social security disability.  Remains on aspirin and Crestor for secondary stroke prevention without side effects.  Blood pressure today 119/84.  Loop recorder has not shown atrial fibrillation thus far.  No further concerns at this time.  Update 11/18/2019 JM: Tara [name] returns for follow-up regarding cryptogenic right parietal stroke in 07/2019.  Residual deficits of mild cognitive impairment, left hand weakness, fatigue with decreased activity tolerance and dizziness/imbalance.  Continues to participate in speech therapy for impairments of attention, processing and visual-spatial skills as well as dysarthria with reported ongoing improvement.  Continues to work with PT for gait training, environmental scanning, and balance and dizziness progressing towards goals.  Continues to work with OT for improvement of LUE fine motor control and coordination and environmental standing/dynamic balance.  She does report ongoing difficulty concentrating especially when having multiple tasks to complete.  Also reports difficulty going into crowded places such as stores as she feels visually overstimulated and starts to feel anxious.  She denies anxiety or depression but based on PHQ-9 of 16 likely underlying depression.  Becomes tearful during visit as she is frustrated with ongoing deficits and feels as though she should have recovered more than what she has.  Reports left hand numbness/tingling located left middle and ring finger with radiation to elbow can wake her up in the middle of the night or become present with increased hand use. 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  Remains on aspirin and Crestor for secondary stroke prevention. Recent lipid panel by PCP showed LDL 53.  Blood pressure today 113/78.  Loop recorder is not shown atrial fibrillation thus far.  No further concerns at this time.  Update 09/29/2019 Dr. Leonie Man ; patient is seen upon request that she call the office requesting medical leave be prolonged as she is having cognitive difficulties and fatigue and dizziness.  She states that she has been getting outpatient physical occupational therapy and but complains of a transient positional dizziness when she looks down or turns her neck quickly.  She is been getting some vestibular therapy which has been helping.  She was recently referred to speech therapy for mental status slowness and progressing with appointment has not happened yet.  She feels overall there is slow and steady progress but she is not ready to return to work yet.  She wants to be out of work till her next assessment with her primary physician Dr. Jacelyn Grip which is scheduled for June 6.  She has not had any recurrent stroke or TIA symptoms.  She does complain of some intermittent numbness and tightness in her middle 2 fingers in the left hand particularly when she is tired.  She is tolerating aspirin well without bruising or bleeding.  Blood pressures well controlled today it is 116/77.  She had to reduce the dose of Crestor to 20 mg that she had some muscle aches and pains which appear not to  have improved.  She does take coenzyme Q 10 as well.  She denies any headache, slurred speech, increasing gait or balance problems.  Initial visit 08/13/2019 JM: Tara [name] is a 55 year old female who is being seen today for hospital follow-up.  Residual stroke deficits of mild left-sided weakness, dizziness with quick head movements and unsteadiness on feet. She does endorse some improvement but recently just started therapies. She does endorse increased fatigue  since discharge and feels like her mind is slower/delayed.  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  She initially had FMLA which expired on 3/15 and she is requesting for extension.  She is eager to continue with therapies for ongoing improvement.  She continues on DAPT but will be completing 3-week Plavix course in the near future.  Denies bleeding or bruising.  She continues on Crestor 40 mg daily but does endorse myalgias bilateral upper extremities and torso.  Blood pressure today 118/88.  Loop recorder has not shown atrial fibrillation thus far.  No further concerns at this time.  Stroke admission 07/29/2019: Tara [name] is a 55 y.o. female with history of HLD, migraine and asthma who presented with HA since 2/28 who woke w/ LUE numbness and weakness, severe dysarthria and dysphagia on 07/29/2019.  Evaluated by stroke team and Dr. Leonie Man with stroke work-up revealing 2 tiny right parietal infarcts embolic secondary to unknown source.  In addition to acute infarcts, MRI also showed several small chronic infarcts in the cerebellum but per review, Dr Leonie Man these were likely flow-voids and not old strokes.  TEE did not show evidence of PFO.  Recommended further evaluation with TCD which was suggestive of medium to large PFO.  Recommended further discussion outpatient regarding possible PFO closure.  Loop recorder placed to rule out atrial fibrillation as possible cause of stroke.  Hypercoagulable work-up negative.  Recommended DAPT for 3 weeks and aspirin alone.  LDL 151 and recommended increasing Crestor frequency to 40 mg daily.  No history of HTN or DM.  Other stroke risk factors include migraines but no prior history of stroke.  Discharged home in stable condition without therapy needs.  Stroke:   2 tiny R parietal  infarcts embolic secondary to unknown source.Marland Kitchen PFO unclear if related to her stroke ROPE score 6 ) 62% chance pfo is related to stroke )  Code  Stroke CT head hyperdense R MCA bifurcation. No acute abnormality. R maxillary sinus dz. ASPECTS 10.     CTA head & neck no LVO. Mild distal SMALL VESSEL DISEASE anterior and posterior circulation. Neck ok   CT perfusion Unremarkable   MRI  2 tiny acute cortical R brain (posterior middle frontal gyrus and posterior R parietal lobe). Read as old cerebellar infarcts but  Dr. Leonie Man feels they are flow voids and not old stroke. Mild paranasal sinus dz.   2D Echo normal   TEE  PFO present  EEG normal   LE dopplers no DVT   LDL 151 mg%  HgbA1c 5.3  UDS not done   TSH normal    ROS:   14 system review of systems performed and negative with exception of those listed in HPI  Depression screen [facility] 2/9 07/06/2020 03/01/2020 11/18/2019 03/05/2013  Decreased Interest _0 Down, Depressed, Hopeless _1 0  PHQ - 2 Score _2 Altered sleeping 0 1 2 -  Tired, decreased energy _3 -  Change in appetite 0 0 1 -  Feeling bad or failure about yourself  _0 -  Trouble concentrating _1 -  Moving slowly or fidgety/restless _2 -  Suicidal thoughts 0 0 0 -  PHQ-9 Score _3 -  Difficult doing work/chores Somewhat difficult Somewhat difficult Very difficult -   GAD 7 : Generalized Anxiety Score 07/06/2020 03/01/2020 11/18/2019  Nervous, Anxious, on Edge _4 Control/stop worrying _5 Worry too much - different things _6 Trouble relaxing 0 1 1  Restless 0 2 1  Easily annoyed or irritable _7 Afraid - awful might happen _8 Total GAD 7 Score _9 Anxiety Difficulty Somewhat difficult Somewhat difficult -        PMH:  Past Medical History:  Diagnosis Date  . Asthma   . Hyperlipidemia   . Migraine   . Rotator cuff tear   . Spondylolisthesis    L4-5    PSH:  Past Surgical History:  Procedure Laterality Date  . BUBBLE STUDY  07/30/2019   Procedure: BUBBLE STUDY;  Surgeon: Donato Heinz, MD;  Location: [place];  Service:  Cardiovascular;;  . LOOP RECORDER INSERTION N/A 07/30/2019   Procedure: LOOP RECORDER INSERTION;  Surgeon: Evans Lance, MD;  Location: [place] [facility];  Service: Cardiovascular;  Laterality: N/A;  . No prior surgery    . PATENT FORAMEN OVALE(PFO) CLOSURE N/A 04/19/2020   Procedure: PATENT FORAMEN OVALE (PFO) CLOSURE;  Surgeon: Sherren Mocha, MD;  Location: [place] [facility];  Service: Cardiovascular;  Laterality: N/A;  . TEE WITHOUT CARDIOVERSION N/A 07/30/2019   Procedure: TRANSESOPHAGEAL ECHOCARDIOGRAM (TEE);  Surgeon: Donato Heinz, MD;  Location: [facility] ENDOSCOPY;  Service: Cardiovascular;  Laterality: N/A;    Social History:  Social History   Socioeconomic History  . Marital status: Married    Spouse name: Not on file  . Number of children: 2  . Years of education: BSN  . Highest education level: Not on file  Occupational History  . Occupation: [employer]  Tobacco Use  . Smoking status: Never Smoker  . Smokeless tobacco: Never Used  Substance and Sexual Activity  . Alcohol use: No    Alcohol/week: 0.0 standard drinks    Comment: Rare  . Drug use: No  . Sexual activity: Yes    Birth control/protection: None  Other Topics Concern  . Not on file  Social History Narrative   Lives at home w/ her husband and son   Right-handed   Caffeine: seldom, soda on the weekend   Social Determinants of Health   Financial Resource Strain: Not on file  Food Insecurity: Not on file  Transportation Needs: Not on file  Physical Activity: Not on file  Stress: Not on file  Social Connections: Not on file  Intimate Partner Violence: Not on file    Family History:  Family History  Problem Relation Age of Onset  . Heart disease Father 82       MI  . Hypertension Maternal Grandmother   . Hyperlipidemia Brother     Medications:   Current Outpatient Medications on File Prior to Visit  Medication Sig Dispense Refill  . amoxicillin (AMOXIL) 500 MG capsule Take 4 tablets  (2000 mg) by mouth 60 minutes before any dental procedure 12 capsule 2  . aspirin EC 81 MG EC tablet Take 1 tablet (81 mg  total) by mouth daily. (Patient taking differently: Take 81 mg by mouth every other day. Went to every other day starting in January d/t bruising)    . cholecalciferol (VITAMIN D) 1000 UNITS tablet Take 1,000 Units by mouth daily.    . clopidogrel (PLAVIX) 75 MG tablet Take 1 tablet (75 mg total) by mouth daily. 90 tablet 1  . Coenzyme Q10 (COQ10 PO) Take 1 tablet by mouth daily.     . Omega-3 Fatty Acids (FISH OIL) 500 MG CAPS Natures made Fish oil pearls 561m BID 180 capsule 1  . Probiotic Product (PROBIOTIC PO) Take 1 capsule by mouth daily.     . rizatriptan (MAXALT) 10 MG tablet Take 10 mg by mouth as needed for migraine (headache).     . rosuvastatin (CRESTOR) 20 MG tablet Take 1 tablet (20 mg total) by mouth daily. 90 tablet 3  . scopolamine (TRANSDERM-SCOP) 1 MG/3DAYS Place 1 patch onto the skin daily as needed (Motion sickness).      No current facility-administered medications on file prior to visit.    Allergies:   Allergies  Allergen Reactions  . Crestor [Rosuvastatin] Other (See Comments)    Muscle pain with 40 mg  . Cyclobenzaprine Other (See Comments)    "loopy"/ Can take 0.5 dose  . Tape Rash     Physical Exam  Vitals:   07/06/20 0946  BP: 101/70  Pulse: 71  SpO2: 97%  Weight: 162 lb 9.6 oz (73.8 kg)  Height: 5' 4" (1.626 m)   Body mass index is 27.91 kg/m. No exam data present  General: well developed, well nourished, pleasant middle-age female, seated, in no evident distress Head: head normocephalic and atraumatic.   Neck: supple with no carotid or supraclavicular bruits Cardiovascular: regular rate and rhythm, no murmurs Musculoskeletal: no deformity Skin:  no rash/petichiae Vascular:  Normal pulses all extremities   Neurologic Exam Mental Status: Awake and fully alert.   Occasional decreased volume with occasional speech  hesitancy.  Oriented to place and time. Recent memory impaired and remote memory intact. Attention span, concentration and fund of knowledge appropriate during visit. Mood and affect appropriate.   Cranial Nerves:  Pupils equal, briskly reactive to light. Extraocular movements full without nystagmus. Visual fields full to confrontation. Hearing intact. Decreased sensory left lower face. Left nasolabial fold flatting. tongue, palate moves normally and symmetrically.  Motor: Normal bulk and tone. Normal strength in all tested extremity muscles except mild diminished fine finger movements on the left  Sensory.: slight decreased light touch, pinprick and vibratory sensation LUE and LLE compared to right side Coordination: Rapid alternating movements normal in all extremities except decreased left hand. Finger-to-nose and heel-to-shin performed accurately bilaterally. Gait and Station: Arises from chair without difficulty. Stance is normal. Gait demonstrates normal stride length with mild imbalance and unsteadiness and use of cane Reflexes: 1+ and symmetric. Toes downgoing.       ASSESSMENT/PLAN: Tara [name] a 55y.o. year old female presented with LUE numbness and weakness along with severe dysarthria and dysphagia on 07/29/2019 with stroke work-up revealing 2 tiny right parietal infarcts embolic secondary to unknown source s/p loop recorder as well as evidence of PFO.  Vascular risk factors include HLD and migraines.      R parietal strokes, cryptogenic -Residual deficits: Higher level cognitive impairment, imbalance, decreased left hand dexterity and post stroke anxiety/depression.  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 Currently on long-term disability through PCP and in the process of applying for Social Security disability.  -Loop recorder has not shown atrial fibrillation thus far  -continue monthly monitoring by cardiology -Continue aspirin, Plavix and Crestor 20 mg daily for stroke prevention (complete Plavix in 09/2020 as this will be 6 months post PFO closure and then aspirin alone) -maintain aggressive risk factor modification with PCP with strict control of hypertension blood pressure goal below 130/90 and lipids with LDL cholesterol goal below 70 mg percent  PFO -s/p successful closure 04/19/2020 -per cards recommendation, 6 months DAPT then aspirin alone -Advised to ensure she is taking aspirin 81 mg daily as well as Plavix 75 mg daily -she has been taking aspirin every other day due to bruising -she was advised to further discuss with cardiology  Depression/anxiety, post stroke Adjustment disorder, poststroke -GAD-7 score 6 (prior 8); PHQ-9 9 (prior 10) -Established care with Dr. Sima Matas which she has been experiencing great benefit -Declines interest in medication management -advised to further discuss with PCP if she wishes to proceed in the future  Sleep apnea, new dx -Sleep study 04/12/2020 mild OSA with AHI 15/h with supine 27.3/h and REM sleep 46.8/h with sleep hypoxemia prolonged at 53 minutes with nadir at 75% -recommend initiating CPAP -She is currently awaiting CPAP machine as they are on backorder -Advised her to call office after receiving machine to schedule initial compliance visit 60 to 90 days per insurance requirements  B/l carpal tunnel syndrome -c/o L hand numbness/tingling, pain and weakness -EMG/NCV 03/25/2020 moderate to severe L>R carpal tunnel syndrome -Plans on pursuing carpal tunnel release once she completes duration of Plavix post PFO closure -Encouraged continued use of carpal tunnel braces as well as routine follow-up with orthopedics  Substernal pressure -Awaken from sleep last night with substernal pressure -Symptoms since resolved without recurrence -Currently, no red flags or emergent concerns -Advised to follow-up with  PCP for further evaluation - per reported symptoms, sounds to be more GI related but unable to rule out cardiac source -advised to call 911 immediately if symptoms return and associated with radiating pain into jaw or left arm, diaphoresis, nausea or any other cardiac related symptoms     Follow-up in 6 months or call earlier if needed   CC:  [facility] provider: Dr. Timothy Lasso, Edwyna Shell, MD    I spent 40 minutes of face-to-face and non-face-to-face time with patient.  This included previsit chart review, lab review, study review, order entry, electronic health record documentation, patient education regarding cryptogenic stroke, residual deficits and poststroke depression, s/p PFO closure, new diagnosis of sleep apnea, evidence of bilateral carpal tunnel syndrome, importance of managing stroke risk factors and answered all other questions to patient satisfaction   Frann Rider, AGNP-BC  [facility] [address] [place], [place] [address]  Phone [phone] Fax [phone] Note: This document was prepared with digital dictation and possible smart phrase technology. Any transcriptional errors that result from this process are unintentional.

## 2020-07-06 NOTE — Progress Notes (Signed)
I agree with the above plan 

## 2020-07-06 NOTE — Patient Instructions (Addendum)
Ensure follow up to obtain CPAP machine once available - call once you obtain machine and we will schedule initial CPAP compliance visit 60-90 days after obtaining  Continue to follow with cardiology regarding PFO closure - ongoing use of aspirin and plavix for 6 months post procedure then aspirin alone  Your loop recorder has not shown atrial fibrillation thus far - will continue to be monitored by cardiology  Continue to follow with ortho to under go carpal tunnel release once able  Continue to follow with Dr. Kieth Brightly in March as scheduled  Continue aspirin 81 mg daily and clopidogrel 75 mg daily  and Crestor  for secondary stroke prevention  Continue to follow up with PCP regarding cholesterol and blood pressure management  Maintain strict control of hypertension with blood pressure goal below 130/90 and cholesterol with LDL cholesterol (bad cholesterol) goal below 70 mg/dL.       Followup in the future with me in 6 months or call earlier if needed       Thank you for coming to see [place] at [facility]. I hope we have been able to provide you high quality care today.  You may receive a patient satisfaction survey over the next few weeks. We would appreciate your feedback and comments so that we may continue to improve ourselves and the health of our patients.

## 2020-07-07 ENCOUNTER — Telehealth: Payer: Self-pay | Admitting: Adult Health

## 2020-07-07 NOTE — Telephone Encounter (Signed)
Contacted the Pt. Today to collect payment for her '[name]" form.But pt states that she does not need it to be filled out since her PCP is doing it for her.

## 2020-07-09 LAB — CUP PACEART REMOTE DEVICE CHECK
Date Time Interrogation Session: 20220210230326
Implantable Pulse Generator Implant Date: 20210303

## 2020-07-12 ENCOUNTER — Ambulatory Visit (INDEPENDENT_AMBULATORY_CARE_PROVIDER_SITE_OTHER): Payer: 59

## 2020-07-12 DIAGNOSIS — I63411 Cerebral infarction due to embolism of right middle cerebral artery: Secondary | ICD-10-CM | POA: Diagnosis not present

## 2020-07-15 NOTE — Progress Notes (Signed)
[facility] Summary Report / Loop Recorder 

## 2020-08-12 LAB — CUP PACEART REMOTE DEVICE CHECK
Date Time Interrogation Session: 20220315230451
Implantable Pulse Generator Implant Date: 20210303

## 2020-08-16 ENCOUNTER — Telehealth: Payer: Self-pay | Admitting: Adult Health

## 2020-08-16 ENCOUNTER — Ambulatory Visit (INDEPENDENT_AMBULATORY_CARE_PROVIDER_SITE_OTHER): Payer: 59

## 2020-08-16 DIAGNOSIS — I639 Cerebral infarction, unspecified: Secondary | ICD-10-CM | POA: Diagnosis not present

## 2020-08-16 NOTE — Telephone Encounter (Signed)
[name] [facility]) called, checking on status of form faxed on 07/07/20, refaxed 2/18, refaxed 3/1 to be filled out.  Would like a call from the nurse.

## 2020-08-17 NOTE — Telephone Encounter (Signed)
I called [employer] and spoke to [place] and relayed that per pt, on 07-07-20 that she was going to have pcp fill out.  He made note of this.

## 2020-08-23 NOTE — Progress Notes (Signed)
[facility] Summary Report / Loop Recorder 

## 2020-08-26 ENCOUNTER — Encounter: Payer: 59 | Admitting: Psychology

## 2020-08-31 ENCOUNTER — Other Ambulatory Visit: Payer: Self-pay | Admitting: Medical

## 2020-09-01 DIAGNOSIS — Z0271 Encounter for disability determination: Secondary | ICD-10-CM

## 2020-09-06 ENCOUNTER — Encounter: Payer: 59 | Attending: Psychology | Admitting: Psychology

## 2020-09-06 ENCOUNTER — Other Ambulatory Visit: Payer: Self-pay

## 2020-09-06 ENCOUNTER — Encounter: Payer: Self-pay | Admitting: Psychology

## 2020-09-06 DIAGNOSIS — I69919 Unspecified symptoms and signs involving cognitive functions following unspecified cerebrovascular disease: Secondary | ICD-10-CM

## 2020-09-06 DIAGNOSIS — I63411 Cerebral infarction due to embolism of right middle cerebral artery: Secondary | ICD-10-CM | POA: Diagnosis not present

## 2020-09-06 DIAGNOSIS — F0630 Mood disorder due to known physiological condition, unspecified: Secondary | ICD-10-CM | POA: Diagnosis present

## 2020-09-06 DIAGNOSIS — R202 Paresthesia of skin: Secondary | ICD-10-CM | POA: Diagnosis not present

## 2020-09-06 DIAGNOSIS — I69398 Other sequelae of cerebral infarction: Secondary | ICD-10-CM

## 2020-09-06 NOTE — Progress Notes (Signed)
Neuropsychology Visit  Patient:  Tara [name]   DOB: Mar 25, 1966  MR Number: [id number]  Location: [facility] PHYSICAL MEDICINE AND REHABILITATION [address] [place], [address] [id number] [facility] [place] [address] Dept: [phone]  Date of Service: 09/06/2020  Start: 2 PM End: 3 PM  Today's visit was an in person visit that was conducted in my outpatient clinic office.  The patient and myself were present for this visit.  Duration of Service: 1 Hour  Provider/Observer:     Hershal Coria PsyD  Chief Complaint:      Chief Complaint  Patient presents with  . Cerebrovascular Accident  . Memory Loss  . Anxiety  . Depression    Reason For Service:      Tara A. Bartle is a 55 year old female referred by Ihor Austin, NP with [facility] for neuropsychological consultation.  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  The patient had a cerebrovascular accident on 07/29/2019 and is continued to have difficulties with adjustment and residual cognitive deficits and changes, changes in mood and residual left-handed paresthesias.  She has significant fear about stroke and other vascular events with personal experience needs of her father dying of a heart attack when he was 19 years old and her aunt recently dying of a stroke at 78 years old.  Treatment Interventions:  Cognitive behavioral and other coping strategies around adapting to residual effects of her CVA.  Participation Level:   Active  Participation Quality:  Appropriate      Behavioral Observation:  Well Groomed, Alert, and Appropriate.   Current Psychosocial Factors: The patient continues to struggle with residual effects of her symptoms from her CVA.  One of the biggest psychosocial factors is her husband having difficulty adjusting to her new limitations.  Because of her  changes in motor functioning it takes her much longer to complete various tasks both around the house as well as getting ready.  Her husband is constantly telling her to hurry up and other types of interactions that the patient perceives negatively and and heighten her anxiety and frustration about her own limitations.  Content of Session:   Reviewed current symptoms and continue to work on therapeutic interventions around coping and adjustment to late effects of her CVA.  Effectiveness of Interventions: Patient was fully engaged in rapport has been easily to establish.  The patient actively participates in therapeutic interventions and working on coping resources.  Target Goals:   Improved coping and adjustment issues around residual effects of her CVA.  Goals Last Reviewed:   09/06/2020  Goals Addressed Today:    We continue to work on coping and adjustment issues particular around some of the foundational issues as well as also the way she interprets changes she is experienced since her stroke and ways to better manage the impact it is having on self-esteem as well as coping with the impact it is having on her husband and his response to her.  Impression/Diagnosis:   Tara [name] is a 55 year old female referred by Ihor Austin, NP with [facility] for neuropsychological consultation.  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  The patient had a cerebrovascular accident on 07/29/2019 and is continued to have difficulties with adjustment and residual cognitive deficits and changes, changes in mood and residual left-handed paresthesias.  She has significant fear about stroke and other  vascular events with personal experience needs of her father dying of a heart attack when he was 75 years old and her aunt recently dying of a stroke at 29 years old.  The patient is clearly having adjustment difficulties with residual  effects of her cerebrovascular accident including significant anxiety and depression which are exacerbating residual effects of her cerebrovascular accident that occurred in March of this year.  The patient has not been able to return to work.  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 Clinical Psychologist Neuropsychologist

## 2020-09-13 ENCOUNTER — Ambulatory Visit (INDEPENDENT_AMBULATORY_CARE_PROVIDER_SITE_OTHER): Payer: 59

## 2020-09-13 DIAGNOSIS — I63411 Cerebral infarction due to embolism of right middle cerebral artery: Secondary | ICD-10-CM

## 2020-09-14 LAB — CUP PACEART REMOTE DEVICE CHECK
Date Time Interrogation Session: 20220417230433
Implantable Pulse Generator Implant Date: 20210303

## 2020-09-28 NOTE — Progress Notes (Signed)
[facility] Summary Report / Loop Recorder 

## 2020-09-30 NOTE — Addendum Note (Signed)
Addended by: Geralyn Flash D on: 09/30/2020 10:15 AM   Modules accepted: Level of Service

## 2020-10-18 ENCOUNTER — Ambulatory Visit (INDEPENDENT_AMBULATORY_CARE_PROVIDER_SITE_OTHER): Payer: 59

## 2020-10-18 DIAGNOSIS — I639 Cerebral infarction, unspecified: Secondary | ICD-10-CM | POA: Diagnosis not present

## 2020-10-19 LAB — CUP PACEART REMOTE DEVICE CHECK
Date Time Interrogation Session: 20220520230350
Implantable Pulse Generator Implant Date: 20210303

## 2020-11-04 ENCOUNTER — Encounter: Payer: 59 | Attending: Psychology | Admitting: Psychology

## 2020-11-04 ENCOUNTER — Other Ambulatory Visit: Payer: Self-pay

## 2020-11-04 DIAGNOSIS — I69398 Other sequelae of cerebral infarction: Secondary | ICD-10-CM | POA: Insufficient documentation

## 2020-11-04 DIAGNOSIS — I63411 Cerebral infarction due to embolism of right middle cerebral artery: Secondary | ICD-10-CM | POA: Insufficient documentation

## 2020-11-04 DIAGNOSIS — I69919 Unspecified symptoms and signs involving cognitive functions following unspecified cerebrovascular disease: Secondary | ICD-10-CM | POA: Diagnosis not present

## 2020-11-04 DIAGNOSIS — R202 Paresthesia of skin: Secondary | ICD-10-CM | POA: Diagnosis present

## 2020-11-04 DIAGNOSIS — F0630 Mood disorder due to known physiological condition, unspecified: Secondary | ICD-10-CM | POA: Insufficient documentation

## 2020-11-05 NOTE — Progress Notes (Signed)
[facility] Summary Report / Loop Recorder 

## 2020-11-12 ENCOUNTER — Ambulatory Visit
Admission: RE | Admit: 2020-11-12 | Discharge: 2020-11-12 | Disposition: A | Discharge status: Home / Self Care | Payer: 59 | Source: Ambulatory Visit | Attending: Family Medicine | Admitting: Family Medicine

## 2020-11-12 ENCOUNTER — Other Ambulatory Visit: Payer: Self-pay | Admitting: Family Medicine

## 2020-11-12 DIAGNOSIS — M25571 Pain in right ankle and joints of right foot: Secondary | ICD-10-CM

## 2020-11-12 DIAGNOSIS — M25471 Effusion, right ankle: Secondary | ICD-10-CM

## 2020-11-18 LAB — CUP PACEART REMOTE DEVICE CHECK
Date Time Interrogation Session: 20220622230616
Implantable Pulse Generator Implant Date: 20210303

## 2020-11-22 ENCOUNTER — Ambulatory Visit (INDEPENDENT_AMBULATORY_CARE_PROVIDER_SITE_OTHER): Payer: 59

## 2020-11-22 DIAGNOSIS — I63411 Cerebral infarction due to embolism of right middle cerebral artery: Secondary | ICD-10-CM | POA: Diagnosis not present

## 2020-12-08 NOTE — Progress Notes (Signed)
[facility] Summary Report / Loop Recorder 

## 2020-12-19 ENCOUNTER — Encounter: Payer: Self-pay | Admitting: Psychology

## 2020-12-19 NOTE — Progress Notes (Signed)
Neuropsychology Visit  Patient:  Tara [name]   DOB: [birth year]  MR Number: [id number]  Location: [facility] PHYSICAL MEDICINE AND REHABILITATION [address] [place], [address] [id number] Lorelie Biermann D. [facility] [place] [address] Dept: [phone]  Date of Service: 11/04/2020  Start: 3 PM End: 4 PM Today's visit was an in person visit that was conducted in my outpatient clinic office.  The patient and myself were present for this visit.  Duration of Service: 1 Hour  Provider/Observer:     Hershal Coria PsyD  Chief Complaint:      Chief Complaint  Patient presents with   Cerebrovascular Accident   Memory Loss   Depression   Anxiety    Reason For Service:      Tara [name] is a 55 year old female referred by Ihor Austin, NP with [facility] for neuropsychological consultation.  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  The patient had a cerebrovascular accident on 07/29/2019 and is continued to have difficulties with adjustment and residual cognitive deficits and changes, changes in mood and residual left-handed paresthesias.  She has significant fear about stroke and other vascular events with personal experience needs of her father dying of a heart attack when he was 49 years old and her aunt recently dying of a stroke at 45 years old.  Treatment Interventions:  Cognitive behavioral and other coping strategies around adapting to residual effects of her CVA.  Participation Level:   Active  Participation Quality:  Appropriate      Behavioral Observation:  Well Groomed, Alert, and Appropriate.   Current Psychosocial Factors: The patient continues to struggle with residual effects of her symptoms from her CVA.  One of the biggest psychosocial factors is her husband having difficulty adjusting to her new limitations.  Because of her changes in  motor functioning it takes her much longer to complete various tasks both around the house as well as getting ready.  Her husband is constantly telling her to hurry up and other types of interactions that the patient perceives negatively and and heighten her anxiety and frustration about her own limitations.  Content of Session:   Reviewed current symptoms and continue to work on therapeutic interventions around coping and adjustment to late effects of her CVA.  Effectiveness of Interventions: Patient was fully engaged in rapport has been easily to establish.  The patient actively participates in therapeutic interventions and working on coping resources.  Target Goals:   Improved coping and adjustment issues around residual effects of her CVA.  Goals Last Reviewed:   09/06/2020  Goals Addressed Today:    We continue to work on coping and adjustment issues particular around some of the foundational issues as well as also the way she interprets changes she is experienced since her stroke and ways to better manage the impact it is having on self-esteem as well as coping with the impact it is having on her husband and his response to her.  Impression/Diagnosis:   Tara [name] is a 55 year old female referred by Ihor Austin, NP with [facility] for neuropsychological consultation.  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  The patient had a cerebrovascular accident on 07/29/2019 and is continued to have difficulties with adjustment and residual cognitive deficits and changes, changes in mood and residual left-handed paresthesias.  She has significant fear about stroke and other vascular  events with personal experience needs of her father dying of a heart attack when he was 92 years old and her aunt recently dying of a stroke at 21 years old.   The patient is clearly having adjustment difficulties with residual effects of  her cerebrovascular accident including significant anxiety and depression which are exacerbating residual effects of her cerebrovascular accident that occurred in March of this year.  The patient has not been able to return to work.  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 Clinical Psychologist Neuropsychologist

## 2020-12-23 ENCOUNTER — Ambulatory Visit (INDEPENDENT_AMBULATORY_CARE_PROVIDER_SITE_OTHER): Payer: 59

## 2020-12-23 DIAGNOSIS — I63411 Cerebral infarction due to embolism of right middle cerebral artery: Secondary | ICD-10-CM | POA: Diagnosis not present

## 2020-12-23 LAB — CUP PACEART REMOTE DEVICE CHECK
Date Time Interrogation Session: 20220725230306
Implantable Pulse Generator Implant Date: 20210303

## 2020-12-29 ENCOUNTER — Telehealth: Payer: Self-pay | Admitting: *Deleted

## 2020-12-29 NOTE — Telephone Encounter (Signed)
Form received, on 07/07/20 pt reported to [name] that PCP would be taking care of this so jessica doesn't want to overstep and advised for PCP to continue this report.  Form given back to Candi Leash, medical records.

## 2020-12-29 NOTE — Telephone Encounter (Signed)
Pt [name] form in Nurse pod

## 2021-01-04 ENCOUNTER — Ambulatory Visit (INDEPENDENT_AMBULATORY_CARE_PROVIDER_SITE_OTHER): Payer: 59 | Admitting: Adult Health

## 2021-01-04 ENCOUNTER — Other Ambulatory Visit: Payer: Self-pay | Admitting: Family Medicine

## 2021-01-04 ENCOUNTER — Other Ambulatory Visit: Payer: Self-pay

## 2021-01-04 ENCOUNTER — Encounter: Payer: Self-pay | Admitting: Adult Health

## 2021-01-04 VITALS — BP 114/74 | HR 88 | Ht 64.0 in | Wt 161.6 lb

## 2021-01-04 DIAGNOSIS — G5603 Carpal tunnel syndrome, bilateral upper limbs: Secondary | ICD-10-CM

## 2021-01-04 DIAGNOSIS — I639 Cerebral infarction, unspecified: Secondary | ICD-10-CM

## 2021-01-04 DIAGNOSIS — Q211 Atrial septal defect: Secondary | ICD-10-CM

## 2021-01-04 DIAGNOSIS — G4733 Obstructive sleep apnea (adult) (pediatric): Secondary | ICD-10-CM

## 2021-01-04 DIAGNOSIS — Q2112 Patent foramen ovale: Secondary | ICD-10-CM

## 2021-01-04 NOTE — Patient Instructions (Signed)
Continue aspirin 81 mg daily  and Crestor 20mg  daily  for secondary stroke prevention  Continue to follow up with PCP regarding cholesterol and blood pressure management  Maintain strict control of hypertension with blood pressure goal below 130/90 and cholesterol with LDL cholesterol (bad cholesterol) goal below 70 mg/dL.   We will follow up regarding CPAP for sleep apnea management  Continue to follow with orthopedics for carpal tunnel syndrome       Followup in the future with me in 6 months or call earlier if needed       Thank you for coming to see at [facility]. I hope we have been able to provide you high quality care today.  You may receive a patient satisfaction survey over the next few weeks. We would appreciate your feedback and comments so that we may continue to improve ourselves and the health of our patients.

## 2021-01-04 NOTE — Progress Notes (Signed)
[facility] [address] [place] [address] [phone]       STROKE FOLLOW UP NOTE  [name] Date of Birth:  May 27, 1966 Medical Record Number:  [id number]   Reason for Referral: stroke follow up    CHIEF COMPLAINT:  Chief Complaint  Patient presents with   Stroke    Rm 3, 6 month FU "loop recorder implanted;  movements and thought processes are still slow, I forget what I am doing, can't go in sequence to compkete tasks"      HPI:   Today, 12/27/2020, [name] returns for 54-monthroutine follow-up previously seen on 07/06/2020.  Overall stable.  Reports residual cognitive and gait impairment.  She remains in the process of applying for Social Security disability and currently on long-term disability through her former employer managed by PCP.  She continues to follow with Dr. 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  Denies new stroke/TIA symptoms.  Compliant on aspirin and Crestor without associated side effects.  Blood pressure today 114/74.  Loop recorder has not shown atrial fibrillation thus far.  She has not yet received her CPAP machine reports previously being contacted by DME in April and was told they were still on back order and would be called when available.  Continues to follow with orthopedics for bilateral carpal tunnel and currently getting a quote from office for price of procedure. No new concerns at this time.    History provided for reference purposes only Update 07/06/2020 JM: [name] for 54-monthtroke follow-up.  Overall stable from stroke standpoint without new stroke/TIA symptoms and reports residual cognitive impairment and gait impairment. Use of cane at times - denies any recent falls.she also continues to experience anxiety and depression poststroke and has been followed by Dr. RoSima Matashich she believes has been beneficial.   She remains on long term disability and still in the process of applying for social security disability. Loop recorder has not shown atrial fibrillation thus far.    Underwent sleep study due to continued complaints of excessive daytime fatigue which was completed on 04/12/2020 which showed evidence of sleep apnea and recommend initiation of CPAP.   She is currently awaiting CPAP machine as this has been on back order.   She also underwent PFO closure successfully without complication on 1125/49/8264nd recommended aspirin and Plavix for 6-73-monthration. She has remained on aspirin and plavix but does report increased bruising therefore started to take aspirin 12m45mery other day and has continued on plavix daily - denies any concerning bleeding.  She has not further discuss this with cardiology.  Complains of left hand numbness with EMG/NCV completed 03/17/2020 to further evaluate for possible underlying carpal tunnel or post stroke deficit.  Study showed bilateral moderately to severe L>R carpal tunnel syndrome therefore referral placed to Ortho for further treatment options.  They plan on proceeding with release procedure but currently holding until she is able to come off Plavix (around 09/2020). She did receive cortisone injections with improvement of severe pain (not waking her up at night) but continues to feel numbness. She also uses braces at night time as instructed by ortho.   She also reports episode last night of waking up with substernal pressure.  This pain did not radiate and denies any shortness of breath, nausea/vomiting, diaphoresis or palpitations.  Symptoms improved after she propped 3 pillows behind her - reports lasting for approximately 3  hours.  She denies any reoccurrence or prior symptoms.  Update 03/01/2020 JM: [name] returns for 54-monthstroke follow-up unaccompanied.  Residual deficits of cognitive impairment, dysarthria, imbalance and left hand weakness and numbness as well  as continued excessive fatigue.  She recently completed speech therapy as she met stated rehab goals but due to continued cognitive impairment and poststroke anxiety/depression, she has initial evaluation with Dr. RSima Matas10/13.  Cognition impairment with visual difficulty with high-level functioning, short-term recall, attention, processing and visual-spatial skills.  She also feels overwhelmed in crowded areas such as grocery store or needing to complete a more complex task.  She does report improvement of dizziness/imbalance with less frequency as before - will continue to feel with quick head movements and bending over.  Left hand paresthesias intermittent typically occurring with increased use of left hand or the middle of the night with burning sensation, numbness/tingling and "achy" feeling.  Symptoms present 3rd and 4th digit of left hand radiating in the middle of her wrist and stopping 1-2" below elbow.  Excessive daytime fatigue persists despite sleeping well through the night.  Does report occasional snoring.  She remains on long term disability and in the process of completing social security disability.  Remains on aspirin and Crestor for secondary stroke prevention without side effects.  Blood pressure today 119/84.  Loop recorder has not shown atrial fibrillation thus far.  No further concerns at this time.  Update 11/18/2019 JM: Ms. AMontalvoreturns for follow-up regarding cryptogenic right parietal stroke in 54/2021.  Residual deficits of mild cognitive impairment, left hand weakness, fatigue with decreased activity tolerance and dizziness/imbalance.  Continues to participate in speech therapy for impairments of attention, processing and visual-spatial skills as well as dysarthria with reported ongoing improvement.  Continues to work with PT for gait training, environmental scanning, and balance and dizziness progressing towards goals.  Continues to work with OT for improvement of LUE fine motor  control and coordination and environmental standing/dynamic balance.  She does report ongoing difficulty concentrating especially when having multiple tasks to complete.  Also reports difficulty going into crowded places such as stores as she feels visually overstimulated and starts to feel anxious.  She denies anxiety or depression but based on PHQ-9 of 16 likely underlying depression.  Becomes tearful during visit as she is frustrated with ongoing deficits and feels as though she should have recovered more than what she has.  Reports left hand numbness/tingling located left middle and ring finger with radiation to elbow can wake her up in the middle of the night or become present with increased hand use. 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  Remains on aspirin and Crestor for secondary stroke prevention. Recent lipid panel by PCP showed LDL 53.  Blood pressure today 113/78.  Loop recorder is not shown atrial fibrillation thus far.  No further concerns at this time.  Update 09/29/2019 Dr. SLeonie Man; patient is seen upon request that she call the office requesting medical leave be prolonged as she is having cognitive difficulties and fatigue and dizziness.  She states that she has been getting outpatient physical occupational therapy and but complains of a transient positional dizziness when she looks down or turns her neck quickly.  She is been getting some vestibular therapy which has been helping.  She was recently referred to speech therapy for mental status slowness and progressing with appointment has not happened yet.  She feels overall there is slow and steady progress but she is not ready to return to work yet.  She wants to be out of work till her next assessment with her primary physician Dr. Jacelyn Grip which is scheduled for June 6.  She has not had any recurrent stroke or TIA symptoms.  She does complain of some intermittent numbness  and tightness in her middle 2 fingers in the left hand particularly when she is tired.  She is tolerating aspirin well without bruising or bleeding.  Blood pressures well controlled today it is 116/77.  She had to reduce the dose of Crestor to 20 mg that she had some muscle aches and pains which appear not to have improved.  She does take coenzyme Q 10 as well.  She denies any headache, slurred speech, increasing gait or balance problems.  Initial visit 08/13/2019 JM: Ms. Cirrincione is a 55 year old female who is being seen today for hospital follow-up.  Residual stroke deficits of mild left-sided weakness, dizziness with quick head movements and unsteadiness on feet. She does endorse some improvement but recently just started therapies. She does endorse increased fatigue since discharge and feels like her mind is slower/delayed.  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  She initially had FMLA which expired on 3/15 and she is requesting for extension.  She is eager to continue with therapies for ongoing improvement.  She continues on DAPT but will be completing 3-week Plavix course in the near future.  Denies bleeding or bruising.  She continues on Crestor 40 mg daily but does endorse myalgias bilateral upper extremities and torso.  Blood pressure today 118/88.  Loop recorder has not shown atrial fibrillation thus far.  No further concerns at this time.  Stroke admission 07/29/2019: Ms. ELIZET KAPLAN is a 55 y.o. female with history of HLD, migraine and asthma who presented with HA since 2/28 who woke w/ LUE numbness and weakness, severe dysarthria and dysphagia on 07/29/2019.  Evaluated by stroke team and Dr. Leonie Man with stroke work-up revealing 2 tiny right parietal infarcts embolic secondary to unknown source.  In addition to acute infarcts, MRI also showed several small chronic infarcts in the cerebellum but per review, Dr Leonie Man these were likely flow-voids and not old  strokes.  TEE did not show evidence of PFO.  Recommended further evaluation with TCD which was suggestive of medium to large PFO.  Recommended further discussion outpatient regarding possible PFO closure.  Loop recorder placed to rule out atrial fibrillation as possible cause of stroke.  Hypercoagulable work-up negative.  Recommended DAPT for 3 weeks and aspirin alone.  LDL 151 and recommended increasing Crestor frequency to 40 mg daily.  No history of HTN or DM.  Other stroke risk factors include migraines but no prior history of stroke.  Discharged home in stable condition without therapy needs.  Stroke:   2 tiny R parietal  infarcts embolic secondary to unknown source.Marland Kitchen PFO unclear if related to her stroke ROPE score 6 ) 62% chance pfo is related to stroke ) Code Stroke CT head hyperdense R MCA bifurcation. No acute abnormality. R maxillary sinus dz. ASPECTS 10.    CTA head & neck no LVO. Mild distal SMALL VESSEL DISEASE anterior and posterior circulation. Neck ok  CT perfusion Unremarkable  MRI  2 tiny acute cortical R brain (posterior middle frontal gyrus and posterior R parietal lobe). Read as old cerebellar infarcts but  Dr. Leonie Man  feels they are flow voids and not old stroke. Mild paranasal sinus dz.  2D Echo normal  TEE  PFO present EEG normal  LE dopplers no DVT  LDL 151 mg% HgbA1c 5.3 UDS not done  TSH normal    ROS:   14 system review of systems performed and negative with exception of those listed in HPI      PMH:  Past Medical History:  Diagnosis Date   Asthma    Hyperlipidemia    Migraine    Rotator cuff tear    Spondylolisthesis    L4-5    PSH:  Past Surgical History:  Procedure Laterality Date   BUBBLE STUDY  07/30/2019   Procedure: BUBBLE STUDY;  Surgeon: Donato Heinz, MD;  Location: [facility] ENDOSCOPY;  Service: Cardiovascular;;   LOOP RECORDER INSERTION N/A 07/30/2019   Procedure: LOOP RECORDER INSERTION;  Surgeon: Evans Lance, MD;  Location: [place] [facility] CV LAB;  Service: Cardiovascular;  Laterality: N/A;   No prior surgery     PATENT FORAMEN OVALE(PFO) CLOSURE N/A 04/19/2020   Procedure: PATENT FORAMEN OVALE (PFO) CLOSURE;  Surgeon: Sherren Mocha, MD;  Location: [facility] CV LAB;  Service: Cardiovascular;  Laterality: N/A;   TEE WITHOUT CARDIOVERSION N/A 07/30/2019   Procedure: TRANSESOPHAGEAL ECHOCARDIOGRAM (TEE);  Surgeon: Donato Heinz, MD;  Location: [facility] ENDOSCOPY;  Service: Cardiovascular;  Laterality: N/A;    Social History:  Social History   Socioeconomic History   Marital status: Married    Spouse name: Not on file   Number of children: 2   Years of education: BSN   Highest education level: Not on file  Occupational History   Occupation: [employer]  Tobacco Use   Smoking status: Never   Smokeless tobacco: Never  Substance and Sexual Activity   Alcohol use: No    Alcohol/week: 0.0 standard drinks    Comment: Rare   Drug use: No   Sexual activity: Yes    Birth control/protection: None  Other Topics Concern   Not on file  Social History Narrative   Lives at home w/ her husband and son   Right-handed   Caffeine: seldom, soda on the weekend   Social Determinants of Health   Financial Resource Strain: Not on file  Food Insecurity: Not on file  Transportation Needs: Not on file  Physical Activity: Not on file  Stress: Not on file  Social Connections: Not on file  Intimate Partner Violence: Not on file    Family History:  Family History  Problem Relation Age of Onset   Heart disease Father 59       MI   Hypertension Maternal Grandmother    Hyperlipidemia Brother     Medications:   Current Outpatient Medications on File Prior to Visit  Medication Sig Dispense Refill   amoxicillin (AMOXIL) 500 MG capsule TAKE 4 CAPSULES BY MOUTH 60 MINUTES BEFORE ANY DENTAL PROCEDURE. 12 capsule 2   aspirin EC 81 MG EC tablet Take 1 tablet (81 mg total) by mouth daily. (Patient taking differently: Take  81 mg by mouth every other day. Went to every other day starting in January d/t bruising)     cholecalciferol (VITAMIN D) 1000 UNITS tablet Take 1,000 Units by mouth daily.     Coenzyme Q10 (COQ10 PO) Take 1 tablet by mouth daily.      COVID-19 mRNA vaccine, Moderna, 100 MCG/0.5ML injection INJECT AS DIRECTED .25 mL 0   Omega-3 Fatty Acids (FISH OIL) 500 MG CAPS  Natures made Fish oil pearls 534m BID 180 capsule 1   Probiotic Product (PROBIOTIC PO) Take 1 capsule by mouth daily.      rizatriptan (MAXALT) 10 MG tablet Take 10 mg by mouth as needed for migraine (headache).      rosuvastatin (CRESTOR) 20 MG tablet TAKE 1 TABLET BY MOUTH ONCE A DAY 90 tablet 1   scopolamine (TRANSDERM-SCOP) 1 MG/3DAYS Place 1 patch onto the skin daily as needed (Motion sickness).      No current facility-administered medications on file prior to visit.    Allergies:   Allergies  Allergen Reactions   Crestor [Rosuvastatin] Other (See Comments)    Muscle pain with 40 mg   Cyclobenzaprine Other (See Comments)    "loopy"/ Can take 0.5 dose   Tape Rash    adhesive     Physical Exam  Vitals:   01/04/21 1049  BP: 114/74  Pulse: 88  Weight: 161 lb 9.6 oz (73.3 kg)  Height: 5' 4"  (1.626 m)    Body mass index is 27.74 kg/m. No results found.  General: well developed, well nourished, pleasant middle-age female, seated, in no evident distress Head: head normocephalic and atraumatic.   Neck: supple with no carotid or supraclavicular bruits Cardiovascular: regular rate and rhythm, no murmurs Musculoskeletal: no deformity Skin:  no rash/petichiae Vascular:  Normal pulses all extremities   Neurologic Exam Mental Status: Awake and fully alert.   Occasional decreased volume with occasional speech hesitancy.  Oriented to place and time. Recent memory impaired and remote memory intact. Attention span, concentration and fund of knowledge appropriate during visit. Mood and affect appropriate.   Cranial Nerves:   Pupils equal, briskly reactive to light. Extraocular movements full without nystagmus. Visual fields full to confrontation. Hearing intact. Decreased sensory left lower face. Left nasolabial fold flatting. tongue, palate moves normally and symmetrically.  Motor: Normal bulk and tone. Normal strength in all tested extremity muscles except mild diminished fine finger movements on the left  Sensory.: slight decreased light touch, pinprick and vibratory sensation LUE and LLE compared to right side Coordination: Rapid alternating movements normal in all extremities except decreased left hand. Finger-to-nose and heel-to-shin performed accurately bilaterally. Gait and Station: Arises from chair without difficulty. Stance is normal. Gait demonstrates normal stride length with mild imbalance and unsteadiness and use of cane Reflexes: 1+ and symmetric. Toes downgoing.        ASSESSMENT/PLAN: LTishara Pizanois a 55y.o. year old female presented with LUE numbness and weakness along with severe dysarthria and dysphagia on 07/29/2019 with stroke work-up revealing 2 tiny right parietal infarcts embolic secondary to unknown source s/p loop recorder as well as evidence of PFO.  Vascular risk factors include HLD and migraines.      R parietal strokes, cryptogenic -Residual deficits: Higher level cognitive impairment, imbalance, decreased left hand dexterity and post stroke anxiety/depression.  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 Currently on long-term disability through PCP and in the process of applying for Social Security disability.  -Loop recorder has not shown atrial fibrillation thus far -continue monthly monitoring by cardiology -Continue aspirin and Crestor 20 mg daily for stroke prevention -maintain aggressive risk factor modification with PCP with strict control of hypertension blood pressure  goal below 130/90 and lipids with LDL cholesterol goal below 70 mg percent  PFO -s/p successful closure 04/19/2020 -2D echo 06/23/2020 no evidence of PFO device  closure leak -Continue to follow with cardiology  Depression/anxiety, post stroke Adjustment disorder, poststroke -Established care with Dr. Sima Matas which she has been experiencing great benefit  Sleep apnea, new dx -Sleep study 04/12/2020 mild OSA with AHI 15/h with supine 27.3/h and REM sleep 46.8/h with sleep hypoxemia prolonged at 53 minutes with nadir at 75% -recommend initiating CPAP -will request to reach out to DME [facility] regarding machine and back order status - advised patient she will be updated. Once she receives the machine, advised to call office to schedule follow-up visit 30 to 90 days for initial compliance visit  B/l carpal tunnel syndrome -c/o L hand numbness/tingling, pain and weakness -EMG/NCV 03/25/2020 moderate to severe L>R carpal tunnel syndrome -follows with ortho - currently looking into finances prior to undergoing release surgery      Follow-up in 6 months or call earlier if needed   CC:  [facility] provider: Dr. Timothy Lasso, Edwyna Shell, MD    I spent 39 minutes of face-to-face and non-face-to-face time with patient.  This included previsit chart review, lab review, study review, order entry, electronic health record documentation, patient education regarding cryptogenic stroke, residual deficits and poststroke depression, s/p PFO closure, sleep apnea and use of CPAP, evidence of bilateral carpal tunnel syndrome, importance of managing stroke risk factors and answered all other questions to patient satisfaction  Frann Rider, AGNP-BC  [facility] [address] [place], [place] [address]  Phone [phone] Fax [phone] Note: This document was prepared with digital dictation and possible smart phrase technology. Any transcriptional errors that result from this  process are unintentional.

## 2021-01-10 NOTE — Progress Notes (Signed)
I agree with the above plan 

## 2021-01-11 ENCOUNTER — Telehealth: Payer: Self-pay

## 2021-01-11 NOTE — Telephone Encounter (Signed)
Received this message from [facility] about set up date for PAP.  [name], RN Patient is scheduled for 01/14/2021

## 2021-01-18 NOTE — Progress Notes (Signed)
[facility] Summary Report / Loop Recorder 

## 2021-01-20 ENCOUNTER — Other Ambulatory Visit: Payer: Self-pay | Admitting: Family Medicine

## 2021-01-20 DIAGNOSIS — Z1231 Encounter for screening mammogram for malignant neoplasm of breast: Secondary | ICD-10-CM

## 2021-01-25 ENCOUNTER — Ambulatory Visit (INDEPENDENT_AMBULATORY_CARE_PROVIDER_SITE_OTHER): Payer: 59

## 2021-01-25 DIAGNOSIS — I63411 Cerebral infarction due to embolism of right middle cerebral artery: Secondary | ICD-10-CM | POA: Diagnosis not present

## 2021-01-25 LAB — CUP PACEART REMOTE DEVICE CHECK
Date Time Interrogation Session: 20220827230723
Implantable Pulse Generator Implant Date: 20210303

## 2021-02-07 NOTE — Progress Notes (Signed)
[facility] Summary Report / Loop Recorder 

## 2021-02-18 ENCOUNTER — Other Ambulatory Visit: Payer: Self-pay

## 2021-02-18 ENCOUNTER — Ambulatory Visit
Admission: RE | Admit: 2021-02-18 | Discharge: 2021-02-18 | Disposition: A | Discharge status: Home / Self Care | Payer: 59 | Source: Ambulatory Visit | Attending: Family Medicine | Admitting: Family Medicine

## 2021-02-18 DIAGNOSIS — Z1231 Encounter for screening mammogram for malignant neoplasm of breast: Secondary | ICD-10-CM

## 2021-02-21 DIAGNOSIS — Z0271 Encounter for disability determination: Secondary | ICD-10-CM

## 2021-02-26 IMAGING — MR MR HEAD W/O CM
11 of 13 series · 36 of 48 positions shown · non-contrast
Comparison: 07/29/2019 CT and MRI.

CLINICAL DATA: Transient vision disturbance.

EXAM:
MRI HEAD WITHOUT CONTRAST
TECHNIQUE: Multiplanar, multiecho pulse sequences of the brain and surrounding
structures were obtained without intravenous contrast.

[Series 5: DWI · axial · 3.0mm · 0.88mm/px · z∈[-117,+37]mm · 6 of 108 slices shown (1 of 4)]
[im 1/108]
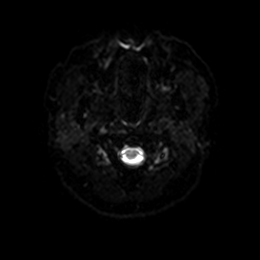
[im 22/108]
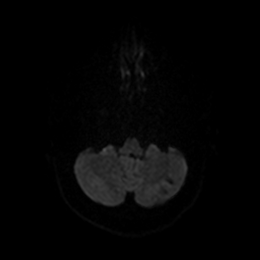
[im 43/108]
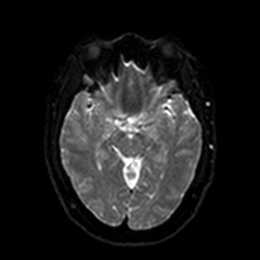
[im 65/108]
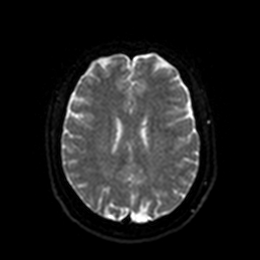
[im 86/108]
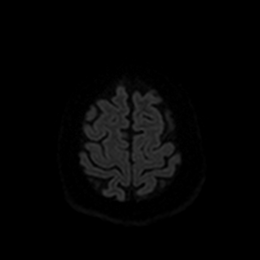
[im 108/108]
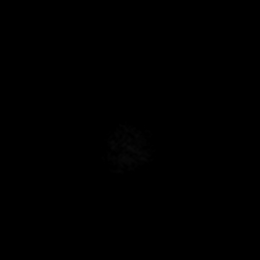

[Series 6: DWI · axial · 3.0mm · 0.88mm/px · z∈[-117,+37]mm · 4 of 54 slices shown (2 of 4)]
[im 1/54]
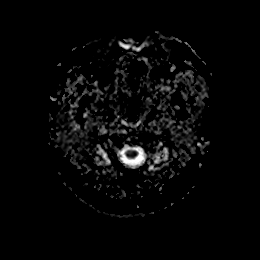
[im 18/54]
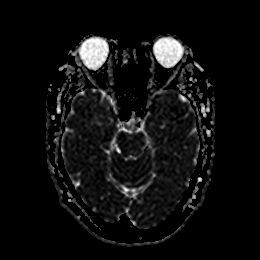
[im 36/54]
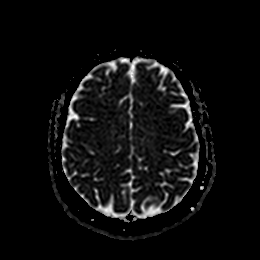
[im 54/54]
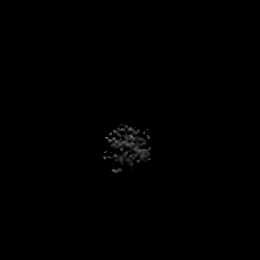

[Series 7: DWI · coronal · 4.0mm · 0.88mm/px · 5 of 72 slices shown (3 of 4)]
[im 1/72]
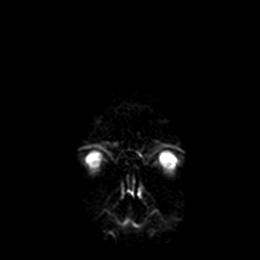
[im 18/72]
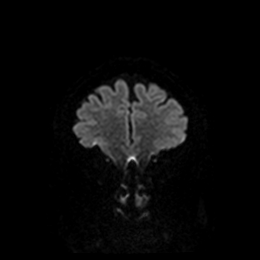
[im 36/72]
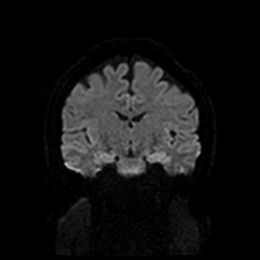
[im 54/72]
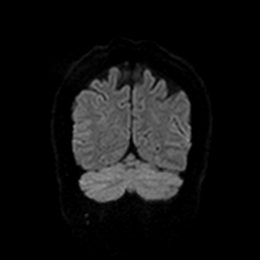
[im 72/72]
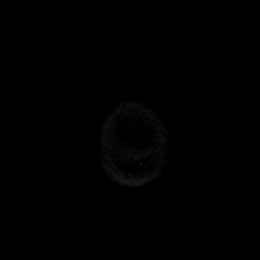

[Series 8: DWI · coronal · 4.0mm · 0.88mm/px · 2 of 36 slices shown (4 of 4)]
[im 1/36]
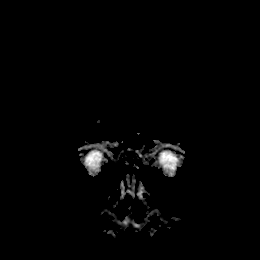
[im 36/36]
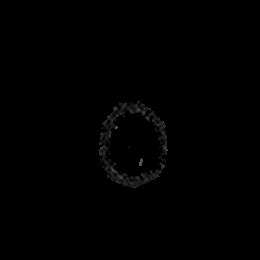

[Series 9: T1 · sagittal · 5.0mm · 0.75mm/px · 2 of 23 slices shown]
[im 1/23]
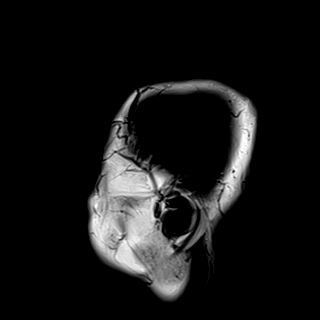
[im 23/23]
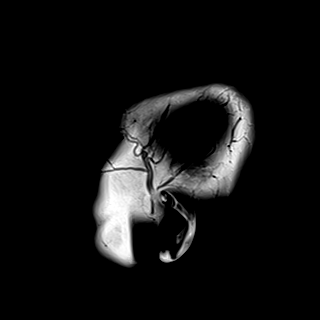

[Series 10: T2 · axial · 5.0mm · 0.72mm/px · z∈[-114,+37]mm · 2 of 27 slices shown (1 of 2)]
[im 1/27]
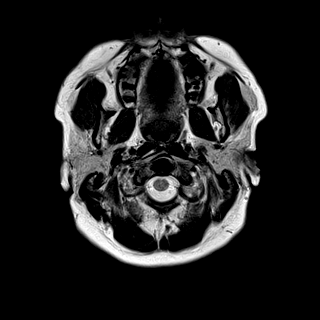
[im 27/27]
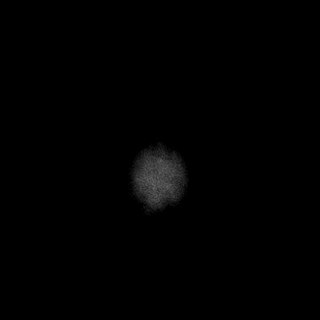

[Series 11: FLAIR · axial · 5.0mm · 0.45mm/px · z∈[-114,+37]mm · 2 of 27 slices shown]
[im 1/27]
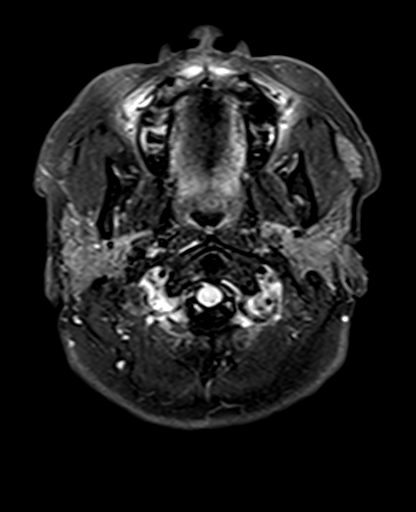
[im 27/27]
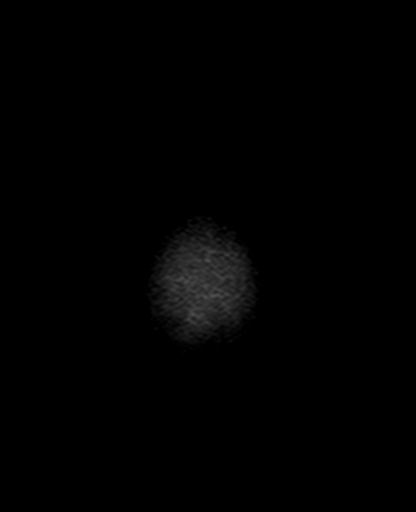

[Series 12: mag_images · axial · 3.0mm · 0.90mm/px · z∈[-115,+33]mm · 4 of 52 slices shown]
[im 1/52]
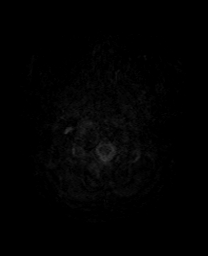
[im 18/52]
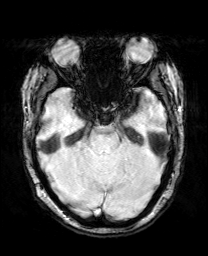
[im 35/52]
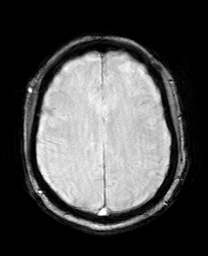
[im 52/52]
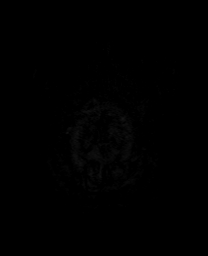

[Series 13: pha_images · axial · 3.0mm · 0.90mm/px · z∈[-112,+30]mm · 3 of 50 slices shown]
[im 1/50]
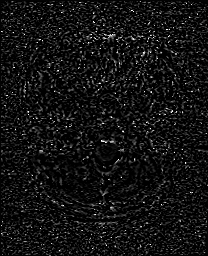
[im 25/50]
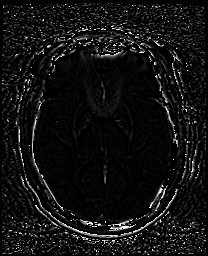
[im 50/50]
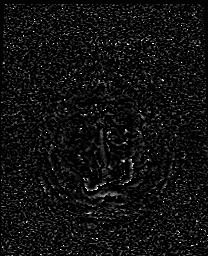

[Series 14: swi_images · axial · 3.0mm · 0.90mm/px · z∈[-115,+33]mm · 4 of 52 slices shown]
[im 1/52]
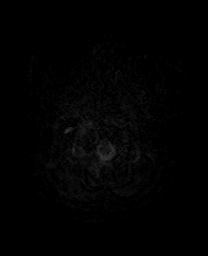
[im 18/52]
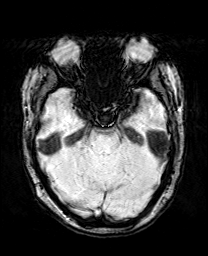
[im 35/52]
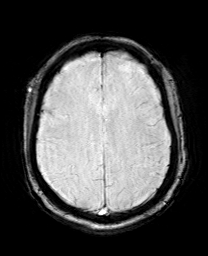
[im 52/52]
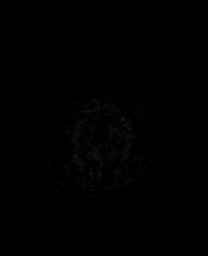

[Series 17: T2 · coronal · 5.0mm · 0.34mm/px · 2 of 31 slices shown (2 of 2)]
[im 1/31]
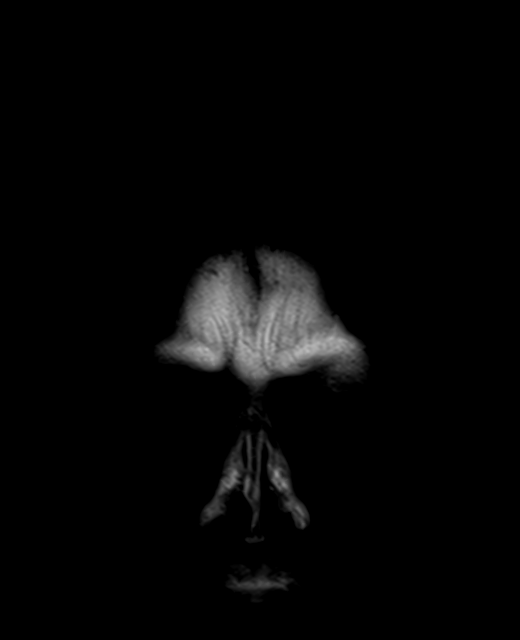
[im 31/31]
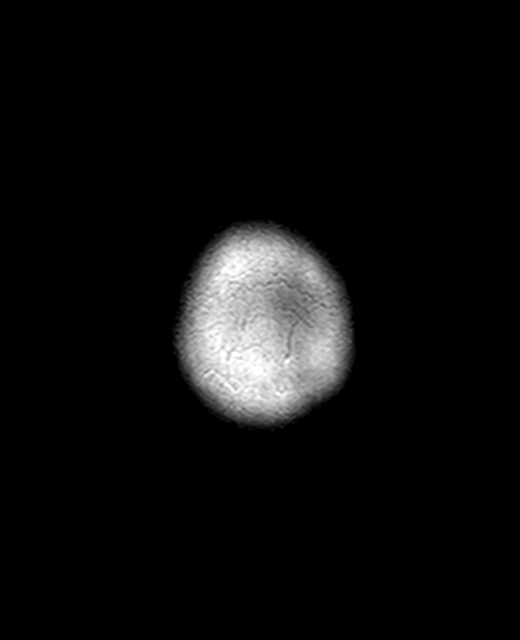

[36 of 48 positions shown; findings below may reference images not displayed]

FINDINGS: Brain: Diffusion imaging does not show any acute or subacute
infarction. No brainstem abnormality is seen. There are definite old
small vessel infarctions affecting the cerebellum, more numerous on
the left than the right. Cerebral hemispheres do not show a pattern
of chronic small vessel disease of the white matter. Tiny acute
cortical infarctions in the right posterior frontal and parietal
region shown on 07/29/2019 no longer show restricted diffusion. The
only sign presently is a very tiny focus of cortical FLAIR signal at
the site of the parietal insult as marked with an arrow. No new
insult. No hemorrhage, hydrocephalus or extra-axial collection.

Vascular: Major vessels at the base of the brain show flow.

Skull and upper cervical spine: Negative

Sinuses/Orbits: Clear/normal

Other: None
IMPRESSION: No acute or subacute finding.

Old small vessel cerebellar infarctions, more numerous on the left
than the right.

Resolution of the restricted diffusion seen associated with 2 tiny
cortical infarctions in the right posterior frontal and parietal
region which were acute on 07/29/2019. Very minimal focus of
cortical FLAIR signal seen at the site of the previous parietal
insult as marked with an arrow.

## 2021-02-28 ENCOUNTER — Ambulatory Visit (INDEPENDENT_AMBULATORY_CARE_PROVIDER_SITE_OTHER): Payer: 59

## 2021-02-28 DIAGNOSIS — I63411 Cerebral infarction due to embolism of right middle cerebral artery: Secondary | ICD-10-CM | POA: Diagnosis not present

## 2021-02-28 LAB — CUP PACEART REMOTE DEVICE CHECK
Date Time Interrogation Session: 20220929230730
Implantable Pulse Generator Implant Date: 20210303

## 2021-03-07 NOTE — Progress Notes (Signed)
[facility] Summary Report / Loop Recorder 

## 2021-03-15 DIAGNOSIS — Q2112 Patent foramen ovale: Secondary | ICD-10-CM

## 2021-03-17 ENCOUNTER — Encounter: Payer: 59 | Attending: Psychology | Admitting: Psychology

## 2021-03-17 ENCOUNTER — Other Ambulatory Visit: Payer: Self-pay

## 2021-03-17 ENCOUNTER — Encounter: Payer: Self-pay | Admitting: Psychology

## 2021-03-17 DIAGNOSIS — I63411 Cerebral infarction due to embolism of right middle cerebral artery: Secondary | ICD-10-CM | POA: Insufficient documentation

## 2021-03-17 DIAGNOSIS — F0630 Mood disorder due to known physiological condition, unspecified: Secondary | ICD-10-CM | POA: Diagnosis present

## 2021-03-17 DIAGNOSIS — I69398 Other sequelae of cerebral infarction: Secondary | ICD-10-CM | POA: Diagnosis present

## 2021-03-17 DIAGNOSIS — R202 Paresthesia of skin: Secondary | ICD-10-CM | POA: Insufficient documentation

## 2021-03-17 DIAGNOSIS — I69919 Unspecified symptoms and signs involving cognitive functions following unspecified cerebrovascular disease: Secondary | ICD-10-CM | POA: Insufficient documentation

## 2021-03-17 NOTE — Progress Notes (Signed)
Neuropsychology Visit  Patient:  Tara [name]   DOB: 1965/09/01  MR Number: [id number]  Location: [facility] FOR PAIN AND [facility] MEDICINE [facility] PHYSICAL MEDICINE AND REHABILITATION [address] [place], [phone] [facility] [address]  [place] [address] Dept: [phone]  Date of Service: 03/17/2021  Start: 2 PM End: 3 PM Today's visit was an in person visit that was conducted in my outpatient clinic office.  The patient and myself were present for this visit.  Duration of Service: 1 Hour  Provider/Observer:     Hershal Coria PsyD  Chief Complaint:      Chief Complaint  Patient presents with   Cerebrovascular Accident   Memory Loss   Anxiety   Depression    Reason For Service:      Tara [name] is a 55 year old female referred by Ihor Austin, NP with [facility] for neuropsychological consultation.  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  The patient had a cerebrovascular accident on 07/29/2019 and is continued to have difficulties with adjustment and residual cognitive deficits and changes, changes in mood and residual left-handed paresthesias.  She has significant fear about stroke and other vascular events with personal experience needs of her father dying of a heart attack when he was 38 years old and her aunt recently dying of a stroke at 48 years old.  Treatment Interventions:  Cognitive behavioral and other coping strategies around adapting to residual effects of her CVA.  Participation Level:   Active  Participation Quality:  Appropriate      Behavioral Observation:  Well Groomed, Alert, and Appropriate.   Current Psychosocial Factors: The patient reports that she still struggles when she is around people that have been on her before her stroke and she feels like they are noticing identifying changes in cognition and motor functioning since her  stroke.  She reports that it takes her longer to do many of her day-to-day activities and while she is getting more familiar in her routine and it is not surprising her at this point it still creates an emotional response for her.  Content of Session:   Reviewed current symptoms and continue to work on therapeutic interventions around coping and adjustment to late effects of her CVA.  Effectiveness of Interventions: Patient was fully engaged in rapport has been easily to establish.  The patient actively participates in therapeutic interventions and working on coping resources.  Target Goals:   Improved coping and adjustment issues around residual effects of her CVA.  Goals Last Reviewed:   03/17/2021  Goals Addressed Today:    We continue to work on coping and adjustment issues particular around some of the foundational issues as well as also the way she interprets changes she is experienced since her stroke and ways to better manage the impact it is having on self-esteem as well as coping with the impact it is having on her husband and his response to her.  Impression/Diagnosis:   Tara [name] is a 55 year old female referred by Ihor Austin, NP with [facility] for neuropsychological consultation.  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  The patient had a cerebrovascular accident on 07/29/2019 and is continued to have difficulties with adjustment and residual cognitive deficits and changes, changes in mood and residual left-handed paresthesias.  She has significant fear about stroke and other vascular events with personal experience needs of her father dying  of a heart attack when he was 78 years old and her aunt recently dying of a stroke at 64 years old.   The patient is clearly having adjustment difficulties with residual effects of her cerebrovascular accident including significant anxiety and depression  which are exacerbating residual effects of her cerebrovascular accident that occurred in March of this year.  The patient has not been able to return to work.  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 Clinical Psychologist Neuropsychologist

## 2021-03-18 ENCOUNTER — Ambulatory Visit: Payer: 59 | Attending: Internal Medicine

## 2021-03-18 DIAGNOSIS — Z23 Encounter for immunization: Secondary | ICD-10-CM

## 2021-03-18 NOTE — Progress Notes (Signed)
   Covid-19 Vaccination Clinic  Name:  Kayci Belleville    MRN: [id number] DOB: 1965/05/30  03/18/2021  Ms. Azam was observed post Covid-19 immunization for 15 minutes without incident. She was provided with Vaccine Information Sheet and instruction to access the V-Safe system.   Ms. Karapetyan was instructed to call 911 with any severe reactions post vaccine: Difficulty breathing  Swelling of face and throat  A fast heartbeat  A bad rash all over body  Dizziness and weakness   Immunizations Administered     Name Date Dose VIS Date Route   Moderna Covid-19 vaccine Bivalent Booster 03/18/2021  1:10 PM 0.5 mL 01/08/2021 Intramuscular   Manufacturer: Moderna   Lot: [id number]   NDC: 60630-160-10

## 2021-03-29 NOTE — Telephone Encounter (Unsigned)
Called patient again. She states that she will be out of town 11/21 and requests to have appointments this week or in December. She understands that there is no availability this week. Scheduled her for echo and visit with Carlean Jews (she can only confirmg

## 2021-03-30 LAB — CUP PACEART REMOTE DEVICE CHECK
Date Time Interrogation Session: 20221101230308
Implantable Pulse Generator Implant Date: 20210303

## 2021-04-04 ENCOUNTER — Ambulatory Visit (INDEPENDENT_AMBULATORY_CARE_PROVIDER_SITE_OTHER): Payer: 59

## 2021-04-04 ENCOUNTER — Encounter: Payer: 59 | Attending: Psychology | Admitting: Psychology

## 2021-04-04 ENCOUNTER — Other Ambulatory Visit: Payer: Self-pay

## 2021-04-04 DIAGNOSIS — I63411 Cerebral infarction due to embolism of right middle cerebral artery: Secondary | ICD-10-CM

## 2021-04-04 DIAGNOSIS — R202 Paresthesia of skin: Secondary | ICD-10-CM | POA: Diagnosis present

## 2021-04-04 DIAGNOSIS — I69398 Other sequelae of cerebral infarction: Secondary | ICD-10-CM | POA: Diagnosis present

## 2021-04-04 DIAGNOSIS — I69919 Unspecified symptoms and signs involving cognitive functions following unspecified cerebrovascular disease: Secondary | ICD-10-CM | POA: Diagnosis present

## 2021-04-04 DIAGNOSIS — F0630 Mood disorder due to known physiological condition, unspecified: Secondary | ICD-10-CM | POA: Diagnosis present

## 2021-04-05 ENCOUNTER — Encounter: Payer: Self-pay | Admitting: Psychology

## 2021-04-05 NOTE — Progress Notes (Signed)
Neuropsychology Visit  Patient:  Tara [name]   DOB: [birth year]  MR Number: [id number]  Location: [facility] FOR PAIN AND [facility] MEDICINE [facility] PHYSICAL MEDICINE AND REHABILITATION [address] [place], [address] [phone] [facility]  [place] [address] Dept: [phone]  Date of Service: 04/04/2021  Start: 2 PM End: 3 PM Today's visit was an in person visit that was conducted in my outpatient clinic office.  The patient and myself were present for this visit.  Duration of Service: 1 Hour  Provider/Observer:     Hershal Coria PsyD  Chief Complaint:      Chief Complaint  Patient presents with   Cerebrovascular Accident   Anxiety   Depression    Reason For Service:      Tara [name] is a 55 year old female referred by Ihor Austin, NP with [facility] for neuropsychological consultation.  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  The patient had a cerebrovascular accident on 07/29/2019 and is continued to have difficulties with adjustment and residual cognitive deficits and changes, changes in mood and residual left-handed paresthesias.  She has significant fear about stroke and other vascular events with personal experience needs of her father dying of a heart attack when he was 55 years old and her aunt recently dying of a stroke at 32 years old.  Treatment Interventions:  Cognitive behavioral and other coping strategies around adapting to residual effects of her CVA.  Participation Level:   Active  Participation Quality:  Appropriate      Behavioral Observation:  Well Groomed, Alert, and Appropriate.   Current Psychosocial Factors: The patient reports that she is doing little bit better being around people and not worrying as much is she had been about what the thought of her or how they interpreted her changes since her stroke.  The patient reports that she  continues to have paresthesia and has intrusive thoughts about having another stroke/vascular event.  Content of Session:   Reviewed current symptoms and continue to work on therapeutic interventions around coping and adjustment to late effects of her CVA.  Effectiveness of Interventions: Patient was fully engaged in rapport has been easily to establish.  The patient actively participates in therapeutic interventions and working on coping resources.  Target Goals:   Improved coping and adjustment issues around residual effects of her CVA.  Goals Last Reviewed:   04/04/2022  Goals Addressed Today:    We continue to work on coping and adjustment issues particular around some of the foundational issues as well as also the way she interprets changes she is experienced since her stroke and ways to better manage the impact it is having on self-esteem as well as coping with the impact it is having on her husband and his response to her.  Impression/Diagnosis:   Tara [name] is a 55 year old female referred by Ihor Austin, NP with [facility] for neuropsychological consultation.  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  The patient had a cerebrovascular accident on 07/29/2019 and is continued to have difficulties with adjustment and residual cognitive deficits and changes, changes in mood and residual left-handed paresthesias.  She has significant fear about stroke and other vascular events with personal experience needs of her father dying of a heart attack when he was 20 years old and her aunt recently dying of a stroke at 82 years old.   The patient is  clearly having adjustment difficulties with residual effects of her cerebrovascular accident including significant anxiety and depression which are exacerbating residual effects of her cerebrovascular accident that occurred in March of this year.  The patient has not been  able to return to work.  The patient reports that she has had a lot of anxiety recently.  The patient reports that much of it is worry about her financial situation.  The patient has a long-term disability policy and they are having her apply for Social Security disability.  While the patient knows she is unable to go back to full-time gainful employment and has significant residual deficits from her stroke she worries what would happen with regard to the disability determination.  We worked on coping and adjustment issues particular around her anxiety.  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 Clinical Psychologist Neuropsychologist

## 2021-04-07 NOTE — Progress Notes (Signed)
[facility] Summary Report / Loop Recorder 

## 2021-04-15 ENCOUNTER — Other Ambulatory Visit (HOSPITAL_BASED_OUTPATIENT_CLINIC_OR_DEPARTMENT_OTHER): Payer: Self-pay

## 2021-04-15 MED ORDER — MODERNA COVID-19 BIVAL BOOSTER 50 MCG/0.5ML IM SUSP
INTRAMUSCULAR | 0 refills | Status: AC
  Filled 2021-04-15: qty 0.5, 1d supply, fill #0

## 2021-04-18 ENCOUNTER — Ambulatory Visit: Payer: 59 | Admitting: Cardiology

## 2021-04-18 ENCOUNTER — Other Ambulatory Visit (HOSPITAL_COMMUNITY): Payer: 59

## 2021-05-02 ENCOUNTER — Telehealth: Payer: Self-pay | Admitting: Adult Health

## 2021-05-02 NOTE — Telephone Encounter (Signed)
Last saw JM,NP

## 2021-05-02 NOTE — Telephone Encounter (Addendum)
Called pt and LVM for her to call back and schedule her Initial Cpap appt.

## 2021-05-04 ENCOUNTER — Other Ambulatory Visit: Payer: Self-pay

## 2021-05-04 ENCOUNTER — Ambulatory Visit (INDEPENDENT_AMBULATORY_CARE_PROVIDER_SITE_OTHER): Payer: 59 | Admitting: Physician Assistant

## 2021-05-04 ENCOUNTER — Encounter: Payer: Self-pay | Admitting: Physician Assistant

## 2021-05-04 ENCOUNTER — Ambulatory Visit (HOSPITAL_COMMUNITY): Payer: 59 | Attending: Cardiology

## 2021-05-04 VITALS — BP 112/78 | HR 68 | Ht 64.0 in | Wt 159.0 lb

## 2021-05-04 DIAGNOSIS — Z8669 Personal history of other diseases of the nervous system and sense organs: Secondary | ICD-10-CM

## 2021-05-04 DIAGNOSIS — Q2112 Patent foramen ovale: Secondary | ICD-10-CM | POA: Insufficient documentation

## 2021-05-04 DIAGNOSIS — I63411 Cerebral infarction due to embolism of right middle cerebral artery: Secondary | ICD-10-CM

## 2021-05-04 DIAGNOSIS — Z8774 Personal history of (corrected) congenital malformations of heart and circulatory system: Secondary | ICD-10-CM

## 2021-05-04 DIAGNOSIS — G5603 Carpal tunnel syndrome, bilateral upper limbs: Secondary | ICD-10-CM

## 2021-05-04 LAB — CUP PACEART REMOTE DEVICE CHECK
Date Time Interrogation Session: 20221204230643
Implantable Pulse Generator Implant Date: 20210303

## 2021-05-04 LAB — ECHOCARDIOGRAM LIMITED BUBBLE STUDY
Area-P 1/2: 3.53 cm2
S' Lateral: 3.2 cm

## 2021-05-04 NOTE — Progress Notes (Signed)
HEART AND VASCULAR CENTER   MULTIDISCIPLINARY HEART VALVE CLINIC                                     Cardiology Office Note:    Date:  05/04/2021   ID:  Tara [name], DOB March 06, 1966, MRN [id number]  PCP:  Tara Ladd, MD  [facility] Cardiologist:  Dr. Jamey [name] Electrophysiologist:  None   Referring MD: Tara Ladd, MD   1 year s/p PFO closure.   History of Present Illness:    Tara [name] is a 55 y.o. female with a hx of HLD, asthma, cryptogenic CVA, migraines and PFO s/p percutaneous PFO closure (04/19/20) who presents to clinic for follow up.   She had a cryptogenic stroke in March 2021 and referred to Dr. Excell [name] by Dr Tara [name] for consideration of PFO closure. She had a prior history of migraine headache, and presented with dysarthria, left arm numbness and weakness, and dysphagia. She was found to have 2 right parietal infarcts suggesting an embolic etiology. Hypercoagulable workup was negative and CTA studies showed no large vessel disease. The patient underwent loop recorder implantation and this has shown no evidence of atrial fibrillation to date. She underwent a transcranial doppler study that was positive for moderate to large right to left shunt. A TEE showed normal LV function, no significant valvular disease, and the presence of a PFO with strongly positive bubble study.   She underwent successful transcatheter PFO closure using an 18 mm PFO occluder device on 04/19/20. Post op limited echo showed normal EF and device placement with no atrial level shunting. She was discharged on aspirin and plavix x 6 months. At 1 month follow up the pt complained of pleuritic chest pain and repeat limited echo was stable.   Today she presents to clinic for follow up. Here with her husband. She has had a couple rare episodes of CP but no SOB. Occasionally gets right ankle edema. Xray completed by PCP which was normal. No orthopnea or PND. No dizziness or  syncope. No blood in stool or urine. No palpitations. Migraines have worsened a bit with menopause.   Past Medical History:  Diagnosis Date   Asthma    Hyperlipidemia    Migraine    Rotator cuff tear    Spondylolisthesis    L4-5    Past Surgical History:  Procedure Laterality Date   BUBBLE STUDY  07/30/2019   Procedure: BUBBLE STUDY;  Surgeon: Tara Ishikawa, MD;  Location: [facility] ENDOSCOPY;  Service: Cardiovascular;;   LOOP RECORDER INSERTION N/A 07/30/2019   Procedure: LOOP RECORDER INSERTION;  Surgeon: Tara Maw, MD;  Location: [facility] INVASIVE CV LAB;  Service: Cardiovascular;  Laterality: N/A;   No prior surgery     PATENT FORAMEN OVALE(PFO) CLOSURE N/A 04/19/2020   Procedure: PATENT FORAMEN OVALE (PFO) CLOSURE;  Surgeon: Tara Bollman, MD;  Location: [facility] INVASIVE CV LAB;  Service: Cardiovascular;  Laterality: N/A;   TEE WITHOUT CARDIOVERSION N/A 07/30/2019   Procedure: TRANSESOPHAGEAL ECHOCARDIOGRAM (TEE);  Surgeon: Tara Ishikawa, MD;  Location: [facility] ENDOSCOPY;  Service: Cardiovascular;  Laterality: N/A;    Current Medications: Current Meds  Medication Sig   aspirin EC 81 MG EC tablet Take 1 tablet (81 mg total) by mouth daily.   cholecalciferol (VITAMIN D) 1000 UNITS tablet Take 1,000 Units by mouth daily.   Coenzyme Q10 (COQ10 PO)  Take 1 tablet by mouth daily.    COVID-19 mRNA bivalent vaccine, Moderna, (MODERNA COVID-19 BIVAL BOOSTER) 50 MCG/0.5ML injection Inject into the muscle.   Omega-3 Fatty Acids (FISH OIL) 500 MG CAPS Natures made Fish oil pearls 500mg  BID   Probiotic Product (PROBIOTIC PO) Take 1 capsule by mouth daily.    rizatriptan (MAXALT) 10 MG tablet Take 10 mg by mouth as needed for migraine (headache).    rosuvastatin (CRESTOR) 20 MG tablet TAKE 1 TABLET BY MOUTH ONCE A DAY   scopolamine (TRANSDERM-SCOP) 1 MG/3DAYS Place 1 patch onto the skin daily as needed (Motion sickness).    [DISCONTINUED] amoxicillin (AMOXIL) 500 MG capsule TAKE 4 CAPSULES  BY MOUTH 60 MINUTES BEFORE ANY DENTAL PROCEDURE.     Allergies:   Crestor [rosuvastatin], Cyclobenzaprine, and Tape   Social History   Socioeconomic History   Marital status: Married    Spouse name: Not on file   Number of children: 2   Years of education: BSN   Highest education level: Not on file  Occupational History   Occupation: [employer]  Tobacco Use   Smoking status: Never   Smokeless tobacco: Never  Substance and Sexual Activity   Alcohol use: No    Alcohol/week: 0.0 standard drinks    Comment: Rare   Drug use: No   Sexual activity: Yes    Birth control/protection: None  Other Topics Concern   Not on file  Social History Narrative   Lives at home w/ her husband and son   Right-handed   Caffeine: seldom, soda on the weekend   Social Determinants of Health   Financial Resource Strain: Not on file  Food Insecurity: Not on file  Transportation Needs: Not on file  Physical Activity: Not on file  Stress: Not on file  Social Connections: Not on file     Family History: The patient's family history includes Heart disease (age of onset: 17) in her father; Hyperlipidemia in her brother; Hypertension in her maternal grandmother.  ROS:   Please see the history of present illness.    All other systems reviewed and are negative.  EKGs/Labs/Other Studies Reviewed:    The following studies were reviewed today:  PFO closure 04/19/20 PATENT FORAMEN OVALE (PFO) CLOSURE  Conclusion Successful transcatheter PFO closure using an 18 mm PFO occluder device with intracardiac echo and fluoroscopic guidance   ASA and clopidogrel x 6 months without interruption SBE prophylaxis when indicated x 6 months Limited echo in post-procedure area Same day discharge protocol if no early complications  Recommendations Antiplatelet/Anticoag Recommend uninterrupted dual antiplatelet therapy with Aspirin 81mg  daily and Clopidogrel 75mg  daily. 6 months, then long-term ASA alone    _________________  Limited echo 04/19/20 IMPRESSIONS  1. Limited study for PFO closure.   2. Left ventricular ejection fraction, by estimation, is 60 to 65%. The left ventricle has normal function.   3. Echogenic mass noted over the IAS, c/w prior device closure. There is no evidence for residual atrial level shunt.   ___________________  Limited echo 06/23/20 IMPRESSIONS   1. Left ventricular ejection fraction, by estimation, is 60 to 65%. The  left ventricle has normal function. The left ventricle has no regional  wall motion abnormalities. There is mild concentric left ventricular  hypertrophy. Left ventricular diastolic  parameters are consistent with Grade I diastolic dysfunction (impaired  relaxation).   2. Right ventricular systolic function is normal. The right ventricular  size is normal.   3. Interatrial echodensity consistent with device.  No evidence of PFO  device closure leak at a Nyquist limit of 54.9 cm/s. No evidence of  erosion.   4. The mitral valve is normal in structure. No evidence of mitral valve  regurgitation.   5. The aortic valve is normal in structure. Aortic valve regurgitation is  not visualized. No aortic stenosis is present.   Comparison(s): A prior study was performed on 04/24/2020. No significant  change from prior study. Prior images reviewed side by side.   _____________________  Limited echo 05/04/21 IMPRESSION   1. Patient is s/p PFO closure with an 4mm occluder device. There is no  residual shunt visualized by color doppler or agitated saline contrast  bubble study.   2. Left ventricular ejection fraction, by estimation, is 60 to 65%. The  left ventricle has normal function. The left ventricle has no regional  wall motion abnormalities. There is mild concentric left ventricular  hypertrophy. Left ventricular diastolic  parameters are consistent with Grade I diastolic dysfunction (impaired  relaxation).   3. Right ventricular  systolic function is normal. The right ventricular  size is normal.   4. The mitral valve is normal in structure. Trivial mitral valve  regurgitation.   Comparison(s): Compared to prior TTE in 05/2020, there is no significant  change.   EKG:  EKG is ordered today.  EKG shows sinus HR 68  Recent Labs: No results found for requested labs within last 8760 hours.  Recent Lipid Panel    Component Value Date/Time   CHOL 241 (H) 07/29/2019 1132   TRIG 193 (H) 07/29/2019 1132   HDL 51 07/29/2019 1132   CHOLHDL 4.7 07/29/2019 1132   VLDL 39 07/29/2019 1132   LDLCALC 151 (H) 07/29/2019 1132     Risk Assessment/Calculations:       Physical Exam:    VS:  BP 112/78   Pulse 68   Ht 5\' 4"  (1.626 m)   Wt 159 lb (72.1 kg)   LMP 11/24/2013   BMI 27.29 kg/m     Wt Readings from Last 3 Encounters:  05/04/21 159 lb (72.1 kg)  01/04/21 161 lb 9.6 oz (73.3 kg)  07/06/20 162 lb 9.6 oz (73.8 kg)     GEN: Well nourished, well developed in no acute distress HEENT: Normal NECK: No JVD; No carotid bruits LYMPHATICS: No lymphadenopathy CARDIAC: RRR, no murmurs, rubs, gallops RESPIRATORY:  Clear to auscultation without rales, wheezing or rhonchi  ABDOMEN: Soft, non-tender, non-distended MUSCULOSKELETAL:  No edema; No deformity  SKIN: Warm and dry NEUROLOGIC:  Alert and oriented x 3 PSYCHIATRIC:  Normal affect   ASSESSMENT:    1. S/P percutaneous patent foramen ovale closure   2. Cerebrovascular accident (CVA) due to embolism of right middle cerebral artery ([place])   3. History of migraine headaches   4. Bilateral carpal tunnel syndrome     PLAN:    In order of problems listed above:  PFO s/p PFO closure: doing well 1 year out from PFO closure. Echo with bubble shows EF 60%, normally seated device with negative bubble study. Continue lifelong aspirin. No longer needs SBE prophylaxis. PRN follow up  Hx of CVA: now s/p PFO closure. Continue on aspirin and statin  Migraines: these  have slightly improved since PFO closure.   Bilateral carpal tunnel: she has upcoming surgery. She is cleared from a cardiology perspective for this.   Medication Adjustments/Labs and Tests Ordered: Current medicines are reviewed at length with the patient today.  Concerns regarding medicines are outlined  above.  No orders of the defined types were placed in this encounter.  No orders of the defined types were placed in this encounter.   Patient Instructions  Medication Instructions:  1) Discontinue Amoxicillin   2) Start taking Aspirin every day   *If you need a refill on your cardiac medications before your next appointment, please call your pharmacy*   Lab Work: None ordered   If you have labs (blood work) drawn today and your tests are completely normal, you will receive your results only by: [place] (if you have MyChart) OR A paper copy in the mail If you have any lab test that is abnormal or we need to change your treatment, we will call you to review the results.   Testing/Procedures: None ordered    Follow-Up: Follow up as needed }    Other Instructions None     Signed, Angelena Form, PA-C  05/04/2021 4:44 PM    [facility]

## 2021-05-04 NOTE — Patient Instructions (Signed)
Medication Instructions:  1) Discontinue Amoxicillin   2) Start taking Aspirin every day   *If you need a refill on your cardiac medications before your next appointment, please call your pharmacy*   Lab Work: None ordered   If you have labs (blood work) drawn today and your tests are completely normal, you will receive your results only by: MyChart Message (if you have MyChart) OR A paper copy in the mail If you have any lab test that is abnormal or we need to change your treatment, we will call you to review the results.   Testing/Procedures: None ordered    Follow-Up: Follow up as needed }    Other Instructions None

## 2021-05-08 NOTE — Addendum Note (Signed)
Addended by: Vernard Gambles on: 05/08/2021 08:28 AM   Modules accepted: Orders

## 2021-05-09 ENCOUNTER — Ambulatory Visit (INDEPENDENT_AMBULATORY_CARE_PROVIDER_SITE_OTHER): Payer: 59

## 2021-05-09 DIAGNOSIS — I63411 Cerebral infarction due to embolism of right middle cerebral artery: Secondary | ICD-10-CM | POA: Diagnosis not present

## 2021-05-10 ENCOUNTER — Encounter: Payer: 59 | Attending: Psychology | Admitting: Psychology

## 2021-05-10 DIAGNOSIS — R202 Paresthesia of skin: Secondary | ICD-10-CM | POA: Insufficient documentation

## 2021-05-10 DIAGNOSIS — F0630 Mood disorder due to known physiological condition, unspecified: Secondary | ICD-10-CM | POA: Insufficient documentation

## 2021-05-10 DIAGNOSIS — I69919 Unspecified symptoms and signs involving cognitive functions following unspecified cerebrovascular disease: Secondary | ICD-10-CM | POA: Insufficient documentation

## 2021-05-10 DIAGNOSIS — I69398 Other sequelae of cerebral infarction: Secondary | ICD-10-CM | POA: Insufficient documentation

## 2021-05-16 NOTE — Progress Notes (Signed)
[facility] Summary Report / Loop Recorder 

## 2021-06-02 ENCOUNTER — Other Ambulatory Visit (HOSPITAL_COMMUNITY): Payer: 59

## 2021-06-02 ENCOUNTER — Ambulatory Visit: Payer: 59 | Admitting: Physician Assistant

## 2021-06-08 ENCOUNTER — Encounter: Payer: 59 | Attending: Psychology | Admitting: Psychology

## 2021-06-08 ENCOUNTER — Other Ambulatory Visit: Payer: Self-pay

## 2021-06-08 DIAGNOSIS — I69919 Unspecified symptoms and signs involving cognitive functions following unspecified cerebrovascular disease: Secondary | ICD-10-CM | POA: Insufficient documentation

## 2021-06-08 DIAGNOSIS — R202 Paresthesia of skin: Secondary | ICD-10-CM | POA: Diagnosis not present

## 2021-06-08 DIAGNOSIS — I63411 Cerebral infarction due to embolism of right middle cerebral artery: Secondary | ICD-10-CM

## 2021-06-08 DIAGNOSIS — F0630 Mood disorder due to known physiological condition, unspecified: Secondary | ICD-10-CM | POA: Insufficient documentation

## 2021-06-08 DIAGNOSIS — I69398 Other sequelae of cerebral infarction: Secondary | ICD-10-CM | POA: Insufficient documentation

## 2021-06-13 ENCOUNTER — Ambulatory Visit (INDEPENDENT_AMBULATORY_CARE_PROVIDER_SITE_OTHER): Payer: 59

## 2021-06-13 DIAGNOSIS — I63411 Cerebral infarction due to embolism of right middle cerebral artery: Secondary | ICD-10-CM | POA: Diagnosis not present

## 2021-06-13 LAB — CUP PACEART REMOTE DEVICE CHECK
Date Time Interrogation Session: 20230115230637
Implantable Pulse Generator Implant Date: 20210303

## 2021-06-22 NOTE — Progress Notes (Signed)
[facility] Summary Report / Loop Recorder 

## 2021-06-24 ENCOUNTER — Encounter: Payer: Self-pay | Admitting: Adult Health

## 2021-06-27 NOTE — Telephone Encounter (Signed)
Did have dizziness after stroke but did not mention this being continuous at recent visit - has she continued to have this since her stroke? does this recent event feel similar to what she was previously having? If symptoms have persisted or reoccurred, would highly encourage proceeding to the ED for emergent evaluation. If resolved and seems different from prior stroke symptoms, would recommend follow up with PCP to further evaluate as multiple things can cause these symptoms.

## 2021-07-06 ENCOUNTER — Ambulatory Visit: Payer: 59 | Admitting: Adult Health

## 2021-07-06 ENCOUNTER — Encounter: Payer: Self-pay | Admitting: Psychology

## 2021-07-06 NOTE — Progress Notes (Signed)
Neuropsychology Visit  Patient:  Tara [name]   DOB: [birth year]  MR Number: [id number]  Location: [facility] PH[facility] MEDICINE AND REHABILITATION [address] [place], [address] [id number] [facility] [place] [address] Dept: [phone]  Date of Service: 06/08/2021  Start: 4 PM End: 5 PM Today's visit was an in person visit that was conducted in my outpatient clinic office.  The patient and myself were present for this visit.  Duration of Service: 1 Hour  Provider/Observer:     Hershal Coria PsyD  Chief Complaint:      Chief Complaint  Patient presents with   Cerebrovascular Accident   Memory Loss   Anxiety   Depression    Reason For Service:      Tara [name] is a 56 year old female referred by Ihor Austin, NP with [facility] for neuropsychological consultation.  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  The patient had a cerebrovascular accident on 07/29/2019 and is continued to have difficulties with adjustment and residual cognitive deficits and changes, changes in mood and residual left-handed paresthesias.  She has significant fear about stroke and other vascular events with personal experience needs of her father dying of a heart attack when he was 93 years old and her aunt recently dying of a stroke at 28 years old.  Treatment Interventions:  Cognitive behavioral and other coping strategies around adapting to residual effects of her CVA.  Participation Level:   Active  Participation Quality:  Appropriate      Behavioral Observation:  Well Groomed, Alert, and Appropriate.   Current Psychosocial Factors: The patient reports that she is doing little bit better being around people and not worrying as much is she had been about what the thought of her or how they interpreted her changes since her stroke.  The patient  reports that she continues to have paresthesia and has intrusive thoughts about having another stroke/vascular event.  Content of Session:   Reviewed current symptoms and continue to work on therapeutic interventions around coping and adjustment to late effects of her CVA.  Effectiveness of Interventions: Patient was fully engaged in rapport has been easily to establish.  The patient actively participates in therapeutic interventions and working on coping resources.  Target Goals:   Improved coping and adjustment issues around residual effects of her CVA.  Goals Last Reviewed:   06/08/2021  Goals Addressed Today:    We continue to work on coping and adjustment issues particular around some of the foundational issues as well as also the way she interprets changes she is experienced since her stroke and ways to better manage the impact it is having on self-esteem as well as coping with the impact it is having on her husband and his response to her.  Impression/Diagnosis:   Tara [name] is a 56 year old female referred by Ihor Austin, NP with [facility] for neuropsychological consultation.  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  The patient had a cerebrovascular accident on 07/29/2019 and is continued to have difficulties with adjustment and residual cognitive deficits and changes, changes in mood and residual left-handed paresthesias.  She has significant fear about stroke and other vascular events with personal experience needs of her father dying of a heart attack when he was 38 years old and her aunt recently dying of a stroke at 58 years old.  The patient is clearly having adjustment difficulties with residual effects of her cerebrovascular accident including significant anxiety and depression which are exacerbating residual effects of her cerebrovascular accident that occurred in March of this year.  The  patient has not been able to return to work.  The patient reports that she has had a lot of anxiety recently.  The patient reports that much of it is worry about her financial situation.  The patient has a long-term disability policy and they are having her apply for Social Security disability.  While the patient knows she is unable to go back to full-time gainful employment and has significant residual deficits from her stroke she worries what would happen with regard to the disability determination.  We worked on coping and adjustment issues particular around her anxiety.  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 Clinical Psychologist Neuropsychologist

## 2021-07-11 ENCOUNTER — Telehealth: Payer: Self-pay | Admitting: Adult Health

## 2021-07-11 NOTE — Telephone Encounter (Signed)
Will you combine the appointments ? Please advise

## 2021-07-11 NOTE — Telephone Encounter (Signed)
Pt would like a call from the nurse to discuss if appts for Initial CPAP appt 07/12/21 and follow up appt 08/23/21 can or will be consolidated.

## 2021-07-11 NOTE — Telephone Encounter (Signed)
Contacted pt, LVM rq call back  

## 2021-07-11 NOTE — Telephone Encounter (Signed)
Contacted pt again, LVM rq call back. Will also send pt mychart message.

## 2021-07-11 NOTE — Telephone Encounter (Signed)
At 1:46 pt left a vm returning the call to [facility], [place].  Please call

## 2021-07-11 NOTE — Telephone Encounter (Signed)
If doing well on CPAP and from stroke standpoint, then yes they can be combined but if having issues or concerns with one or the other then there isn't enough time in 1 visit to discuss both (I am usually with her for at least 30 minutes if not longer for just stroke follow up).

## 2021-07-12 ENCOUNTER — Encounter: Payer: Self-pay | Admitting: Adult Health

## 2021-07-12 ENCOUNTER — Ambulatory Visit (INDEPENDENT_AMBULATORY_CARE_PROVIDER_SITE_OTHER): Payer: 59 | Admitting: Adult Health

## 2021-07-12 VITALS — BP 122/81 | HR 101 | Ht 64.0 in | Wt 162.0 lb

## 2021-07-12 DIAGNOSIS — G4733 Obstructive sleep apnea (adult) (pediatric): Secondary | ICD-10-CM | POA: Diagnosis not present

## 2021-07-12 DIAGNOSIS — Z789 Other specified health status: Secondary | ICD-10-CM | POA: Diagnosis not present

## 2021-07-12 DIAGNOSIS — Z9989 Dependence on other enabling machines and devices: Secondary | ICD-10-CM

## 2021-07-12 NOTE — Progress Notes (Signed)
[facility] [address] [place] [address] [phone]       OFFICE FOLLOW UP NOTE  Tara [name] Date of Birth:  06-Sep-1965 Medical Record Number:  [id number]   Reason for visit: Initial CPAP follow-up    CHIEF COMPLAINT:  Chief Complaint  Patient presents with   Obstructive Sleep Apnea    RM 3 Alone  Pt [name] well, [name] having trouble with adjusting to CPAP. States it seems to be too high in pressure and it will make noise and make her up through out the night.      HPI:   Update 07/12/2021 JM: Patient [name] for initial CPAP compliance visit.  CPAP initiated 04/29/2021.  Review of compliance report from 06/12/2021 -07/11/2021 shows 27 out of 30 usage days with 17 days greater than 4 hours for 57% compliance.  Average usage [name] 4 hours and 36 minutes.  Residual AHI 1.6 with setting pressure from 6-16cm H2O and EPR level 3.  Pressure in the 93rd percentile 12.2.  Leaks in the 95th percentile 11.6.   Having some difficulty tolerating mask. She will wake up in the middle the night with the mask leaking and high pressure causing intolerance. Also reports dry mouth.  Currently using full facemask.  Initially tried nasal mask but was switched to full face mask due to mouth opening.  Epworth Sleepiness Scale 11.  Fatigue severity scale 53.     History provided for reference purposes only Update 12/27/2020 JM: Tara [name] for 35-monthroutine follow-up previously seen on 07/06/2020.  Overall stable.  Reports residual cognitive and gait impairment.  She remains in the process of applying for Social Security disability and currently on long-term disability through her former employer managed by PCP.  She continues to follow with Dr. 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  Denies new stroke/TIA symptoms.  Compliant on aspirin and Crestor without associated side effects.  Blood  pressure today 114/74.  Loop recorder has not shown atrial fibrillation thus far.  She has not yet received her CPAP machine reports previously being contacted by DME in April and was told they were still on back order and would be called when available.  Continues to follow with orthopedics for bilateral carpal tunnel and currently getting a quote from office for price of procedure. No new concerns at this time.  Update 07/06/2020 JM: Tara [name] for 443-monthtroke follow-up.  Overall stable from stroke standpoint without new stroke/TIA symptoms and reports residual cognitive impairment and gait impairment. Use of cane at times - denies any recent falls.she also continues to experience anxiety and depression poststroke and has been followed by Dr. RoSima Matashich she believes has been beneficial.  She remains on long term disability and still in the process of applying for social security disability. Loop recorder has not shown atrial fibrillation thus far.    Underwent sleep study due to continued complaints of excessive daytime fatigue which was completed on 04/12/2020 which showed evidence of sleep apnea and recommend initiation of CPAP.   She [name] currently awaiting CPAP machine as this has been on back order.   She also underwent PFO closure successfully without complication on 1197/67/3419nd recommended aspirin and Plavix for 6-62-monthration. She has remained on aspirin and plavix but does report increased bruising therefore started to take aspirin 55m37mery other day and has continued on plavix daily - denies any concerning bleeding.  She has not further discuss this with cardiology.  Complains of left hand numbness with EMG/NCV completed 03/17/2020 to further evaluate for possible underlying carpal tunnel or post stroke deficit.  Study showed bilateral moderately to severe Tara [name]>R carpal tunnel syndrome therefore referral placed to Ortho for further treatment options.  They plan on proceeding with  release procedure but currently holding until she [name] able to come off Plavix (around 09/2020). She did receive cortisone injections with improvement of severe pain (not waking her up at night) but continues to feel numbness. She also uses braces at night time as instructed by ortho.   She also reports episode last night of waking up with substernal pressure.  This pain did not radiate and denies any shortness of breath, nausea/vomiting, diaphoresis or palpitations.  Symptoms improved after she propped 3 pillows behind her - reports lasting for approximately 3 hours.  She denies any reoccurrence or prior symptoms.  Update 03/01/2020 JM: Tara [name] for 36-monthstroke follow-up unaccompanied.  Residual deficits of cognitive impairment, dysarthria, imbalance and left hand weakness and numbness as well as continued excessive fatigue.  She recently completed speech therapy as she met stated rehab goals but due to continued cognitive impairment and poststroke anxiety/depression, she has initial evaluation with Dr. RSima Matas10/13.  Cognition impairment with visual difficulty with high-level functioning, short-term recall, attention, processing and visual-spatial skills.  She also feels overwhelmed in crowded areas such as grocery store or needing to complete a more complex task.  She does report improvement of dizziness/imbalance with less frequency as before - will continue to feel with quick head movements and bending over.  Left hand paresthesias intermittent typically occurring with increased use of left hand or the middle of the night with burning sensation, numbness/tingling and "achy" feeling.  Symptoms present 3rd and 4th digit of left hand radiating in the middle of her wrist and stopping 1-2" below elbow.  Excessive daytime fatigue persists despite sleeping well through the night.  Does report occasional snoring.  She remains on long term disability and in the process of completing social security  disability.  Remains on aspirin and Crestor for secondary stroke prevention without side effects.  Blood pressure today 119/84.  Loop recorder has not shown atrial fibrillation thus far.  No further concerns at this time.  Update 11/18/2019 JM: Tara [name] for follow-up regarding cryptogenic right parietal stroke in 07/2019.  Residual deficits of mild cognitive impairment, left hand weakness, fatigue with decreased activity tolerance and dizziness/imbalance.  Continues to participate in speech therapy for impairments of attention, processing and visual-spatial skills as well as dysarthria with reported ongoing improvement.  Continues to work with PT for gait training, environmental scanning, and balance and dizziness progressing towards goals.  Continues to work with OT for improvement of LUE fine motor control and coordination and environmental standing/dynamic balance.  She does report ongoing difficulty concentrating especially when having multiple tasks to complete.  Also reports difficulty going into crowded places such as stores as she feels visually overstimulated and starts to feel anxious.  She denies anxiety or depression but based on PHQ-9 of 16 likely underlying depression.  Becomes tearful during visit as she [name] frustrated with ongoing deficits and feels as though she should have recovered more than what she has.  Reports left hand numbness/tingling located left middle and ring finger with radiation to elbow can wake her up in the middle of the night or become present with increased hand use. 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  Remains on aspirin and Crestor for secondary stroke prevention. Recent lipid panel by PCP showed LDL 53.  Blood pressure today 113/78.  Loop recorder [name] not shown atrial fibrillation thus far.  No further concerns at this time.  Update 09/29/2019 Dr. Leonie Man ; patient [name] seen upon request that she  call the office requesting medical leave be prolonged as she [name] having cognitive difficulties and fatigue and dizziness.  She states that she has been getting outpatient physical occupational therapy and but complains of a transient positional dizziness when she looks down or turns her neck quickly.  She [name] been getting some vestibular therapy which has been helping.  She was recently referred to speech therapy for mental status slowness and progressing with appointment has not happened yet.  She feels overall there [name] slow and steady progress but she [name] not ready to return to work yet.  She wants to be out of work till her next assessment with her primary physician Dr. Jacelyn Grip which [name] scheduled for June 6.  She has not had any recurrent stroke or TIA symptoms.  She does complain of some intermittent numbness and tightness in her middle 2 fingers in the left hand particularly when she [name] tired.  She [name] tolerating aspirin well without bruising or bleeding.  Blood pressures well controlled today it [name] 116/77.  She had to reduce the dose of Crestor to 20 mg that she had some muscle aches and pains which appear not to have improved.  She does take coenzyme Q 10 as well.  She denies any headache, slurred speech, increasing gait or balance problems.  Initial visit 08/13/2019 JM: Tara [name] a 56 year old female who [name] being seen today for hospital follow-up.  Residual stroke deficits of mild left-sided weakness, dizziness with quick head movements and unsteadiness on feet. She does endorse some improvement but recently just started therapies. She does endorse increased fatigue since discharge and feels like her mind [name] slower/delayed.  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  She initially had FMLA which expired on 3/15 and she [name] requesting for extension.  She [name] eager to continue with therapies for ongoing improvement.  She continues on DAPT but will be completing 3-week Plavix  course in the near future.  Denies bleeding or bruising.  She continues on Crestor 40 mg daily but does endorse myalgias bilateral upper extremities and torso.  Blood pressure today 118/88.  Loop recorder has not shown atrial fibrillation thus far.  No further concerns at this time.  Stroke admission 07/29/2019: Tara [name] a 56 y.o. female with history of HLD, migraine and asthma who presented with HA since 2/28 who woke w/ LUE numbness and weakness, severe dysarthria and dysphagia on 07/29/2019.  Evaluated by stroke team and Dr. Leonie Man with stroke work-up revealing 2 tiny right parietal infarcts embolic secondary to unknown source.  In addition to acute infarcts, MRI also showed several small chronic infarcts in the cerebellum but per review, Dr Leonie Man these were likely flow-voids and not old strokes.  TEE did not show evidence of PFO.  Recommended further evaluation with TCD which was suggestive of medium to large PFO.  Recommended further discussion outpatient regarding possible PFO closure.  Loop recorder placed to rule out atrial fibrillation as possible cause of stroke.  Hypercoagulable work-up negative.  Recommended DAPT  for 3 weeks and aspirin alone.  LDL 151 and recommended increasing Crestor frequency to 40 mg daily.  No history of HTN or DM.  Other stroke risk factors include migraines but no prior history of stroke.  Discharged home in stable condition without therapy needs.  Stroke:   2 tiny R parietal  infarcts embolic secondary to unknown source.Marland Kitchen PFO unclear if related to her stroke ROPE score 6 ) 62% chance pfo [name] related to stroke ) Code Stroke CT head hyperdense R MCA bifurcation. No acute abnormality. R maxillary sinus dz. ASPECTS 10.    CTA head & neck no LVO. Mild distal SMALL VESSEL DISEASE anterior and posterior circulation. Neck ok  CT perfusion Unremarkable  MRI  2 tiny acute cortical R brain (posterior middle frontal gyrus and posterior R parietal lobe). Read as old  cerebellar infarcts but  Dr. Leonie Man feels they are flow voids and not old stroke. Mild paranasal sinus dz.  2D Echo normal  TEE  PFO present EEG normal  LE dopplers no DVT  LDL 151 mg% HgbA1c 5.3 UDS not done  TSH normal    ROS:   14 system review of systems performed and negative with exception of those listed in HPI      PMH:  Past Medical History:  Diagnosis Date   Asthma    Hyperlipidemia    Migraine    Rotator cuff tear    Spondylolisthesis    L4-5    PSH:  Past Surgical History:  Procedure Laterality Date   BUBBLE STUDY  07/30/2019   Procedure: BUBBLE STUDY;  Surgeon: Donato Heinz, MD;  Location: [facility] ENDOSCOPY;  Service: Cardiovascular;;   LOOP RECORDER INSERTION N/A 07/30/2019   Procedure: LOOP RECORDER INSERTION;  Surgeon: Evans Lance, MD;  Location: [facility] LAB;  Service: Cardiovascular;  Laterality: N/A;   No prior surgery     PATENT FORAMEN OVALE(PFO) CLOSURE N/A 04/19/2020   Procedure: PATENT FORAMEN OVALE (PFO) CLOSURE;  Surgeon: Sherren Mocha, MD;  Location: [facility] [facility] LAB;  Service: Cardiovascular;  Laterality: N/A;   TEE WITHOUT CARDIOVERSION N/A 07/30/2019   Procedure: TRANSESOPHAGEAL ECHOCARDIOGRAM (TEE);  Surgeon: Donato Heinz, MD;  Location: [facility] ENDOSCOPY;  Service: Cardiovascular;  Laterality: N/A;    Social History:  Social History   Socioeconomic History   Marital status: Married    Spouse name: Not on file   Number of children: 2   Years of education: BSN   Highest education level: Not on file  Occupational History   Occupation: [employer]  Tobacco Use   Smoking status: Never   Smokeless tobacco: Never  Substance and Sexual Activity   Alcohol use: No    Alcohol/week: 0.0 standard drinks    Comment: Rare   Drug use: No   Sexual activity: Yes    Birth control/protection: None  Other Topics Concern   Not on file  Social History Narrative   Lives at home w/ her husband and son   Right-handed    Caffeine: seldom, soda on the weekend   Social Determinants of Health   Financial Resource Strain: Not on file  Food Insecurity: Not on file  Transportation Needs: Not on file  Physical Activity: Not on file  Stress: Not on file  Social Connections: Not on file  Intimate Partner Violence: Not on file    Family History:  Family History  Problem Relation Age of Onset   Heart disease Father 21  MI   Hypertension Maternal Grandmother    Hyperlipidemia Brother     Medications:   Current Outpatient Medications on File Prior to Visit  Medication Sig Dispense Refill   aspirin EC 81 MG EC tablet Take 1 tablet (81 mg total) by mouth daily.     cholecalciferol (VITAMIN D) 1000 UNITS tablet Take 1,000 Units by mouth daily.     Coenzyme Q10 (COQ10 PO) Take 1 tablet by mouth daily.      COVID-19 mRNA bivalent vaccine, Moderna, (MODERNA COVID-19 BIVAL BOOSTER) 50 MCG/0.5ML injection Inject into the muscle. 0.5 mL 0   Omega-3 Fatty Acids (FISH OIL) 500 MG CAPS Natures made Fish oil pearls 517m BID 180 capsule 1   Probiotic Product (PROBIOTIC PO) Take 1 capsule by mouth daily.      rizatriptan (MAXALT) 10 MG tablet Take 10 mg by mouth as needed for migraine (headache).      rosuvastatin (CRESTOR) 20 MG tablet TAKE 1 TABLET BY MOUTH ONCE A DAY 90 tablet 1   scopolamine (TRANSDERM-SCOP) 1 MG/3DAYS Place 1 patch onto the skin daily as needed (Motion sickness).      No current facility-administered medications on file prior to visit.    Allergies:   Allergies  Allergen Reactions   Crestor [Rosuvastatin] Other (See Comments)    Muscle pain with 40 mg   Cyclobenzaprine Other (See Comments)    "loopy"/ Can take 0.5 dose   Tape Rash    adhesive     Physical Exam  Vitals:   07/12/21 1316  BP: 122/81  Pulse: (!) 101  Weight: 162 lb (73.5 kg)  Height: 5' 4" (1.626 m)     Body mass index [name] 27.81 kg/m. No results found.  General: well developed, well nourished, pleasant  middle-age female, seated, in no evident distress Head: head normocephalic and atraumatic.   Neck: supple with no carotid or supraclavicular bruits Cardiovascular: regular rate and rhythm, no murmurs Musculoskeletal: no deformity Skin:  no rash/petichiae Vascular:  Normal pulses all extremities   Neurologic Exam Mental Status: Awake and fully alert.   Occasional decreased volume with occasional speech hesitancy.  Oriented to place and time. Recent memory impaired and remote memory intact. Attention span, concentration and fund of knowledge appropriate during visit. Mood and affect appropriate.   Cranial Nerves:  Pupils equal, briskly reactive to light. Extraocular movements full without nystagmus. Visual fields full to confrontation. Hearing intact. Decreased sensory left lower face. Left nasolabial fold flatting. tongue, palate moves normally and symmetrically.  Motor: Normal bulk and tone. Normal strength in all tested extremity muscles except mild diminished fine finger movements on the left  Sensory.: slight decreased light touch, pinprick and vibratory sensation LUE and LLE compared to right side Coordination: Rapid alternating movements normal in all extremities except decreased left hand. Finger-to-nose and heel-to-shin performed accurately bilaterally. Gait and Station: Arises from chair without difficulty. Stance [name] normal. Gait demonstrates normal stride length with mild imbalance and unsteadiness and use of cane Reflexes: 1+ and symmetric. Toes downgoing.        ASSESSMENT/PLAN: LAdalynn Corneis a 56y.o. year old female with right parietal infarcts x2 on 07/29/2019 secondary to unknown source s/p loop recorder as well as evidence of PFO s/p closure 03/2020.  Vascular risk factors include HLD, migraines and OSA on CPAP.  Being seen today, 07/12/2021, for initial CPAP compliance visit.    OSA on CPAP -Suboptimal compliance although optimal residual AHI at 1.6. -adjust  pressure setting from  6-16 to 6-14 to improve tolerance -discussed options to help with dry mouth -Discussed importance of ensuring adequate compliance -will plan on repeat download next month to ensure treatment remains adequate    R parietal strokes, cryptogenic -was not discussed at today's visit due to time constraints -has f/u visit scheduled on 3/28 for stroke follow-up   Follow-up as scheduled next month    CC:  Vernie Shanks, MD    I spent 34 minutes of face-to-face and non-face-to-face time with patient.  This included previsit chart review, lab review, study review, order entry, electronic health record documentation, patient education regarding dx of sleep apnea and initiation of CPAP, review and discussion of compliance report, discussion regarding intolerance and further options and answered all other questions to patient's satisfaction  Frann Rider, AGNP-BC  [facility] [name] [place], [place] [address]  Phone [phone] Fax [phone] Note: This document was prepared with digital dictation and possible smart phrase technology. Any transcriptional errors that result from this process are unintentional.

## 2021-07-12 NOTE — Patient Instructions (Addendum)
We will plan to decrease pressure settings to see if this helps with the pressure concern  If still difficulty tolerating, please let me know - can bring machine in at next visit to pull download again just to make sure apnea is being well controlled on new pressure setting   Plan to follow up next month as scheduled

## 2021-07-18 ENCOUNTER — Ambulatory Visit (INDEPENDENT_AMBULATORY_CARE_PROVIDER_SITE_OTHER): Payer: 59

## 2021-07-18 DIAGNOSIS — I63411 Cerebral infarction due to embolism of right middle cerebral artery: Secondary | ICD-10-CM | POA: Diagnosis not present

## 2021-07-18 LAB — CUP PACEART REMOTE DEVICE CHECK
Date Time Interrogation Session: 20230219230759
Implantable Pulse Generator Implant Date: 20210303

## 2021-07-21 NOTE — Progress Notes (Signed)
[facility] Summary Report / Loop Recorder 

## 2021-08-22 ENCOUNTER — Encounter: Payer: Self-pay | Admitting: Adult Health

## 2021-08-22 ENCOUNTER — Ambulatory Visit (INDEPENDENT_AMBULATORY_CARE_PROVIDER_SITE_OTHER): Payer: 59

## 2021-08-22 DIAGNOSIS — I63411 Cerebral infarction due to embolism of right middle cerebral artery: Secondary | ICD-10-CM

## 2021-08-23 ENCOUNTER — Encounter: Payer: Self-pay | Admitting: Adult Health

## 2021-08-23 ENCOUNTER — Ambulatory Visit (INDEPENDENT_AMBULATORY_CARE_PROVIDER_SITE_OTHER): Payer: 59 | Admitting: Adult Health

## 2021-08-23 VITALS — BP 111/76 | HR 91 | Ht 64.0 in | Wt 162.0 lb

## 2021-08-23 DIAGNOSIS — I639 Cerebral infarction, unspecified: Secondary | ICD-10-CM

## 2021-08-23 DIAGNOSIS — G4733 Obstructive sleep apnea (adult) (pediatric): Secondary | ICD-10-CM | POA: Diagnosis not present

## 2021-08-23 DIAGNOSIS — Z9989 Dependence on other enabling machines and devices: Secondary | ICD-10-CM

## 2021-08-23 LAB — CUP PACEART REMOTE DEVICE CHECK
Date Time Interrogation Session: 20230326230509
Implantable Pulse Generator Implant Date: 20210303

## 2021-08-23 NOTE — Patient Instructions (Signed)
Continue aspirin 81 mg daily  and Crestor  for secondary stroke prevention ? ?Continue to follow up with PCP regarding cholesterol and blood pressure management  ?Maintain strict control of hypertension with blood pressure goal below 130/90 and cholesterol with LDL cholesterol (bad cholesterol) goal below 70 mg/dL.  ? ?Signs of a Stroke? Follow the BEFAST method:  ?Balance Watch for a sudden loss of balance, trouble with coordination or vertigo ?Eyes Is there a sudden loss of vision in one or both eyes? Or double vision?  ?Face: Ask the person to smile. Does one side of the face droop or is it numb?  ?Arms: Ask the person to raise both arms. Does one arm drift downward? Is there weakness or numbness of a leg? ?Speech: Ask the person to repeat a simple phrase. Does the speech sound slurred/strange? Is the person confused ? ?Time: If you observe any of these signs, call 911. ? ? ? ? ?Followup in the future with me in 1 year or call earlier if needed ? ? ? ? ? ? ?Thank you for coming to see [place] at [facility]. I hope we have been able to provide you high quality care today. ? ?You may receive a patient satisfaction survey over the next few weeks. We would appreciate your feedback and comments so that we may continue to improve ourselves and the health of our patients. ? ?

## 2021-08-23 NOTE — Progress Notes (Signed)
?[facility] ?[address] ?[place]. [place] [address] ?[phone] ? ?     OFFICE FOLLOW UP NOTE ? ?[name] ?Date of Birth:  04-26-1966 ?Medical Record Number:  [id number]  ? ?Reason for visit: Stroke follow-up ? ? ? ?CHIEF COMPLAINT:  ?Chief Complaint  ?Patient presents with  ? Follow-up  ?  Rm 3 alone ?Pt is well, mention she still has slow process of thinking, some trouble with focusing and occasional dizziness   ? ? ? ?HPI:  ? ? ?Update 08/23/2021 JM: Patient returns for 32-monthstroke follow-up.  Overall stable from stroke standpoint.  Reports residual occasional dizziness, cognitive impairment and anxiety - denies any worsening. Continues to ambulate with a walking stick, no recent falls.  She has finally been approved for social security disability which is a large stress reliever for her!!  She continues to follow with Dr. 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  Denies new stroke/TIA symptoms.  Compliant on aspirin and Crestor, denies side effects.  Blood pressure today 111/76. Loop recorder has not shown atrial fibrillation thus far.  Reports nightly use of CPAP. Reports improved tolerance since pressure setting adjustments at prior visit. Repeat download report showed improved usage with 27/30 usage days and 21 days greater than 4 hours for 70% compliance with residual AHI 1.0. No further concerns at this time. ? ? ? ? ? ?History provided for reference purposes only ?Update 07/12/2021 JM: Patient returns for initial CPAP compliance visit.  CPAP initiated 04/29/2021.  Review of compliance report from 06/12/2021 -07/11/2021 shows 27 out of 30 usage days with 17 days greater than 4 hours for 57% compliance.  Average usage is 4 hours and 36 minutes.  Residual AHI 1.6 with setting pressure from 6-16cm H2O and EPR level 3.  Pressure in the 93rd percentile 12.2.  Leaks in the 95th percentile 11.6.   ? ?Having some difficulty tolerating mask. She will wake up in the middle the night with the mask leaking and high pressure causing intolerance. Also reports dry mouth.  Currently using full facemask.  Initially tried nasal mask but was switched to full face mask due to mouth opening.  Epworth Sleepiness Scale 11.  Fatigue severity scale 53. ? ? ?Update 12/27/2020 JM: Ms. AYoungersreturns for 628-monthoutine follow-up previously seen on 07/06/2020.  Overall stable.  Reports residual cognitive and gait impairment.  She remains in the process of applying for Social Security disability and currently on long-term disability through her former employer managed by PCP.  She continues to follow with Dr. 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  Denies new stroke/TIA symptoms.  Compliant on aspirin and Crestor without associated side effects.  Blood pressure today 114/74.  Loop recorder has not shown atrial fibrillation thus far.  She has not yet received her CPAP machine reports previously being contacted by DME in April and was told they were still on back order and would be called when available.  Continues to follow with orthopedics for bilateral carpal tunnel and currently getting a quote from office for price of procedure. No new concerns at this time. ? ?Update 07/06/2020 JM: Ms. AjNanningaeturns for 4-30-monthroke follow-up.  Overall stable from stroke standpoint without new stroke/TIA symptoms and reports residual cognitive impairment and gait impairment. Use of cane at times - denies any recent falls.she also continues to experience anxiety and depression  poststroke and has been followed by Dr. Sima Matas which she believes has been beneficial.  She remains on long term disability and still in the process of applying for social security disability. Loop recorder has not shown atrial fibrillation thus far.   ? ?Underwent sleep study due to  continued complaints of excessive daytime fatigue which was completed on 04/12/2020 which showed evidence of sleep apnea and recommend initiation of CPAP.   She is currently awaiting CPAP machine as this has been on back order.  ? ?She also underwent PFO closure successfully without complication on 99/24/2683 and recommended aspirin and Plavix for 10-monthduration. She has remained on aspirin and plavix but does report increased bruising therefore started to take aspirin 825mevery other day and has continued on plavix daily - denies any concerning bleeding.  She has not further discuss this with cardiology. ? ?Complains of left hand numbness with EMG/NCV completed 03/17/2020 to further evaluate for possible underlying carpal tunnel or post stroke deficit.  Study showed bilateral moderately to severe L>R carpal tunnel syndrome therefore referral placed to Ortho for further treatment options.  They plan on proceeding with release procedure but currently holding until she is able to come off Plavix (around 09/2020). She did receive cortisone injections with improvement of severe pain (not waking her up at night) but continues to feel numbness. She also uses braces at night time as instructed by ortho.  ? ?She also reports episode last night of waking up with substernal pressure.  This pain did not radiate and denies any shortness of breath, nausea/vomiting, diaphoresis or palpitations.  Symptoms improved after she propped 3 pillows behind her - reports lasting for approximately 3 hours.  She denies any reoccurrence or prior symptoms. ? ?Update 03/01/2020 JM: Ms. AjJuneaueturns for 3-42-monthroke follow-up unaccompanied.  Residual deficits of cognitive impairment, dysarthria, imbalance and left hand weakness and numbness as well as continued excessive fatigue.  She recently completed speech therapy as she met stated rehab goals but due to continued cognitive impairment and poststroke anxiety/depression, she has initial  evaluation with Dr. RodSima Matas/13.  Cognition impairment with visual difficulty with high-level functioning, short-term recall, attention, processing and visual-spatial skills.  She also feels overwhelmed in crowded areas such as grocery store or needing to complete a more complex task.  She does report improvement of dizziness/imbalance with less frequency as before - will continue to feel with quick head movements and bending over.  Left hand paresthesias intermittent typically occurring with increased use of left hand or the middle of the night with burning sensation, numbness/tingling and "achy" feeling.  Symptoms present 3rd and 4th digit of left hand radiating in the middle of her wrist and stopping 1-2" below elbow.  Excessive daytime fatigue persists despite sleeping well through the night.  Does report occasional snoring.  She remains on long term disability and in the process of completing social security disability.  Remains on aspirin and Crestor for secondary stroke prevention without side effects.  Blood pressure today 119/84.  Loop recorder has not shown atrial fibrillation thus far.  No further concerns at this time. ? ?Update 11/18/2019 JM: Ms. AjePinderturns for follow-up regarding cryptogenic right parietal stroke in 07/2019.  Residual deficits of mild cognitive impairment, left hand weakness, fatigue with decreased activity tolerance and dizziness/imbalance.  Continues to participate in speech therapy for impairments of attention, processing and visual-spatial skills as well as dysarthria with reported ongoing improvement.  Continues to work with PT for gait training,  environmental scanning, and balance and dizziness progressing towards goals.  Continues to work with OT for improvement of LUE fine motor control and coordination and environmental standing/dynamic balance.  She does report ongoing difficulty concentrating especially when having multiple tasks to complete.  Also reports difficulty  going into crowded places such as stores as she feels visually overstimulated and starts to feel anxious.  She denies anxiety or depression but based on PHQ-9 of 16 likely underlying depression.  Becomes tearful duri

## 2021-08-31 NOTE — Progress Notes (Signed)
[facility] Summary Report / Loop Recorder 

## 2021-09-07 ENCOUNTER — Telehealth: Payer: Self-pay

## 2021-09-07 NOTE — Telephone Encounter (Signed)
We received [name] paperwork for this patient, when calling to get the form fee, she advised that her PCP usually fills out this paperwork for her. She called the [name] and they stated that we did not need to fill out the forms, we could just send her office visit notes to them. I let her know I was not sure if we were able to do that based off of the documentation we received. Can you please give Tara [name] a call back to discuss this? ? ?She stated the records from 01/04/2021 through todays date would need to be faxed to the [name] @ [phone] Attn: [name] and include the claim #[id number] ?

## 2021-09-19 ENCOUNTER — Encounter: Payer: Self-pay | Admitting: Adult Health

## 2021-09-20 ENCOUNTER — Telehealth: Payer: Self-pay | Admitting: *Deleted

## 2021-09-20 NOTE — Telephone Encounter (Signed)
Please call patient to advise due to urgency  ? ?Typically, symptoms in only one eye is not stroke related but more likely an opthalmologic issue. I would recommend Tara [name] try to get in to see an ophthalmologist as soon as possible for further evaluation. If symptoms worsen or experience complete vision loss in your left eye or changes in right eye vision, I would highly encourage her to proceed to ED immediately for more emergent evaluation.  ? ?Thank you.  ?

## 2021-09-20 NOTE — Telephone Encounter (Signed)
Received my chart from patient re: vision changes. I called her and LVM advising her of NP's message in it's entirety.  I also sent it to her in my chart message: ? ?Please call patient to advise due to urgency  ?  ?Typically, symptoms in only one eye is not stroke related but more likely an opthalmologic issue. I would recommend she try to get in to see an ophthalmologist as soon as possible for further evaluation. If symptoms worsen or experience complete vision loss in your left eye or changes in right eye vision, I would highly encourage her to proceed to ED immediately for more emergent evaluation.  ?

## 2021-09-24 LAB — CUP PACEART REMOTE DEVICE CHECK
Date Time Interrogation Session: 20230428230134
Implantable Pulse Generator Implant Date: 20210303

## 2021-09-26 ENCOUNTER — Ambulatory Visit (INDEPENDENT_AMBULATORY_CARE_PROVIDER_SITE_OTHER): Payer: 59

## 2021-09-26 DIAGNOSIS — I63411 Cerebral infarction due to embolism of right middle cerebral artery: Secondary | ICD-10-CM

## 2021-09-29 ENCOUNTER — Encounter: Payer: 59 | Attending: Psychology | Admitting: Psychology

## 2021-09-29 DIAGNOSIS — I69919 Unspecified symptoms and signs involving cognitive functions following unspecified cerebrovascular disease: Secondary | ICD-10-CM | POA: Insufficient documentation

## 2021-09-29 DIAGNOSIS — F0630 Mood disorder due to known physiological condition, unspecified: Secondary | ICD-10-CM | POA: Insufficient documentation

## 2021-09-29 DIAGNOSIS — I69398 Other sequelae of cerebral infarction: Secondary | ICD-10-CM | POA: Diagnosis present

## 2021-09-29 DIAGNOSIS — R202 Paresthesia of skin: Secondary | ICD-10-CM | POA: Diagnosis present

## 2021-09-29 DIAGNOSIS — I63411 Cerebral infarction due to embolism of right middle cerebral artery: Secondary | ICD-10-CM | POA: Diagnosis present

## 2021-10-10 NOTE — Progress Notes (Signed)
[facility] Summary Report / Loop Recorder 

## 2021-10-18 ENCOUNTER — Encounter: Payer: Self-pay | Admitting: Psychology

## 2021-10-18 NOTE — Progress Notes (Signed)
Neuropsychology Visit  Patient:  Tara [name]   DOB: Mar 12, 1966  MR Number: [id number]  Location: [facility] FOR PAIN AND [facility] MEDICINE [facility] PHYSICAL MEDICINE AND REHABILITATION [address] [employer], [address] [id number] [facility] [place] [address] Dept: [phone]  Date of Service: 09/29/2021  Start: 2 PM End: 3 PM Today's visit was an in person visit that was conducted in my outpatient clinic office.  The patient and myself were present for this visit.  Duration of Service: 1 Hour  Provider/Observer:     Hershal Coria PsyD  Chief Complaint:      Chief Complaint  Patient presents with   Cerebrovascular Accident   Memory Loss   Anxiety   Depression    Reason For Service:      Tara [name] is a 56 year old female referred by Ihor Austin, NP with [facility] for neuropsychological consultation.  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  The patient had a cerebrovascular accident on 07/29/2019 and is continued to have difficulties with adjustment and residual cognitive deficits and changes, changes in mood and residual left-handed paresthesias.  She has significant fear about stroke and other vascular events with personal experience needs of her father dying of a heart attack when he was 80 years old and her aunt recently dying of a stroke at 79 years old.  Treatment Interventions:  Cognitive behavioral and other coping strategies around adapting to residual effects of her CVA.  Participation Level:   Active  Participation Quality:  Appropriate      Behavioral Observation:  Well Groomed, Alert, and Appropriate.   Current Psychosocial Factors: The patient reports that there have been a lot of improvement around her family doing a better job coping and she has utilized some of the strategies we addressed previously of how to talk to her husband about his style  of interacting with her and making her feel pressured for time etc.  As she talked to him about how it actually slows her down to be pushed to move faster he adjusted his style and she reports that that has been very helpful for her.  Content of Session:   Reviewed current symptoms and continue to work on therapeutic interventions around coping and adjustment to late effects of her CVA.  Effectiveness of Interventions: Patient was fully engaged in rapport has been easily to establish.  The patient actively participates in therapeutic interventions and working on coping resources.  Target Goals:   Improved coping and adjustment issues around residual effects of her CVA.  Goals Last Reviewed:   09/29/2021  Goals Addressed Today:    We continue to work on coping and adjustment issues particular around some of the foundational issues as well as also the way she interprets changes she is experienced since her stroke and ways to better manage the impact it is having on self-esteem as well as coping with the impact it is having on her husband and his response to her.  Impression/Diagnosis:   Tara [name] is a 56 year old female referred by Ihor Austin, NP with [facility] for neuropsychological consultation.  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  The patient had a cerebrovascular accident on 07/29/2019 and is continued to have difficulties with adjustment and residual cognitive deficits and changes, changes in mood and residual left-handed paresthesias.  She has significant fear about stroke and other vascular events with  personal experience needs of her father dying of a heart attack when he was 44 years old and her aunt recently dying of a stroke at 99 years old.   The patient is clearly having adjustment difficulties with residual effects of her cerebrovascular accident including significant anxiety and depression  which are exacerbating residual effects of her cerebrovascular accident that occurred in March of this year.  The patient has not been able to return to work.  The patient reports that her anxiety has improved considerably and some of the psychosocial variables and socioeconomic issues have improved more recently.  She is also having better interactions and more effective interactions with her husband.  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 Clinical Psychologist Neuropsychologist

## 2021-10-30 LAB — CUP PACEART REMOTE DEVICE CHECK
Date Time Interrogation Session: 20230531230712
Implantable Pulse Generator Implant Date: 20210303

## 2021-10-31 ENCOUNTER — Ambulatory Visit (INDEPENDENT_AMBULATORY_CARE_PROVIDER_SITE_OTHER): Payer: 59

## 2021-10-31 DIAGNOSIS — I63411 Cerebral infarction due to embolism of right middle cerebral artery: Secondary | ICD-10-CM | POA: Diagnosis not present

## 2021-11-16 NOTE — Progress Notes (Signed)
[facility] Summary Report / Loop Recorder 

## 2021-12-02 LAB — CUP PACEART REMOTE DEVICE CHECK
Date Time Interrogation Session: 20230703230357
Implantable Pulse Generator Implant Date: 20210303

## 2021-12-05 ENCOUNTER — Ambulatory Visit (INDEPENDENT_AMBULATORY_CARE_PROVIDER_SITE_OTHER): Payer: 59

## 2021-12-05 DIAGNOSIS — I63411 Cerebral infarction due to embolism of right middle cerebral artery: Secondary | ICD-10-CM | POA: Diagnosis not present

## 2021-12-31 LAB — CUP PACEART REMOTE DEVICE CHECK
Date Time Interrogation Session: 20230805231139
Implantable Pulse Generator Implant Date: 20210303

## 2022-01-03 ENCOUNTER — Encounter: Payer: 59 | Attending: Psychology | Admitting: Psychology

## 2022-01-03 DIAGNOSIS — I69398 Other sequelae of cerebral infarction: Secondary | ICD-10-CM

## 2022-01-03 DIAGNOSIS — I63411 Cerebral infarction due to embolism of right middle cerebral artery: Secondary | ICD-10-CM | POA: Diagnosis present

## 2022-01-03 DIAGNOSIS — F0630 Mood disorder due to known physiological condition, unspecified: Secondary | ICD-10-CM | POA: Diagnosis present

## 2022-01-03 DIAGNOSIS — I69919 Unspecified symptoms and signs involving cognitive functions following unspecified cerebrovascular disease: Secondary | ICD-10-CM

## 2022-01-03 DIAGNOSIS — R202 Paresthesia of skin: Secondary | ICD-10-CM | POA: Diagnosis present

## 2022-01-05 NOTE — Progress Notes (Signed)
[facility] Summary Report / Loop Recorder 

## 2022-01-09 ENCOUNTER — Ambulatory Visit (INDEPENDENT_AMBULATORY_CARE_PROVIDER_SITE_OTHER): Payer: 59

## 2022-01-09 DIAGNOSIS — I63411 Cerebral infarction due to embolism of right middle cerebral artery: Secondary | ICD-10-CM | POA: Diagnosis not present

## 2022-01-18 ENCOUNTER — Other Ambulatory Visit: Payer: Self-pay | Admitting: Family Medicine

## 2022-01-18 DIAGNOSIS — Z1231 Encounter for screening mammogram for malignant neoplasm of breast: Secondary | ICD-10-CM

## 2022-01-22 ENCOUNTER — Encounter: Payer: Self-pay | Admitting: Psychology

## 2022-01-22 NOTE — Progress Notes (Signed)
Neuropsychology Visit  Patient:  Tara [name]   DOB: 07-13-1965  MR Number: [id number]  Location: [facility] FOR PAIN AND REHABILITATIVE MEDICINE [facility] PHYSICAL MEDICINE AND REHABILITATION [address] [place], [address] [id number] [facility] [place] [address] Dept: [phone]  Date of Service: 01/03/2022  Start: 10 AM End: 11 AM Today's visit was an in person visit that was conducted in my outpatient clinic office.  The patient and myself were present for this visit.  Duration of Service: 1 Hour  Provider/Observer:     Tara Coria PsyD  Chief Complaint:      Chief Complaint  Patient presents with   Cerebrovascular Accident   Anxiety   Depression   Memory Loss    Reason For Service:      Tara [name] is a 56 year old female referred by Ihor Austin, NP with [facility] for neuropsychological consultation.  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  The patient had a cerebrovascular accident on 07/29/2019 and is continued to have difficulties with adjustment and residual cognitive deficits and changes, changes in mood and residual left-handed paresthesias.  She has significant fear about stroke and other vascular events with personal experience needs of her father dying of a heart attack when he was 19 years old and her aunt recently dying of a stroke at 29 years old.  Treatment Interventions:  Continuing to work on cognitive behavioral and other coping strategies around adapting to residual effects of her CVA.  Participation Level:   Active  Participation Quality:  Appropriate      Behavioral Observation:  Well Groomed, Alert, and Appropriate.   Current Psychosocial Factors: The patient reports that there continue to be improvements in her family and their response to her and how she has been able to communicate better with them about how to continue these  improvements.  Content of Session:   Reviewed current symptoms and continue to work on therapeutic interventions around coping and adjustment to late effects of her CVA.  Effectiveness of Interventions: Patient was fully engaged in rapport has been easily to establish.  The patient actively participates in therapeutic interventions and working on coping resources.  Target Goals:   Improved coping and adjustment issues around residual effects of her CVA.  Goals Last Reviewed:   01/03/2022  Goals Addressed Today:    We continue to work on coping and adjustment issues particular around some of the foundational issues as well as also the way she interprets changes she is experienced since her stroke and ways to better manage the impact it is having on self-esteem as well as coping with the impact it is having on her husband and his response to her.  Impression/Diagnosis:   Tara [name] is a 56 year old female referred by Ihor Austin, NP with [facility] for neuropsychological consultation.  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  The patient had a cerebrovascular accident on 07/29/2019 and is continued to have difficulties with adjustment and residual cognitive deficits and changes, changes in mood and residual left-handed paresthesias.  She has significant fear about stroke and other vascular events with personal experience needs of her father dying of a heart attack when he was 65 years old and her aunt recently dying of a stroke at 65 years old.   The patient is clearly having adjustment difficulties with residual effects of her cerebrovascular accident including significant anxiety  and depression which are exacerbating residual effects of her cerebrovascular accident that occurred in March of this year.  The patient has not been able to return to work.  The patient reports that her anxiety has improved  considerably and some of the psychosocial variables and socioeconomic issues have improved more recently.  She is also having better interactions and more effective interactions with her husband.  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 Clinical Psychologist Neuropsychologist

## 2022-02-06 ENCOUNTER — Telehealth: Payer: Self-pay | Admitting: *Deleted

## 2022-02-06 NOTE — Telephone Encounter (Signed)
Pt [name] form received on 0908/23 it takes up to 14 business days to fill out. We will call the pt once the form is completed.

## 2022-02-09 NOTE — Progress Notes (Signed)
[facility] Summary Report / Loop Recorder 

## 2022-02-09 NOTE — Telephone Encounter (Signed)
[name] form on NP's desk for review, completion, signature.

## 2022-02-13 ENCOUNTER — Ambulatory Visit (INDEPENDENT_AMBULATORY_CARE_PROVIDER_SITE_OTHER): Payer: 59

## 2022-02-13 DIAGNOSIS — I63411 Cerebral infarction due to embolism of right middle cerebral artery: Secondary | ICD-10-CM | POA: Diagnosis not present

## 2022-02-13 NOTE — Telephone Encounter (Signed)
Forms placed in MR for pick up  

## 2022-02-13 NOTE — Telephone Encounter (Signed)
Completed and placed in out box.  Thank you. 

## 2022-02-14 NOTE — Telephone Encounter (Signed)
Pt [employer] form faxed on 02/14/2022 to [phone]

## 2022-02-15 LAB — CUP PACEART REMOTE DEVICE CHECK
Date Time Interrogation Session: 20230917230729
Implantable Pulse Generator Implant Date: 20210303

## 2022-02-24 NOTE — Progress Notes (Signed)
[facility] Summary Report / Loop Recorder 

## 2022-03-20 ENCOUNTER — Ambulatory Visit (INDEPENDENT_AMBULATORY_CARE_PROVIDER_SITE_OTHER): Payer: 59

## 2022-03-20 DIAGNOSIS — I63411 Cerebral infarction due to embolism of right middle cerebral artery: Secondary | ICD-10-CM | POA: Diagnosis not present

## 2022-03-20 LAB — CUP PACEART REMOTE DEVICE CHECK
Date Time Interrogation Session: 20231020230437
Implantable Pulse Generator Implant Date: 20210303

## 2022-03-22 ENCOUNTER — Encounter: Payer: 59 | Admitting: Psychology

## 2022-03-24 ENCOUNTER — Encounter: Payer: 59 | Attending: Psychology | Admitting: Psychology

## 2022-03-24 DIAGNOSIS — I63411 Cerebral infarction due to embolism of right middle cerebral artery: Secondary | ICD-10-CM | POA: Diagnosis not present

## 2022-03-24 DIAGNOSIS — I69919 Unspecified symptoms and signs involving cognitive functions following unspecified cerebrovascular disease: Secondary | ICD-10-CM

## 2022-03-24 DIAGNOSIS — I69398 Other sequelae of cerebral infarction: Secondary | ICD-10-CM

## 2022-03-24 DIAGNOSIS — F0630 Mood disorder due to known physiological condition, unspecified: Secondary | ICD-10-CM

## 2022-03-24 DIAGNOSIS — R202 Paresthesia of skin: Secondary | ICD-10-CM

## 2022-04-07 ENCOUNTER — Ambulatory Visit
Admission: RE | Admit: 2022-04-07 | Discharge: 2022-04-07 | Disposition: A | Discharge status: Home / Self Care | Payer: 59 | Source: Ambulatory Visit | Attending: Family Medicine | Admitting: Family Medicine

## 2022-04-07 ENCOUNTER — Ambulatory Visit: Payer: 59

## 2022-04-07 DIAGNOSIS — Z1231 Encounter for screening mammogram for malignant neoplasm of breast: Secondary | ICD-10-CM

## 2022-04-10 NOTE — Progress Notes (Signed)
[facility] Summary Report / Loop Recorder 

## 2022-04-23 ENCOUNTER — Encounter: Payer: Self-pay | Admitting: Psychology

## 2022-04-23 NOTE — Progress Notes (Signed)
Neuropsychology Visit  Patient:  Tara [name]   DOB: 02-Jan-1966  MR Number: [id number]  Location: [facility] FOR PAIN AND [facility] MEDICINE [facility] PHYSICAL MEDICINE AND REHABILITATION [address] [place], [address] [id number] [facility] [place] [address] Dept: [phone]  Date of Service: 03/24/2022  Start: 1 PM End: 2 PM Today's visit was an in person visit that was conducted in my outpatient clinic office.  The patient and myself were present for this visit.  Duration of Service: 1 Hour  Provider/Observer:     Hershal Coria PsyD  Chief Complaint:      Chief Complaint  Patient presents with   Cerebrovascular Accident   Anxiety   Depression   Memory Loss    Reason For Service:      Tara [name] is a 56 year old female referred by Ihor Austin, NP with [facility] for neuropsychological consultation.  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  The patient had a cerebrovascular accident on 07/29/2019 and is continued to have difficulties with adjustment and residual cognitive deficits and changes, changes in mood and residual left-handed paresthesias.  She has significant fear about stroke and other vascular events with personal experience needs of her father dying of a heart attack when he was 52 years old and her aunt recently dying of a stroke at 49 years old.  Treatment Interventions:  Continuing to work on cognitive behavioral and other coping strategies around adapting to residual effects of her CVA.  Participation Level:   Active  Participation Quality:  Appropriate      Behavioral Observation:  Well Groomed, Alert, and Appropriate.   Current Psychosocial Factors: The patient reports that there continue to be improvements in her family and their response to her and how she has been able to communicate better with them about how to continue these  improvements.  Content of Session:   Reviewed current symptoms and continue to work on therapeutic interventions around coping and adjustment to late effects of her CVA.  Effectiveness of Interventions: Patient was fully engaged in rapport has been easily to establish.  The patient actively participates in therapeutic interventions and working on coping resources.  Target Goals:   Improved coping and adjustment issues around residual effects of her CVA.  Goals Last Reviewed:   03/24/2022  Goals Addressed Today:    We continue to work on coping and adjustment issues particular around some of the foundational issues as well as also the way she interprets changes she is experienced since her stroke and ways to better manage the impact it is having on self-esteem as well as coping with the impact it is having on her husband and his response to her.  Impression/Diagnosis:   Tara [name] is a 56 year old female referred by Ihor Austin, NP with [facility] for neuropsychological consultation.  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  The patient had a cerebrovascular accident on 07/29/2019 and is continued to have difficulties with adjustment and residual cognitive deficits and changes, changes in mood and residual left-handed paresthesias.  She has significant fear about stroke and other vascular events with personal experience needs of her father dying of a heart attack when he was 39 years old and her aunt recently dying of a stroke at 54 years old.   The patient is clearly having adjustment difficulties with residual effects of her cerebrovascular accident including significant anxiety  and depression which are exacerbating residual effects of her cerebrovascular accident that occurred in March of this year.  The patient has not been able to return to work.  The patient reports that her anxiety has improved  considerably and some of the psychosocial variables and socioeconomic issues have improved more recently.  She is also having better interactions and more effective interactions with her husband.  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 Clinical Psychologist Neuropsychologist

## 2022-04-24 ENCOUNTER — Ambulatory Visit (INDEPENDENT_AMBULATORY_CARE_PROVIDER_SITE_OTHER): Payer: 59

## 2022-04-24 DIAGNOSIS — I63411 Cerebral infarction due to embolism of right middle cerebral artery: Secondary | ICD-10-CM | POA: Diagnosis not present

## 2022-04-24 LAB — CUP PACEART REMOTE DEVICE CHECK
Date Time Interrogation Session: 20231126231110
Implantable Pulse Generator Implant Date: 20210303

## 2022-05-03 DIAGNOSIS — M25512 Pain in left shoulder: Secondary | ICD-10-CM | POA: Diagnosis not present

## 2022-05-03 DIAGNOSIS — M25511 Pain in right shoulder: Secondary | ICD-10-CM | POA: Diagnosis not present

## 2022-05-04 DIAGNOSIS — M25511 Pain in right shoulder: Secondary | ICD-10-CM | POA: Diagnosis not present

## 2022-05-04 DIAGNOSIS — M25512 Pain in left shoulder: Secondary | ICD-10-CM | POA: Diagnosis not present

## 2022-05-09 DIAGNOSIS — M25511 Pain in right shoulder: Secondary | ICD-10-CM | POA: Diagnosis not present

## 2022-05-09 DIAGNOSIS — M25512 Pain in left shoulder: Secondary | ICD-10-CM | POA: Diagnosis not present

## 2022-05-10 DIAGNOSIS — M25511 Pain in right shoulder: Secondary | ICD-10-CM | POA: Diagnosis not present

## 2022-05-10 DIAGNOSIS — M25512 Pain in left shoulder: Secondary | ICD-10-CM | POA: Diagnosis not present

## 2022-05-11 ENCOUNTER — Ambulatory Visit: Payer: Medicare Other | Admitting: Psychology

## 2022-05-16 DIAGNOSIS — M25512 Pain in left shoulder: Secondary | ICD-10-CM | POA: Diagnosis not present

## 2022-05-16 DIAGNOSIS — M25511 Pain in right shoulder: Secondary | ICD-10-CM | POA: Diagnosis not present

## 2022-05-17 DIAGNOSIS — M25512 Pain in left shoulder: Secondary | ICD-10-CM | POA: Diagnosis not present

## 2022-05-17 DIAGNOSIS — M25511 Pain in right shoulder: Secondary | ICD-10-CM | POA: Diagnosis not present

## 2022-05-24 DIAGNOSIS — M25512 Pain in left shoulder: Secondary | ICD-10-CM | POA: Diagnosis not present

## 2022-05-24 DIAGNOSIS — M25511 Pain in right shoulder: Secondary | ICD-10-CM | POA: Diagnosis not present

## 2022-05-26 DIAGNOSIS — M25511 Pain in right shoulder: Secondary | ICD-10-CM | POA: Diagnosis not present

## 2022-05-26 DIAGNOSIS — M25512 Pain in left shoulder: Secondary | ICD-10-CM | POA: Diagnosis not present

## 2022-05-30 ENCOUNTER — Ambulatory Visit (INDEPENDENT_AMBULATORY_CARE_PROVIDER_SITE_OTHER): Payer: Medicare Other

## 2022-05-30 DIAGNOSIS — I63411 Cerebral infarction due to embolism of right middle cerebral artery: Secondary | ICD-10-CM

## 2022-05-30 LAB — CUP PACEART REMOTE DEVICE CHECK
Date Time Interrogation Session: 20231229230556
Implantable Pulse Generator Implant Date: 20210303

## 2022-05-31 DIAGNOSIS — M25511 Pain in right shoulder: Secondary | ICD-10-CM | POA: Diagnosis not present

## 2022-05-31 DIAGNOSIS — M25512 Pain in left shoulder: Secondary | ICD-10-CM | POA: Diagnosis not present

## 2022-06-01 NOTE — Progress Notes (Signed)
[facility] Summary Report / Loop Recorder 

## 2022-06-02 DIAGNOSIS — M25512 Pain in left shoulder: Secondary | ICD-10-CM | POA: Diagnosis not present

## 2022-06-02 DIAGNOSIS — M25511 Pain in right shoulder: Secondary | ICD-10-CM | POA: Diagnosis not present

## 2022-06-07 DIAGNOSIS — M25512 Pain in left shoulder: Secondary | ICD-10-CM | POA: Diagnosis not present

## 2022-06-07 DIAGNOSIS — M25511 Pain in right shoulder: Secondary | ICD-10-CM | POA: Diagnosis not present

## 2022-06-08 ENCOUNTER — Telehealth: Payer: Self-pay | Admitting: *Deleted

## 2022-06-08 DIAGNOSIS — M25512 Pain in left shoulder: Secondary | ICD-10-CM | POA: Diagnosis not present

## 2022-06-08 DIAGNOSIS — M25511 Pain in right shoulder: Secondary | ICD-10-CM | POA: Diagnosis not present

## 2022-06-08 NOTE — Telephone Encounter (Signed)
Fax received by [place] notifying the office of the below:  Cpap switch out  Patient Cpap was not working correctly. S/O to Remed S11 S/N [id number]. Changed in airview and sent an email to physician office.

## 2022-06-19 DIAGNOSIS — M25512 Pain in left shoulder: Secondary | ICD-10-CM | POA: Diagnosis not present

## 2022-06-19 DIAGNOSIS — M25511 Pain in right shoulder: Secondary | ICD-10-CM | POA: Diagnosis not present

## 2022-06-22 DIAGNOSIS — M25512 Pain in left shoulder: Secondary | ICD-10-CM | POA: Diagnosis not present

## 2022-06-22 DIAGNOSIS — M25511 Pain in right shoulder: Secondary | ICD-10-CM | POA: Diagnosis not present

## 2022-06-23 NOTE — Progress Notes (Signed)
Carelink Summary Report / Loop Recorder

## 2022-06-27 DIAGNOSIS — M25512 Pain in left shoulder: Secondary | ICD-10-CM | POA: Diagnosis not present

## 2022-06-27 DIAGNOSIS — M25511 Pain in right shoulder: Secondary | ICD-10-CM | POA: Diagnosis not present

## 2022-06-28 DIAGNOSIS — M25511 Pain in right shoulder: Secondary | ICD-10-CM | POA: Diagnosis not present

## 2022-06-28 DIAGNOSIS — M25512 Pain in left shoulder: Secondary | ICD-10-CM | POA: Diagnosis not present

## 2022-06-29 LAB — CUP PACEART REMOTE DEVICE CHECK
Date Time Interrogation Session: 20240131230836
Implantable Pulse Generator Implant Date: 20210303

## 2022-06-30 DIAGNOSIS — M25511 Pain in right shoulder: Secondary | ICD-10-CM | POA: Diagnosis not present

## 2022-07-03 ENCOUNTER — Ambulatory Visit: Payer: Medicare Other

## 2022-07-03 DIAGNOSIS — I63411 Cerebral infarction due to embolism of right middle cerebral artery: Secondary | ICD-10-CM

## 2022-07-18 DIAGNOSIS — M25511 Pain in right shoulder: Secondary | ICD-10-CM | POA: Diagnosis not present

## 2022-07-18 DIAGNOSIS — M25512 Pain in left shoulder: Secondary | ICD-10-CM | POA: Diagnosis not present

## 2022-08-07 ENCOUNTER — Ambulatory Visit: Payer: Medicare Other

## 2022-08-07 ENCOUNTER — Telehealth: Payer: Self-pay

## 2022-08-07 DIAGNOSIS — I63411 Cerebral infarction due to embolism of right middle cerebral artery: Secondary | ICD-10-CM | POA: Diagnosis not present

## 2022-08-07 LAB — CUP PACEART REMOTE DEVICE CHECK
Date Time Interrogation Session: 20240310230611
Implantable Pulse Generator Implant Date: 20210303

## 2022-08-07 NOTE — Telephone Encounter (Signed)
Attempted to reach patient regarding appt with Burt Knack. We have not seen her since 2021, but she did do her one year post-PFO closure appt with Nell Range, PA on 05/04/21 and advised to follow-up as needed. Left message asking that she call office back.

## 2022-08-07 NOTE — Telephone Encounter (Signed)
Received a call from the patient asking if Dr. Burt Knack had received her remote checks and if she had any events on 3/4. Explained to her that unfortunately that last remote transmission I see is from 06/28/22, but one was scheduled for today. Reiterated to her that a message will be sent to the Device Team for assistance.  Gave her the office number per her request.

## 2022-08-07 NOTE — Telephone Encounter (Signed)
Returned call to Pt.  Per Pt on July 31, 2022 she noted her heart rate was up to 140 bpm and was advised that is was irregular.  Advised Pt that her device updated nightly.  Advised her loop recorder had not detected atrial fibrillation since implant.  Pt states she also had chest pressure with the episode on July 31, 2022.  She is requesting an appointment with Dr. Burt Knack.  She refused to see APP.  Attempted to schedule appt, but unable to find any openings.  Pt would like a call back to schedule follow up with Dr. Burt Knack.

## 2022-08-08 DIAGNOSIS — M25511 Pain in right shoulder: Secondary | ICD-10-CM | POA: Diagnosis not present

## 2022-08-08 DIAGNOSIS — M25512 Pain in left shoulder: Secondary | ICD-10-CM | POA: Diagnosis not present

## 2022-08-10 DIAGNOSIS — M25512 Pain in left shoulder: Secondary | ICD-10-CM | POA: Diagnosis not present

## 2022-08-10 DIAGNOSIS — M25511 Pain in right shoulder: Secondary | ICD-10-CM | POA: Diagnosis not present

## 2022-08-11 NOTE — Telephone Encounter (Signed)
Returned call to patient who states that she had an unusual incident on march 4th while riding a bus. Stated when they stopped for lunch, she began sweating profusely, skipping heartbeats, with associated chest pressure that later that day radiated to L side. States HR ranged from 45-173bpm. States the episode lasted approx 3 hours. Per device clinic, no A-fib on ILR. She doesn't feel like she over-exerted herself, but did do a lot more walking than she normally would have. She tried pepcid when she got back home, not much relief. Confirms that this was the first episode to occur and hasn't happened since. She doesn't have a smartwatch that could've done EKG tracings. States her BP is always low and was only 5-10 points higher during the episode. Advised her to call back if happens again. Offered her appt, but explained that there wouldn't be much we can actually do for her. Educated on some vagal maneuvers to try should this recur. She appreciates call, no further questions.

## 2022-08-11 NOTE — Progress Notes (Signed)
[facility] Summary Report / Loop Recorder 

## 2022-08-14 NOTE — Progress Notes (Deleted)
[facility] [address] [place] [address] [phone]       OFFICE FOLLOW UP NOTE  Ms. Dairy Wisser Date of Birth:  1965-08-03 Medical Record Number:  [id number]   Reason for visit: Stroke follow-up    CHIEF COMPLAINT:  No chief complaint on file.    HPI:   Update 08/15/2022 JM: Patient returns for yearly stroke and CPAP follow-up.  Stable from stroke standpoint without any new stroke/TIA symptoms.  Residual deficits of occasional dizziness, cognitive impairment and anxiety stable.  She continues to routinely follow with Dr. Sima Matas.  Compliant on aspirin and Crestor.  Blood pressure well-controlled.  Routinely follows with PCP.  Received new CPAP machine back in January as her old machine stopped working correctly.  Review of compliance report over the past 30 days shows 26 out of 30 usage days with 16 days greater than 4 hours for 53% compliance.  Average usage 4 hours and 31 minutes.  Residual AHI 0.8 on pressure settings of 6-14 with EPR level 2.  Pressure in the 95 percentile 10.0.  Leaks in the 95th percentile 22.0.      History provided for reference purposes only Update 08/23/2021 JM: Patient returns for 3-month stroke follow-up.  Overall stable from stroke standpoint.  Reports residual occasional dizziness, cognitive impairment and anxiety - denies any worsening. Continues to ambulate with a walking stick, no recent falls.  She has finally been approved for social security disability which is a large stress reliever for her!!  She continues to follow with Dr. 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  Denies new stroke/TIA symptoms.  Compliant on aspirin and Crestor, denies side effects.  Blood pressure today 111/76. Loop recorder has not shown atrial fibrillation thus far.  Reports nightly use of CPAP. Reports improved tolerance since pressure setting  adjustments at prior visit. Repeat download report showed improved usage with 27/30 usage days and 21 days greater than 4 hours for 70% compliance with residual AHI 1.0. No further concerns at this time.  Update 07/12/2021 JM: Patient returns for initial CPAP compliance visit.  CPAP initiated 04/29/2021.  Review of compliance report from 06/12/2021 -07/11/2021 shows 27 out of 30 usage days with 17 days greater than 4 hours for 57% compliance.  Average usage is 4 hours and 36 minutes.  Residual AHI 1.6 with setting pressure from 6-16cm H2O and EPR level 3.  Pressure in the 93rd percentile 12.2.  Leaks in the 95th percentile 11.6.   Having some difficulty tolerating mask. She will wake up in the middle the night with the mask leaking and high pressure causing intolerance. Also reports dry mouth.  Currently using full facemask.  Initially tried nasal mask but was switched to full face mask due to mouth opening.  Epworth Sleepiness Scale 11.  Fatigue severity scale 53.   Update 12/27/2020 JM: Ms. Rojo returns for 5-month routine follow-up previously seen on 07/06/2020.  Overall stable.  Reports residual cognitive and gait impairment.  She remains in the process of applying for Social Security disability and currently on long-term disability through her former employer managed by PCP.  She continues to follow with Dr. 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  Denies new stroke/TIA symptoms.  Compliant on aspirin and Crestor without associated side effects.  Blood pressure today 114/74.  Loop recorder has not shown atrial fibrillation thus far.  She  has not yet received her CPAP machine reports previously being contacted by DME in April and was told they were still on back order and would be called when available.  Continues to follow with orthopedics for bilateral carpal tunnel and currently getting a quote from office for price of procedure.  No new concerns at this time.  Update 07/06/2020 JM: Ms. Welson returns for 31-month stroke follow-up.  Overall stable from stroke standpoint without new stroke/TIA symptoms and reports residual cognitive impairment and gait impairment. Use of cane at times - denies any recent falls.she also continues to experience anxiety and depression poststroke and has been followed by Dr. Sima Matas which she believes has been beneficial.  She remains on long term disability and still in the process of applying for social security disability. Loop recorder has not shown atrial fibrillation thus far.    Underwent sleep study due to continued complaints of excessive daytime fatigue which was completed on 04/12/2020 which showed evidence of sleep apnea and recommend initiation of CPAP.   She is currently awaiting CPAP machine as this has been on back order.   She also underwent PFO closure successfully without complication on [id number] and recommended aspirin and Plavix for 25-month duration. She has remained on aspirin and plavix but does report increased bruising therefore started to take aspirin 81mg  every other day and has continued on plavix daily - denies any concerning bleeding.  She has not further discuss this with cardiology.  Complains of left hand numbness with EMG/NCV completed 03/17/2020 to further evaluate for possible underlying carpal tunnel or post stroke deficit.  Study showed bilateral moderately to severe L>R carpal tunnel syndrome therefore referral placed to Ortho for further treatment options.  They plan on proceeding with release procedure but currently holding until she is able to come off Plavix (around 09/2020). She did receive cortisone injections with improvement of severe pain (not waking her up at night) but continues to feel numbness. She also uses braces at night time as instructed by ortho.   She also reports episode last night of waking up with substernal pressure.  This pain did not radiate  and denies any shortness of breath, nausea/vomiting, diaphoresis or palpitations.  Symptoms improved after she propped 3 pillows behind her - reports lasting for approximately 3 hours.  She denies any reoccurrence or prior symptoms.  Update 03/01/2020 JM: Ms. Leverone returns for 73-month stroke follow-up unaccompanied.  Residual deficits of cognitive impairment, dysarthria, imbalance and left hand weakness and numbness as well as continued excessive fatigue.  She recently completed speech therapy as she met stated rehab goals but due to continued cognitive impairment and poststroke anxiety/depression, she has initial evaluation with Dr. Sima Matas 10/13.  Cognition impairment with visual difficulty with high-level functioning, short-term recall, attention, processing and visual-spatial skills.  She also feels overwhelmed in crowded areas such as grocery store or needing to complete a more complex task.  She does report improvement of dizziness/imbalance with less frequency as before - will continue to feel with quick head movements and bending over.  Left hand paresthesias intermittent typically occurring with increased use of left hand or the middle of the night with burning sensation, numbness/tingling and "achy" feeling.  Symptoms present 3rd and 4th digit of left hand radiating in the middle of her wrist and stopping 1-2" below elbow.  Excessive daytime fatigue persists despite sleeping well through the night.  Does report occasional snoring.  She remains on long term disability and in the process of completing  social security disability.  Remains on aspirin and Crestor for secondary stroke prevention without side effects.  Blood pressure today 119/84.  Loop recorder has not shown atrial fibrillation thus far.  No further concerns at this time.  Update 11/18/2019 JM: Ms. Chokshi returns for follow-up regarding cryptogenic right parietal stroke in 07/2019.  Residual deficits of mild cognitive impairment, left hand  weakness, fatigue with decreased activity tolerance and dizziness/imbalance.  Continues to participate in speech therapy for impairments of attention, processing and visual-spatial skills as well as dysarthria with reported ongoing improvement.  Continues to work with PT for gait training, environmental scanning, and balance and dizziness progressing towards goals.  Continues to work with OT for improvement of LUE fine motor control and coordination and environmental standing/dynamic balance.  She does report ongoing difficulty concentrating especially when having multiple tasks to complete.  Also reports difficulty going into crowded places such as stores as she feels visually overstimulated and starts to feel anxious.  She denies anxiety or depression but based on PHQ-9 of 16 likely underlying depression.  Becomes tearful during visit as she is frustrated with ongoing deficits and feels as though she should have recovered more than what she has.  Reports left hand numbness/tingling located left middle and ring finger with radiation to elbow can wake her up in the middle of the night or become present with increased hand use. 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  Remains on aspirin and Crestor for secondary stroke prevention. Recent lipid panel by PCP showed LDL 53.  Blood pressure today 113/78.  Loop recorder is not shown atrial fibrillation thus far.  No further concerns at this time.  Update 09/29/2019 Dr. Leonie Man ; patient is seen upon request that she call the office requesting medical leave be prolonged as she is having cognitive difficulties and fatigue and dizziness.  She states that she has been getting outpatient physical occupational therapy and but complains of a transient positional dizziness when she looks down or turns her neck quickly.  She is been getting some vestibular therapy which has been helping.  She was recently  referred to speech therapy for mental status slowness and progressing with appointment has not happened yet.  She feels overall there is slow and steady progress but she is not ready to return to work yet.  She wants to be out of work till her next assessment with her primary physician Dr. Jacelyn Grip which is scheduled for June 6.  She has not had any recurrent stroke or TIA symptoms.  She does complain of some intermittent numbness and tightness in her middle 2 fingers in the left hand particularly when she is tired.  She is tolerating aspirin well without bruising or bleeding.  Blood pressures well controlled today it is 116/77.  She had to reduce the dose of Crestor to 20 mg that she had some muscle aches and pains which appear not to have improved.  She does take coenzyme Q 10 as well.  She denies any headache, slurred speech, increasing gait or balance problems.  Initial visit 08/13/2019 JM: Ms. Parrent is a 57 year old female who is being seen today for hospital follow-up.  Residual stroke deficits of mild left-sided weakness, dizziness with quick head movements and unsteadiness on feet. She does endorse some improvement but recently just started therapies. She does endorse increased fatigue since discharge and feels like her mind is slower/delayed.  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  She initially had FMLA which expired on 3/15 and she is requesting for extension.  She is eager to continue with therapies for ongoing improvement.  She continues on DAPT but will be completing 3-week Plavix course in the near future.  Denies bleeding or bruising.  She continues on Crestor 40 mg daily but does endorse myalgias bilateral upper extremities and torso.  Blood pressure today 118/88.  Loop recorder has not shown atrial fibrillation thus far.  No further concerns at this time.  Stroke admission 07/29/2019: Ms. JOZLIN SCHUTH is a 57 y.o. female with history of HLD, migraine and  asthma who presented with HA since 2/28 who woke w/ LUE numbness and weakness, severe dysarthria and dysphagia on 07/29/2019.  Evaluated by stroke team and Dr. Leonie Man with stroke work-up revealing 2 tiny right parietal infarcts embolic secondary to unknown source.  In addition to acute infarcts, MRI also showed several small chronic infarcts in the cerebellum but per review, Dr Leonie Man these were likely flow-voids and not old strokes.  TEE did not show evidence of PFO.  Recommended further evaluation with TCD which was suggestive of medium to large PFO.  Recommended further discussion outpatient regarding possible PFO closure.  Loop recorder placed to rule out atrial fibrillation as possible cause of stroke.  Hypercoagulable work-up negative.  Recommended DAPT for 3 weeks and aspirin alone.  LDL 151 and recommended increasing Crestor frequency to 40 mg daily.  No history of HTN or DM.  Other stroke risk factors include migraines but no prior history of stroke.  Discharged home in stable condition without therapy needs.  Stroke:   2 tiny R parietal  infarcts embolic secondary to unknown source.Marland Kitchen PFO unclear if related to her stroke ROPE score 6 ) 62% chance pfo is related to stroke ) Code Stroke CT head hyperdense R MCA bifurcation. No acute abnormality. R maxillary sinus dz. ASPECTS 10.    CTA head & neck no LVO. Mild distal SMALL VESSEL DISEASE anterior and posterior circulation. Neck ok  CT perfusion Unremarkable  MRI  2 tiny acute cortical R brain (posterior middle frontal gyrus and posterior R parietal lobe). Read as old cerebellar infarcts but  Dr. Leonie Man feels they are flow voids and not old stroke. Mild paranasal sinus dz.  2D Echo normal  TEE  PFO present EEG normal  LE dopplers no DVT  LDL 151 mg% HgbA1c 5.3 UDS not done  TSH normal    ROS:   14 system review of systems performed and negative with exception of those listed in HPI      PMH:  Past Medical History:  Diagnosis Date    Asthma    Hyperlipidemia    Migraine    Rotator cuff tear    Spondylolisthesis    L4-5    PSH:  Past Surgical History:  Procedure Laterality Date   BUBBLE STUDY  07/30/2019   Procedure: BUBBLE STUDY;  Surgeon: Donato Heinz, MD;  Location: [facility] ENDOSCOPY;  Service: Cardiovascular;;   LOOP RECORDER INSERTION N/A 07/30/2019   Procedure: LOOP RECORDER INSERTION;  Surgeon: Evans Lance, MD;  Location: [place] LAB;  Service: Cardiovascular;  Laterality: N/A;   No prior surgery     PATENT FORAMEN OVALE(PFO) CLOSURE N/A 04/19/2020   Procedure: PATENT FORAMEN OVALE (PFO) CLOSURE;  Surgeon: Sherren Mocha, MD;  Location: [place] [facility];  Service: Cardiovascular;  Laterality: N/A;  TEE WITHOUT CARDIOVERSION N/A 07/30/2019   Procedure: TRANSESOPHAGEAL ECHOCARDIOGRAM (TEE);  Surgeon: Donato Heinz, MD;  Location: [facility] ENDOSCOPY;  Service: Cardiovascular;  Laterality: N/A;    Social History:  Social History   Socioeconomic History   Marital status: Married    Spouse name: Not on file   Number of children: 2   Years of education: BSN   Highest education level: Not on file  Occupational History   Occupation: [employer]  Tobacco Use   Smoking status: Never   Smokeless tobacco: Never  Substance and Sexual Activity   Alcohol use: No    Alcohol/week: 0.0 standard drinks of alcohol    Comment: Rare   Drug use: No   Sexual activity: Yes    Birth control/protection: None  Other Topics Concern   Not on file  Social History Narrative   Lives at home w/ her husband and son   Right-handed   Caffeine: seldom, soda on the weekend   Social Determinants of Health   Financial Resource Strain: Not on file  Food Insecurity: Not on file  Transportation Needs: Not on file  Physical Activity: Not on file  Stress: Not on file  Social Connections: Not on file  Intimate Partner Violence: Not on file    Family History:  Family History  Problem Relation Age of Onset    Heart disease Father 51       MI   Hypertension Maternal Grandmother    Hyperlipidemia Brother     Medications:   Current Outpatient Medications on File Prior to Visit  Medication Sig Dispense Refill   aspirin EC 81 MG EC tablet Take 1 tablet (81 mg total) by mouth daily.     cholecalciferol (VITAMIN D) 1000 UNITS tablet Take 1,000 Units by mouth daily.     Coenzyme Q10 (COQ10 PO) Take 1 tablet by mouth daily.      COVID-19 mRNA bivalent vaccine, Moderna, (MODERNA COVID-19 BIVAL BOOSTER) 50 MCG/0.5ML injection Inject into the muscle. 0.5 mL 0   Omega-3 Fatty Acids (FISH OIL) 500 MG CAPS Natures made Fish oil pearls 500mg  BID 180 capsule 1   Probiotic Product (PROBIOTIC PO) Take 1 capsule by mouth daily.      rizatriptan (MAXALT) 10 MG tablet Take 10 mg by mouth as needed for migraine (headache).      rosuvastatin (CRESTOR) 20 MG tablet TAKE 1 TABLET BY MOUTH ONCE A DAY 90 tablet 1   scopolamine (TRANSDERM-SCOP) 1 MG/3DAYS Place 1 patch onto the skin daily as needed (Motion sickness).      No current facility-administered medications on file prior to visit.    Allergies:   Allergies  Allergen Reactions   Crestor [Rosuvastatin] Other (See Comments)    Muscle pain with 40 mg   Cyclobenzaprine Other (See Comments)    "loopy"/ Can take 0.5 dose   Tape Rash    adhesive     Physical Exam  There were no vitals filed for this visit.     There is no height or weight on file to calculate BMI. No results found.  General: well developed, well nourished, pleasant middle-age female, seated, in no evident distress Head: head normocephalic and atraumatic.   Neck: supple with no carotid or supraclavicular bruits Cardiovascular: regular rate and rhythm, no murmurs Musculoskeletal: no deformity Skin:  no rash/petichiae Vascular:  Normal pulses all extremities   Neurologic Exam Mental Status: Awake and fully alert.  Occasional decreased volume with occasional speech hesitancy.   Oriented  to place and time. Recent memory impaired and remote memory intact. Attention span, concentration and fund of knowledge appropriate during visit. Mood and affect appropriate.   Cranial Nerves:  Pupils equal, briskly reactive to light. Extraocular movements full without nystagmus. Visual fields full to confrontation. Hearing intact. Decreased sensory left lower face. Left nasolabial fold flatting. tongue, palate moves normally and symmetrically.  Motor: Normal bulk and tone. Normal strength in all tested extremity muscles except mild diminished fine finger movements on the left  Sensory.: slight decreased light touch, pinprick and vibratory sensation LUE and LLE compared to right side Coordination: Rapid alternating movements normal in all extremities except decreased left hand. Finger-to-nose and heel-to-shin performed accurately bilaterally. Gait and Station: Arises from chair without difficulty. Stance is normal. Gait demonstrates normal stride length with mild imbalance and unsteadiness and use of walking stick Reflexes: 1+ and symmetric. Toes downgoing.        ASSESSMENT/PLAN: Tara [name] is a 57 y.o. year old female with right parietal infarcts x2 on 07/29/2019 secondary to unknown source s/p loop recorder as well as evidence of PFO s/p closure 03/2020 with residual left hemisensory deficit, gait impairment, dizziness, vertigo impairment and anxiety/depression.  Vascular risk factors include HLD, migraines and OSA on CPAP.       R parietal strokes, cryptogenic -stable - now approved for Social Security disability -Continue to follow with Dr. Sima Matas for poststroke anxiety/depression -Loop recorder has not shown atrial fibrillation thus far -Continue aspirin and Crestor for secondary stroke prevention measures -Continue close PCP follow-up for aggressive stroke risk factor management including HLD with LDL goal<70    OSA on CPAP -Low >4 hour compliance although  optimal residual AHI over the past 30 days.  Discussed importance of increasing nightly usage to greater than 4 hours per night for optimal benefit and per insurance requirements.  -Continue current pressure setting of 6-14 with EPR level 2 -Received new CPAP device 05/2022 as her prior device stopped working correctly -Continue to follow with DME needed supplies or CPAP related concerns     Follow-up in 1 year or call earlier if needed    CC:  Vernie Shanks, MD (Inactive)    I spent 36 minutes of face-to-face and non-face-to-face time with patient.  This included previsit chart review, lab review, study review, order entry, electronic health record documentation, patient education regarding prior stroke with residual deficits, secondary stroke prevention measures and aggressive stroke risk factor management, use of CPAP for OSA management and review and discussion of CPAP compliance report and answered all other questions to patient's satisfaction  Frann Rider, AGNP-BC  [facility] [address] [place], [place] [address]  Phone [phone] Fax [phone] Note: This document was prepared with digital dictation and possible smart phrase technology. Any transcriptional errors that result from this process are unintentional.

## 2022-08-15 ENCOUNTER — Ambulatory Visit: Payer: Medicare Other | Admitting: Adult Health

## 2022-08-15 DIAGNOSIS — M25511 Pain in right shoulder: Secondary | ICD-10-CM | POA: Diagnosis not present

## 2022-08-15 DIAGNOSIS — M25512 Pain in left shoulder: Secondary | ICD-10-CM | POA: Diagnosis not present

## 2022-08-17 DIAGNOSIS — M25511 Pain in right shoulder: Secondary | ICD-10-CM | POA: Diagnosis not present

## 2022-08-17 DIAGNOSIS — M25512 Pain in left shoulder: Secondary | ICD-10-CM | POA: Diagnosis not present

## 2022-08-24 ENCOUNTER — Ambulatory Visit: Payer: Medicare Other | Admitting: Adult Health

## 2022-08-29 DIAGNOSIS — M25512 Pain in left shoulder: Secondary | ICD-10-CM | POA: Diagnosis not present

## 2022-08-29 DIAGNOSIS — M25511 Pain in right shoulder: Secondary | ICD-10-CM | POA: Diagnosis not present

## 2022-08-29 NOTE — Progress Notes (Deleted)
[facility] [address] [place]. [place] [address] [phone]       OFFICE FOLLOW UP NOTE  [name] Date of Birth:  1965/08/21 Medical Record Number:  [id number]   Reason for visit: Stroke follow-up    CHIEF COMPLAINT:  No chief complaint on file.    HPI:   Update 08/15/2022 JM: Tara [name] returns for yearly stroke and CPAP follow-up.  Stable from stroke standpoint without any new stroke/TIA symptoms.  Residual deficits of occasional dizziness, cognitive impairment and anxiety stable.  She continues to routinely follow with Dr. Sima Matas.  Compliant on aspirin and Crestor.  Blood pressure well-controlled.  Routinely follows with PCP.  Received new CPAP machine back in January as her old machine stopped working correctly.  Compliance report over the past 30 days shows 25 out of 30 usage days with 18 days greater than 4 hours for 60% compliance.  Average usage 4 hours and 56 minutes.  Residual AHI 0.9.  Pressure in the 95th percentile 10.1 on pressure settings of 6-14 with EPR level 2.  Leaks in the 95th percentile 32.1.     History provided for reference purposes only Update 08/23/2021 JM: Tara [name] returns for 61-month stroke follow-up.  Overall stable from stroke standpoint.  Reports residual occasional dizziness, cognitive impairment and anxiety - denies any worsening. Continues to ambulate with a walking stick, no recent falls.  She has finally been approved for social security disability which is a large stress reliever for her!!  She continues to follow with Dr. 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  Denies new stroke/TIA symptoms.  Compliant on aspirin and Crestor, denies side effects.  Blood pressure today 111/76. Loop recorder has not shown atrial fibrillation thus far.  Reports nightly use of CPAP. Reports improved tolerance since pressure setting adjustments at  prior visit. Repeat download report showed improved usage with 27/30 usage days and 21 days greater than 4 hours for 70% compliance with residual AHI 1.0. No further concerns at this time.  Update 07/12/2021 JM: Tara [name] returns for initial CPAP compliance visit.  CPAP initiated 04/29/2021.  Review of compliance report from 06/12/2021 -07/11/2021 shows 27 out of 30 usage days with 17 days greater than 4 hours for 57% compliance.  Average usage is 4 hours and 36 minutes.  Residual AHI 1.6 with setting pressure from 6-16cm H2O and EPR level 3.  Pressure in the 93rd percentile 12.2.  Leaks in the 95th percentile 11.6.   Having some difficulty tolerating mask. She will wake up in the middle the night with the mask leaking and high pressure causing intolerance. Also reports dry mouth.  Currently using full facemask.  Initially tried nasal mask but was switched to full face mask due to mouth opening.  Epworth Sleepiness Scale 11.  Fatigue severity scale 53.   Update 12/27/2020 JM: Tara [name] returns for 5-month routine follow-up previously seen on 07/06/2020.  Overall stable.  Reports residual cognitive and gait impairment.  She remains in the process of applying for Social Security disability and currently on long-term disability through her former employer managed by PCP.  She continues to follow with Dr. 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  Denies new stroke/TIA symptoms.  Compliant on aspirin and Crestor without associated side effects.  Blood pressure today 114/74.  Loop recorder has not shown atrial fibrillation thus far.  She has not yet  received her CPAP machine reports previously being contacted by DME in April and was told they were still on back order and would be called when available.  Continues to follow with orthopedics for bilateral carpal tunnel and currently getting a quote from office for price of procedure. No new  concerns at this time.  Update 07/06/2020 JM: Tara [name] returns for 69-month stroke follow-up.  Overall stable from stroke standpoint without new stroke/TIA symptoms and reports residual cognitive impairment and gait impairment. Use of cane at times - denies any recent falls.she also continues to experience anxiety and depression poststroke and has been followed by Dr. Sima Matas which she believes has been beneficial.  She remains on long term disability and still in the process of applying for social security disability. Loop recorder has not shown atrial fibrillation thus far.    Underwent sleep study due to continued complaints of excessive daytime fatigue which was completed on 04/12/2020 which showed evidence of sleep apnea and recommend initiation of CPAP.   She is currently awaiting CPAP machine as this has been on back order.   She also underwent PFO closure successfully without complication on [id number] and recommended aspirin and Plavix for 7-month duration. She has remained on aspirin and plavix but does report increased bruising therefore started to take aspirin 81mg  every other day and has continued on plavix daily - denies any concerning bleeding.  She has not further discuss this with cardiology.  Complains of left hand numbness with EMG/NCV completed 03/17/2020 to further evaluate for possible underlying carpal tunnel or post stroke deficit.  Study showed bilateral moderately to severe L>R carpal tunnel syndrome therefore referral placed to Ortho for further treatment options.  They plan on proceeding with release procedure but currently holding until she is able to come off Plavix (around 09/2020). She did receive cortisone injections with improvement of severe pain (not waking her up at night) but continues to feel numbness. She also uses braces at night time as instructed by ortho.   She also reports episode last night of waking up with substernal pressure.  This pain did not radiate and  denies any shortness of breath, nausea/vomiting, diaphoresis or palpitations.  Symptoms improved after she propped 3 pillows behind her - reports lasting for approximately 3 hours.  She denies any reoccurrence or prior symptoms.  Update 03/01/2020 JM: Tara [name] returns for 35-month stroke follow-up unaccompanied.  Residual deficits of cognitive impairment, dysarthria, imbalance and left hand weakness and numbness as well as continued excessive fatigue.  She recently completed speech therapy as she met stated rehab goals but due to continued cognitive impairment and poststroke anxiety/depression, she has initial evaluation with Dr. Sima Matas 10/13.  Cognition impairment with visual difficulty with high-level functioning, short-term recall, attention, processing and visual-spatial skills.  She also feels overwhelmed in crowded areas such as grocery store or needing to complete a more complex task.  She does report improvement of dizziness/imbalance with less frequency as before - will continue to feel with quick head movements and bending over.  Left hand paresthesias intermittent typically occurring with increased use of left hand or the middle of the night with burning sensation, numbness/tingling and "achy" feeling.  Symptoms present 3rd and 4th digit of left hand radiating in the middle of her wrist and stopping 1-2" below elbow.  Excessive daytime fatigue persists despite sleeping well through the night.  Does report occasional snoring.  She remains on long term disability and in the process of completing social security disability.  Remains on aspirin and Crestor for secondary stroke prevention without side effects.  Blood pressure today 119/84.  Loop recorder has not shown atrial fibrillation thus far.  No further concerns at this time.  Update 11/18/2019 JM: Tara [name] returns for follow-up regarding cryptogenic right parietal stroke in 07/2019.  Residual deficits of mild cognitive impairment, left hand  weakness, fatigue with decreased activity tolerance and dizziness/imbalance.  Continues to participate in speech therapy for impairments of attention, processing and visual-spatial skills as well as dysarthria with reported ongoing improvement.  Continues to work with PT for gait training, environmental scanning, and balance and dizziness progressing towards goals.  Continues to work with OT for improvement of LUE fine motor control and coordination and environmental standing/dynamic balance.  She does report ongoing difficulty concentrating especially when having multiple tasks to complete.  Also reports difficulty going into crowded places such as stores as she feels visually overstimulated and starts to feel anxious.  She denies anxiety or depression but based on PHQ-9 of 16 likely underlying depression.  Becomes tearful during visit as she is frustrated with ongoing deficits and feels as though she should have recovered more than what she has.  Reports left hand numbness/tingling located left middle and ring finger with radiation to elbow can wake her up in the middle of the night or become present with increased hand use. 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  Remains on aspirin and Crestor for secondary stroke prevention. Recent lipid panel by PCP showed LDL 53.  Blood pressure today 113/78.  Loop recorder is not shown atrial fibrillation thus far.  No further concerns at this time.  Update 09/29/2019 Dr. Leonie Man ; Tara [name] is seen upon request that she call the office requesting medical leave be prolonged as she is having cognitive difficulties and fatigue and dizziness.  She states that she has been getting outpatient physical occupational therapy and but complains of a transient positional dizziness when she looks down or turns her neck quickly.  She is been getting some vestibular therapy which has been helping.  She was recently  referred to speech therapy for mental status slowness and progressing with appointment has not happened yet.  She feels overall there is slow and steady progress but she is not ready to return to work yet.  She wants to be out of work till her next assessment with her primary physician Dr. Jacelyn Grip which is scheduled for June 6.  She has not had any recurrent stroke or TIA symptoms.  She does complain of some intermittent numbness and tightness in her middle 2 fingers in the left hand particularly when she is tired.  She is tolerating aspirin well without bruising or bleeding.  Blood pressures well controlled today it is 116/77.  She had to reduce the dose of Crestor to 20 mg that she had some muscle aches and pains which appear not to have improved.  She does take coenzyme Q 10 as well.  She denies any headache, slurred speech, increasing gait or balance problems.  Initial visit 08/13/2019 JM: Tara [name] is a 57 year old female who is being seen today for hospital follow-up.  Residual stroke deficits of mild left-sided weakness, dizziness with quick head movements and unsteadiness on feet. She does endorse some improvement but recently just started therapies. She does endorse increased fatigue since discharge and feels like her mind is slower/delayed.  She has not returned back to  work yet currently has a Tourist information centre manager for [employer].  She initially had FMLA which expired on 3/15 and she is requesting for extension.  She is eager to continue with therapies for ongoing improvement.  She continues on DAPT but will be completing 3-week Plavix course in the near future.  Denies bleeding or bruising.  She continues on Crestor 40 mg daily but does endorse myalgias bilateral upper extremities and torso.  Blood pressure today 118/88.  Loop recorder has not shown atrial fibrillation thus far.  No further concerns at this time.  Stroke admission 07/29/2019: Tara [name] is a 57 y.o. female with history of HLD, migraine and  asthma who presented with HA since 2/28 who woke w/ LUE numbness and weakness, severe dysarthria and dysphagia on 07/29/2019.  Evaluated by stroke team and Dr. Leonie Man with stroke work-up revealing 2 tiny right parietal infarcts embolic secondary to unknown source.  In addition to acute infarcts, MRI also showed several small chronic infarcts in the cerebellum but per review, Dr Leonie Man these were likely flow-voids and not old strokes.  TEE did not show evidence of PFO.  Recommended further evaluation with TCD which was suggestive of medium to large PFO.  Recommended further discussion outpatient regarding possible PFO closure.  Loop recorder placed to rule out atrial fibrillation as possible cause of stroke.  Hypercoagulable work-up negative.  Recommended DAPT for 3 weeks and aspirin alone.  LDL 151 and recommended increasing Crestor frequency to 40 mg daily.  No history of HTN or DM.  Other stroke risk factors include migraines but no prior history of stroke.  Discharged home in stable condition without therapy needs.  Stroke:   2 tiny R parietal  infarcts embolic secondary to unknown source.Marland Kitchen PFO unclear if related to her stroke ROPE score 6 ) 62% chance pfo is related to stroke ) Code Stroke CT head hyperdense R MCA bifurcation. No acute abnormality. R maxillary sinus dz. ASPECTS 10.    CTA head & neck no LVO. Mild distal SMALL VESSEL DISEASE anterior and posterior circulation. Neck ok  CT perfusion Unremarkable  MRI  2 tiny acute cortical R brain (posterior middle frontal gyrus and posterior R parietal lobe). Read as old cerebellar infarcts but  Dr. Leonie Man feels they are flow voids and not old stroke. Mild paranasal sinus dz.  2D Echo normal  TEE  PFO present EEG normal  LE dopplers no DVT  LDL 151 mg% HgbA1c 5.3 UDS not done  TSH normal    ROS:   14 system review of systems performed and negative with exception of those listed in HPI      PMH:  Past Medical History:  Diagnosis Date    Asthma    Hyperlipidemia    Migraine    Rotator cuff tear    Spondylolisthesis    L4-5    PSH:  Past Surgical History:  Procedure Laterality Date   BUBBLE STUDY  07/30/2019   Procedure: BUBBLE STUDY;  Surgeon: Donato Heinz, MD;  Location: [facility] ENDOSCOPY;  Service: Cardiovascular;;   LOOP RECORDER INSERTION N/A 07/30/2019   Procedure: LOOP RECORDER INSERTION;  Surgeon: Evans Lance, MD;  Location: [facility];  Service: Cardiovascular;  Laterality: N/A;   No prior surgery     PATENT FORAMEN OVALE(PFO) CLOSURE N/A 04/19/2020   Procedure: PATENT FORAMEN OVALE (PFO) CLOSURE;  Surgeon: Sherren Mocha, MD;  Location: [facility];  Service: Cardiovascular;  Laterality: N/A;   TEE WITHOUT  CARDIOVERSION N/A 07/30/2019   Procedure: TRANSESOPHAGEAL ECHOCARDIOGRAM (TEE);  Surgeon: Donato Heinz, MD;  Location[facility] ENDOSCOPY;  Service: Cardiovascular;  Laterality: N/A;    Social History:  Social History   Socioeconomic History   Marital status: Married    Spouse name: Not on file   Number of children: 2   Years of education: BSN   Highest education level: Not on file  Occupational History   Occupation: [employer]  Tobacco Use   Smoking status: Never   Smokeless tobacco: Never  Substance and Sexual Activity   Alcohol use: No    Alcohol/week: 0.0 standard drinks of alcohol    Comment: Rare   Drug use: No   Sexual activity: Yes    Birth control/protection: None  Other Topics Concern   Not on file  Social History Narrative   Lives at home w/ her husband and son   Right-handed   Caffeine: seldom, soda on the weekend   Social Determinants of Health   Financial Resource Strain: Not on file  Food Insecurity: Not on file  Transportation Needs: Not on file  Physical Activity: Not on file  Stress: Not on file  Social Connections: Not on file  Intimate Partner Violence: Not on file    Family History:  Family History  Problem Relation Age of Onset    Heart disease Father 42       MI   Hypertension Maternal Grandmother    Hyperlipidemia Brother     Medications:   Current Outpatient Medications on File Prior to Visit  Medication Sig Dispense Refill   aspirin EC 81 MG EC tablet Take 1 tablet (81 mg total) by mouth daily.     cholecalciferol (VITAMIN D) 1000 UNITS tablet Take 1,000 Units by mouth daily.     Coenzyme Q10 (COQ10 PO) Take 1 tablet by mouth daily.      COVID-19 mRNA bivalent vaccine, Moderna, (MODERNA COVID-19 BIVAL BOOSTER) 50 MCG/0.5ML injection Inject into the muscle. 0.5 mL 0   Omega-3 Fatty Acids (FISH OIL) 500 MG CAPS Natures made Fish oil pearls 500mg  BID 180 capsule 1   Probiotic Product (PROBIOTIC PO) Take 1 capsule by mouth daily.      rizatriptan (MAXALT) 10 MG tablet Take 10 mg by mouth as needed for migraine (headache).      rosuvastatin (CRESTOR) 20 MG tablet TAKE 1 TABLET BY MOUTH ONCE A DAY 90 tablet 1   scopolamine (TRANSDERM-SCOP) 1 MG/3DAYS Place 1 patch onto the skin daily as needed (Motion sickness).      No current facility-administered medications on file prior to visit.    Allergies:   Allergies  Allergen Reactions   Crestor [Rosuvastatin] Other (See Comments)    Muscle pain with 40 mg   Cyclobenzaprine Other (See Comments)    "loopy"/ Can take 0.5 dose   Tape Rash    adhesive     Physical Exam  There were no vitals filed for this visit.     There is no height or weight on file to calculate BMI. No results found.  General: well developed, well nourished, pleasant middle-age female, seated, in no evident distress Head: head normocephalic and atraumatic.   Neck: supple with no carotid or supraclavicular bruits Cardiovascular: regular rate and rhythm, no murmurs Musculoskeletal: no deformity Skin:  no rash/petichiae Vascular:  Normal pulses all extremities   Neurologic Exam Mental Status: Awake and fully alert.  Occasional decreased volume with occasional speech hesitancy.   Oriented to place  and time. Recent memory impaired and remote memory intact. Attention span, concentration and fund of knowledge appropriate during visit. Mood and affect appropriate.   Cranial Nerves:  Pupils equal, briskly reactive to light. Extraocular movements full without nystagmus. Visual fields full to confrontation. Hearing intact. Decreased sensory left lower face. Left nasolabial fold flatting. tongue, palate moves normally and symmetrically.  Motor: Normal bulk and tone. Normal strength in all tested extremity muscles except mild diminished fine finger movements on the left  Sensory.: slight decreased light touch, pinprick and vibratory sensation LUE and LLE compared to right side Coordination: Rapid alternating movements normal in all extremities except decreased left hand. Finger-to-nose and heel-to-shin performed accurately bilaterally. Gait and Station: Arises from chair without difficulty. Stance is normal. Gait demonstrates normal stride length with mild imbalance and unsteadiness and use of walking stick Reflexes: 1+ and symmetric. Toes downgoing.        ASSESSMENT/PLAN: Tara [name] is a 57 y.o. year old female with right parietal infarcts x2 on 07/29/2019 secondary to unknown source s/p loop recorder as well as evidence of PFO s/p closure 03/2020 with residual left hemisensory deficit, gait impairment, dizziness, vertigo impairment and anxiety/depression.  Vascular risk factors include HLD, migraines and OSA on CPAP.       R parietal strokes, cryptogenic -stable - now approved for Social Security disability -Continue to follow with Dr. Sima Matas for poststroke anxiety/depression -Loop recorder has not shown atrial fibrillation thus far -Continue aspirin and Crestor for secondary stroke prevention measures -Continue close PCP follow-up for aggressive stroke risk factor management including HLD with LDL goal<70    OSA on CPAP -Low >4 hour compliance although  optimal residual AHI over the past 30 days.  Discussed importance of increasing nightly usage to greater than 4 hours per night for optimal benefit and per insurance requirements.  -Continue current pressure setting of 6-14 with EPR level 2 -Received new CPAP device 05/2022 as her prior device stopped working correctly -Continue to follow with DME needed supplies or CPAP related concerns     Follow-up in 1 year or call earlier if needed    CC:  Vernie Shanks, MD (Inactive)    I spent 36 minutes of face-to-face and non-face-to-face time with Tara [name].  This included previsit chart review, lab review, study review, order entry, electronic health record documentation, Tara [name] education regarding prior stroke with residual deficits, secondary stroke prevention measures and aggressive stroke risk factor management, use of CPAP for OSA management and review and discussion of CPAP compliance report and answered all other questions to Tara [name]'s satisfaction  Frann Rider, AGNP-BC  [facility] [address] [place], [place] [address]  Phone [phone] Fax [phone] Note: This document was prepared with digital dictation and possible smart phrase technology. Any transcriptional errors that result from this process are unintentional.

## 2022-08-30 ENCOUNTER — Ambulatory Visit: Payer: Medicare Other | Admitting: Adult Health

## 2022-08-31 DIAGNOSIS — M25512 Pain in left shoulder: Secondary | ICD-10-CM | POA: Diagnosis not present

## 2022-08-31 DIAGNOSIS — M25511 Pain in right shoulder: Secondary | ICD-10-CM | POA: Diagnosis not present

## 2022-09-05 DIAGNOSIS — M25511 Pain in right shoulder: Secondary | ICD-10-CM | POA: Diagnosis not present

## 2022-09-05 DIAGNOSIS — M25512 Pain in left shoulder: Secondary | ICD-10-CM | POA: Diagnosis not present

## 2022-09-11 ENCOUNTER — Ambulatory Visit (INDEPENDENT_AMBULATORY_CARE_PROVIDER_SITE_OTHER): Payer: Medicare Other

## 2022-09-11 DIAGNOSIS — I63411 Cerebral infarction due to embolism of right middle cerebral artery: Secondary | ICD-10-CM | POA: Diagnosis not present

## 2022-09-11 LAB — CUP PACEART REMOTE DEVICE CHECK
Date Time Interrogation Session: 20240414230848
Implantable Pulse Generator Implant Date: 20210303

## 2022-09-12 NOTE — Progress Notes (Signed)
[facility] Summary Report / Loop Recorder 

## 2022-09-14 DIAGNOSIS — M25512 Pain in left shoulder: Secondary | ICD-10-CM | POA: Diagnosis not present

## 2022-09-14 DIAGNOSIS — M25511 Pain in right shoulder: Secondary | ICD-10-CM | POA: Diagnosis not present

## 2022-09-19 DIAGNOSIS — M25512 Pain in left shoulder: Secondary | ICD-10-CM | POA: Diagnosis not present

## 2022-09-19 DIAGNOSIS — M25511 Pain in right shoulder: Secondary | ICD-10-CM | POA: Diagnosis not present

## 2022-09-25 DIAGNOSIS — N611 Abscess of the breast and nipple: Secondary | ICD-10-CM | POA: Diagnosis not present

## 2022-09-25 DIAGNOSIS — L301 Dyshidrosis [pompholyx]: Secondary | ICD-10-CM | POA: Diagnosis not present

## 2022-09-25 DIAGNOSIS — N61 Mastitis without abscess: Secondary | ICD-10-CM | POA: Diagnosis not present

## 2022-09-26 ENCOUNTER — Ambulatory Visit: Payer: Medicare Other | Admitting: Psychology

## 2022-09-27 DIAGNOSIS — L739 Follicular disorder, unspecified: Secondary | ICD-10-CM | POA: Diagnosis not present

## 2022-09-27 DIAGNOSIS — N61 Mastitis without abscess: Secondary | ICD-10-CM | POA: Diagnosis not present

## 2022-09-27 DIAGNOSIS — I69354 Hemiplegia and hemiparesis following cerebral infarction affecting left non-dominant side: Secondary | ICD-10-CM | POA: Diagnosis not present

## 2022-09-27 DIAGNOSIS — E559 Vitamin D deficiency, unspecified: Secondary | ICD-10-CM | POA: Diagnosis not present

## 2022-09-27 DIAGNOSIS — E7800 Pure hypercholesterolemia, unspecified: Secondary | ICD-10-CM | POA: Diagnosis not present

## 2022-10-03 DIAGNOSIS — N61 Mastitis without abscess: Secondary | ICD-10-CM | POA: Diagnosis not present

## 2022-10-12 NOTE — Progress Notes (Signed)
[facility] Summary Report / Loop Recorder 

## 2022-10-16 ENCOUNTER — Ambulatory Visit (INDEPENDENT_AMBULATORY_CARE_PROVIDER_SITE_OTHER): Payer: Medicare Other

## 2022-10-16 DIAGNOSIS — I639 Cerebral infarction, unspecified: Secondary | ICD-10-CM | POA: Diagnosis not present

## 2022-10-16 LAB — CUP PACEART REMOTE DEVICE CHECK
Date Time Interrogation Session: 20240517230018
Implantable Pulse Generator Implant Date: 20210303

## 2022-10-17 ENCOUNTER — Encounter: Payer: Medicare Other | Attending: Psychology | Admitting: Psychology

## 2022-10-17 DIAGNOSIS — I69398 Other sequelae of cerebral infarction: Secondary | ICD-10-CM | POA: Diagnosis not present

## 2022-10-17 DIAGNOSIS — R202 Paresthesia of skin: Secondary | ICD-10-CM | POA: Diagnosis not present

## 2022-10-17 DIAGNOSIS — I69919 Unspecified symptoms and signs involving cognitive functions following unspecified cerebrovascular disease: Secondary | ICD-10-CM

## 2022-10-17 DIAGNOSIS — F0630 Mood disorder due to known physiological condition, unspecified: Secondary | ICD-10-CM | POA: Diagnosis present

## 2022-10-17 DIAGNOSIS — I63411 Cerebral infarction due to embolism of right middle cerebral artery: Secondary | ICD-10-CM

## 2022-10-18 ENCOUNTER — Encounter: Payer: Self-pay | Admitting: Psychology

## 2022-10-18 NOTE — Progress Notes (Signed)
Neuropsychology Visit  Patient:  Tara [name]   DOB: 09/25/1965  MR Number: [id number]  Location: [facility] FOR PAIN AND [facility] MEDICINE [facility] PHYSICAL MEDICINE & REHABILITATION [address] [place], [place] [address] [id number] [facility] [place] [address] Dept: [phone]  Date of Service: 10/17/2022  Start: 11 AM End: 12 PM Today's visit was an in person visit that was conducted in my outpatient clinic office.  The patient and myself were present for this visit.  Duration of Service: 1 Hour  Provider/Observer:     Hershal Coria PsyD  Chief Complaint:      Chief Complaint  Patient presents with   Cerebrovascular Accident   Memory Loss   Anxiety   Depression    Reason For Service:      Tara [name] is a 57 year old female referred by Ihor Austin, NP with [facility] for neuropsychological consultation.  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  The patient had a cerebrovascular accident on 07/29/2019 and is continued to have difficulties with adjustment and residual cognitive deficits and changes, changes in mood and residual left-handed paresthesias.  She has significant fear about stroke and other vascular events with personal experience needs of her father dying of a heart attack when he was 40 years old and her aunt recently dying of a stroke at 47 years old.  Treatment Interventions:  Continuing to work on cognitive behavioral and other coping strategies around adapting to residual effects of her CVA.  Participation Level:   Active  Participation Quality:  Appropriate      Behavioral Observation:  Well Groomed, Alert, and Appropriate.   Current Psychosocial Factors: T the patient reports that her husband has continued to do much better as far as how he interacts with her.  She continues to be very slow when getting ready to go somewhere including getting  dressed and keeping her thoughts organized and attention and focus.  The patient reports that the fact that she cannot engage in more than 1 specific task at a time and even then she gets easily distracted to something else has caused significant frustration for her and it was exacerbated by the way her husband reacted regularly telling her to hurry up etc.  This is improved and the patient reports that she is getting more comfortable with the way she is.  The length of time since her cerebrovascular accident would suggest that she has plateaued as far as neurological improvement.  The patient is continuing to work on coping and adjustment issues.  Content of Session:   Reviewed current symptoms and continue to work on therapeutic interventions around coping and adjustment to late effects of her CVA.  Effectiveness of Interventions: Patient was fully engaged in rapport has been easily to establish.  The patient actively participates in therapeutic interventions and working on coping resources.  Target Goals:   Improved coping and adjustment issues around residual effects of her CVA.  Goals Last Reviewed:   10/17/2022  Goals Addressed Today:    We continue to work on coping and adjustment issues particular around some of the foundational issues as well as also the way she interprets changes she is experienced since her stroke and ways to better manage the impact it is having on self-esteem as well as coping with the impact it is having on her husband and his response to her.  Impression/Diagnosis:   Tara [name] is a 57 year old female referred  by Ihor Austin, NP with [facility] for neuropsychological consultation.  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  The patient had a cerebrovascular accident on 07/29/2019 and is continued to have difficulties with adjustment and residual cognitive deficits and changes, changes  in mood and residual left-handed paresthesias.  She has significant fear about stroke and other vascular events with personal experience needs of her father dying of a heart attack when he was 24 years old and her aunt recently dying of a stroke at 13 years old.   The patient is clearly having adjustment difficulties with residual effects of her cerebrovascular accident including significant anxiety and depression which are exacerbating residual effects of her cerebrovascular accident that occurred in March of this year.  The patient has not been able to return to work.  The patient reports that her anxiety has improved considerably and some of the psychosocial variables and socioeconomic issues have improved more recently.  She is also having better interactions and more effective interactions with her husband.  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 Clinical Psychologist Neuropsychologist

## 2022-11-06 ENCOUNTER — Ambulatory Visit: Payer: Medicare Other | Admitting: Neurology

## 2022-11-10 NOTE — Progress Notes (Signed)
[facility] Summary Report / Loop Recorder 

## 2022-11-20 ENCOUNTER — Ambulatory Visit (INDEPENDENT_AMBULATORY_CARE_PROVIDER_SITE_OTHER): Payer: Medicare Other

## 2022-11-20 DIAGNOSIS — I639 Cerebral infarction, unspecified: Secondary | ICD-10-CM

## 2022-11-20 LAB — CUP PACEART REMOTE DEVICE CHECK
Date Time Interrogation Session: 20240623231021
Implantable Pulse Generator Implant Date: 20210303

## 2022-12-07 NOTE — Progress Notes (Signed)
[facility] Summary Report / Loop Recorder 

## 2022-12-25 ENCOUNTER — Ambulatory Visit (INDEPENDENT_AMBULATORY_CARE_PROVIDER_SITE_OTHER): Payer: Medicare Other

## 2022-12-25 ENCOUNTER — Encounter: Payer: Self-pay | Admitting: Neurology

## 2022-12-25 ENCOUNTER — Ambulatory Visit: Payer: Medicare Other | Admitting: Neurology

## 2022-12-25 VITALS — BP 103/70 | HR 84 | Ht 64.0 in | Wt 163.0 lb

## 2022-12-25 DIAGNOSIS — G4733 Obstructive sleep apnea (adult) (pediatric): Secondary | ICD-10-CM

## 2022-12-25 DIAGNOSIS — I639 Cerebral infarction, unspecified: Secondary | ICD-10-CM

## 2022-12-25 NOTE — Patient Instructions (Signed)
Continue nightly use of CPAP, work on using nightly for minimum of 4 hours.   Continue aspirin 81 mg daily for secondary stroke prevention. Close follow up with PCP Strict management of vascular risk factors with a goal BP less than 130/90, A1c less than 7.0, LDL less than 70 for secondary stroke prevention  Follow in 1 year

## 2022-12-25 NOTE — Progress Notes (Signed)
Patient: Tara [name] Date of Birth: 23-Feb-1966  Reason for Visit: Follow up History from: Patient Primary Neurologist: Jessica/Sethi   ASSESSMENT/PLAN: Tara [name] is a 57 y.o. year old female with right parietal infarcts x2 on 07/29/2019 secondary to unknown source s/p loop recorder as well as evidence of PFO s/p closure 03/2020 with residual left hemisensory deficit, gait impairment, dizziness, vertigo impairment and anxiety/depression.  Vascular risk factors include HLD, migraines and OSA on CPAP   1.  Right parietal strokes, cryptogenic -Has loop recorder, no A-fib has been detected -Continues follow-up with Dr. Kieth Brightly for cognitive impairment, mood disorder poststroke -On aspirin 81 mg daily for secondary stroke prevention -Continue close follow-up with primary care for strict management of vascular risk factors with a goal BP less than 130/90, A1c less than 7.0, LDL less than 70 for secondary stroke prevention  2.  OSA on CPAP -Discussed the importance of nightly usage for minimum of 4 hours, discussed putting on when getting into bed.  Would recommend minimum 70% compliance for clinical benefit and insurance requirement.  Patient is motivated to improve.  Will continue current settings, send supplies as needed from DME -Follow-up with [name] in 1 year or sooner if needed  HISTORY OF PRESENT ILLNESS: Today 12/25/22 Here for revisit.  Is established patient of [name]. For CPAP, she falls asleep without putting on, 11/18/22-12/17/22 24/30 days 80% > 4 hours 57% 17 days. 6-14 cm water. Using FFM. AHI 1.0, leak 28.8, doesn't notice any leaking, except when turning to side. Isn't sure much difference on nights of CPAP use vs not. On aspirin 81 mg daily for stroke prevention, Crestor. PCP follows labs.  On disability. Drives nearby. LR no episodes of AFIB. No health issues. Continues to have anxiety, cognitive impairment slow thought process/focus, follow with Dr.  Kieth Brightly. Slight weakness to left arm, leg, gait instability. Uses walking stick. Someone dropped her off. ESS 9.  HISTORY  Update 08/23/2021 JM: Patient returns for 85-month stroke follow-up.  Overall stable from stroke standpoint.  Reports residual occasional dizziness, cognitive impairment and anxiety - denies any worsening. Continues to ambulate with a walking stick, no recent falls.  She has finally been approved for social security disability which is a large stress reliever for her!!  She continues to follow with Dr. Kieth Brightly for ongoing mood disorder/adjustment disorder with depression and anxiety post stroke, has f/u visit in May.  Denies new stroke/TIA symptoms.  Compliant on aspirin and Crestor, denies side effects.  Blood pressure today 111/76. Loop recorder has not shown atrial fibrillation thus far.  Reports nightly use of CPAP. Reports improved tolerance since pressure setting adjustments at prior visit. Repeat download report showed improved usage with 27/30 usage days and 21 days greater than 4 hours for 70% compliance with residual AHI 1.0. No further concerns at this time.   REVIEW OF SYSTEMS: Out of a complete 14 system review of symptoms, the patient complains only of the following symptoms, and all other reviewed systems are negative.  See HPI  ALLERGIES: Allergies  Allergen Reactions   Crestor [Rosuvastatin] Other (See Comments)    Muscle pain with 40 mg   Cyclobenzaprine Other (See Comments)    "loopy"/ Can take 0.5 dose   Tape Rash    adhesive    HOME MEDICATIONS: Outpatient Medications Prior to Visit  Medication Sig Dispense Refill   aspirin EC 81 MG EC tablet Take 1 tablet (81 mg total) by mouth daily.     cholecalciferol (VITAMIN  D) 1000 UNITS tablet Take 1,000 Units by mouth daily.     Coenzyme Q10 (COQ10 PO) Take 1 tablet by mouth daily.      COVID-19 mRNA bivalent vaccine, Moderna, (MODERNA COVID-19 BIVAL BOOSTER) 50 MCG/0.5ML injection Inject into the  muscle. 0.5 mL 0   Omega-3 Fatty Acids (FISH OIL) 500 MG CAPS Natures made Fish oil pearls 500mg  BID 180 capsule 1   Probiotic Product (PROBIOTIC PO) Take 1 capsule by mouth daily.      rizatriptan (MAXALT) 10 MG tablet Take 10 mg by mouth as needed for migraine (headache).      scopolamine (TRANSDERM-SCOP) 1 MG/3DAYS Place 1 patch onto the skin daily as needed (Motion sickness).      rosuvastatin (CRESTOR) 20 MG tablet TAKE 1 TABLET BY MOUTH ONCE A DAY 90 tablet 1   No facility-administered medications prior to visit.    PAST MEDICAL HISTORY: Past Medical History:  Diagnosis Date   Asthma    Hyperlipidemia    Migraine    Rotator cuff tear    Spondylolisthesis    L4-5    PAST SURGICAL HISTORY: Past Surgical History:  Procedure Laterality Date   BUBBLE STUDY  07/30/2019   Procedure: BUBBLE STUDY;  Surgeon: [name], MD;  Location: [facility] ENDOSCOPY;  Service: Cardiovascular;;   LOOP RECORDER INSERTION N/A 07/30/2019   Procedure: LOOP RECORDER INSERTION;  Surgeon: [name], MD;  Location: [facility] INVASIVE CV LAB;  Service: Cardiovascular;  Laterality: N/A;   No prior surgery     PATENT FORAMEN OVALE(PFO) CLOSURE N/A 04/19/2020   Procedure: PATENT FORAMEN OVALE (PFO) CLOSURE;  Surgeon: [name], MD;  Location: [facility] INVASIVE CV LAB;  Service: Cardiovascular;  Laterality: N/A;   TEE WITHOUT CARDIOVERSION N/A 07/30/2019   Procedure: TRANSESOPHAGEAL ECHOCARDIOGRAM (TEE);  Surgeon: [name], MD;  Location: [facility] ENDOSCOPY;  Service: Cardiovascular;  Laterality: N/A;    FAMILY HISTORY: Family History  Problem Relation Age of Onset   Heart disease Father 53       MI   Hypertension Maternal Grandmother    Hyperlipidemia Brother     SOCIAL HISTORY: Social History   Socioeconomic History   Marital status: Married    Spouse name: Not on file   Number of children: 2   Years of education: BSN   Highest education level: Not on file  Occupational History    Occupation: [employer]  Tobacco Use   Smoking status: Never   Smokeless tobacco: Never  Substance and Sexual Activity   Alcohol use: No    Alcohol/week: 0.0 standard drinks of alcohol    Comment: Rare   Drug use: No   Sexual activity: Yes    Birth control/protection: None  Other Topics Concern   Not on file  Social History Narrative   Lives at home w/ her husband and son   Right-handed   Caffeine: seldom, soda on the weekend   Social Determinants of Health   Financial Resource Strain: Not on file  Food Insecurity: Not on file  Transportation Needs: Not on file  Physical Activity: Not on file  Stress: Not on file  Social Connections: Not on file  Intimate Partner Violence: Not on file   PHYSICAL EXAM  Vitals:   12/25/22 1422  BP: 103/70  Pulse: 84  Weight: 163 lb (73.9 kg)  Height: 5\' 4"  (1.626 m)   Body mass index is 27.98 kg/m.  Generalized: Well developed, in no acute distress  Neurological examination  Mentation:  Alert oriented to time, place, history taking. Follows all commands speech and language fluent.  Speech is slow, slow processing. Cranial nerve II-XII: Pupils were equal round reactive to light. Extraocular movements were full, visual field were full on confrontational test. Facial sensation and strength were normal. Head turning and shoulder shrug were normal and symmetric. Motor: Slight weakness left arm, left leg Sensory: Sensory testing is intact to soft touch on all 4 extremities. No evidence of extinction is noted.  Coordination: Cerebellar testing reveals good finger-nose-finger and heel-to-shin bilaterally.  Gait and station: Gait is slightly unsteady, limp on the left, has a walking stick Reflexes: Deep tendon reflexes are symmetric and normal bilaterally.   DIAGNOSTIC DATA (LABS, IMAGING, TESTING) - I reviewed patient records, labs, notes, testing and imaging myself where available.  Lab Results  Component Value Date   WBC 5.6 04/16/2020    HGB 12.6 04/16/2020   HCT 37.6 04/16/2020   MCV 94 04/16/2020   PLT 390 04/16/2020      Component Value Date/Time   NA 143 04/16/2020 0918   K 4.7 04/16/2020 0918   CL 105 04/16/2020 0918   CO2 27 04/16/2020 0918   GLUCOSE 98 04/16/2020 0918   GLUCOSE 114 (H) 07/30/2019 0639   BUN 10 04/16/2020 0918   CREATININE 0.85 04/16/2020 0918   CREATININE 0.70 06/22/2014 1202   CALCIUM 9.4 04/16/2020 0918   PROT 6.9 07/29/2019 0406   ALBUMIN 4.0 07/29/2019 0406   AST 21 07/29/2019 0406   ALT 27 07/29/2019 0406   ALKPHOS 52 07/29/2019 0406   BILITOT 0.4 07/29/2019 0406   GFRNONAA 79 04/16/2020 0918   GFRNONAA >89 06/22/2014 1202   GFRAA 91 04/16/2020 0918   GFRAA >89 06/22/2014 1202   Lab Results  Component Value Date   CHOL 241 (H) 07/29/2019   HDL 51 07/29/2019   LDLCALC 151 (H) 07/29/2019   TRIG 193 (H) 07/29/2019   CHOLHDL 4.7 07/29/2019   Lab Results  Component Value Date   HGBA1C 5.3 07/29/2019   No results found for: "VITAMINB12" Lab Results  Component Value Date   TSH 1.831 07/30/2019    Margie Ege, AGNP-C, DNP 12/25/2022, 2:30 PM [facility] [address], [address] [place], [place] [address] [phone]

## 2022-12-27 ENCOUNTER — Encounter: Payer: Medicare Other | Attending: Psychology | Admitting: Psychology

## 2022-12-27 DIAGNOSIS — I69398 Other sequelae of cerebral infarction: Secondary | ICD-10-CM | POA: Diagnosis not present

## 2022-12-27 DIAGNOSIS — F0630 Mood disorder due to known physiological condition, unspecified: Secondary | ICD-10-CM | POA: Insufficient documentation

## 2022-12-27 DIAGNOSIS — R202 Paresthesia of skin: Secondary | ICD-10-CM | POA: Insufficient documentation

## 2022-12-27 DIAGNOSIS — I69919 Unspecified symptoms and signs involving cognitive functions following unspecified cerebrovascular disease: Secondary | ICD-10-CM | POA: Diagnosis not present

## 2022-12-27 DIAGNOSIS — I63411 Cerebral infarction due to embolism of right middle cerebral artery: Secondary | ICD-10-CM | POA: Diagnosis not present

## 2023-01-09 NOTE — Progress Notes (Signed)
[facility] Summary Report / Loop Recorder 

## 2023-01-25 LAB — CUP PACEART REMOTE DEVICE CHECK
Date Time Interrogation Session: 20240828230145
Implantable Pulse Generator Implant Date: 20210303

## 2023-01-30 ENCOUNTER — Ambulatory Visit (INDEPENDENT_AMBULATORY_CARE_PROVIDER_SITE_OTHER): Payer: 59

## 2023-01-30 DIAGNOSIS — I639 Cerebral infarction, unspecified: Secondary | ICD-10-CM

## 2023-02-06 NOTE — Progress Notes (Signed)
[facility] Summary Report / Loop Recorder 

## 2023-02-20 ENCOUNTER — Encounter: Payer: Self-pay | Admitting: Psychology

## 2023-02-20 NOTE — Progress Notes (Signed)
Neuropsychology Visit  Patient:  Tara [name]   DOB: 01-20-1966  MR Number: [id number]  Location: [facility] FOR PAIN AND REHABILITATIVE MEDICINE [place] PHYSICAL MEDICINE & REHABILITATION [address] [place], [address] [place] [address] Dept: [phone]  Date of Service: 12/27/2022  Start: 11 AM End: 12 PM Today's visit was an in person visit that was conducted in my outpatient clinic office.  The patient and myself were present for this visit.  Duration of Service: 1 Hour  Provider/Observer:     Hershal Coria PsyD  Chief Complaint:      Chief Complaint  Patient presents with   Memory Loss   Cerebrovascular Accident   Anxiety   Depression    Reason For Service:      Tara [name] is a 57 year old female referred by Ihor Austin, NP with [facility] for neuropsychological consultation.  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  The patient had a cerebrovascular accident on 07/29/2019 and is continued to have difficulties with adjustment and residual cognitive deficits and changes, changes in mood and residual left-handed paresthesias.  She has significant fear about stroke and other vascular events with personal experience needs of her father dying of a heart attack when he was 30 years old and her aunt recently dying of a stroke at 3 years old.  Treatment Interventions:  Continuing to work on cognitive behavioral and other coping strategies around adapting to residual effects of her CVA.  Participation Level:   Active  Participation Quality:  Appropriate      Behavioral Observation:  Well Groomed, Alert, and Appropriate.   Current Psychosocial Factors: T the patient reports that her husband has continued to do much better as far as how he interacts with her.  She continues to be very slow when getting ready to go somewhere including getting dressed and keeping  her thoughts organized and attention and focus.  The patient reports that the fact that she cannot engage in more than 1 specific task at a time and even then she gets easily distracted to something else has caused significant frustration for her and it was exacerbated by the way her husband reacted regularly telling her to hurry up etc.  This is improved and the patient reports that she is getting more comfortable with the way she is.  The length of time since her cerebrovascular accident would suggest that she has plateaued as far as neurological improvement.  The patient is continuing to work on coping and adjustment issues.  Content of Session:   Reviewed current symptoms and continue to work on therapeutic interventions around coping and adjustment to late effects of her CVA.  Effectiveness of Interventions: Patient was fully engaged in rapport has been easily to establish.  The patient actively participates in therapeutic interventions and working on coping resources.  Target Goals:   Improved coping and adjustment issues around residual effects of her CVA.  Goals Last Reviewed:   12/27/2022  Goals Addressed Today:    We continue to work on coping and adjustment issues particular around some of the foundational issues as well as also the way she interprets changes she is experienced since her stroke and ways to better manage the impact it is having on self-esteem as well as coping with the impact it is having on her husband and his response to her.  Impression/Diagnosis:   Tara [name] is a 57 year old female referred by  Ihor Austin, NP with [facility] for neuropsychological consultation.  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  The patient had a cerebrovascular accident on 07/29/2019 and is continued to have difficulties with adjustment and residual cognitive deficits and changes, changes in mood and  residual left-handed paresthesias.  She has significant fear about stroke and other vascular events with personal experience needs of her father dying of a heart attack when he was 34 years old and her aunt recently dying of a stroke at 37 years old.   The patient is clearly having adjustment difficulties with residual effects of her cerebrovascular accident including significant anxiety and depression which are exacerbating residual effects of her cerebrovascular accident that occurred in March of this year.  The patient has not been able to return to work.  The patient reports that her anxiety has improved considerably and some of the psychosocial variables and socioeconomic issues have improved more recently.  She is also having better interactions and more effective interactions with her husband.  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 Clinical Psychologist Neuropsychologist

## 2023-02-27 LAB — CUP PACEART REMOTE DEVICE CHECK
Date Time Interrogation Session: 20241001052058
Implantable Pulse Generator Implant Date: 20210303

## 2023-03-03 DIAGNOSIS — R1013 Epigastric pain: Secondary | ICD-10-CM | POA: Diagnosis not present

## 2023-03-03 DIAGNOSIS — R0789 Other chest pain: Secondary | ICD-10-CM | POA: Diagnosis not present

## 2023-03-05 ENCOUNTER — Ambulatory Visit (INDEPENDENT_AMBULATORY_CARE_PROVIDER_SITE_OTHER): Payer: 59

## 2023-03-05 DIAGNOSIS — I639 Cerebral infarction, unspecified: Secondary | ICD-10-CM | POA: Diagnosis not present

## 2023-03-09 ENCOUNTER — Emergency Department (HOSPITAL_BASED_OUTPATIENT_CLINIC_OR_DEPARTMENT_OTHER): Payer: Medicare Other

## 2023-03-09 ENCOUNTER — Emergency Department (HOSPITAL_BASED_OUTPATIENT_CLINIC_OR_DEPARTMENT_OTHER)
Admission: EM | Admit: 2023-03-09 | Discharge: 2023-03-10 | Disposition: A | Discharge status: Home / Self Care | Payer: Medicare Other | Attending: Emergency Medicine | Admitting: Emergency Medicine

## 2023-03-09 ENCOUNTER — Encounter (HOSPITAL_BASED_OUTPATIENT_CLINIC_OR_DEPARTMENT_OTHER): Payer: Self-pay | Admitting: Emergency Medicine

## 2023-03-09 ENCOUNTER — Other Ambulatory Visit: Payer: Self-pay

## 2023-03-09 DIAGNOSIS — N281 Cyst of kidney, acquired: Secondary | ICD-10-CM | POA: Diagnosis not present

## 2023-03-09 DIAGNOSIS — R001 Bradycardia, unspecified: Secondary | ICD-10-CM | POA: Diagnosis not present

## 2023-03-09 DIAGNOSIS — Z7982 Long term (current) use of aspirin: Secondary | ICD-10-CM | POA: Diagnosis not present

## 2023-03-09 DIAGNOSIS — R1013 Epigastric pain: Secondary | ICD-10-CM | POA: Diagnosis not present

## 2023-03-09 DIAGNOSIS — R109 Unspecified abdominal pain: Secondary | ICD-10-CM | POA: Diagnosis present

## 2023-03-09 LAB — COMPREHENSIVE METABOLIC PANEL
ALT: 63 U/L — ABNORMAL HIGH (ref 0–44)
AST: 102 U/L — ABNORMAL HIGH (ref 15–41)
Albumin: 4.5 g/dL (ref 3.5–5.0)
Alkaline Phosphatase: 57 U/L (ref 38–126)
Anion gap: 12 (ref 5–15)
BUN: 15 mg/dL (ref 6–20)
CO2: 27 mmol/L (ref 22–32)
Calcium: 9.8 mg/dL (ref 8.9–10.3)
Chloride: 100 mmol/L (ref 98–111)
Creatinine, Ser: 0.87 mg/dL (ref 0.44–1.00)
GFR, Estimated: >60 mL/min (ref >60)
Glucose, Bld: 127 mg/dL — ABNORMAL HIGH (ref 70–99)
Potassium: 3.7 mmol/L (ref 3.5–5.1)
Sodium: 139 mmol/L (ref 135–145)
Total Bilirubin: 1.4 mg/dL — ABNORMAL HIGH (ref 0.3–1.2)
Total Protein: 7.7 g/dL (ref 6.5–8.1)

## 2023-03-09 LAB — LIPASE, BLOOD: Lipase: 44 U/L (ref 11–51)

## 2023-03-09 LAB — TROPONIN I (HIGH SENSITIVITY): Troponin I (High Sensitivity): 3 ng/L (ref <18)

## 2023-03-09 LAB — CBC
HCT: 35.9 % — ABNORMAL LOW (ref 36.0–46.0)
Hemoglobin: 12.1 g/dL (ref 12.0–15.0)
MCH: 31.3 pg (ref 26.0–34.0)
MCHC: 33.7 g/dL (ref 30.0–36.0)
MCV: 92.8 fL (ref 80.0–100.0)
Platelets: 301 10*3/uL (ref 150–400)
RBC: 3.87 MIL/uL (ref 3.87–5.11)
RDW: 11.8 % (ref 11.5–15.5)
WBC: 10.2 10*3/uL (ref 4.0–10.5)
nRBC: 0 % (ref 0.0–0.2)

## 2023-03-09 LAB — URINALYSIS, ROUTINE W REFLEX MICROSCOPIC
Glucose, UA: NEGATIVE mg/dL
Hgb urine dipstick: NEGATIVE
Ketones, ur: NEGATIVE mg/dL
Leukocytes,Ua: NEGATIVE
Nitrite: NEGATIVE
Protein, ur: 30 mg/dL — AB
Specific Gravity, Urine: 1.02 (ref 1.005–1.030)
pH: 8.5 — ABNORMAL HIGH (ref 5.0–8.0)

## 2023-03-09 LAB — URINALYSIS, MICROSCOPIC (REFLEX)

## 2023-03-09 MED ORDER — ALUM & MAG HYDROXIDE-SIMETH 200-200-20 MG/5ML PO SUSP
30.0000 mL | Freq: Once | ORAL | Status: AC
  Administered 2023-03-09: 30 mL via ORAL
  Filled 2023-03-09: qty 30

## 2023-03-09 MED ORDER — LIDOCAINE VISCOUS HCL 2 % MT SOLN
15.0000 mL | Freq: Once | OROMUCOSAL | Status: AC
  Administered 2023-03-09: 15 mL via ORAL
  Filled 2023-03-09: qty 15

## 2023-03-09 MED ORDER — IOHEXOL 300 MG/ML  SOLN
100.0000 mL | Freq: Once | INTRAMUSCULAR | Status: AC | PRN
  Administered 2023-03-09: 100 mL via INTRAVENOUS

## 2023-03-09 MED ORDER — HYDROMORPHONE HCL 1 MG/ML IJ SOLN
0.5000 mg | Freq: Once | INTRAMUSCULAR | Status: DC
  Filled 2023-03-09: qty 1

## 2023-03-09 MED ORDER — FAMOTIDINE IN NACL 20-0.9 MG/50ML-% IV SOLN
20.0000 mg | Freq: Once | INTRAVENOUS | Status: AC
  Administered 2023-03-09: 20 mg via INTRAVENOUS
  Filled 2023-03-09: qty 50

## 2023-03-09 NOTE — ED Notes (Signed)
Patient transported to CT 

## 2023-03-09 NOTE — ED Notes (Signed)
Pt was given a hot pack for her belly.

## 2023-03-09 NOTE — ED Triage Notes (Signed)
Pt started having epigastric ABD pain/pressure and heartburn on Sat, states hs eis passing gas and burping, denies n/v/d, constipation; took Tums & Pepcid with no relief

## 2023-03-09 NOTE — ED Provider Notes (Signed)
[place] [facility] Provider Note   CSN: [id number] Arrival date & time: 03/09/23  2115     History  Chief Complaint  Patient presents with   Abdominal Pain    Tara [name] is a 57 y.o. female.  57 year old female with a history of stroke with residual left hemisensory deficit, gait impairment, anxiety, and PFO closure in 2021 who presents emergency department with abdominal pain.  Has had several episodes of nocturnal abdominal pain.  Says that a month ago had an episode and then last Saturday had an episode.  Went to walk-in clinic and they did lab work, chest x-ray, and EKG that were normal.  They referred her to the emergency department for an does not appear that she actually went to the emergency department.  Today several hours prior to arrival started having 10/10 pain in her epigastrium.  Says that it radiates up to her chest.  Says that she also feels the need to burp or pass gas with it.  No nausea or vomiting or diaphoresis.  No fevers.  Takes a baby aspirin but no heavy NSAID use.  No alcohol use.  Tried Tums for it but did not improve her symptoms.       Home Medications Prior to Admission medications   Medication Sig Start Date End Date Taking? Authorizing Provider  pantoprazole (PROTONIX) 40 MG tablet Take 1 tablet (40 mg total) by mouth daily. 03/10/23  Yes Pollyann Savoy, MD  aspirin EC 81 MG EC tablet Take 1 tablet (81 mg total) by mouth daily. 07/31/19   Lurene Shadow, MD  cholecalciferol (VITAMIN D) 1000 UNITS tablet Take 1,000 Units by mouth daily.    [provider]  Coenzyme Q10 (COQ10 PO) Take 1 tablet by mouth daily.     [provider]  COVID-19 mRNA bivalent vaccine, Moderna, (MODERNA COVID-19 BIVAL BOOSTER) 50 MCG/0.5ML injection Inject into the muscle. 03/18/21   Judyann Munson, MD  Omega-3 Fatty Acids (FISH OIL) 500 MG CAPS Natures made Fish oil pearls 500mg  BID    Schoenhoff, Harrington Challenger,  MD  Probiotic Product (PROBIOTIC PO) Take 1 capsule by mouth daily.     [provider]  rizatriptan (MAXALT) 10 MG tablet Take 10 mg by mouth as needed for migraine (headache).     [provider]  rosuvastatin (CRESTOR) 20 MG tablet TAKE 1 TABLET BY MOUTH ONCE A DAY 06/29/20 07/12/21  [name], MD  scopolamine (TRANSDERM-SCOP) 1 MG/3DAYS Place 1 patch onto the skin daily as needed (Motion sickness).     [provider]      Allergies    Crestor [rosuvastatin], Cyclobenzaprine, and Tape    Review of Systems   Review of Systems  Physical Exam Updated Vital Signs BP 111/71 (BP Location: Left Arm)   Pulse 69   Temp 98.3 F (36.8 C) (Oral)   Resp 14   Ht 5\' 4"  (1.626 m)   Wt 73 kg   LMP 11/24/2013   SpO2 98%   BMI 27.64 kg/m  Physical Exam Vitals and nursing note reviewed.  Constitutional:      General: She is not in acute distress.    Appearance: She is well-developed.  HENT:     Head: Normocephalic and atraumatic.     Right Ear: External ear normal.     Left Ear: External ear normal.     Nose: Nose normal.  Eyes:     Extraocular Movements: Extraocular movements  intact.     Conjunctiva/sclera: Conjunctivae normal.     Pupils: Pupils are equal, round, and reactive to light.  Cardiovascular:     Rate and Rhythm: Normal rate and regular rhythm.     Heart sounds: No murmur heard. Pulmonary:     Effort: Pulmonary effort is normal. No respiratory distress.  Abdominal:     General: Abdomen is flat. There is no distension.     Palpations: Abdomen is soft. There is no mass.     Tenderness: There is abdominal tenderness (Epigastrium). There is no guarding.  Musculoskeletal:     Cervical back: Normal range of motion and neck supple.  Skin:    General: Skin is warm and dry.  Neurological:     Mental Status: She is alert and oriented to person, place, and time. Mental status is at baseline.  Psychiatric:        Mood and Affect: Mood normal.      ED Results / Procedures / Treatments   Labs (all labs ordered are listed, but only abnormal results are displayed) Labs Reviewed  COMPREHENSIVE METABOLIC PANEL - Abnormal; Notable for the following components:      Result Value   Glucose, Bld 127 (*)    AST 102 (*)    ALT 63 (*)    Total Bilirubin 1.4 (*)    All other components within normal limits  CBC - Abnormal; Notable for the following components:   HCT 35.9 (*)    All other components within normal limits  URINALYSIS, ROUTINE W REFLEX MICROSCOPIC - Abnormal; Notable for the following components:   pH 8.5 (*)    Bilirubin Urine SMALL (*)    Protein, ur 30 (*)    All other components within normal limits  URINALYSIS, MICROSCOPIC (REFLEX) - Abnormal; Notable for the following components:   Bacteria, UA FEW (*)    All other components within normal limits  LIPASE, BLOOD  TROPONIN I (HIGH SENSITIVITY)    EKG EKG Interpretation Date/Time:  Friday March 09 2023 21:27:02 EDT Ventricular Rate:  58 PR Interval:  118 QRS Duration:  84 QT Interval:  434 QTC Calculation: 426 R Axis:   52  Text Interpretation: Sinus bradycardia Otherwise normal ECG Confirmed by Vonita Moss [phone]) on 03/09/2023 9:41:08 PM  Radiology CT ABDOMEN PELVIS W CONTRAST  Result Date: 03/10/2023 CLINICAL DATA:  nocturnal epigastric pain, concern for mass vs pancreatitis vs severe gastritis EXAM: CT ABDOMEN AND PELVIS WITH CONTRAST TECHNIQUE: Multidetector CT imaging of the abdomen and pelvis was performed using the standard protocol following bolus administration of intravenous contrast. RADIATION DOSE REDUCTION: This exam was performed according to the departmental dose-optimization program which includes automated exposure control, adjustment of the mA and/or kV according to patient size and/or use of iterative reconstruction technique. CONTRAST:  OMNIPAQUE IOHEXOL 300 MG/ML  SOLN COMPARISON:  02/10/2005 FINDINGS: Lower chest: No acute  abnormality Hepatobiliary: No suspicious focal hepatic abnormality. Gallbladder unremarkable. No biliary ductal dilatation. Pancreas: No focal abnormality or ductal dilatation. No surrounding inflammation to suggest pancreatitis. Spleen: No focal abnormality.  Normal size. Adrenals/Urinary Tract: Left midpole renal cyst measuring up to 1.1 cm. Left lower pole cyst measures 1.5 cm. No follow-up imaging recommended. No stones or hydronephrosis. Adrenal glands and urinary bladder unremarkable. Normal appendix. Stomach, large and small bowel grossly unremarkable. Stomach/Bowel: Normal appendix. Stomach, large and small bowel grossly unremarkable. Vascular/Lymphatic: No evidence of aneurysm or adenopathy. Reproductive: Uterus and adnexa unremarkable.  No mass. Other: No free fluid  or free air. Musculoskeletal: No acute bony abnormality. IMPRESSION: No acute findings in the abdomen or pelvis. Electronically Signed   By: Charlett Nose M.D.   On: 03/10/2023 01:03    Procedures Procedures    Medications Ordered in ED Medications  famotidine (PEPCID) IVPB 20 mg premix (0 mg Intravenous Stopped 03/09/23 2328)  alum & mag hydroxide-simeth (MAALOX/MYLANTA) 200-200-20 MG/5ML suspension 30 mL (30 mLs Oral Given 03/09/23 2214)    And  lidocaine (XYLOCAINE) 2 % viscous mouth solution 15 mL (15 mLs Oral Given 03/09/23 2214)  iohexol (OMNIPAQUE) 300 MG/ML solution 100 mL (100 mLs Intravenous Contrast Given 03/09/23 2253)    ED Course/ Medical Decision Making/ A&P Clinical Course as of 03/10/23 1051  Sat Mar 10, 2023  0116 I personally viewed the images from radiology studies and agree with radiologist interpretation:  CT is neg for acute process. She reports improved symptoms with GI cocktail. Did not need pain medications. Symptoms most consistent with GI etiology. Recommend she follow up with GI. Will Rx protonix in the meantime. RTED for any other concerns.  [CS]    Clinical Course User Index [CS] Pollyann Savoy, MD                                 Medical Decision Making Amount and/or Complexity of Data Reviewed Labs: ordered. Radiology: ordered.  Risk OTC drugs. Prescription drug management.   Tara [name] is a 57 y.o. female with comorbidities that complicate the patient evaluation including stroke with residual left hemisensory deficit, gait impairment, anxiety, and PFO closure in 2021 who presents emergency department with abdominal pain.    Initial Ddx:  Peptic ulcer disease, pancreatitis, mass, cholecystitis, mesenteric ischemia, MI  MDM/Course:  Patient presents emergency department with epigastric abdominal pain that has been episodic.  On exam is very uncomfortable appearing with some tenderness to palpation in her epigastrium.  Initially was concerned about a possible ulcer but patient was having quite severe pain so we will obtain a CT scan at this time.  Upon re-evaluation pain improved somewhat with GI cocktail.  Does have risk factors for MI so EKG and troponin were obtained.  No signs of acute ischemia.  Signed out to the oncoming physician awaiting CT scan results and repeat evaluation.  This patient presents to the ED for concern of complaints listed in HPI, this involves an extensive number of treatment options, and is a complaint that carries with it a high risk of complications and morbidity. Disposition including potential need for admission considered.   Dispo: Pending remainder of workup  Additional history obtained from spouse Records reviewed Outpatient Clinic Notes I personally reviewed and interpreted cardiac monitoring: normal sinus rhythm  I personally reviewed and interpreted the pt's EKG: see above for interpretation  I have reviewed the patients home medications and made adjustments as needed  Portions of this note were generated with Dragon dictation software. Dictation errors may occur despite best attempts at proofreading.            Final Clinical Impression(s) / ED Diagnoses Final diagnoses:  Epigastric pain    Rx / DC Orders ED Discharge Orders          Ordered    pantoprazole (PROTONIX) 40 MG tablet  Daily        03/10/23 0117              Rondel Baton, MD  03/10/23 1051  

## 2023-03-10 MED ORDER — PANTOPRAZOLE SODIUM 40 MG PO TBEC: 40.0000 mg | DELAYED_RELEASE_TABLET | Freq: Every day | ORAL | 0 refills | Status: AC

## 2023-03-10 NOTE — ED Provider Notes (Signed)
Care of the patient assumed at shift change. Here for intermittent epigastric pain. Labs with mildly elevated LFTs, but otherwise reassuring. Awaiting CT. Physical Exam  BP 112/73   Pulse 61   Temp 97.9 F (36.6 C)   Resp 17   Ht 5\' 4"  (1.626 m)   Wt 73 kg   LMP 11/24/2013   SpO2 96%   BMI 27.64 kg/m   Physical Exam  Procedures  Procedures  ED Course / MDM   Clinical Course as of 03/10/23 0117  Sat Mar 10, 2023  0116 I personally viewed the images from radiology studies and agree with radiologist interpretation:  CT is neg for acute process. She reports improved symptoms with GI cocktail. Did not need pain medications. Symptoms most consistent with GI etiology. Recommend she follow up with GI. Will Rx protonix in the meantime. RTED for any other concerns.  [CS]    Clinical Course User Index [CS] Pollyann Savoy, MD   Medical Decision Making Amount and/or Complexity of Data Reviewed Labs: ordered. Radiology: ordered.  Risk OTC drugs. Prescription drug management.          Pollyann Savoy, MD 03/10/23 [phone]

## 2023-03-15 ENCOUNTER — Other Ambulatory Visit: Payer: Self-pay | Admitting: Family Medicine

## 2023-03-15 DIAGNOSIS — Z1231 Encounter for screening mammogram for malignant neoplasm of breast: Secondary | ICD-10-CM

## 2023-03-16 NOTE — Progress Notes (Signed)
[facility] Summary Report / Loop Recorder 

## 2023-04-02 LAB — CUP PACEART REMOTE DEVICE CHECK
Date Time Interrogation Session: 20241102230130
Implantable Pulse Generator Implant Date: 20210303

## 2023-04-04 DIAGNOSIS — E559 Vitamin D deficiency, unspecified: Secondary | ICD-10-CM | POA: Diagnosis not present

## 2023-04-04 DIAGNOSIS — E7800 Pure hypercholesterolemia, unspecified: Secondary | ICD-10-CM | POA: Diagnosis not present

## 2023-04-05 ENCOUNTER — Encounter: Payer: Medicare Other | Admitting: Psychology

## 2023-04-09 ENCOUNTER — Ambulatory Visit (INDEPENDENT_AMBULATORY_CARE_PROVIDER_SITE_OTHER): Payer: 59

## 2023-04-09 DIAGNOSIS — I639 Cerebral infarction, unspecified: Secondary | ICD-10-CM

## 2023-04-11 ENCOUNTER — Ambulatory Visit
Admission: RE | Admit: 2023-04-11 | Discharge: 2023-04-11 | Disposition: A | Discharge status: Home / Self Care | Payer: Medicare Other | Source: Ambulatory Visit | Attending: Family Medicine

## 2023-04-11 DIAGNOSIS — Z0000 Encounter for general adult medical examination without abnormal findings: Secondary | ICD-10-CM | POA: Diagnosis not present

## 2023-04-11 DIAGNOSIS — L0291 Cutaneous abscess, unspecified: Secondary | ICD-10-CM | POA: Diagnosis not present

## 2023-04-11 DIAGNOSIS — Z1231 Encounter for screening mammogram for malignant neoplasm of breast: Secondary | ICD-10-CM

## 2023-04-11 DIAGNOSIS — Z23 Encounter for immunization: Secondary | ICD-10-CM | POA: Diagnosis not present

## 2023-04-11 DIAGNOSIS — I69359 Hemiplegia and hemiparesis following cerebral infarction affecting unspecified side: Secondary | ICD-10-CM | POA: Diagnosis not present

## 2023-04-11 DIAGNOSIS — L03311 Cellulitis of abdominal wall: Secondary | ICD-10-CM | POA: Diagnosis not present

## 2023-04-11 DIAGNOSIS — K219 Gastro-esophageal reflux disease without esophagitis: Secondary | ICD-10-CM | POA: Diagnosis not present

## 2023-04-12 DIAGNOSIS — Z0000 Encounter for general adult medical examination without abnormal findings: Secondary | ICD-10-CM | POA: Diagnosis not present

## 2023-05-02 NOTE — Progress Notes (Signed)
[facility] Summary Report / Loop Recorder 

## 2023-05-03 ENCOUNTER — Ambulatory Visit (INDEPENDENT_AMBULATORY_CARE_PROVIDER_SITE_OTHER): Payer: Medicare Other

## 2023-05-03 DIAGNOSIS — I639 Cerebral infarction, unspecified: Secondary | ICD-10-CM

## 2023-05-04 LAB — CUP PACEART REMOTE DEVICE CHECK
Date Time Interrogation Session: 20241205230413
Implantable Pulse Generator Implant Date: 20210303

## 2023-05-11 DIAGNOSIS — J018 Other acute sinusitis: Secondary | ICD-10-CM | POA: Diagnosis not present

## 2023-05-14 ENCOUNTER — Ambulatory Visit: Payer: 59

## 2023-06-07 ENCOUNTER — Ambulatory Visit (INDEPENDENT_AMBULATORY_CARE_PROVIDER_SITE_OTHER): Payer: 59

## 2023-06-07 DIAGNOSIS — I639 Cerebral infarction, unspecified: Secondary | ICD-10-CM | POA: Diagnosis not present

## 2023-06-08 LAB — CUP PACEART REMOTE DEVICE CHECK
Date Time Interrogation Session: 20250108230220
Implantable Pulse Generator Implant Date: 20210303

## 2023-07-12 ENCOUNTER — Ambulatory Visit (INDEPENDENT_AMBULATORY_CARE_PROVIDER_SITE_OTHER): Payer: 59

## 2023-07-12 DIAGNOSIS — I639 Cerebral infarction, unspecified: Secondary | ICD-10-CM

## 2023-07-13 LAB — CUP PACEART REMOTE DEVICE CHECK
Date Time Interrogation Session: 20250212230045
Implantable Pulse Generator Implant Date: 20210303

## 2023-07-15 ENCOUNTER — Encounter: Payer: Self-pay | Admitting: Internal Medicine

## 2023-07-17 NOTE — Addendum Note (Signed)
Addended by: Elease Etienne A on: 07/17/2023 12:48 PM   Modules accepted: Orders

## 2023-07-17 NOTE — Progress Notes (Signed)
 [facility] Summary Report / Loop Recorder

## 2023-08-15 NOTE — Addendum Note (Signed)
 Addended by: Elease Etienne A on: 08/15/2023 09:18 AM   Modules accepted: Orders

## 2023-08-15 NOTE — Progress Notes (Signed)
 [facility] Summary Report / Loop Recorder

## 2023-08-16 ENCOUNTER — Ambulatory Visit (INDEPENDENT_AMBULATORY_CARE_PROVIDER_SITE_OTHER): Payer: 59

## 2023-08-16 DIAGNOSIS — I639 Cerebral infarction, unspecified: Secondary | ICD-10-CM | POA: Diagnosis not present

## 2023-08-16 LAB — CUP PACEART REMOTE DEVICE CHECK
Date Time Interrogation Session: 20250319230451
Implantable Pulse Generator Implant Date: 20210303

## 2023-08-20 ENCOUNTER — Encounter: Payer: Self-pay | Admitting: Internal Medicine

## 2023-09-20 ENCOUNTER — Ambulatory Visit (INDEPENDENT_AMBULATORY_CARE_PROVIDER_SITE_OTHER): Payer: 59

## 2023-09-20 DIAGNOSIS — I639 Cerebral infarction, unspecified: Secondary | ICD-10-CM

## 2023-09-20 LAB — CUP PACEART REMOTE DEVICE CHECK
Date Time Interrogation Session: 20250423230154
Implantable Pulse Generator Implant Date: 20210303

## 2023-09-25 ENCOUNTER — Encounter: Payer: Self-pay | Admitting: Internal Medicine

## 2023-09-26 NOTE — Progress Notes (Signed)
 [facility] Summary Report / Loop Recorder

## 2023-10-25 ENCOUNTER — Ambulatory Visit (INDEPENDENT_AMBULATORY_CARE_PROVIDER_SITE_OTHER): Payer: 59

## 2023-10-25 DIAGNOSIS — I639 Cerebral infarction, unspecified: Secondary | ICD-10-CM | POA: Diagnosis not present

## 2023-10-25 LAB — CUP PACEART REMOTE DEVICE CHECK
Date Time Interrogation Session: 20250528230209
Implantable Pulse Generator Implant Date: 20210303

## 2023-10-30 NOTE — Progress Notes (Signed)
 [facility] Summary Report / Loop Recorder

## 2023-11-01 ENCOUNTER — Ambulatory Visit: Payer: Self-pay | Admitting: Internal Medicine

## 2023-11-17 DIAGNOSIS — H43393 Other vitreous opacities, bilateral: Secondary | ICD-10-CM | POA: Diagnosis not present

## 2023-11-26 ENCOUNTER — Ambulatory Visit: Payer: Self-pay | Admitting: Internal Medicine

## 2023-11-26 ENCOUNTER — Ambulatory Visit (INDEPENDENT_AMBULATORY_CARE_PROVIDER_SITE_OTHER)

## 2023-11-26 DIAGNOSIS — I639 Cerebral infarction, unspecified: Secondary | ICD-10-CM

## 2023-11-26 LAB — CUP PACEART REMOTE DEVICE CHECK
Date Time Interrogation Session: 20250629231312
Implantable Pulse Generator Implant Date: 20210303

## 2023-11-29 ENCOUNTER — Ambulatory Visit: Payer: 59

## 2023-12-13 NOTE — Progress Notes (Signed)
 [facility] Summary Report / Loop Recorder

## 2023-12-25 ENCOUNTER — Encounter: Payer: Self-pay | Admitting: Adult Health

## 2023-12-25 ENCOUNTER — Ambulatory Visit: Payer: Medicare Other | Admitting: Adult Health

## 2023-12-25 VITALS — BP 102/62 | HR 87 | Ht 64.0 in | Wt 163.2 lb

## 2023-12-25 DIAGNOSIS — G4733 Obstructive sleep apnea (adult) (pediatric): Secondary | ICD-10-CM

## 2023-12-25 DIAGNOSIS — I639 Cerebral infarction, unspecified: Secondary | ICD-10-CM

## 2023-12-25 DIAGNOSIS — G43109 Migraine with aura, not intractable, without status migrainosus: Secondary | ICD-10-CM | POA: Diagnosis not present

## 2023-12-25 MED ORDER — NURTEC 75 MG PO TBDP
75.0000 mg | ORAL_TABLET | ORAL | 11 refills | Status: AC | PRN
Start: 2023-12-25 — End: ?

## 2023-12-25 NOTE — Patient Instructions (Addendum)
 Your Plan:  Continue nightly use of CPAP for adequate sleep apnea management   Continue to follow with your DME for any needed supplies or CPAP related concerns   Start Nurtec as needed for migraine rescue, do not take more than 1 tablet in a 24 hour period   Continue aspirin  and Crestor  for secondary stroke prevention. Continue close follow up with your PCP for stroke risk factor management      Follow up in 1 year or call earlier if needed      Thank you for coming to see us  at [facility]. I hope we have been able to provide you high quality care today.  You may receive a patient satisfaction survey over the next few weeks. We would appreciate your feedback and comments so that we may continue to improve ourselves and the health of our patients.

## 2023-12-25 NOTE — Progress Notes (Signed)
 [facility] [address] [place]. [place] [address] [phone]       OFFICE FOLLOW UP NOTE  Tara [name] Date of Birth:  1965-11-17 Medical Record Number:  [id number]   Reason for visit: Stroke and CPAP follow-up    CHIEF COMPLAINT:  Chief Complaint  Patient presents with   Follow-up    Pt in room 8. Alone.Here for stroke/cpap follow up. Patient walks with cane, still has slight left side weakness. No falls. Reports doing well on cpap.      HPI:   Update 12/25/2023 JM: Patient returns for yearly follow-up visit.   Remains compliant with CPAP.  Over the past 30 days, 29/30 usage days with 26 days greater than 4-hour usage for 87% compliance.  Average usage 5 hours and 25 minutes.  Residual AHI 1.7.  Pressure in the 95th percentile 10.2.  Pressure setting of 6-14 with EPR 2.  Leaks in the 95th percentile 37.4.  Denies new stroke/TIA symptoms.  Reports continued cognitive impairment, mild left hemiparesis and gait impairment, overall stable.  Ambulates with walking stick, no recent falls.  Remains on disability.  Continues to follow with Dr. Corina for anxiety and adjustment disorder, feels anxiety has improved but can still easily feel overwhelmed, follow-up visit next month.  Reports compliance on aspirin  and Crestor .  Routinely follows with PCP Dr. Dayna for stroke risk factor management.  Loop recorder without evidence of A-fib thus far.  Per medication list, she is currently on rizatriptan as needed for migraine rescue, previously seen by Dr. Ines.  Reports overall improvement of migraines, only occurring about once per month.  She has not used rizatriptan recently, usually will lay down and migraine will eventually resolve.  Does report visual changes prior to migraine.     History provided for reference purposes only Update 12/25/2022 SS: here for revisit. Is established patient of [facility]. For CPAP, she falls asleep without putting on,  11/18/22-12/17/22 24/30 days 80% > 4 hours 57% 17 days. 6-14 cm water. Using FFM. AHI 1.0, leak 28.8, doesn't notice any leaking, except when turning to side. Isn't sure much difference on nights of CPAP use vs not. On aspirin  81 mg daily for stroke prevention, Crestor . PCP follows labs. On disability. Drives nearby. LR no episodes of AFIB. No health issues. Continues to have anxiety, cognitive impairment slow thought process/focus, follow with Dr. Corina. Slight weakness to left arm, leg, gait instability. Uses walking stick. Someone dropped her off. ESS 9.   Update 08/23/2021 JM: Patient returns for 65-month stroke follow-up.  Overall stable from stroke standpoint.  Reports residual occasional dizziness, cognitive impairment and anxiety - denies any worsening. Continues to ambulate with a walking stick, no recent falls.  She has finally been approved for social security disability which is a large stress reliever for her!!  She continues to follow with Dr. Corina for ongoing mood disorder/adjustment disorder with depression and anxiety post stroke, has f/u visit in May.  Denies new stroke/TIA symptoms.  Compliant on aspirin  and Crestor , denies side effects.  Blood pressure today 111/76. Loop recorder has not shown atrial fibrillation thus far.  Reports nightly use of CPAP. Reports improved tolerance since pressure setting adjustments at prior visit. Repeat download report showed improved usage with 27/30 usage days and 21 days greater than 4 hours for 70% compliance with residual AHI 1.0. No further concerns at this time.  Update 07/12/2021 JM: Patient returns for initial CPAP compliance visit.  CPAP initiated 04/29/2021.  Review  of compliance report from 06/12/2021 -07/11/2021 shows 27 out of 30 usage days with 17 days greater than 4 hours for 57% compliance.  Average usage is 4 hours and 36 minutes.  Residual AHI 1.6 with setting pressure from 6-16cm H2O and EPR level 3.  Pressure in the 93rd percentile  12.2.  Leaks in the 95th percentile 11.6.   Having some difficulty tolerating mask. She will wake up in the middle the night with the mask leaking and high pressure causing intolerance. Also reports dry mouth.  Currently using full facemask.  Initially tried nasal mask but was switched to full face mask due to mouth opening.  Epworth Sleepiness Scale 11.  Fatigue severity scale 53.   Update 12/27/2020 JM: Ms. Stobaugh returns for 46-month routine follow-up previously seen on 07/06/2020.  Overall stable.  Reports residual cognitive and gait impairment.  She remains in the process of applying for Social Security disability and currently on long-term disability through her former employer managed by PCP.  She continues to follow with Dr. Corina for mood disorder/adjustment disorder with depression and anxiety poststroke which continues to be beneficial.  Denies new stroke/TIA symptoms.  Compliant on aspirin  and Crestor  without associated side effects.  Blood pressure today 114/74.  Loop recorder has not shown atrial fibrillation thus far.  She has not yet received her CPAP machine reports previously being contacted by DME in April and was told they were still on back order and would be called when available.  Continues to follow with orthopedics for bilateral carpal tunnel and currently getting a quote from office for price of procedure. No new concerns at this time.  Update 07/06/2020 JM: Ms. Fredenburg returns for 65-month stroke follow-up.  Overall stable from stroke standpoint without new stroke/TIA symptoms and reports residual cognitive impairment and gait impairment. Use of cane at times - denies any recent falls.she also continues to experience anxiety and depression poststroke and has been followed by Dr. Corina which she believes has been beneficial.  She remains on long term disability and still in the process of applying for social security disability. Loop recorder has not shown atrial fibrillation thus far.     Underwent sleep study due to continued complaints of excessive daytime fatigue which was completed on 04/12/2020 which showed evidence of sleep apnea and recommend initiation of CPAP.   She is currently awaiting CPAP machine as this has been on back order.   She also underwent PFO closure successfully without complication on 04/19/2020 and recommended aspirin  and Plavix  for 81-month duration. She has remained on aspirin  and plavix  but does report increased bruising therefore started to take aspirin  81mg  every other day and has continued on plavix  daily - denies any concerning bleeding.  She has not further discuss this with cardiology.  Complains of left hand numbness with EMG/NCV completed 03/17/2020 to further evaluate for possible underlying carpal tunnel or post stroke deficit.  Study showed bilateral moderately to severe L>R carpal tunnel syndrome therefore referral placed to Ortho for further treatment options.  They plan on proceeding with release procedure but currently holding until she is able to come off Plavix  (around 09/2020). She did receive cortisone injections with improvement of severe pain (not waking her up at night) but continues to feel numbness. She also uses braces at night time as instructed by ortho.   She also reports episode last night of waking up with substernal pressure.  This pain did not radiate and denies any shortness of breath, nausea/vomiting, diaphoresis or  palpitations.  Symptoms improved after she propped 3 pillows behind her - reports lasting for approximately 3 hours.  She denies any reoccurrence or prior symptoms.  Update 03/01/2020 JM: Ms. Reever returns for 26-month stroke follow-up unaccompanied.  Residual deficits of cognitive impairment, dysarthria, imbalance and left hand weakness and numbness as well as continued excessive fatigue.  She recently completed speech therapy as she met stated rehab goals but due to continued cognitive impairment and poststroke  anxiety/depression, she has initial evaluation with Dr. Corina 10/13.  Cognition impairment with visual difficulty with high-level functioning, short-term recall, attention, processing and visual-spatial skills.  She also feels overwhelmed in crowded areas such as grocery store or needing to complete a more complex task.  She does report improvement of dizziness/imbalance with less frequency as before - will continue to feel with quick head movements and bending over.  Left hand paresthesias intermittent typically occurring with increased use of left hand or the middle of the night with burning sensation, numbness/tingling and achy feeling.  Symptoms present 3rd and 4th digit of left hand radiating in the middle of her wrist and stopping 1-2 below elbow.  Excessive daytime fatigue persists despite sleeping well through the night.  Does report occasional snoring.  She remains on long term disability and in the process of completing social security disability.  Remains on aspirin  and Crestor  for secondary stroke prevention without side effects.  Blood pressure today 119/84.  Loop recorder has not shown atrial fibrillation thus far.  No further concerns at this time.  Update 11/18/2019 JM: Ms. Facey returns for follow-up regarding cryptogenic right parietal stroke in 07/2019.  Residual deficits of mild cognitive impairment, left hand weakness, fatigue with decreased activity tolerance and dizziness/imbalance.  Continues to participate in speech therapy for impairments of attention, processing and visual-spatial skills as well as dysarthria with reported ongoing improvement.  Continues to work with PT for gait training, environmental scanning, and balance and dizziness progressing towards goals.  Continues to work with OT for improvement of LUE fine motor control and coordination and environmental standing/dynamic balance.  She does report ongoing difficulty concentrating especially when having multiple tasks to  complete.  Also reports difficulty going into crowded places such as stores as she feels visually overstimulated and starts to feel anxious.  She denies anxiety or depression but based on PHQ-9 of 16 likely underlying depression.  Becomes tearful during visit as she is frustrated with ongoing deficits and feels as though she should have recovered more than what she has.  Reports left hand numbness/tingling located left middle and ring finger with radiation to elbow can wake her up in the middle of the night or become present with increased hand use. 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  Remains on aspirin  and Crestor  for secondary stroke prevention. Recent lipid panel by PCP showed LDL 53.  Blood pressure today 113/78.  Loop recorder is not shown atrial fibrillation thus far.  No further concerns at this time.  Update 09/29/2019 Dr. Rosemarie ; patient is seen upon request that she call the office requesting medical leave be prolonged as she is having cognitive difficulties and fatigue and dizziness.  She states that she has been getting outpatient physical occupational therapy and but complains of a transient positional dizziness when she looks down or turns her neck quickly.  She is been getting some vestibular therapy which has been helping.  She was recently  referred to speech therapy for mental status slowness and progressing with appointment has not happened yet.  She feels overall there is slow and steady progress but she is not ready to return to work yet.  She wants to be out of work till her next assessment with her primary physician Dr. Cyrena which is scheduled for June 6.  She has not had any recurrent stroke or TIA symptoms.  She does complain of some intermittent numbness and tightness in her middle 2 fingers in the left hand particularly when she is tired.  She is tolerating aspirin  well without bruising or bleeding.  Blood  pressures well controlled today it is 116/77.  She had to reduce the dose of Crestor  to 20 mg that she had some muscle aches and pains which appear not to have improved.  She does take coenzyme Q 10 as well.  She denies any headache, slurred speech, increasing gait or balance problems.  Initial visit 08/13/2019 JM: Ms. Gaba is a 58 year old female who is being seen today for hospital follow-up.  Residual stroke deficits of mild left-sided weakness, dizziness with quick head movements and unsteadiness on feet. She does endorse some improvement but recently just started therapies. She does endorse increased fatigue since discharge and feels like her mind is slower/delayed.  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  She initially had FMLA which expired on 3/15 and she is requesting for extension.  She is eager to continue with therapies for ongoing improvement.  She continues on DAPT but will be completing 3-week Plavix  course in the near future.  Denies bleeding or bruising.  She continues on Crestor  40 mg daily but does endorse myalgias bilateral upper extremities and torso.  Blood pressure today 118/88.  Loop recorder has not shown atrial fibrillation thus far.  No further concerns at this time.  Stroke admission 07/29/2019: Ms. DARLETTA NOBLETT is a 58 y.o. female with history of HLD, migraine and asthma who presented with HA since 2/28 who woke w/ LUE numbness and weakness, severe dysarthria and dysphagia on 07/29/2019.  Evaluated by stroke team and Dr. Rosemarie with stroke work-up revealing 2 tiny right parietal infarcts embolic secondary to unknown source.  In addition to acute infarcts, MRI also showed several small chronic infarcts in the cerebellum but per review, Dr Rosemarie these were likely flow-voids and not old strokes.  TEE did not show evidence of PFO.  Recommended further evaluation with TCD which was suggestive of medium to large PFO.  Recommended further discussion  outpatient regarding possible PFO closure.  Loop recorder placed to rule out atrial fibrillation as possible cause of stroke.  Hypercoagulable work-up negative.  Recommended DAPT for 3 weeks and aspirin  alone.  LDL 151 and recommended increasing Crestor  frequency to 40 mg daily.  No history of HTN or DM.  Other stroke risk factors include migraines but no prior history of stroke.  Discharged home in stable condition without therapy needs.  Stroke:   2 tiny R parietal  infarcts embolic secondary to unknown source.SABRA PFO unclear if related to her stroke ROPE score 6 ) 62% chance pfo is related to stroke ) Code Stroke CT head hyperdense R MCA bifurcation. No acute abnormality. R maxillary sinus dz. ASPECTS 10.    CTA head & neck no LVO. Mild distal SMALL VESSEL DISEASE anterior and posterior circulation. Neck ok  CT perfusion Unremarkable  MRI  2 tiny acute cortical R brain (posterior  middle frontal gyrus and posterior R parietal lobe). Read as old cerebellar infarcts but  Dr. Rosemarie feels they are flow voids and not old stroke. Mild paranasal sinus dz.  2D Echo normal  TEE  PFO present EEG normal  LE dopplers no DVT  LDL 151 mg% HgbA1c 5.3 UDS not done  TSH normal    ROS:   14 system review of systems performed and negative with exception of those listed in HPI      PMH:  Past Medical History:  Diagnosis Date   Asthma    Hyperlipidemia    Migraine    Rotator cuff tear    Spondylolisthesis    L4-5    PSH:  Past Surgical History:  Procedure Laterality Date   BUBBLE STUDY  07/30/2019   Procedure: BUBBLE STUDY;  Surgeon: Kate Lonni CROME, MD;  Location: [facility] ENDOSCOPY;  Service: Cardiovascular;;   CARPAL TUNNEL RELEASE Left    October 27 2021   LOOP RECORDER INSERTION N/A 07/30/2019   Procedure: LOOP RECORDER INSERTION;  Surgeon: Waddell Danelle ORN, MD;  Location: [facility] INVASIVE CV LAB;  Service: Cardiovascular;  Laterality: N/A;   No prior surgery     PATENT FORAMEN OVALE(PFO)  CLOSURE N/A 04/19/2020   Procedure: PATENT FORAMEN OVALE (PFO) CLOSURE;  Surgeon: Wonda Sharper, MD;  Location: [facility] INVASIVE CV LAB;  Service: Cardiovascular;  Laterality: N/A;   TEE WITHOUT CARDIOVERSION N/A 07/30/2019   Procedure: TRANSESOPHAGEAL ECHOCARDIOGRAM (TEE);  Surgeon: Kate Lonni CROME, MD;  Location: [facility] ENDOSCOPY;  Service: Cardiovascular;  Laterality: N/A;    Social History:  Social History   Socioeconomic History   Marital status: Married    Spouse name: Not on file   Number of children: 2   Years of education: BSN   Highest education level: Not on file  Occupational History   Occupation: [employer]  Tobacco Use   Smoking status: Never   Smokeless tobacco: Never  Vaping Use   Vaping status: Never Used  Substance and Sexual Activity   Alcohol use: No    Alcohol/week: 0.0 standard drinks of alcohol    Comment: Rare   Drug use: No   Sexual activity: Yes    Birth control/protection: None  Other Topics Concern   Not on file  Social History Narrative   Lives at home w/ her husband and son   Right-handed   Caffeine: seldom, soda on the weekend   Social Drivers of Corporate investment banker Strain: Not on file  Food Insecurity: Not on file  Transportation Needs: Not on file  Physical Activity: Not on file  Stress: Not on file  Social Connections: Not on file  Intimate Partner Violence: Not on file    Family History:  Family History  Problem Relation Age of Onset   Heart disease Father 60       MI   Hypertension Maternal Grandmother    Hyperlipidemia Brother     Medications:   Current Outpatient Medications on File Prior to Visit  Medication Sig Dispense Refill   aspirin  EC 81 MG EC tablet Take 1 tablet (81 mg total) by mouth daily.     cholecalciferol (VITAMIN D ) 1000 UNITS tablet Take 1,000 Units by mouth daily.     Coenzyme Q10 (COQ10 PO) Take 1 tablet by mouth daily.      COVID-19 mRNA bivalent vaccine, Moderna, (MODERNA COVID-19 BIVAL  BOOSTER) 50 MCG/0.5ML injection Inject into the muscle. 0.5 mL 0   Omega-3 Fatty Acids (FISH  OIL) 500 MG CAPS Natures made Fish oil  pearls 500mg  BID 180 capsule 1   Probiotic Product (PROBIOTIC PO) Take 1 capsule by mouth daily.      rizatriptan (MAXALT) 10 MG tablet Take 10 mg by mouth as needed for migraine (headache).      rosuvastatin  (CRESTOR ) 20 MG tablet TAKE 1 TABLET BY MOUTH ONCE A DAY 90 tablet 1   scopolamine  (TRANSDERM-SCOP) 1 MG/3DAYS Place 1 patch onto the skin daily as needed (Motion sickness).      pantoprazole  (PROTONIX ) 40 MG tablet Take 1 tablet (40 mg total) by mouth daily. (Patient not taking: Reported on 12/25/2023) 30 tablet 0   No current facility-administered medications on file prior to visit.    Allergies:   Allergies  Allergen Reactions   Crestor  [Rosuvastatin ] Other (See Comments)    Muscle pain with 40 mg   Cyclobenzaprine  Other (See Comments)    loopy/ Can take 0.5 dose   Tape Rash    adhesive     Physical Exam  Vitals:   12/25/23 1402  BP: 102/62  Pulse: 87  Weight: 163 lb 3.2 oz (74 kg)  Height: 5' 4 (1.626 m)   Body mass index is 28.01 kg/m. No results found.  General: well developed, well nourished, pleasant middle-age female, seated, in no evident distress  Neurologic Exam Mental Status: Awake and fully alert.  Occasional decreased volume with occasional speech hesitancy.  Oriented to place and time. Recent memory impaired and remote memory intact. Attention span, concentration and fund of knowledge appropriate during visit. Mood and affect appropriate.   Cranial Nerves:  Pupils equal, briskly reactive to light. Extraocular movements full without nystagmus. Visual fields full to confrontation. Hearing intact. Decreased sensory left lower face. Left nasolabial fold flatting. tongue, palate moves normally and symmetrically.  Motor: Normal bulk and tone. Normal strength in all tested extremity muscles except mild diminished fine finger  movements on the left  Sensory.: slight decreased light touch, pinprick and vibratory sensation LUE and LLE compared to right side Coordination: Rapid alternating movements normal in all extremities except decreased left hand. Finger-to-nose and heel-to-shin performed accurately bilaterally. Gait and Station: Arises from chair without difficulty. Stance is normal. Gait demonstrates normal stride length with mild imbalance and unsteadiness and use of cane Reflexes: 1+ and symmetric. Toes downgoing.        ASSESSMENT/PLAN: Tara [name] is a 58 y.o. year old female with right parietal infarcts x2 on 07/29/2019 secondary to unknown source s/p loop recorder as well as evidence of PFO s/p closure 03/2020 with residual left hemisensory deficit, gait impairment, dizziness, vertigo impairment and anxiety/depression.  Vascular risk factors include HLD, migraines and OSA on CPAP.       R parietal strokes, cryptogenic - Residual left hemiparesis and cognitive impairment stable.  Remains on disability. -Continue to follow with Dr. Corina for poststroke anxiety/depression -Loop recorder has not shown atrial fibrillation thus far -Continue aspirin  and Crestor  for secondary stroke prevention measures managed/prescribed by PCP -Continue close PCP follow-up for aggressive stroke risk factor management including HLD with LDL goal<70    OSA on CPAP - Satisfactory compliance with optimal residual AHI -Continue AutoPap at 6-14 and EPR level 3 -Discussed importance of continued nightly compliance with ensuring greater than 4 hours per night for optimal benefit - DME [employer], continue to follow with DME needed supplies or CPAP related concerns - CPAP set up 04/2021, due for new machine 04/2026  Chronic migraines with aura -occurring approx 1/month  -  recommend starting Nurtec for rescue - Triptans contraindicated due to stroke history and advised to discontinue rizatriptan - Advised to call  with any increased migraine frequency      Follow-up in 1 year or call earlier if needed    CC:  Dayna Motto, DO    I personally spent a total of 40 minutes in the care of the patient today including preparing to see the patient, performing a medically appropriate exam/evaluation, counseling and educating, placing orders, and documenting clinical information in the EHR.   Harlene Bogaert, AGNP-BC  [facility] [address] [place], [place] [address]  Phone [phone] Fax [phone] Note: This document was prepared with digital dictation and possible smart phrase technology. Any transcriptional errors that result from this process are unintentional.

## 2023-12-26 NOTE — Progress Notes (Signed)
 [facility] Summary Report / Loop Recorder

## 2023-12-27 ENCOUNTER — Ambulatory Visit (INDEPENDENT_AMBULATORY_CARE_PROVIDER_SITE_OTHER)

## 2023-12-27 DIAGNOSIS — I639 Cerebral infarction, unspecified: Secondary | ICD-10-CM

## 2023-12-27 LAB — CUP PACEART REMOTE DEVICE CHECK
Date Time Interrogation Session: 20250730230347
Implantable Pulse Generator Implant Date: 20210303

## 2023-12-27 NOTE — Progress Notes (Signed)
 Desmen Schoffstall D, CMA  Joylene Bradley; Garcia, Patricia; Ziegler, Melissa; Tucker, Dolanda; Cain, Chelyan New orders have been placed for the above pt, DOB: 10-Feb-2066 Thanks

## 2023-12-28 ENCOUNTER — Ambulatory Visit: Payer: Self-pay | Admitting: Internal Medicine

## 2024-01-03 ENCOUNTER — Ambulatory Visit: Payer: 59

## 2024-01-08 ENCOUNTER — Encounter: Attending: Psychology | Admitting: Psychology

## 2024-01-08 DIAGNOSIS — R202 Paresthesia of skin: Secondary | ICD-10-CM | POA: Diagnosis not present

## 2024-01-08 DIAGNOSIS — F0630 Mood disorder due to known physiological condition, unspecified: Secondary | ICD-10-CM

## 2024-01-08 DIAGNOSIS — I69919 Unspecified symptoms and signs involving cognitive functions following unspecified cerebrovascular disease: Secondary | ICD-10-CM | POA: Diagnosis not present

## 2024-01-08 DIAGNOSIS — I69398 Other sequelae of cerebral infarction: Secondary | ICD-10-CM

## 2024-01-22 ENCOUNTER — Encounter: Payer: Self-pay | Admitting: Psychology

## 2024-01-22 NOTE — Progress Notes (Signed)
 Neuropsychology Visit  Patient:  Kataleia Quaranta   DOB: 08-22-1965  MR Number: [id number]  Location: [facility] [address] [place], [address] [place] [address] Dept: [phone]  Date of Service: 01/08/2024  Start: 2 PM End: 3 PM Today's visit was an in person visit that was conducted in my outpatient clinic office.  The patient and myself were present for this visit.  Today, a digital scribe was utilized to assist in notetaking and documentation and this process was explained to the patient patient consents to proceed and allow for utilization of a digital scribe.  Duration of Service: 1 Hour  Provider/Observer:     Norleen JONELLE Asa PsyD  Chief Complaint:      Chief Complaint  Patient presents with   Memory Loss   Cerebrovascular Accident   Anxiety   Depression    Reason For Service:      Lamoine A. Cardozo is a 58 year old female referred by Harlene Bogaert, NP with [facility] for neuropsychological consultation.  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  The patient had a cerebrovascular accident on 07/29/2019 and is continued to have difficulties with adjustment and residual cognitive deficits and changes, changes in mood and residual left-handed paresthesias.  She has significant fear about stroke and other vascular events with personal experience needs of her father dying of a heart attack when he was 64 years old and her aunt recently dying of a stroke at 48 years old.  During today's visit symptoms and history were reviewed.  The patient has a history of a stroke, which has resulted in ongoing cognitive and communication difficulties. They report a recent ophthalmology consultation last month for a new visual disturbance described as a fireball moving into the central visual field. The  ophthalmologist diagnosed a thickening of the vitreous gel in the left eye. No intervention was recommended due to the risk of retinal detachment outweighing the potential benefit.  The patient reports ongoing difficulties with cognitive processing, stating it takes a while to process thoughts and they feel grappled when given a lot of tasks. Expressive language is a continued issue, specifically with word-finding. The patient feels this has not improved or worsened, remaining static since the last visit. A significant issue is being interrupted during conversations, which causes difficulty resuming their train of thought. They often forget what they were saying entirely. This leads to feelings of being unheard or minimized.  The patient is utilizing previously discussed coping strategies, including writing things down and using digital calendars like Alexa. They are attempting to adapt to the cognitive changes.  Treatment Interventions:  Psychoeducation was provided regarding the experience of being interrupted and feeling minimized. Strategies were discussed for managing conversational interruptions. This included assertive communication techniques, such as asking the other person to wait, stating the need to finish a thought to avoid forgetting, and shifting the responsibility of remembering the topic back to the interrupter. Further education was provided on the nature of visual disturbances originating from within the eyeball (floaters) versus the brain, explaining the phenomenon of the object running away when trying to look at it directly. A coping strategy was taught to consciously shift focus to the dominant (right) eye to reduce the intensity of the visual disturbance from the left eye.  Participation Level:   Active  Participation Quality:  Appropriate      Behavioral Observation:  Well Groomed, Alert, and Appropriate.   Current Psychosocial Factors: The patient reports feeling pushed back  or that their ideas are not heard when interrupted, leading to feelings of being minimized. This is a source of frustration, particularly with people outside the immediate family. They tend to become a listener in conversations after being interrupted to avoid the struggle of recalling their thoughts.  Content of Session:   Reviewed ongoing cognitive challenges post-stroke, particularly word-finding and difficulty with cognitive set-shifting after interruptions. Explored the emotional impact of these interruptions, including feelings of being minimized. Discussed and practiced strategies for managing conversational interruptions assertively. Addressed new visual symptoms (vitreous floater) and provided psychoeducation and a coping strategy (shifting eye dominance) to manage the disturbance.  Effectiveness of Interventions:  The patient was receptive to the strategies discussed for managing interruptions and visual disturbances. They engaged in the experimental exercise to demonstrate the effect of shifting eye dominance and appeared to understand the rationale. They reported feeling that their thoughts and ideas are as valid as anyone else's but struggle with how to assert this in conversation.   Target Goals:   Improved coping and adjustment issues around residual effects of her CVA.  Goals Last Reviewed:   01/08/2024  Goals Addressed Today:    Session focused on ongoing post-stroke cognitive symptoms, specifically word-finding and processing speed. Addressed the social and emotional impact of conversational interruptions and introduced coping strategies. Psychoeducation was provided for a new visual disturbance (vitreous floater) and a coping technique was taught. The overarching goal of improving coping skills and not being surprised by predictable challenges was reinforced.   Impression/Diagnosis:   Norvel A. Zinger is a 58 year old female referred by Harlene Bogaert, NP with [facility] for neuropsychological consultation.  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  The patient had a cerebrovascular accident on 07/29/2019 and is continued to have difficulties with adjustment and residual cognitive deficits and changes, changes in mood and residual left-handed paresthesias.  She has significant fear about stroke and other vascular events with personal experience needs of her father dying of a heart attack when he was 50 years old and her aunt recently dying of a stroke at 60 years old.   The patient is clearly having adjustment difficulties with residual effects of her cerebrovascular accident including significant anxiety and depression which are exacerbating residual effects of her cerebrovascular accident that occurred in March of this year.  The patient has not been able to return to work.  The patient reports that her anxiety has improved considerably and some of the psychosocial variables and socioeconomic issues have improved more recently.  She is also having better interactions and more effective interactions with her husband.  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 Clinical Psychologist Neuropsychologist

## 2024-01-24 ENCOUNTER — Other Ambulatory Visit (HOSPITAL_COMMUNITY): Payer: Self-pay

## 2024-01-24 ENCOUNTER — Telehealth: Payer: Self-pay | Admitting: Pharmacist

## 2024-01-24 NOTE — Telephone Encounter (Signed)
 Pharmacy Patient Advocate Encounter  Received notification from [facility] that Prior Authorization for NURTEC 75 MG PO TBDP has been APPROVED from 01/24/2024 to 05/28/2024   PA #/Case ID/Reference #: [id number]

## 2024-01-24 NOTE — Telephone Encounter (Signed)
 Pharmacy Patient Advocate Encounter   Received notification from Patient Pharmacy that prior authorization for Nurtec 75MG  dispersible tablets is required/requested.   Insurance verification completed.   The patient is insured through [facility] .   Per test claim: PA required; PA submitted to above mentioned insurance via Latent Key/confirmation #/EOC [id number] Status is pending

## 2024-01-25 ENCOUNTER — Other Ambulatory Visit: Payer: Self-pay | Admitting: Adult Health

## 2024-01-25 ENCOUNTER — Other Ambulatory Visit: Payer: Self-pay

## 2024-01-25 DIAGNOSIS — G43109 Migraine with aura, not intractable, without status migrainosus: Secondary | ICD-10-CM

## 2024-01-25 DIAGNOSIS — I639 Cerebral infarction, unspecified: Secondary | ICD-10-CM

## 2024-01-28 ENCOUNTER — Ambulatory Visit (INDEPENDENT_AMBULATORY_CARE_PROVIDER_SITE_OTHER)

## 2024-01-28 DIAGNOSIS — I639 Cerebral infarction, unspecified: Secondary | ICD-10-CM

## 2024-01-30 LAB — CUP PACEART REMOTE DEVICE CHECK
Date Time Interrogation Session: 20250830230228
Implantable Pulse Generator Implant Date: 20210303

## 2024-01-31 ENCOUNTER — Ambulatory Visit: Payer: Medicare Other | Admitting: Psychology

## 2024-02-01 ENCOUNTER — Ambulatory Visit: Payer: Self-pay | Admitting: Internal Medicine

## 2024-02-04 NOTE — Progress Notes (Signed)
 Remote Loop Recorder Transmission

## 2024-02-07 ENCOUNTER — Ambulatory Visit: Payer: 59

## 2024-02-21 DIAGNOSIS — E559 Vitamin D deficiency, unspecified: Secondary | ICD-10-CM | POA: Diagnosis not present

## 2024-02-21 DIAGNOSIS — R21 Rash and other nonspecific skin eruption: Secondary | ICD-10-CM | POA: Diagnosis not present

## 2024-02-21 DIAGNOSIS — M722 Plantar fascial fibromatosis: Secondary | ICD-10-CM | POA: Diagnosis not present

## 2024-02-21 DIAGNOSIS — G4733 Obstructive sleep apnea (adult) (pediatric): Secondary | ICD-10-CM | POA: Diagnosis not present

## 2024-02-21 DIAGNOSIS — E7800 Pure hypercholesterolemia, unspecified: Secondary | ICD-10-CM | POA: Diagnosis not present

## 2024-02-21 DIAGNOSIS — I69359 Hemiplegia and hemiparesis following cerebral infarction affecting unspecified side: Secondary | ICD-10-CM | POA: Diagnosis not present

## 2024-02-25 NOTE — Progress Notes (Signed)
 Remote Loop Recorder Transmission

## 2024-02-28 ENCOUNTER — Ambulatory Visit (INDEPENDENT_AMBULATORY_CARE_PROVIDER_SITE_OTHER)

## 2024-02-28 DIAGNOSIS — I639 Cerebral infarction, unspecified: Secondary | ICD-10-CM | POA: Diagnosis not present

## 2024-02-28 LAB — CUP PACEART REMOTE DEVICE CHECK
Date Time Interrogation Session: 20251001230046
Implantable Pulse Generator Implant Date: 20210303

## 2024-03-02 ENCOUNTER — Ambulatory Visit: Payer: Self-pay | Admitting: Internal Medicine

## 2024-03-03 NOTE — Progress Notes (Signed)
 Remote Loop Recorder Transmission

## 2024-03-13 ENCOUNTER — Ambulatory Visit: Payer: 59

## 2024-03-25 ENCOUNTER — Other Ambulatory Visit: Payer: Self-pay | Admitting: Family Medicine

## 2024-03-25 DIAGNOSIS — Z1231 Encounter for screening mammogram for malignant neoplasm of breast: Secondary | ICD-10-CM

## 2024-03-31 ENCOUNTER — Ambulatory Visit

## 2024-03-31 DIAGNOSIS — I639 Cerebral infarction, unspecified: Secondary | ICD-10-CM | POA: Diagnosis not present

## 2024-03-31 LAB — CUP PACEART REMOTE DEVICE CHECK
Date Time Interrogation Session: 20251102230036
Implantable Pulse Generator Implant Date: 20210303

## 2024-04-02 NOTE — Progress Notes (Signed)
 Remote Loop Recorder Transmission

## 2024-04-03 ENCOUNTER — Ambulatory Visit: Payer: Self-pay | Admitting: Internal Medicine

## 2024-04-03 ENCOUNTER — Other Ambulatory Visit: Payer: Self-pay

## 2024-04-03 ENCOUNTER — Ambulatory Visit: Attending: Family Medicine

## 2024-04-03 DIAGNOSIS — R2681 Unsteadiness on feet: Secondary | ICD-10-CM | POA: Diagnosis present

## 2024-04-03 DIAGNOSIS — M722 Plantar fascial fibromatosis: Secondary | ICD-10-CM | POA: Insufficient documentation

## 2024-04-03 DIAGNOSIS — M79672 Pain in left foot: Secondary | ICD-10-CM | POA: Diagnosis present

## 2024-04-03 NOTE — Therapy (Signed)
 OUTPATIENT PHYSICAL THERAPY LOWER EXTREMITY EVALUATION   Patient Name: Tara [name] MRN: [id number] DOB:[birth year], 58 y.o., female Today's Date: 04/03/2024  END OF SESSION:  PT End of Session - 04/03/24 1109     Visit Number 1    Date for Recertification  05/28/24    PT Start Time 1110    PT Stop Time 1148    PT Time Calculation (min) 38 min          Past Medical History:  Diagnosis Date   Asthma    Hyperlipidemia    Migraine    Rotator cuff tear    Spondylolisthesis    L4-5   Past Surgical History:  Procedure Laterality Date   BUBBLE STUDY  07/30/2019   Procedure: BUBBLE STUDY;  Surgeon: Tara Lonni CROME, MD;  Location: [facility] ENDOSCOPY;  Service: Cardiovascular;;   CARPAL TUNNEL RELEASE Left    October 27 2021   LOOP RECORDER INSERTION N/A 07/30/2019   Procedure: LOOP RECORDER INSERTION;  Surgeon: Tara Danelle ORN, MD;  Location: [facility] INVASIVE CV LAB;  Service: Cardiovascular;  Laterality: N/A;   No prior surgery     PATENT FORAMEN OVALE(PFO) CLOSURE N/A 04/19/2020   Procedure: PATENT FORAMEN OVALE (PFO) CLOSURE;  Surgeon: Tara Sharper, MD;  Location: [facility] INVASIVE CV LAB;  Service: Cardiovascular;  Laterality: N/A;   TEE WITHOUT CARDIOVERSION N/A 07/30/2019   Procedure: TRANSESOPHAGEAL ECHOCARDIOGRAM (TEE);  Surgeon: Tara Lonni CROME, MD;  Location: [facility] ENDOSCOPY;  Service: Cardiovascular;  Laterality: N/A;   Patient Active Problem List   Diagnosis Date Noted   OSA on CPAP 12/25/2022   Cerebrovascular accident (CVA) due to embolism of right middle cerebral artery (HCC) 04/01/2020   PFO (patent foramen ovale) 04/01/2020   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   Snoring 04/01/2020   CVA (cerebral vascular accident) (HCC) 07/29/2019   Dysphagia 07/29/2019   Left hand weakness 07/29/2019   Palpitations 09/15/2015   Diastolic dysfunction 09/15/2015   Renal cyst, left 06/19/2013   History of migraine headaches 03/05/2013   Complicated migraine 03/05/2013    HLD (hyperlipidemia) 03/05/2013   Asthmatic bronchitis 03/05/2013   Rotator cuff syndrome 03/05/2013   Low back pain 03/05/2013    PCP: Tara Motto, DO  REFERRING PROVIDER: Dayna Motto, DO  REFERRING DIAG:  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 It feels like I am stepping on sharp rocks. Has been going on for several months. It feels better occasionally, but never really goes away. I have had it before the stroke for a while too. Hasn't done PT before and hasn't tried any of the injections either.   PERTINENT HISTORY: Hx of CVA with dysphagia PAIN:  Are you having pain? Yes: NPRS scale: 4-5/10 Pain location: L sole of foot Pain description: sharp, like rocks stabbing me in the morning; lingers (gets better but not fully) Aggravating factors: worse in the mornings, long distance with walking Relieving factors: not doing anything, resting and being off her feet  PRECAUTIONS: None  RED FLAGS: None   WEIGHT BEARING RESTRICTIONS: No  FALLS:  Has patient fallen in last 6 months? 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  DIAGNOSTIC FINDINGS: None relevant to condition  PATIENT SURVEYS:  PSFS: THE PATIENT SPECIFIC FUNCTIONAL SCALE  Place score of 0-10 (0 = unable to perform activity and 10 =  able to perform activity at the same level as before injury or problem)  Activity Date: 04/03/24    Gardening  5    2. Walks around the neighborhood/at the [employer]  0    3. Cleaning the house  3    Total Score 2.67      Total Score = Sum of activity scores/number of activities  Minimally Detectable Change: 3 points (for single activity); 2 points (for average score)  [name] (nd). The Patient Specific Functional Scale . Retrieved from [email or web address]   COGNITION: Overall cognitive status: Within functional limits for tasks assessed    PALPATION: TTP along medial and sole of foot towards L calcaneus  LOWER EXTREMITY ROM:  Active ROM Right eval Left eval  Hip flexion    Hip extension    Hip abduction    Hip adduction    Hip internal rotation    Hip external rotation    Knee flexion    Knee extension    Ankle dorsiflexion    Ankle plantarflexion    Ankle inversion    Ankle eversion     (Blank rows = not tested)  LOWER EXTREMITY MMT:  MMT Right eval Left eval  Hip flexion    Hip extension    Hip abduction    Hip adduction    Hip internal rotation    Hip external rotation    Knee flexion    Knee extension    Ankle dorsiflexion 5 4 *P  Ankle plantarflexion 5 3+ *P  Ankle inversion 5 4+ *P  Ankle eversion 5 4+   (Blank rows = not tested)  LOWER EXTREMITY SPECIAL TESTS:  Ankle special tests: Great toe extension test: positive (windlass)  FUNCTIONAL TESTS:  5 times sit to stand: 19.80 seconds SLS - RLE = 5 seconds; LLE = <1 second  GAIT: Distance walked: 25 ft Assistive device utilized: trekking pole (1) Level of assistance: SBA Comments: antalgic gait with decreased stance time on LLE d/t pain                                                                                                     TREATMENT DATE:    04/03/24 - Eval  PATIENT EDUCATION:  Education details: Educated pt on  mobility and stretches to help alleviate pain with fascia Person educated: Patient Education method: Explanation, Demonstration, and Handouts Education comprehension: verbalized understanding and returned demonstration  HOME EXERCISE PROGRAM: Access Code: [email or web address] URL: [email or web address] Date: 04/03/2024 Prepared by: Sherin Murdoch  Exercises - Gastroc Stretch on Wall  - 1 x daily -  7 x weekly - 4 reps - 30 hold - Soleus Stretch on Wall  - 3 x daily - 7 x weekly - 5 reps - 20 hold - Seated Plantar Fascia Stretch  - 1 x daily - 7 x weekly - 4 reps - 30 hold - Heel rises with counter support  - 1 x daily - 7 x weekly - 3 sets - 8 reps - Seated Plantar Fascia Mobilization with Small Ball  - 1 x daily - 7 x weekly - 3 sets - 10 reps  ASSESSMENT:  CLINICAL IMPRESSION: Patient is a 58 y.o. F who was seen today for physical therapy evaluation and treatment for L Plantar Fasciitis. Upon assessment, pt demos positive Windlass Test and pain along the attachment site of the plantar fascia. Also reports extreme pain when initially getting up in the AM. Educated on findings and HEP provided to try at home. Will attempt anti-inflammatory modalities during upcoming visits and gradually introduce mobility and strengthening interventions when appropriate. Would benefit from skilled PT to address the listed deficits above to improve overall functional mobility and participation in desired activities.   OBJECTIVE IMPAIRMENTS: Abnormal gait, decreased balance, difficulty walking, decreased strength, and pain.   ACTIVITY LIMITATIONS: standing, squatting, stairs, locomotion level, and walking  PARTICIPATION LIMITATIONS: cleaning, laundry, shopping, community activity, and yard work  PERSONAL FACTORS: Age and 1 comorbidity: Hx of R MCA CVA are also affecting patient's functional outcome.   REHAB POTENTIAL: Fair chronicity of the plantar fasciitis  CLINICAL DECISION MAKING:  Stable/uncomplicated  EVALUATION COMPLEXITY: Low   GOALS: Goals reviewed with patient? No  SHORT TERM GOALS: Target date: 04/28/24 Patient will be able to demonstrate independence with HEP provided Baseline:  Goal status: INITIAL Patient will report subjective improvements in symptoms of L foot pain to demonstrate progress in condition Baseline:  Goal status: INITIAL  LONG TERM GOALS: Target date: 05/28/24  Patient will be able to demonstrate independence with HEP provided Baseline:  Goal status: INITIAL Patient will improve their SLS to >12 seconds without falling or pain Baseline:  Goal status: INITIAL Patient will be able to improve score of PSFS by >2 points to demonstrate improvements in functional mobility, strength and balance. Baseline:  Goal status: INITIAL Patient will Improve 5xSTS to <12 seconds Baseline: Goal Status: INITIAL 5.  Patient will be able to walk with husband around neighborhood without increase in symptoms Baseline:  Goal status: INITIAL  6.  Patient will be able to perform all iADLs (gardening, cleaning, etc.) without increase in pain Baseline:  Goal status: INITIAL   PLAN:  PT FREQUENCY: 1x/week  PT DURATION: 8 weeks  PLANNED INTERVENTIONS: 97110-Therapeutic exercises, 97530- Therapeutic activity, [id number]- Neuromuscular re-education, 97535- Self Care, 02859- Manual therapy, [phone]- Gait training, [phone]- Electrical stimulation (unattended), [phone]- Electrical stimulation (manual), [id number]- Vasopneumatic device, [id number]- Ultrasound, [id number]- Ionotophoresis 4mg /ml Dexamethasone, [id number] (1-2 muscles), [id number] (3+ muscles)- Dry Needling, Patient/Family education, Balance training, Stair training, Taping, Joint mobilization, Cryotherapy, and Moist heat  PLAN FOR NEXT SESSION: Iontophoresis trial or Ultrasound; reduce inflammation; stretching and mobility; strengthening as tolerated; taping?   Makailyn Mccormick, PT, DPT 04/03/2024, 12:13 PM

## 2024-04-08 ENCOUNTER — Ambulatory Visit: Admitting: Physical Therapy

## 2024-04-08 DIAGNOSIS — M79672 Pain in left foot: Secondary | ICD-10-CM

## 2024-04-08 DIAGNOSIS — M722 Plantar fascial fibromatosis: Secondary | ICD-10-CM | POA: Diagnosis not present

## 2024-04-08 NOTE — Therapy (Signed)
 OUTPATIENT PHYSICAL THERAPY LOWER EXTREMITY    Patient Name: Beula Joyner MRN: [id number] DOB:September 06, 1965, 58 y.o., female Today's Date: 04/08/2024  END OF SESSION:  PT End of Session - 04/08/24 0855     Visit Number 2    Date for Recertification  05/28/24    PT Start Time 0853    PT Stop Time 0931    PT Time Calculation (min) 38 min          Past Medical History:  Diagnosis Date   Asthma    Hyperlipidemia    Migraine    Rotator cuff tear    Spondylolisthesis    L4-5   Past Surgical History:  Procedure Laterality Date   BUBBLE STUDY  07/30/2019   Procedure: BUBBLE STUDY;  Surgeon: Kate Lonni CROME, MD;  Location: [facility] ENDOSCOPY;  Service: Cardiovascular;;   CARPAL TUNNEL RELEASE Left    October 27 2021   LOOP RECORDER INSERTION N/A 07/30/2019   Procedure: LOOP RECORDER INSERTION;  Surgeon: Waddell Danelle ORN, MD;  Location: [facility] INVASIVE CV LAB;  Service: Cardiovascular;  Laterality: N/A;   No prior surgery     PATENT FORAMEN OVALE(PFO) CLOSURE N/A 04/19/2020   Procedure: PATENT FORAMEN OVALE (PFO) CLOSURE;  Surgeon: Wonda Sharper, MD;  Location: [facility] INVASIVE CV LAB;  Service: Cardiovascular;  Laterality: N/A;   TEE WITHOUT CARDIOVERSION N/A 07/30/2019   Procedure: TRANSESOPHAGEAL ECHOCARDIOGRAM (TEE);  Surgeon: Kate Lonni CROME, MD;  Location: [facility] ENDOSCOPY;  Service: Cardiovascular;  Laterality: N/A;   Patient Active Problem List   Diagnosis Date Noted   OSA on CPAP 12/25/2022   Cerebrovascular accident (CVA) due to embolism of right middle cerebral artery (HCC) 04/01/2020   PFO (patent foramen ovale) 04/01/2020   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   Snoring 04/01/2020   CVA (cerebral vascular accident) (HCC) 07/29/2019   Dysphagia 07/29/2019   Left hand weakness 07/29/2019   Palpitations 09/15/2015   Diastolic dysfunction 09/15/2015   Renal cyst, left 06/19/2013   History of migraine headaches 03/05/2013   Complicated migraine 03/05/2013   HLD  (hyperlipidemia) 03/05/2013   Asthmatic bronchitis 03/05/2013   Rotator cuff syndrome 03/05/2013   Low back pain 03/05/2013    PCP: Dayna Motto, DO  REFERRING PROVIDER: Dayna Motto, DO  REFERRING DIAG:  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 It feels like I am stepping on sharp rocks. Has been going on for several months. It feels better occasionally, but never really goes away. I have had it before the stroke for a while too. Hasn't done PT before and hasn't tried any of the injections either.   PERTINENT HISTORY: Hx of CVA with dysphagia PAIN:  Are you having pain? Yes: NPRS scale: 4-5/10 Pain location: L sole of foot Pain description: sharp, like rocks stabbing me in the morning; lingers (gets better but not fully) Aggravating factors: worse in the mornings, long distance with walking Relieving factors: not doing anything, resting and being off her feet  PRECAUTIONS: None  RED FLAGS: None   WEIGHT BEARING RESTRICTIONS: No  FALLS:  Has patient fallen in last 6 months? 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  DIAGNOSTIC FINDINGS: None relevant to condition  PATIENT SURVEYS:  PSFS: THE PATIENT SPECIFIC FUNCTIONAL SCALE  Place score of 0-10 (0 = unable to perform activity and 10 =  able to perform activity at the same level as before injury or problem)  Activity Date: 04/03/24    Gardening  5    2. Walks around the neighborhood/at the [employer]  0    3. Cleaning the house  3    Total Score 2.67      Total Score = Sum of activity scores/number of activities  Minimally Detectable Change: 3 points (for single activity); 2 points (for average score)  [name] (nd). The Patient Specific Functional Scale . Retrieved from [email or web address]   COGNITION: Overall cognitive status: Within functional limits for tasks assessed    PALPATION: TTP along medial and sole of foot towards L calcaneus  LOWER EXTREMITY ROM:  Active ROM Right eval Left eval  Hip flexion    Hip extension    Hip abduction    Hip adduction    Hip internal rotation    Hip external rotation    Knee flexion    Knee extension    Ankle dorsiflexion    Ankle plantarflexion    Ankle inversion    Ankle eversion     (Blank rows = not tested)  LOWER EXTREMITY MMT:  MMT Right eval Left eval  Hip flexion    Hip extension    Hip abduction    Hip adduction    Hip internal rotation    Hip external rotation    Knee flexion    Knee extension    Ankle dorsiflexion 5 4 *P  Ankle plantarflexion 5 3+ *P  Ankle inversion 5 4+ *P  Ankle eversion 5 4+   (Blank rows = not tested)  LOWER EXTREMITY SPECIAL TESTS:  Ankle special tests: Great toe extension test: positive (windlass)  FUNCTIONAL TESTS:  5 times sit to stand: 19.80 seconds SLS - RLE = 5 seconds; LLE = <1 second  GAIT: Distance walked: 25 ft Assistive device utilized: trekking pole (1) Level of assistance: SBA Comments: antalgic gait with decreased stance time on LLE d/t pain                                                                                                     TREATMENT DATE:   04/08/24 Self care- educ and discussed brace options or home taping,  rolling on frozen water bottle, stretching before standing, and benefits of stretching several times a day, proper footwear STW to Left PF and heel Stretching left toes,arch and calf US  to above 1.2 w/cm 2 100% cont KT tape to left foot for PF support   04/03/24 - Eval  PATIENT EDUCATION:  Education details: Educated pt on mobility and stretches to help alleviate pain with fascia Person educated: Patient Education method: Explanation, Demonstration, and Handouts Education comprehension: verbalized understanding and returned demonstration  HOME EXERCISE PROGRAM: Access Code: [email or web address] URL: [email or web address] Date: 04/03/2024 Prepared by: Jocelyn Stoner  Exercises - Gastroc Stretch on Wall  - 1 x daily - 7 x weekly - 4 reps - 30 hold - Soleus Stretch on Wall  - 3 x daily - 7 x weekly - 5 reps - 20 hold - Seated Plantar Fascia Stretch  - 1 x daily - 7 x weekly - 4 reps - 30 hold - Heel rises with counter support  - 1 x daily - 7 x weekly - 3 sets - 8 reps - Seated Plantar Fascia Mobilization with Small Ball  - 1 x daily - 7 x weekly - 3 sets - 10 reps  ASSESSMENT:  CLINICAL IMPRESSION: pnt arrives again late, encouraged her to try and be on time. Lots of time educ as noted above and answering all questions. STW,US  and KT tape. Pt felt better after session and felt tape very supportive.    Patient is a 58 y.o. F who was seen today for physical therapy evaluation and treatment for L Plantar Fasciitis. Upon assessment, pt demos positive Windlass Test and pain along the attachment site of the plantar fascia. Also reports extreme pain when initially getting up in the AM. Educated on findings and HEP provided to try at home. Will attempt anti-inflammatory modalities during upcoming visits and gradually introduce mobility and strengthening interventions when appropriate. Would benefit from skilled PT to address the listed deficits above to improve overall functional mobility and  participation in desired activities.   OBJECTIVE IMPAIRMENTS: Abnormal gait, decreased balance, difficulty walking, decreased strength, and pain.   ACTIVITY LIMITATIONS: standing, squatting, stairs, locomotion level, and walking  PARTICIPATION LIMITATIONS: cleaning, laundry, shopping, community activity, and yard work  PERSONAL FACTORS: Age and 1 comorbidity: Hx of R MCA CVA are also affecting patient's functional outcome.   REHAB POTENTIAL: Fair chronicity of the plantar fasciitis  CLINICAL DECISION MAKING: Stable/uncomplicated  EVALUATION COMPLEXITY: Low   GOALS: Goals reviewed with patient? No  SHORT TERM GOALS: Target date: 04/28/24 Patient will be able to demonstrate independence with HEP provided Baseline:  Goal status: 04/08/24 MET Patient will report subjective improvements in symptoms of L foot pain to demonstrate progress in condition Baseline:  Goal status: INITIAL  LONG TERM GOALS: Target date: 05/28/24  Patient will be able to demonstrate independence with HEP provided Baseline:  Goal status: INITIAL Patient will improve their SLS to >12 seconds without falling or pain Baseline:  Goal status: INITIAL Patient will be able to improve score of PSFS by >2 points to demonstrate improvements in functional mobility, strength and balance. Baseline:  Goal status: INITIAL Patient will Improve 5xSTS to <12 seconds Baseline: Goal Status: INITIAL 5.  Patient will be able to walk with husband around neighborhood without increase in symptoms Baseline:  Goal status: INITIAL  6.  Patient will be able to perform all iADLs (gardening, cleaning, etc.) without increase in pain Baseline:  Goal status: INITIAL   PLAN:  PT FREQUENCY: 1x/week  PT DURATION: 8 weeks  PLANNED INTERVENTIONS: 97110-Therapeutic exercises, 97530- Therapeutic activity, [id number]- Neuromuscular re-education, 97535- Self Care, 02859- Manual therapy, [id number]- Gait training, [phone]- Electrical stimulation  (unattended), [id number]- Electrical stimulation (manual), [id number]- Vasopneumatic device, [id number]- Ultrasound, [id number]- Ionotophoresis 4mg /ml Dexamethasone, 79439 (1-2 muscles), 20561 (3+ muscles)- Dry Needling, Patient/Family  education, Balance training, Stair training, Taping, Joint mobilization, Cryotherapy, and Moist heat  PLAN FOR NEXT SESSION: Iontophoresis trial or Ultrasound; reduce inflammation; stretching and mobility; strengthening as tolerated; ASSESS HEP and tape?   Berlene Dixson,ANGIE, PTA 04/08/2024, 8:56 AM

## 2024-04-16 ENCOUNTER — Ambulatory Visit
Admission: RE | Admit: 2024-04-16 | Discharge: 2024-04-16 | Disposition: A | Discharge status: Home / Self Care | Source: Ambulatory Visit | Attending: Family Medicine | Admitting: Family Medicine

## 2024-04-16 DIAGNOSIS — Z1231 Encounter for screening mammogram for malignant neoplasm of breast: Secondary | ICD-10-CM

## 2024-04-17 ENCOUNTER — Ambulatory Visit: Payer: 59

## 2024-04-18 ENCOUNTER — Other Ambulatory Visit: Payer: Self-pay

## 2024-04-18 ENCOUNTER — Ambulatory Visit

## 2024-04-18 DIAGNOSIS — M79672 Pain in left foot: Secondary | ICD-10-CM

## 2024-04-18 DIAGNOSIS — M722 Plantar fascial fibromatosis: Secondary | ICD-10-CM

## 2024-04-18 DIAGNOSIS — R2681 Unsteadiness on feet: Secondary | ICD-10-CM

## 2024-04-18 NOTE — Therapy (Signed)
 OUTPATIENT PHYSICAL THERAPY LOWER EXTREMITY    Patient Name: Tara [name] MRN: [id number] DOB:[birth year], 58 y.o., female Today's Date: 04/18/2024  END OF SESSION:  PT End of Session - 04/18/24 1007     Visit Number 3    Date for Recertification  05/28/24    PT Start Time 1007    PT Stop Time 1052    PT Time Calculation (min) 45 min    Activity Tolerance Patient tolerated treatment well    Behavior During Therapy [facility] for tasks assessed/performed           Past Medical History:  Diagnosis Date   Asthma    Hyperlipidemia    Migraine    Rotator cuff tear    Spondylolisthesis    L4-5   Past Surgical History:  Procedure Laterality Date   BUBBLE STUDY  07/30/2019   Procedure: BUBBLE STUDY;  Surgeon: Kate Lonni CROME, MD;  Location: [facility] ENDOSCOPY;  Service: Cardiovascular;;   CARPAL TUNNEL RELEASE Left    October 27 2021   LOOP RECORDER INSERTION N/A 07/30/2019   Procedure: LOOP RECORDER INSERTION;  Surgeon: Waddell Danelle ORN, MD;  Location: [facility] INVASIVE CV LAB;  Service: Cardiovascular;  Laterality: N/A;   No prior surgery     PATENT FORAMEN OVALE(PFO) CLOSURE N/A 04/19/2020   Procedure: PATENT FORAMEN OVALE (PFO) CLOSURE;  Surgeon: Wonda Sharper, MD;  Location: [facility] INVASIVE CV LAB;  Service: Cardiovascular;  Laterality: N/A;   TEE WITHOUT CARDIOVERSION N/A 07/30/2019   Procedure: TRANSESOPHAGEAL ECHOCARDIOGRAM (TEE);  Surgeon: Kate Lonni CROME, MD;  Location: [facility] ENDOSCOPY;  Service: Cardiovascular;  Laterality: N/A;   Patient Active Problem List   Diagnosis Date Noted   OSA on CPAP 12/25/2022   Cerebrovascular accident (CVA) due to embolism of right middle cerebral artery (HCC) 04/01/2020   PFO (patent foramen ovale) 04/01/2020   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   Snoring 04/01/2020   CVA (cerebral vascular accident) (HCC) 07/29/2019   Dysphagia 07/29/2019   Left hand weakness 07/29/2019   Palpitations 09/15/2015   Diastolic dysfunction 09/15/2015    Renal cyst, left 06/19/2013   History of migraine headaches 03/05/2013   Complicated migraine 03/05/2013   HLD (hyperlipidemia) 03/05/2013   Asthmatic bronchitis 03/05/2013   Rotator cuff syndrome 03/05/2013   Low back pain 03/05/2013    PCP: Dayna Motto, DO  REFERRING PROVIDER: Dayna Motto, DO  REFERRING DIAG:  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 It feels like I am stepping on sharp rocks. Has been going on for several months. It feels better occasionally, but never really goes away. I have had it before the stroke for a while too. Hasn't done PT before and hasn't tried any of the injections either.   PERTINENT HISTORY: Hx of CVA with dysphagia PAIN:  Are you having pain? Yes: NPRS scale: 4-5/10 Pain location: L sole of foot Pain description: sharp, like rocks stabbing me in the morning; lingers (gets better but not fully) Aggravating factors: worse in the mornings, long distance with walking Relieving factors: not doing anything, resting and being off her feet  PRECAUTIONS: None  RED FLAGS: None   WEIGHT BEARING RESTRICTIONS: No  FALLS:  Has patient fallen in last 6 months? 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  DIAGNOSTIC FINDINGS: None relevant to  condition  PATIENT SURVEYS:  PSFS: THE PATIENT SPECIFIC FUNCTIONAL SCALE  Place score of 0-10 (0 = unable to perform activity and 10 = able to perform activity at the same level as before injury or problem)  Activity Date: 04/03/24    Gardening  5    2. Walks around the neighborhood/at the [employer]  0    3. Cleaning the house  3    Total Score 2.67      Total Score = Sum of activity scores/number of activities  Minimally Detectable Change: 3 points (for single activity); 2 points (for average score)  [name] (nd). The Patient Specific Functional Scale . Retrieved from [email or web address]   COGNITION: Overall cognitive status: Within functional limits for tasks assessed    PALPATION: TTP along medial and sole of foot towards L calcaneus  LOWER EXTREMITY ROM:  Active ROM Right eval Left eval  Hip flexion    Hip extension    Hip abduction    Hip adduction    Hip internal rotation    Hip external rotation    Knee flexion    Knee extension    Ankle dorsiflexion    Ankle plantarflexion    Ankle inversion    Ankle eversion     (Blank rows = not tested)  LOWER EXTREMITY MMT:  MMT Right eval Left eval  Hip flexion    Hip extension    Hip abduction    Hip adduction    Hip internal rotation    Hip external rotation    Knee flexion    Knee extension    Ankle dorsiflexion 5 4 *P  Ankle plantarflexion 5 3+ *P  Ankle inversion 5 4+ *P  Ankle eversion 5 4+   (Blank rows = not tested)  LOWER EXTREMITY SPECIAL TESTS:  Ankle special tests: Great toe extension test: positive (windlass)  FUNCTIONAL TESTS:  5 times sit to stand: 19.80 seconds SLS - RLE = 5 seconds; LLE = <1 second  GAIT: Distance walked: 25 ft Assistive device utilized: trekking pole (1) Level of assistance: SBA Comments: antalgic gait with decreased stance time on LLE d/t pain                                                                                                      TREATMENT DATE:  04/18/24: 7 dorsiflexion L AROM Manual: A/P subtalar glides in long sitting L ankle while stretching into dorsiflexion Prone for myofascial release, cross friction massage L gastrocs and soleus, primarily medial head of gastroc Cross friction massage med inf calcaneal  tendinous attachments  Kinesiotaping L lower leg, 3 pieces, 2 extending from met heads to med and lat gastroc , and one extending across proximal calcaneus while bunching up fat pad.  Husband observed taping and took photos to replicate at home if needed Changed home ex routine, discontinued standing exercises currently due to quite tender , changed to black t band L ankle pumps , with stretch into dorsiflexion between active PF  04/08/24 Self care- educ and discussed brace options or home taping, rolling on frozen water bottle, stretching before standing, and benefits of stretching several times a day, proper footwear STW to Left PF and heel Stretching left toes,arch and calf US  to above 1.2 w/cm 2 100% cont KT tape to left foot for PF support   04/03/24 - Eval  PATIENT EDUCATION:  Education details: Educated pt on mobility and stretches to help alleviate pain with fascia Person educated: Patient Education method: Explanation, Demonstration, and Handouts Education comprehension: verbalized understanding and returned demonstration  HOME EXERCISE PROGRAM: Access Code: [email or web address] URL: [email or web address] Date: 04/03/2024 Prepared by: Jocelyn Stoner  Exercises - Gastroc Stretch on Wall  - 1 x daily - 7 x weekly - 4 reps - 30 hold - Soleus Stretch on Wall  - 3 x daily - 7 x weekly - 5 reps - 20 hold - Seated Plantar Fascia Stretch  - 1 x daily - 7 x weekly - 4 reps - 30 hold - Heel rises with counter support  - 1 x daily - 7 x weekly - 3 sets - 8 reps - Seated Plantar Fascia Mobilization with Small Ball  - 1 x daily - 7 x weekly - 3 sets - 10  reps  ASSESSMENT:  CLINICAL IMPRESSION: very tender, irritated at attachment of prox plantarfascia med calcaneus. Initially very stiff with dorsiflexion, much improved after manual techniques.  Less antalgia at end of session.  Needs assessment of standing, walking, navicular drop next visit to determine whether additional orthotic support would be beneficial  Patient is a 58 y.o. F who was seen today for physical therapy evaluation and treatment for L Plantar Fasciitis. Upon assessment, pt demos positive Windlass Test and pain along the attachment site of the plantar fascia. Also reports extreme pain when initially getting up in the AM. Educated on findings and HEP provided to try at home. Will attempt anti-inflammatory modalities during upcoming visits and gradually introduce mobility and strengthening interventions when appropriate. Would benefit from skilled PT to address the listed deficits above to improve overall functional mobility and participation in desired activities.   OBJECTIVE IMPAIRMENTS: Abnormal gait, decreased balance, difficulty walking, decreased strength, and pain.   ACTIVITY LIMITATIONS: standing, squatting, stairs, locomotion level, and walking  PARTICIPATION LIMITATIONS: cleaning, laundry, shopping, community activity, and yard work  PERSONAL FACTORS: Age and 1 comorbidity: Hx of R MCA CVA are also affecting patient's functional outcome.   REHAB POTENTIAL: Fair chronicity of the plantar fasciitis  CLINICAL DECISION MAKING: Stable/uncomplicated  EVALUATION COMPLEXITY: Low   GOALS: Goals reviewed with patient? No  SHORT TERM GOALS: Target date: 04/28/24 Patient will be able to demonstrate independence with HEP provided Baseline:  Goal status: 04/08/24 MET Patient will report subjective improvements in symptoms of L foot pain to demonstrate progress in condition Baseline:  Goal status: INITIAL  LONG TERM GOALS: Target date: 05/28/24  Patient will be able to  demonstrate independence with HEP provided Baseline:  Goal status: INITIAL Patient will improve their SLS to >12 seconds without falling or pain Baseline:  Goal status: INITIAL Patient will be able to improve score of PSFS by >2 points to demonstrate improvements in functional mobility, strength and balance. Baseline:  Goal status: INITIAL Patient will Improve 5xSTS to <12 seconds Baseline: Goal Status: INITIAL 5.  Patient will be able to walk with husband around neighborhood without increase in symptoms Baseline:  Goal status: INITIAL  6.  Patient will be able to perform all iADLs (gardening, cleaning, etc.) without increase in pain Baseline:  Goal status: INITIAL   PLAN:  PT FREQUENCY: 1x/week  PT DURATION: 8 weeks  PLANNED INTERVENTIONS: 97110-Therapeutic exercises, 97530- Therapeutic activity, [id number]- Neuromuscular re-education, 97535- Self Care, 02859- Manual therapy, [id number]- Gait training, [phone]- Electrical stimulation (unattended), [phone]- Electrical stimulation (manual), [id number]- Vasopneumatic device, [id number]- Ultrasound, [id number]- Ionotophoresis 4mg /ml Dexamethasone, 79439 (1-2 muscles), 20561 (3+ muscles)- Dry Needling, Patient/Family education, Balance training, Stair training, Taping, Joint mobilization, Cryotherapy, and Moist heat  PLAN FOR NEXT SESSION:reduce inflammation; stretching and mobility; strengthening as tolerated, progress with eccentric training  ASSESS HEP and tape?   Mckenzie Toruno L Milly Goggins, PT, DPT, OCS 04/18/2024, 11:13 AM

## 2024-05-01 ENCOUNTER — Ambulatory Visit

## 2024-05-01 ENCOUNTER — Ambulatory Visit: Attending: Family Medicine | Admitting: Physical Therapy

## 2024-05-01 DIAGNOSIS — M79672 Pain in left foot: Secondary | ICD-10-CM | POA: Insufficient documentation

## 2024-05-01 DIAGNOSIS — M722 Plantar fascial fibromatosis: Secondary | ICD-10-CM | POA: Insufficient documentation

## 2024-05-01 DIAGNOSIS — I639 Cerebral infarction, unspecified: Secondary | ICD-10-CM | POA: Diagnosis not present

## 2024-05-01 DIAGNOSIS — R2681 Unsteadiness on feet: Secondary | ICD-10-CM | POA: Insufficient documentation

## 2024-05-01 LAB — CUP PACEART REMOTE DEVICE CHECK
Date Time Interrogation Session: 20251203230152
Implantable Pulse Generator Implant Date: 20210303

## 2024-05-01 NOTE — Therapy (Signed)
 OUTPATIENT PHYSICAL THERAPY LOWER EXTREMITY    Patient Name: Tara [name] MRN: [id number] DOB:May 31, 1965, 58 y.o., female Today's Date: 05/01/2024  END OF SESSION:  PT End of Session - 05/01/24 1020     Visit Number 4    Date for Recertification  05/28/24    PT Start Time 1020    PT Stop Time 1100    PT Time Calculation (min) 40 min           Past Medical History:  Diagnosis Date   Asthma    Hyperlipidemia    Migraine    Rotator cuff tear    Spondylolisthesis    L4-5   Past Surgical History:  Procedure Laterality Date   BUBBLE STUDY  07/30/2019   Procedure: BUBBLE STUDY;  Surgeon: Kate Lonni CROME, MD;  Location: [facility] ENDOSCOPY;  Service: Cardiovascular;;   CARPAL TUNNEL RELEASE Left    October 27 2021   LOOP RECORDER INSERTION N/A 07/30/2019   Procedure: LOOP RECORDER INSERTION;  Surgeon: Waddell Danelle ORN, MD;  Location: [facility] INVASIVE CV LAB;  Service: Cardiovascular;  Laterality: N/A;   No prior surgery     PATENT FORAMEN OVALE(PFO) CLOSURE N/A 04/19/2020   Procedure: PATENT FORAMEN OVALE (PFO) CLOSURE;  Surgeon: Wonda Sharper, MD;  Location: [facility] INVASIVE CV LAB;  Service: Cardiovascular;  Laterality: N/A;   TEE WITHOUT CARDIOVERSION N/A 07/30/2019   Procedure: TRANSESOPHAGEAL ECHOCARDIOGRAM (TEE);  Surgeon: Kate Lonni CROME, MD;  Location: [facility] ENDOSCOPY;  Service: Cardiovascular;  Laterality: N/A;   Patient Active Problem List   Diagnosis Date Noted   OSA on CPAP 12/25/2022   Cerebrovascular accident (CVA) due to embolism of right middle cerebral artery (HCC) 04/01/2020   PFO (patent foramen ovale) 04/01/2020   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   Snoring 04/01/2020   CVA (cerebral vascular accident) (HCC) 07/29/2019   Dysphagia 07/29/2019   Left hand weakness 07/29/2019   Palpitations 09/15/2015   Diastolic dysfunction 09/15/2015   Renal cyst, left 06/19/2013   History of migraine headaches 03/05/2013   Complicated migraine 03/05/2013   HLD  (hyperlipidemia) 03/05/2013   Asthmatic bronchitis 03/05/2013   Rotator cuff syndrome 03/05/2013   Low back pain 03/05/2013    PCP: Dayna Motto, DO  REFERRING PROVIDER: Dayna Motto, DO  REFERRING DIAG:  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 Husband taped her Saturday and it is still on 2/10 pain    Pain in L foot that aches every time that she steps and uses it. It feels like I am stepping on sharp rocks. Has been going on for several months. It feels better occasionally, but never really goes away. I have had it before the stroke for a while too. Hasn't done PT before and hasn't tried any of the injections either.   PERTINENT HISTORY: Hx of CVA with dysphagia PAIN:  Are you having pain? Yes: NPRS scale: 2/10 Pain location: L sole of foot Pain description: sharp, like rocks stabbing me in the morning; lingers (gets better but not fully) Aggravating factors: worse in the mornings, long distance with walking Relieving factors: not doing anything, resting and being off her feet  PRECAUTIONS: None  RED FLAGS: None   WEIGHT BEARING RESTRICTIONS: No  FALLS:  Has patient fallen in last  6 months? 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  DIAGNOSTIC FINDINGS: None relevant to condition  PATIENT SURVEYS:  PSFS: THE PATIENT SPECIFIC  FUNCTIONAL SCALE  Place score of 0-10 (0 = unable to perform activity and 10 = able to perform activity at the same level as before injury or problem)  Activity Date: 04/03/24    Gardening  5    2. Walks around the neighborhood/at the [employer]  0    3. Cleaning the house  3    Total Score 2.67      Total Score = Sum of activity scores/number of activities  Minimally Detectable Change: 3 points (for single activity); 2 points (for average score)  [name] (nd). The Patient Specific Functional Scale . Retrieved from [email or web address]   COGNITION: Overall cognitive status: Within functional limits for tasks assessed    PALPATION: TTP along medial and sole of foot towards L calcaneus  LOWER EXTREMITY ROM:  Active ROM Right eval Left eval  Hip flexion    Hip extension    Hip abduction    Hip adduction    Hip internal rotation    Hip external rotation    Knee flexion    Knee extension    Ankle dorsiflexion    Ankle plantarflexion    Ankle inversion    Ankle eversion     (Blank rows = not tested)  LOWER EXTREMITY MMT:  MMT Right eval Left eval  Hip flexion    Hip extension    Hip abduction    Hip adduction    Hip internal rotation    Hip external rotation    Knee flexion    Knee extension    Ankle dorsiflexion 5 4 *P  Ankle plantarflexion 5 3+ *P  Ankle inversion 5 4+ *P  Ankle eversion 5 4+   (Blank rows = not tested)  LOWER EXTREMITY SPECIAL TESTS:  Ankle special tests: Great toe extension test: positive (windlass)  FUNCTIONAL TESTS:  5 times sit to stand: 19.80 seconds SLS - RLE = 5 seconds; LLE = <1 second  GAIT: Distance walked: 25 ft Assistive device utilized: trekking pole (1) Level of assistance: SBA Comments: antalgic gait with decreased stance time on LLE d/t pain                                                                                                     TREATMENT  DATE:   05/01/24 Nustep L 5 LE only Black bar DF and PF 2 set 10 Slant board 20 sec 3 x for calf stretch left foot on dyna disc wt bearing at tolerated 4 ways 10 x each  Left foot active DF 16 degrees Left  PF strength 4/5 with SL calf raise but limited ROM Left foot SLS 3-4 sec Red tband left ankle DF,Inv and Ev 10 x 2 sest Assessed navicular drop with standing and stepping and responds positively to upward support indicating drop in wt bearing- tape to support arch and navicular. She had had previous tape on for almost 2 weeks without break and some skin break down so changed tape and rec 3 days only and also rec orthotics and checking with [employer]     04/18/24: 7 dorsiflexion L AROM Manual: A/P subtalar glides in long sitting L ankle while stretching into dorsiflexion Prone for myofascial release, cross friction massage L gastrocs and soleus, primarily medial head of gastroc Cross friction massage med inf calcaneal tendinous attachments  Kinesiotaping L lower leg, 3 pieces, 2 extending from met heads to med and lat gastroc , and one extending across proximal calcaneus while bunching up fat pad.  Husband observed taping and took photos to replicate at home if needed Changed home ex routine, discontinued standing exercises currently due to quite tender , changed to black t band L ankle pumps , with stretch into dorsiflexion between active PF  04/08/24 Self care- educ and discussed brace options or home taping, rolling on frozen water bottle, stretching before standing, and benefits of stretching several times a day, proper footwear STW to Left PF and heel Stretching left toes,arch and calf US  to above 1.2 w/cm 2 100% cont KT tape to left foot for PF support   04/03/24 - Eval  PATIENT EDUCATION:  Education details: Educated pt on mobility and stretches to help alleviate pain with fascia Person educated: Patient Education method: Programmer, Multimedia, Demonstration, and  Handouts Education comprehension: verbalized understanding and returned demonstration  HOME EXERCISE PROGRAM: Access Code: [email or web address] URL: [email or web address] Date: 04/03/2024 Prepared by: Jocelyn Stoner  Exercises - Gastroc Stretch on Wall  - 1 x daily - 7 x weekly - 4 reps - 30 hold - Soleus Stretch on Wall  - 3 x daily - 7 x weekly - 5 reps - 20 hold - Seated Plantar Fascia Stretch  - 1 x daily - 7 x weekly - 4 reps - 30 hold - Heel rises with counter support  - 1 x daily - 7 x weekly - 3 sets - 8 reps - Seated Plantar Fascia Mobilization with Small Ball  - 1 x daily - 7 x weekly - 3 sets - 10 reps  ASSESSMENT:  CLINICAL IMPRESSION: pnt arrived 5 min late. Goals assessed and documented. 2/10 pain,husband taped me. Overall 50% better. Initiated ex for ROM and strength Assessed navicular drop with standing and stepping and responds positively to upward support indicating drop in wt bearing- tape to support arch and navicular. She had had previous tape on for almost 2 weeks without break and some skin break down so changed tape and rec 3 days only and also rec orthotics and checking with [employer] for orthotic      Patient is a 58 y.o. F who was seen today for physical therapy evaluation and treatment for L Plantar Fasciitis. Upon assessment, pt demos positive Windlass Test and pain along the attachment site of the plantar fascia. Also reports extreme pain when initially getting up in the AM. Educated on findings and HEP provided to try at home. Will attempt anti-inflammatory modalities during upcoming visits and gradually introduce mobility and strengthening interventions when appropriate. Would benefit from skilled PT to address the listed deficits above to improve overall functional  mobility and participation in desired activities.   OBJECTIVE IMPAIRMENTS: Abnormal gait, decreased balance, difficulty walking, decreased strength, and pain.   ACTIVITY LIMITATIONS:  standing, squatting, stairs, locomotion level, and walking  PARTICIPATION LIMITATIONS: cleaning, laundry, shopping, community activity, and yard work  PERSONAL FACTORS: Age and 1 comorbidity: Hx of R MCA CVA are also affecting patient's functional outcome.   REHAB POTENTIAL: Fair chronicity of the plantar fasciitis  CLINICAL DECISION MAKING: Stable/uncomplicated  EVALUATION COMPLEXITY: Low   GOALS: Goals reviewed with patient? No  SHORT TERM GOALS: Target date: 04/28/24 Patient will be able to demonstrate independence with HEP provided Baseline:  Goal status: 04/08/24 MET Patient will report subjective improvements in symptoms of L foot pain to demonstrate progress in condition Baseline:  Goal status: 05/01/24 MET  LONG TERM GOALS: Target date: 05/28/24  Patient will be able to demonstrate independence with HEP provided Baseline:  Goal status: INITIAL Patient will improve their SLS to >12 seconds without falling or pain Baseline:  Goal status: INITIAL Patient will be able to improve score of PSFS by >2 points to demonstrate improvements in functional mobility, strength and balance. Baseline:  Goal status: INITIAL Patient will Improve 5xSTS to <12 seconds Baseline: Goal Status: 05/01/24 18 sec with need of UE  5.  Patient will be able to walk with husband around neighborhood without increase in symptoms Baseline:  Goal status: 05/01/24 progressing  6.  Patient will be able to perform all iADLs (gardening, cleaning, etc.) without increase in pain Baseline:  Goal status: 05/01/24 progressing   PLAN:  PT FREQUENCY: 1x/week  PT DURATION: 8 weeks  PLANNED INTERVENTIONS: 97110-Therapeutic exercises, 97530- Therapeutic activity, 97112- Neuromuscular re-education, 97535- Self Care, 02859- Manual therapy, [phone]- Gait training, [phone]- Electrical stimulation (unattended), [phone]- Electrical stimulation (manual), [id number]- Vasopneumatic device, [id number]- Ultrasound, [id number]- Ionotophoresis  4mg /ml Dexamethasone, 79439 (1-2 muscles), 20561 (3+ muscles)- Dry Needling, Patient/Family education, Balance training, Stair training, Taping, Joint mobilization, Cryotherapy, and Moist heat  PLAN FOR NEXT SESSION:assess and progress  Cincere Zorn,ANGIE, PTA, 05/01/2024, 10:20 AM

## 2024-05-04 ENCOUNTER — Ambulatory Visit: Payer: Self-pay | Admitting: Internal Medicine

## 2024-05-06 ENCOUNTER — Ambulatory Visit

## 2024-05-06 DIAGNOSIS — M722 Plantar fascial fibromatosis: Secondary | ICD-10-CM

## 2024-05-06 DIAGNOSIS — M79672 Pain in left foot: Secondary | ICD-10-CM | POA: Diagnosis not present

## 2024-05-06 DIAGNOSIS — R2681 Unsteadiness on feet: Secondary | ICD-10-CM

## 2024-05-06 NOTE — Therapy (Signed)
 OUTPATIENT PHYSICAL THERAPY LOWER EXTREMITY    Patient Name: Mima Cranmore MRN: [id number] DOB:[birth year], 58 y.o., female Today's Date: 05/06/2024  END OF SESSION:  PT End of Session - 05/06/24 1156     Visit Number 5    Date for Recertification  05/28/24    Authorization Type [employer] Medicare    PT Start Time 1147    PT Stop Time 1235    PT Time Calculation (min) 48 min    Activity Tolerance Patient tolerated treatment well    Behavior During Therapy WFL for tasks assessed/performed         Past Medical History:  Diagnosis Date   Asthma    Hyperlipidemia    Migraine    Rotator cuff tear    Spondylolisthesis    L4-5   Past Surgical History:  Procedure Laterality Date   BUBBLE STUDY  07/30/2019   Procedure: BUBBLE STUDY;  Surgeon: Kate Lonni CROME, MD;  Location: [facility] ENDOSCOPY;  Service: Cardiovascular;;   CARPAL TUNNEL RELEASE Left    October 27 2021   LOOP RECORDER INSERTION N/A 07/30/2019   Procedure: LOOP RECORDER INSERTION;  Surgeon: Waddell Danelle ORN, MD;  Location: [facility] INVASIVE CV LAB;  Service: Cardiovascular;  Laterality: N/A;   No prior surgery     PATENT FORAMEN OVALE(PFO) CLOSURE N/A 04/19/2020   Procedure: PATENT FORAMEN OVALE (PFO) CLOSURE;  Surgeon: Wonda Sharper, MD;  Location: [facility] INVASIVE CV LAB;  Service: Cardiovascular;  Laterality: N/A;   TEE WITHOUT CARDIOVERSION N/A 07/30/2019   Procedure: TRANSESOPHAGEAL ECHOCARDIOGRAM (TEE);  Surgeon: Kate Lonni CROME, MD;  Location: [facility] ENDOSCOPY;  Service: Cardiovascular;  Laterality: N/A;   Patient Active Problem List   Diagnosis Date Noted   OSA on CPAP 12/25/2022   Cerebrovascular accident (CVA) due to embolism of right middle cerebral artery (HCC) 04/01/2020   PFO (patent foramen ovale) 04/01/2020   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   Snoring 04/01/2020   CVA (cerebral vascular accident) (HCC) 07/29/2019   Dysphagia 07/29/2019   Left hand weakness 07/29/2019   Palpitations 09/15/2015    Diastolic dysfunction 09/15/2015   Renal cyst, left 06/19/2013   History of migraine headaches 03/05/2013   Complicated migraine 03/05/2013   HLD (hyperlipidemia) 03/05/2013   Asthmatic bronchitis 03/05/2013   Rotator cuff syndrome 03/05/2013   Low back pain 03/05/2013   PCP: Dayna Motto, DO  REFERRING PROVIDER: Dayna Motto, DO  REFERRING DIAG:  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     Pain in L foot that aches every time that she steps and uses it. It feels like I am stepping on sharp rocks. Has been going on for several months. It feels better occasionally, but never really goes away. I have had it before the stroke for a while too. Hasn't done PT before and hasn't tried any of the injections either.   PERTINENT HISTORY: Hx of CVA with dysphagia PAIN:  Are you having pain? Yes: NPRS scale: 2/10 Pain location: L sole of foot Pain description: sharp, like rocks stabbing me in the morning; lingers (gets better but not fully) Aggravating factors: worse in the mornings, long distance with walking Relieving factors: not doing anything, resting and  being off her feet  PRECAUTIONS: None  RED FLAGS: None   WEIGHT BEARING RESTRICTIONS: No  FALLS:  Has patient fallen in last 6 months? 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  DIAGNOSTIC FINDINGS: None relevant to condition  PATIENT SURVEYS:  PSFS: THE PATIENT SPECIFIC FUNCTIONAL SCALE  Place score of 0-10 (0 = unable to perform activity and 10 = able to perform activity at the same level as before injury or problem)  Activity Date: 04/03/24    Gardening  5    2. Walks around the neighborhood/at the [employer]  0    3. Cleaning the house  3    Total Score 2.67      Total Score = Sum of activity scores/number of activities  Minimally Detectable Change: 3 points (for single activity); 2 points (for average score)  [name] (nd). The Patient Specific Functional Scale . Retrieved from [email or web address]   COGNITION: Overall cognitive status: Within functional limits for tasks assessed    PALPATION: TTP along medial and sole of foot towards L calcaneus  LOWER EXTREMITY ROM:  Active ROM Right eval Left eval  Hip flexion    Hip extension    Hip abduction    Hip adduction    Hip internal rotation    Hip external rotation    Knee flexion    Knee extension    Ankle dorsiflexion    Ankle plantarflexion    Ankle inversion    Ankle eversion     (Blank rows = not tested)  LOWER EXTREMITY MMT:  MMT Right eval Left eval  Hip flexion    Hip extension    Hip abduction    Hip adduction    Hip internal rotation    Hip external rotation    Knee flexion    Knee extension    Ankle dorsiflexion 5 4 *P  Ankle plantarflexion 5 3+ *P  Ankle inversion 5 4+ *P  Ankle eversion 5 4+   (Blank rows = not tested)  LOWER EXTREMITY SPECIAL TESTS:  Ankle special tests: Great toe extension test: positive (windlass)  FUNCTIONAL TESTS:  5 times sit to stand: 19.80 seconds SLS - RLE = 5 seconds; LLE = <1 second  GAIT: Distance walked: 25 ft Assistive device utilized: trekking pole (1) Level of assistance: SBA Comments: antalgic gait with  decreased stance time on LLE d/t pain                                                                                                     TREATMENT DATE:  05/06/24 NuStep L5 x 6 min Calf Stretch elevated board 2x20s Heel elevated calf raises on 4 step 2x15 Heel taps on  4 step 2x10 Reassessed SL stance Towel scrunches 2x10 Ball rolls under foot  Resisted dorsiflexion/plantarflexion/inversion/eversion Gtband 2x15 Taping to support longitudinal arch and heel   05/01/24 Nustep L 5 LE only Black bar DF and PF 2 set 10 Slant board 20 sec 3 x for calf stretch left foot on dyna disc wt bearing at tolerated 4 ways 10 x each  Left foot active DF 16 degrees Left PF strength 4/5 with SL calf raise but limited ROM Left foot SLS 3-4 sec Red tband left ankle DF,Inv and Ev 10 x 2 sest Assessed navicular drop with standing and stepping and responds positively to upward support indicating drop in wt bearing- tape to support arch and navicular. She had had previous tape on for almost 2 weeks without break and some skin break down so changed tape and rec 3 days only and also rec orthotics and checking with [employer]     04/18/24: 7 dorsiflexion L AROM Manual: A/P subtalar glides in long sitting L ankle while stretching into dorsiflexion Prone for myofascial release, cross friction massage L gastrocs and soleus, primarily medial head of gastroc Cross friction massage med inf calcaneal tendinous attachments  Kinesiotaping L lower leg, 3 pieces, 2 extending from met heads to med and lat gastroc , and one extending across proximal calcaneus while bunching up fat pad.  Husband observed taping and took photos to replicate at home if needed Changed home ex routine, discontinued standing exercises currently due to quite tender , changed to black t band L ankle pumps , with stretch into dorsiflexion between active PF  04/08/24 Self care- educ and discussed brace options or home taping, rolling on  frozen water bottle, stretching before standing, and benefits of stretching several times a day, proper footwear STW to Left PF and heel Stretching left toes,arch and calf US  to above 1.2 w/cm 2 100% cont KT tape to left foot for PF support   04/03/24 - Eval  PATIENT EDUCATION:  Education details: Educated pt on mobility and stretches to help alleviate pain with fascia Person educated: Patient Education method: Explanation, Demonstration, and Handouts Education comprehension: verbalized understanding and returned demonstration  HOME EXERCISE PROGRAM: Access Code: [email or web address] URL: [email or web address] Date: 04/03/2024 Prepared by: Jocelyn Stoner  Exercises - Gastroc Stretch on Wall  - 1 x daily - 7 x weekly - 4 reps - 30 hold - Soleus Stretch on Wall  - 3 x daily - 7 x weekly - 5 reps - 20 hold - Seated Plantar Fascia Stretch  - 1 x daily - 7 x weekly - 4 reps - 30 hold - Heel rises with counter support  - 1 x daily - 7 x weekly - 3 sets - 8 reps - Seated Plantar Fascia Mobilization with Small Ball  - 1 x daily - 7 x weekly - 3 sets - 10 reps  ASSESSMENT:  CLINICAL IMPRESSION: Patient exhibits deficits in SL stance activities and applying full pressure on LLE. Plan to progress weight shifting activities and SL stance activities to increase upright tolerance. Pt benefits from taping stating it helps with stability in standing and overall aids in movement quality. Pt taped today and responded well to activities with no increase in initial pain levels.    Patient is a 58 y.o. F who was seen today for physical therapy evaluation and treatment for L Plantar Fasciitis. Upon assessment, pt demos positive Windlass Test and pain along the attachment site of the plantar fascia. Also reports extreme pain  when initially getting up in the AM. Educated on findings and HEP provided to try at home. Will attempt anti-inflammatory modalities during upcoming visits and gradually introduce  mobility and strengthening interventions when appropriate. Would benefit from skilled PT to address the listed deficits above to improve overall functional mobility and participation in desired activities.   OBJECTIVE IMPAIRMENTS: Abnormal gait, decreased balance, difficulty walking, decreased strength, and pain.   ACTIVITY LIMITATIONS: standing, squatting, stairs, locomotion level, and walking  PARTICIPATION LIMITATIONS: cleaning, laundry, shopping, community activity, and yard work  PERSONAL FACTORS: Age and 1 comorbidity: Hx of R MCA CVA are also affecting patient's functional outcome.   REHAB POTENTIAL: Fair chronicity of the plantar fasciitis  CLINICAL DECISION MAKING: Stable/uncomplicated  EVALUATION COMPLEXITY: Low   GOALS: Goals reviewed with patient? No  SHORT TERM GOALS: Target date: 04/28/24 Patient will be able to demonstrate independence with HEP provided Baseline:  Goal status: 04/08/24 MET Patient will report subjective improvements in symptoms of L foot pain to demonstrate progress in condition Baseline:  Goal status: 05/01/24 MET  LONG TERM GOALS: Target date: 05/28/24  Patient will be able to demonstrate independence with HEP provided Baseline:  Goal status: INITIAL Patient will improve their SLS to >12 seconds without falling or pain Baseline:  Goal status: IN PROGRESS 8s RLE 5s LLE 05/06/24 Patient will be able to improve score of PSFS by >2 points to demonstrate improvements in functional mobility, strength and balance. Baseline:  Goal status: INITIAL Patient will Improve 5xSTS to <12 seconds Baseline: Goal Status: 05/01/24 18 sec with need of UE  5.  Patient will be able to walk with husband around neighborhood without increase in symptoms Baseline:  Goal status: 05/01/24 progressing  6.  Patient will be able to perform all iADLs (gardening, cleaning, etc.) without increase in pain Baseline:  Goal status: 05/01/24 progressing   PLAN:  PT FREQUENCY:  1x/week  PT DURATION: 8 weeks  PLANNED INTERVENTIONS: 97110-Therapeutic exercises, 97530- Therapeutic activity, 97112- Neuromuscular re-education, 97535- Self Care, 02859- Manual therapy, [phone]- Gait training, [phone]- Electrical stimulation (unattended), [phone]- Electrical stimulation (manual), [id number]- Vasopneumatic device, [id number]- Ultrasound, [id number]- Ionotophoresis 4mg /ml Dexamethasone, 79439 (1-2 muscles), 20561 (3+ muscles)- Dry Needling, Patient/Family education, Balance training, Stair training, Taping, Joint mobilization, Cryotherapy, and Moist heat  PLAN FOR NEXT SESSION:assess and progress  Thersia Alder, Student-PT, 05/06/2024, 12:57 PM

## 2024-05-07 ENCOUNTER — Telehealth: Payer: Self-pay

## 2024-05-07 DIAGNOSIS — I639 Cerebral infarction, unspecified: Secondary | ICD-10-CM

## 2024-05-07 DIAGNOSIS — G43109 Migraine with aura, not intractable, without status migrainosus: Secondary | ICD-10-CM

## 2024-05-07 NOTE — Progress Notes (Signed)
 Remote Loop Recorder Transmission

## 2024-05-07 NOTE — Telephone Encounter (Signed)
 Pharmacy Patient Advocate Encounter   Received notification from CoverMyMeds that prior authorization for Nurtec 75MG  dispersible tablets  is required/requested.   Insurance verification completed.   The patient is insured through [facility].   Prior Authorization form/request asks a question that requires your assistance. Please see the question below and advise accordingly. The PA will not be submitted until the necessary information is received.

## 2024-05-07 NOTE — Telephone Encounter (Signed)
 I called the patient and she states it helps not issues with taking the medication.

## 2024-05-08 ENCOUNTER — Other Ambulatory Visit (HOSPITAL_COMMUNITY): Payer: Self-pay

## 2024-05-08 NOTE — Telephone Encounter (Signed)
 Pharmacy Patient Advocate Encounter  Received notification from [facility] Medicare that Prior Authorization for Nurtec 75MG  dispersible tablets has been APPROVED from 05-08-2024 to 05-28-2025. Ran test claim, Copay is $784.37 for 16 tablets per 30 days. This test claim was processed through [facility]- copay amounts may vary at other pharmacies due to pharmacy/plan contracts, or as the patient moves through the different stages of their insurance plan.   PA #/Case ID/Reference #: [id number]

## 2024-05-08 NOTE — Telephone Encounter (Signed)
 Pharmacy Patient Advocate Encounter    Per test claim: PA required; PA submitted to above mentioned insurance via Latent Key/confirmation #/EOC [id number] Status is pending

## 2024-05-14 ENCOUNTER — Ambulatory Visit

## 2024-05-14 DIAGNOSIS — M722 Plantar fascial fibromatosis: Secondary | ICD-10-CM

## 2024-05-14 DIAGNOSIS — M79672 Pain in left foot: Secondary | ICD-10-CM

## 2024-05-14 DIAGNOSIS — R2681 Unsteadiness on feet: Secondary | ICD-10-CM

## 2024-05-14 NOTE — Therapy (Signed)
 OUTPATIENT PHYSICAL THERAPY LOWER EXTREMITY DISCHARGE   Patient Name: Tara [name] MRN: [id number] DOB:[birth year], 58 y.o., female Today's Date: 05/14/2024  END OF SESSION:  PT End of Session - 05/14/24 1321     Visit Number 6    Date for Recertification  07/09/24    Authorization Type U[employer] Medicare    PT Start Time 1315    PT Stop Time 1358    PT Time Calculation (min) 43 min    Activity Tolerance Patient tolerated treatment well    Behavior During Therapy WFL for tasks assessed/performed         Past Medical History:  Diagnosis Date   Asthma    Hyperlipidemia    Migraine    Rotator cuff tear    Spondylolisthesis    L4-5   Past Surgical History:  Procedure Laterality Date   BUBBLE STUDY  07/30/2019   Procedure: BUBBLE STUDY;  Surgeon: Kate Lonni CROME, MD;  Location: [facility] ENDOSCOPY;  Service: Cardiovascular;;   CARPAL TUNNEL RELEASE Left    October 27 2021   LOOP RECORDER INSERTION N/A 07/30/2019   Procedure: LOOP RECORDER INSERTION;  Surgeon: Waddell Danelle ORN, MD;  Location: [facility] INVASIVE CV LAB;  Service: Cardiovascular;  Laterality: N/A;   No prior surgery     PATENT FORAMEN OVALE(PFO) CLOSURE N/A 04/19/2020   Procedure: PATENT FORAMEN OVALE (PFO) CLOSURE;  Surgeon: Wonda Sharper, MD;  Location: [facility] INVASIVE CV LAB;  Service: Cardiovascular;  Laterality: N/A;   TEE WITHOUT CARDIOVERSION N/A 07/30/2019   Procedure: TRANSESOPHAGEAL ECHOCARDIOGRAM (TEE);  Surgeon: Kate Lonni CROME, MD;  Location: [facility] ENDOSCOPY;  Service: Cardiovascular;  Laterality: N/A;   Patient Active Problem List   Diagnosis Date Noted   OSA on CPAP 12/25/2022   Cerebrovascular accident (CVA) due to embolism of right middle cerebral artery (HCC) 04/01/2020   PFO (patent foramen ovale) 04/01/2020   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   Snoring 04/01/2020   CVA (cerebral vascular accident) (HCC) 07/29/2019   Dysphagia 07/29/2019   Left hand weakness 07/29/2019   Palpitations  09/15/2015   Diastolic dysfunction 09/15/2015   Renal cyst, left 06/19/2013   History of migraine headaches 03/05/2013   Complicated migraine 03/05/2013   HLD (hyperlipidemia) 03/05/2013   Asthmatic bronchitis 03/05/2013   Rotator cuff syndrome 03/05/2013   Low back pain 03/05/2013   PCP: Dayna Motto, DO  REFERRING PROVIDER: Dayna Motto, DO  REFERRING DIAG:  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     Pain in L foot that aches every time that she steps and uses it. It feels like I am stepping on sharp rocks. Has been going on for several months. It feels better occasionally, but never really goes away. I have had it before the stroke for a while too. Hasn't done PT before and hasn't tried any of the injections either.   PERTINENT HISTORY: Hx of CVA with dysphagia PAIN:  Are you having pain? Yes: NPRS scale: 2/10 Pain location: L sole of foot Pain description: sharp, like rocks stabbing me in the morning; lingers (gets better but not fully) Aggravating factors: worse in the mornings, long distance  with walking Relieving factors: not doing anything, resting and being off her feet  PRECAUTIONS: None  RED FLAGS: None   WEIGHT BEARING RESTRICTIONS: No  FALLS:  Has patient fallen in last 6 months? No  LIVING ENVIRONMENT: Lives with: lives with their spouse Lives in: House/apartment No steps ([place])  OCCUPATION: RN - Sports Coach with [employer] - walks a lot on concrete floors - now retired  PLOF: Independent  PATIENT GOALS: get back to gardening; less pain and better  mobility  NEXT MD VISIT:   OBJECTIVE:  Note: Objective measures were completed at Evaluation unless otherwise noted.  DIAGNOSTIC FINDINGS: None relevant to condition  PATIENT SURVEYS:  PSFS: THE PATIENT SPECIFIC FUNCTIONAL SCALE  Place score of 0-10 (0 = unable to perform activity and 10 = able to perform activity at the same level as before injury or problem)  Activity Date: 04/03/24 05/14/24   Gardening  5 7   2. Walks around the neighborhood/at the [employer]  0 8   3. Cleaning the house  3 7   Total Score 2.67      Total Score = Sum of activity scores/number of activities  Minimally Detectable Change: 3 points (for single activity); 2 points (for average score)  [name] (nd). The Patient Specific Functional Scale . Retrieved from [email or web address]   COGNITION: Overall cognitive status: Within functional limits for tasks assessed    PALPATION: TTP along medial and sole of foot towards L calcaneus  LOWER EXTREMITY ROM:  Active ROM Right eval Left eval  Hip flexion    Hip extension    Hip abduction    Hip adduction    Hip internal rotation    Hip external rotation    Knee flexion    Knee extension    Ankle dorsiflexion    Ankle plantarflexion    Ankle inversion    Ankle eversion     (Blank rows = not tested)  LOWER EXTREMITY MMT:  MMT Right eval Left eval  Hip flexion    Hip extension    Hip abduction    Hip adduction    Hip internal rotation    Hip external rotation    Knee flexion    Knee extension    Ankle dorsiflexion 5 4 *P  Ankle plantarflexion 5 3+ *P  Ankle inversion 5 4+ *P  Ankle eversion 5 4+   (Blank rows = not tested)  LOWER EXTREMITY SPECIAL TESTS:  Ankle special tests: Great toe extension test: positive (windlass)  FUNCTIONAL TESTS:  5 times sit to stand: 19.80 seconds SLS - RLE = 5 seconds; LLE = <1 second  GAIT: Distance walked: 25 ft Assistive device  utilized: trekking pole (1) Level of assistance: SBA Comments: antalgic gait with decreased stance time on LLE d/t pain                                                                                                     TREATMENT DATE:  05/14/24 NuStep L5 x Reassessment of goals  Elevated surface calf stretch  2x :20s Elevated calf raises 4 step 2x10 Resisted dorsiflexion Gtband 2x10 Demonstration of HEP Plantar Fasciitis taping (vertical strip on plantar aspect, strip across navicular bone to mitigate foot eversion, heel coverage for stability)  05/06/24 NuStep L5 x 6 min Calf Stretch elevated board 2x20s Heel elevated calf raises on 4 step 2x15 Heel taps on 4 step 2x10 Reassessed SL stance Towel scrunches 2x10 Ball rolls under foot  Resisted dorsiflexion/plantarflexion/inversion/eversion Gtband 2x15 Taping to support longitudinal arch and heel   05/01/24 Nustep L 5 LE only Black bar DF and PF 2 set 10 Slant board 20 sec 3 x for calf stretch left foot on dyna disc wt bearing at tolerated 4 ways 10 x each  Left foot active DF 16 degrees Left PF strength 4/5 with SL calf raise but limited ROM Left foot SLS 3-4 sec Red tband left ankle DF,Inv and Ev 10 x 2 sest Assessed navicular drop with standing and stepping and responds positively to upward support indicating drop in wt bearing- tape to support arch and navicular. She had had previous tape on for almost 2 weeks without break and some skin break down so changed tape and rec 3 days only and also rec orthotics and checking with [facility]     04/18/24: 7 dorsiflexion L AROM Manual: A/P subtalar glides in long sitting L ankle while stretching into dorsiflexion Prone for myofascial release, cross friction massage L gastrocs and soleus, primarily medial head of gastroc Cross friction massage med inf calcaneal tendinous attachments  Kinesiotaping L lower leg, 3 pieces, 2 extending from met heads to med and lat  gastroc , and one extending across proximal calcaneus while bunching up fat pad.  Husband observed taping and took photos to replicate at home if needed Changed home ex routine, discontinued standing exercises currently due to quite tender , changed to black t band L ankle pumps , with stretch into dorsiflexion between active PF  04/08/24 Self care- educ and discussed brace options or home taping, rolling on frozen water bottle, stretching before standing, and benefits of stretching several times a day, proper footwear STW to Left PF and heel Stretching left toes,arch and calf US  to above 1.2 w/cm 2 100% cont KT tape to left foot for PF support   04/03/24 - Eval  PATIENT EDUCATION:  Education details: Educated pt on mobility and stretches to help alleviate pain with fascia Person educated: Patient Education method: Explanation, Demonstration, and Handouts Education comprehension: verbalized understanding and returned demonstration  HOME EXERCISE PROGRAM: Access Code: [email or web address] URL: [email or web address] Date: 04/03/2024 Prepared by: Jocelyn Stoner  Exercises - Gastroc Stretch on Wall  - 1 x daily - 7 x weekly - 4 reps - 30 hold - Soleus Stretch on Wall  - 3 x daily - 7 x weekly - 5 reps - 20 hold - Seated Plantar Fascia Stretch  - 1 x daily - 7 x weekly - 4 reps - 30 hold - Heel rises with counter support  - 1 x daily - 7 x weekly - 3 sets - 8 reps - Seated Plantar Fascia Mobilization with Small Ball  - 1 x daily - 7 x weekly - 3 sets - 10 reps  ASSESSMENT:  CLINICAL IMPRESSION: Pt exhibits ability to maintain HEP independently with demonstration of HEP with added exercises. Pt reports subjective improvement in symptoms from baseline and possesses ability to progress currently without need for PT. Pt discharged today with education on HEP and progression without PT. Patient exhibits  decreased overall pain from baseline and increased functional capacity for ADLs. Pt still  exhibits deficits in SL stance activities and applying full pressure on LLE in SL stance. Pt taped today and responded well to activities with no increase in initial pain levels.    Patient is a 58 y.o. F who was seen today for physical therapy evaluation and treatment for L Plantar Fasciitis. Upon assessment, pt demos positive Windlass Test and pain along the attachment site of the plantar fascia. Also reports extreme pain when initially getting up in the AM. Educated on findings and HEP provided to try at home. Will attempt anti-inflammatory modalities during upcoming visits and gradually introduce mobility and strengthening interventions when appropriate. Would benefit from skilled PT to address the listed deficits above to improve overall functional mobility and participation in desired activities.   OBJECTIVE IMPAIRMENTS: Abnormal gait, decreased balance, difficulty walking, decreased strength, and pain.   ACTIVITY LIMITATIONS: standing, squatting, stairs, locomotion level, and walking  PARTICIPATION LIMITATIONS: cleaning, laundry, shopping, community activity, and yard work  PERSONAL FACTORS: Age and 1 comorbidity: Hx of R MCA CVA are also affecting patient's functional outcome.   REHAB POTENTIAL: Fair chronicity of the plantar fasciitis  CLINICAL DECISION MAKING: Stable/uncomplicated  EVALUATION COMPLEXITY: Low   GOALS: Goals reviewed with patient? No  SHORT TERM GOALS: Target date: 04/28/24 Patient will be able to demonstrate independence with HEP provided Baseline:  Goal status: 04/08/24 MET Patient will report subjective improvements in symptoms of L foot pain to demonstrate progress in condition Baseline:  Goal status: 05/01/24 MET  LONG TERM GOALS: Target date: 05/28/24  Patient will be able to demonstrate independence with HEP provided Baseline:  Goal status: MET 05/14/24  Patient will improve their SLS to >12 seconds without falling or pain Baseline:  Goal status:  IN PROGRESS 10s RLE 5s LLE 05/14/24  Patient will be able to improve score of PSFS by >2 points to demonstrate improvements in functional mobility, strength and balance. Baseline:  Goal status: GOAL MET 05/14/24  Patient will Improve 5xSTS to <12 seconds Baseline: Goal Status: 05/01/24 18 sec with need of UE; Unchanged 05/14/24  5.  Patient will be able to walk with husband around neighborhood without increase in symptoms  Baseline:  Goal status: MET 05/14/24 Able to do only has pain with static standing that is prolonged   6.  Patient will be able to perform all iADLs (gardening, cleaning, etc.) without increase in pain Baseline:  Goal status: MET able to do experiences some soreness following activity but not during 05/14/24  PHYSICAL THERAPY DISCHARGE SUMMARY  Visits from Start of Care: 6   Education / Equipment: HEP given    Patient agrees to discharge. Patient goals were partially met. Patient is being discharged due to being pleased with the current functional level.    PLAN:  PT FREQUENCY: 1x/week  PT DURATION: 8 weeks  PLANNED INTERVENTIONS: 97110-Therapeutic exercises, 97530- Therapeutic activity, [id number]- Neuromuscular re-education, 97535- Self Care, [id number]- Manual therapy, [phone]- Gait training, [phone]- Electrical stimulation (unattended), [phone]- Electrical stimulation (manual), [id number]- Vasopneumatic device, [id number]- Ultrasound, [id number]- Ionotophoresis 4mg /ml Dexamethasone, 79439 (1-2 muscles), [id number] (3+ muscles)- Dry Needling, Patient/Family education, Balance training, Stair training, Taping, Joint mobilization, Cryotherapy, and Moist heat  PLAN FOR NEXT SESSION:   Almetta Fam, PT, DPT Thersia Alder, Student-PT, 05/14/2024, 3:37 PM

## 2024-05-23 ENCOUNTER — Ambulatory Visit: Payer: 59

## 2024-06-01 ENCOUNTER — Ambulatory Visit: Attending: Cardiovascular Disease

## 2024-06-01 DIAGNOSIS — I639 Cerebral infarction, unspecified: Secondary | ICD-10-CM | POA: Diagnosis not present

## 2024-06-02 LAB — CUP PACEART REMOTE DEVICE CHECK
Date Time Interrogation Session: 20260103230217
Implantable Pulse Generator Implant Date: 20210303

## 2024-06-05 NOTE — Progress Notes (Signed)
 Remote Loop Recorder Transmission

## 2024-06-07 ENCOUNTER — Ambulatory Visit: Payer: Self-pay | Admitting: Cardiovascular Disease

## 2024-07-02 ENCOUNTER — Ambulatory Visit

## 2024-07-02 LAB — CUP PACEART REMOTE DEVICE CHECK
Date Time Interrogation Session: 20260203230247
Implantable Pulse Generator Implant Date: 20210303

## 2024-08-02 ENCOUNTER — Ambulatory Visit

## 2024-09-02 ENCOUNTER — Ambulatory Visit

## 2024-10-03 ENCOUNTER — Ambulatory Visit

## 2024-12-23 ENCOUNTER — Ambulatory Visit: Admitting: Adult Health
